# Patient Record
Sex: Female | Born: 1965
Health system: Southern US, Community
[De-identification: ages and names within clinical notes are randomized; demographics above are authoritative.]

## PROBLEM LIST (undated history)

## (undated) DIAGNOSIS — C801 Malignant (primary) neoplasm, unspecified: Secondary | ICD-10-CM

## (undated) DIAGNOSIS — F32A Depression, unspecified: Secondary | ICD-10-CM

## (undated) DIAGNOSIS — I341 Nonrheumatic mitral (valve) prolapse: Secondary | ICD-10-CM

## (undated) DIAGNOSIS — C50411 Malignant neoplasm of upper-outer quadrant of right female breast: Principal | ICD-10-CM

## (undated) DIAGNOSIS — T8859XA Other complications of anesthesia, initial encounter: Secondary | ICD-10-CM

## (undated) DIAGNOSIS — F419 Anxiety disorder, unspecified: Secondary | ICD-10-CM

## (undated) DIAGNOSIS — F329 Major depressive disorder, single episode, unspecified: Secondary | ICD-10-CM

## (undated) DIAGNOSIS — N390 Urinary tract infection, site not specified: Secondary | ICD-10-CM

## (undated) DIAGNOSIS — T4145XA Adverse effect of unspecified anesthetic, initial encounter: Secondary | ICD-10-CM

## (undated) DIAGNOSIS — T7840XA Allergy, unspecified, initial encounter: Secondary | ICD-10-CM

## (undated) DIAGNOSIS — K219 Gastro-esophageal reflux disease without esophagitis: Secondary | ICD-10-CM

## (undated) DIAGNOSIS — D219 Benign neoplasm of connective and other soft tissue, unspecified: Secondary | ICD-10-CM

## (undated) DIAGNOSIS — C50919 Malignant neoplasm of unspecified site of unspecified female breast: Secondary | ICD-10-CM

## (undated) DIAGNOSIS — B999 Unspecified infectious disease: Secondary | ICD-10-CM

## (undated) DIAGNOSIS — G709 Myoneural disorder, unspecified: Secondary | ICD-10-CM

## (undated) HISTORY — DX: Malignant neoplasm of upper-outer quadrant of right female breast: C50.411

## (undated) HISTORY — PX: OOPHORECTOMY: SHX86

## (undated) HISTORY — PX: ABDOMINAL HYSTERECTOMY: SHX81

## (undated) HISTORY — DX: Depression, unspecified: F32.A

## (undated) HISTORY — DX: Major depressive disorder, single episode, unspecified: F32.9

---

## 2002-12-11 HISTORY — PX: BREAST SURGERY: SHX581

## 2014-04-15 ENCOUNTER — Other Ambulatory Visit: Payer: Self-pay

## 2014-04-15 ENCOUNTER — Encounter (HOSPITAL_COMMUNITY): Payer: Self-pay | Admitting: Emergency Medicine

## 2014-04-15 ENCOUNTER — Emergency Department (HOSPITAL_COMMUNITY)
Admission: EM | Admit: 2014-04-15 | Discharge: 2014-04-15 | Disposition: A | Discharge status: Home / Self Care | Payer: Medicaid - Out of State | Attending: Emergency Medicine | Admitting: Emergency Medicine

## 2014-04-15 DIAGNOSIS — Z859 Personal history of malignant neoplasm, unspecified: Secondary | ICD-10-CM | POA: Insufficient documentation

## 2014-04-15 DIAGNOSIS — R109 Unspecified abdominal pain: Secondary | ICD-10-CM | POA: Insufficient documentation

## 2014-04-15 DIAGNOSIS — M545 Low back pain, unspecified: Secondary | ICD-10-CM | POA: Insufficient documentation

## 2014-04-15 DIAGNOSIS — R42 Dizziness and giddiness: Secondary | ICD-10-CM | POA: Insufficient documentation

## 2014-04-15 DIAGNOSIS — R55 Syncope and collapse: Secondary | ICD-10-CM | POA: Insufficient documentation

## 2014-04-15 DIAGNOSIS — R52 Pain, unspecified: Secondary | ICD-10-CM | POA: Insufficient documentation

## 2014-04-15 DIAGNOSIS — M5430 Sciatica, unspecified side: Secondary | ICD-10-CM | POA: Insufficient documentation

## 2014-04-15 HISTORY — DX: Malignant (primary) neoplasm, unspecified: C80.1

## 2014-04-15 LAB — BASIC METABOLIC PANEL
BUN: 7 mg/dL (ref 6–23)
CALCIUM: 9.6 mg/dL (ref 8.4–10.5)
CO2: 25 mEq/L (ref 19–32)
Chloride: 100 mEq/L (ref 96–112)
Creatinine, Ser: 0.58 mg/dL (ref 0.50–1.10)
GFR calc Af Amer: >90 mL/min (ref >90)
GFR calc non Af Amer: >90 mL/min (ref >90)
Glucose, Bld: 118 mg/dL — ABNORMAL HIGH (ref 70–99)
Potassium: 3.9 mEq/L (ref 3.7–5.3)
Sodium: 141 mEq/L (ref 137–147)

## 2014-04-15 LAB — CBC
HCT: 40.6 % (ref 36.0–46.0)
Hemoglobin: 13.9 g/dL (ref 12.0–15.0)
MCH: 31.1 pg (ref 26.0–34.0)
MCHC: 34.2 g/dL (ref 30.0–36.0)
MCV: 90.8 fL (ref 78.0–100.0)
PLATELETS: 325 10*3/uL (ref 150–400)
RBC: 4.47 MIL/uL (ref 3.87–5.11)
RDW: 13.5 % (ref 11.5–15.5)
WBC: 8.6 10*3/uL (ref 4.0–10.5)

## 2014-04-15 LAB — I-STAT TROPONIN, ED: Troponin i, poc: 0 ng/mL (ref 0.00–0.08)

## 2014-04-15 MED ORDER — PREDNISONE 20 MG PO TABS: ORAL_TABLET | ORAL | Status: DC

## 2014-04-15 MED ORDER — HYDROCODONE-ACETAMINOPHEN 5-325 MG PO TABS: 2.0000 | ORAL_TABLET | ORAL | Status: DC | PRN

## 2014-04-15 MED ORDER — IBUPROFEN 800 MG PO TABS: 800.0000 mg | ORAL_TABLET | Freq: Three times a day (TID) | ORAL | Status: DC

## 2014-04-15 NOTE — ED Provider Notes (Signed)
CSN: [id number]     Arrival date & time 04/15/14  1352 History   First MD Initiated Contact with Patient 04/15/14 1805     Chief Complaint  Patient presents with  . Loss of Consciousness     (Consider location/radiation/quality/duration/timing/severity/associated sxs/prior Treatment) HPI Comments: Patient presents to the ER for evaluation of syncope. Patient reports that she has had ongoing problems with left sided sciatica. Pain became suddenly very severe while she was waiting at the bus stop. She started to have dizziness, felt like she was going to pass out. She then noticed that she had some rumbling and discomfort in the stomach area. The next thing she knew, she woke up on the ground.  The patient reports that she did not suffer any head injury, has no headache. No vision change. No neck or back pain. Patient reports mild to moderate pain in the hip now, it is much improved. No further abdominal pain. She has not had any change in bowel or bladder function. No numbness, weakness in the lower extremities.  Patient is a 48 y.o. female presenting with syncope.  Loss of Consciousness   Past Medical History  Diagnosis Date  . Cancer    Past Surgical History  Procedure Laterality Date  . Abdominal hysterectomy     History reviewed. No pertinent family history. History  Substance Use Topics  . Smoking status: Never Smoker   . Smokeless tobacco: Not on file  . Alcohol Use: Yes   OB History   Grav Para Term Preterm Abortions TAB SAB Ect Mult Living                 Review of Systems  Cardiovascular: Positive for syncope.  Gastrointestinal: Positive for abdominal pain.  Musculoskeletal: Positive for back pain.  Neurological: Positive for syncope.  All other systems reviewed and are negative.     Allergies  Percocet  Home Medications   Prior to Admission medications   Medication Sig Start Date End Date Taking? Authorizing Provider  HYDROcodone-acetaminophen  (NORCO/VICODIN) 5-325 MG per tablet Take 2 tablets by mouth every 4 (four) hours as needed for moderate pain. 04/15/14   Orpah Greek, MD  ibuprofen (ADVIL,MOTRIN) 800 MG tablet Take 1 tablet (800 mg total) by mouth 3 (three) times daily. 04/15/14   Orpah Greek, MD  predniSONE (DELTASONE) 20 MG tablet 3 tabs po daily x 3 days, then 2 tabs x 3 days, then 1.5 tabs x 3 days, then 1 tab x 3 days, then 0.5 tabs x 3 days 04/15/14   Orpah Greek, MD   BP 118/70  Pulse 99  Temp(Src) 98 F (36.7 C) (Oral)  Resp 16  Wt 156 lb 9 oz (71.016 kg)  SpO2 99% Physical Exam  Constitutional: She is oriented to person, place, and time. She appears well-developed and well-nourished. No distress.  HENT:  Head: Normocephalic and atraumatic.  Right Ear: Hearing normal.  Left Ear: Hearing normal.  Nose: Nose normal.  Mouth/Throat: Oropharynx is clear and moist and mucous membranes are normal.  Eyes: Conjunctivae and EOM are normal. Pupils are equal, round, and reactive to light.  Neck: Normal range of motion. Neck supple.  Cardiovascular: Regular rhythm, S1 normal and S2 normal.  Exam reveals no gallop and no friction rub.   No murmur heard. Pulmonary/Chest: Effort normal and breath sounds normal. No respiratory distress. She exhibits no tenderness.  Abdominal: Soft. Normal appearance and bowel sounds are normal. There is no hepatosplenomegaly. There is no  tenderness. There is no rebound, no guarding, no tenderness at McBurney's point and negative Murphy's sign. No hernia.  Musculoskeletal: Normal range of motion.       Lumbar back: She exhibits tenderness. She exhibits no bony tenderness.       Back:  Neurological: She is alert and oriented to person, place, and time. She has normal strength. No cranial nerve deficit or sensory deficit. Coordination normal. GCS eye subscore is 4. GCS verbal subscore is 5. GCS motor subscore is 6.  Reflex Scores:      Patellar reflexes are 1+ on the  right side and 1+ on the left side. Skin: Skin is warm, dry and intact. No rash noted. No cyanosis.  Psychiatric: She has a normal mood and affect. Her speech is normal and behavior is normal. Thought content normal.    ED Course  Procedures (including critical care time) Labs Review Labs Reviewed  BASIC METABOLIC PANEL - Abnormal; Notable for the following:    Glucose, Bld 118 (*)    All other components within normal limits  CBC  I-STAT TROPOININ, ED    Imaging Review No results found.   EKG Interpretation None      Date: 04/15/2014  Rate: 82  Rhythm: normal sinus rhythm  QRS Axis: normal  Intervals: normal  ST/T Wave abnormalities: normal  Conduction Disutrbances:none     MDM   Final diagnoses:  Vasovagal syncope  Sciatica    Patient presents to the ER for evaluation of syncope. She had a syncopal episode today after experiencing sudden acute pain in her back. Her lab work is normal. Vital signs are normal. Neurologic evaluation is normal. No concern for head injury. I do not feel that she needs a CT. Cardiac Workup normal as well.  Symptoms are very consistent with a vasovagal syncope in the setting of acute pain. She does have symptoms are consistent with sciatica that has not been treated. She has normal strength, sensation and reflexes in the lower extremity. Imaging necessary. Patient was treated with Vicodin. She reports a history of itching with Percocet in the past, we'll try the Vicodin, stopped or any symptoms. Was also prescribed a prednisone taper.    Orpah Greek, MD 04/15/14 [phone]

## 2014-04-15 NOTE — ED Notes (Signed)
Per pt sts she had a syncopal episode at the bus stop today. Denies hitting head. sts right now she feels okay.

## 2014-04-15 NOTE — Discharge Instructions (Signed)
Sciatica °Sciatica is pain, weakness, numbness, or tingling along the path of the sciatic nerve. The nerve starts in the lower back and runs down the back of each leg. The nerve controls the muscles in the lower leg and in the back of the knee, while also providing sensation to the back of the thigh, lower leg, and the sole of your foot. Sciatica is a symptom of another medical condition. For instance, nerve damage or certain conditions, such as a herniated disk or bone spur on the spine, pinch or put pressure on the sciatic nerve. This causes the pain, weakness, or other sensations normally associated with sciatica. Generally, sciatica only affects one side of the body. °CAUSES  °· Herniated or slipped disc. °· Degenerative disk disease. °· A pain disorder involving the narrow muscle in the buttocks (piriformis syndrome). °· Pelvic injury or fracture. °· Pregnancy. °· Tumor (rare). °SYMPTOMS  °Symptoms can vary from mild to very severe. The symptoms usually travel from the low back to the buttocks and down the back of the leg. Symptoms can include: °· Mild tingling or dull aches in the lower back, leg, or hip. °· Numbness in the back of the calf or sole of the foot. °· Burning sensations in the lower back, leg, or hip. °· Sharp pains in the lower back, leg, or hip. °· Leg weakness. °· Severe back pain inhibiting movement. °These symptoms may get worse with coughing, sneezing, laughing, or prolonged sitting or standing. Also, being overweight may worsen symptoms. °DIAGNOSIS  °Your caregiver will perform a physical exam to look for common symptoms of sciatica. He or she may ask you to do certain movements or activities that would trigger sciatic nerve pain. Other tests may be performed to find the cause of the sciatica. These may include: °· Blood tests. °· X-rays. °· Imaging tests, such as an MRI or CT scan. °TREATMENT  °Treatment is directed at the cause of the sciatic pain. Sometimes, treatment is not necessary  and the pain and discomfort goes away on its own. If treatment is needed, your caregiver may suggest: °· Over-the-counter medicines to relieve pain. °· Prescription medicines, such as anti-inflammatory medicine, muscle relaxants, or narcotics. °· Applying heat or ice to the painful area. °· Steroid injections to lessen pain, irritation, and inflammation around the nerve. °· Reducing activity during periods of pain. °· Exercising and stretching to strengthen your abdomen and improve flexibility of your spine. Your caregiver may suggest losing weight if the extra weight makes the back pain worse. °· Physical therapy. °· Surgery to eliminate what is pressing or pinching the nerve, such as a bone spur or part of a herniated disk. °HOME CARE INSTRUCTIONS  °· Only take over-the-counter or prescription medicines for pain or discomfort as directed by your caregiver. °· Apply ice to the affected area for 20 minutes, 3 4 times a day for the first 48 72 hours. Then try heat in the same way. °· Exercise, stretch, or perform your usual activities if these do not aggravate your pain. °· Attend physical therapy sessions as directed by your caregiver. °· Keep all follow-up appointments as directed by your caregiver. °· Do not wear high heels or shoes that do not provide proper support. °· Check your mattress to see if it is too soft. A firm mattress may lessen your pain and discomfort. °SEEK IMMEDIATE MEDICAL CARE IF:  °· You lose control of your bowel or bladder (incontinence). °· You have increasing weakness in the lower back,   pelvis, buttocks, or legs.  You have redness or swelling of your back.  You have a burning sensation when you urinate.  You have pain that gets worse when you lie down or awakens you at night.  Your pain is worse than you have experienced in the past.  Your pain is lasting longer than 4 weeks.  You are suddenly losing weight without reason. MAKE SURE YOU:  Understand these  instructions.  Will watch your condition.  Will get help right away if you are not doing well or get worse. Document Released: 11/21/2001 Document Revised: 05/28/2012 Document Reviewed: 04/07/2012 [facility] Patient Information 2014 [place].  Syncope Syncope is a fainting spell. This means the person loses consciousness and drops to the ground. The person is generally unconscious for less than 5 minutes. The person may have some muscle twitches for up to 15 seconds before waking up and returning to normal. Syncope occurs more often in elderly people, but it can happen to anyone. While most causes of syncope are not dangerous, syncope can be a sign of a serious medical problem. It is important to seek medical care.  CAUSES  Syncope is caused by a sudden decrease in blood flow to the brain. The specific cause is often not determined. Factors that can trigger syncope include:  Taking medicines that lower blood pressure.  Sudden changes in posture, such as standing up suddenly.  Taking more medicine than prescribed.  Standing in one place for too long.  Seizure disorders.  Dehydration and excessive exposure to heat.  Low blood sugar (hypoglycemia).  Straining to have a bowel movement.  Heart disease, irregular heartbeat, or other circulatory problems.  Fear, emotional distress, seeing blood, or severe pain. SYMPTOMS  Right before fainting, you may:  Feel dizzy or lightheaded.  Feel nauseous.  See all white or all black in your field of vision.  Have cold, clammy skin. DIAGNOSIS  Your caregiver will ask about your symptoms, perform a physical exam, and perform electrocardiography (ECG) to record the electrical activity of your heart. Your caregiver may also perform other heart or blood tests to determine the cause of your syncope. TREATMENT  In most cases, no treatment is needed. Depending on the cause of your syncope, your caregiver may recommend changing or stopping  some of your medicines. HOME CARE INSTRUCTIONS  Have someone stay with you until you feel stable.  Do not drive, operate machinery, or play sports until your caregiver says it is okay.  Keep all follow-up appointments as directed by your caregiver.  Lie down right away if you start feeling like you might faint. Breathe deeply and steadily. Wait until all the symptoms have passed.  Drink enough fluids to keep your urine clear or pale yellow.  If you are taking blood pressure or heart medicine, get up slowly, taking several minutes to sit and then stand. This can reduce dizziness. SEEK IMMEDIATE MEDICAL CARE IF:   You have a severe headache.  You have unusual pain in the chest, abdomen, or back.  You are bleeding from the mouth or rectum, or you have black or tarry stool.  You have an irregular or very fast heartbeat.  You have pain with breathing.  You have repeated fainting or seizure-like jerking during an episode.  You faint when sitting or lying down.  You have confusion.  You have difficulty walking.  You have severe weakness.  You have vision problems. If you fainted, call your local emergency services (911 in [place]  Do not drive yourself to the hospital.  MAKE SURE YOU:  Understand these instructions.  Will watch your condition.  Will get help right away if you are not doing well or get worse. Document Released: 11/27/2005 Document Revised: 05/28/2012 Document Reviewed: 01/26/2012 [facility] Patient Information 2014 [place].

## 2014-09-29 ENCOUNTER — Inpatient Hospital Stay (HOSPITAL_COMMUNITY)
Admission: AD | Admit: 2014-09-29 | Discharge: 2014-09-29 | Disposition: A | Discharge status: Home / Self Care | Payer: Medicaid - Out of State | Source: Ambulatory Visit | Attending: Obstetrics & Gynecology | Admitting: Obstetrics & Gynecology

## 2014-09-29 ENCOUNTER — Encounter (HOSPITAL_COMMUNITY): Payer: Self-pay | Admitting: *Deleted

## 2014-09-29 DIAGNOSIS — Z90710 Acquired absence of both cervix and uterus: Secondary | ICD-10-CM | POA: Insufficient documentation

## 2014-09-29 DIAGNOSIS — K219 Gastro-esophageal reflux disease without esophagitis: Secondary | ICD-10-CM | POA: Insufficient documentation

## 2014-09-29 HISTORY — DX: Unspecified infectious disease: B99.9

## 2014-09-29 HISTORY — DX: Nonrheumatic mitral (valve) prolapse: I34.1

## 2014-09-29 HISTORY — DX: Anxiety disorder, unspecified: F41.9

## 2014-09-29 HISTORY — DX: Other complications of anesthesia, initial encounter: T88.59XA

## 2014-09-29 HISTORY — DX: Adverse effect of unspecified anesthetic, initial encounter: T41.45XA

## 2014-09-29 HISTORY — DX: Benign neoplasm of connective and other soft tissue, unspecified: D21.9

## 2014-09-29 LAB — URINALYSIS, ROUTINE W REFLEX MICROSCOPIC
BILIRUBIN URINE: NEGATIVE
Glucose, UA: NEGATIVE mg/dL
Hgb urine dipstick: NEGATIVE
Ketones, ur: NEGATIVE mg/dL
Leukocytes, UA: NEGATIVE
NITRITE: NEGATIVE
PH: 5.5 (ref 5.0–8.0)
Protein, ur: NEGATIVE mg/dL
Specific Gravity, Urine: >1.03 — ABNORMAL HIGH (ref 1.005–1.030)
Urobilinogen, UA: 0.2 mg/dL (ref 0.0–1.0)

## 2014-09-29 LAB — CBC WITH DIFFERENTIAL/PLATELET
Basophils Absolute: 0 10*3/uL (ref 0.0–0.1)
Basophils Relative: 0 % (ref 0–1)
EOS ABS: 0.1 10*3/uL (ref 0.0–0.7)
Eosinophils Relative: 1 % (ref 0–5)
HCT: 42.3 % (ref 36.0–46.0)
Hemoglobin: 14.5 g/dL (ref 12.0–15.0)
LYMPHS ABS: 2.4 10*3/uL (ref 0.7–4.0)
LYMPHS PCT: 24 % (ref 12–46)
MCH: 30.9 pg (ref 26.0–34.0)
MCHC: 34.3 g/dL (ref 30.0–36.0)
MCV: 90.2 fL (ref 78.0–100.0)
MONOS PCT: 5 % (ref 3–12)
Monocytes Absolute: 0.5 10*3/uL (ref 0.1–1.0)
NEUTROS PCT: 70 % (ref 43–77)
Neutro Abs: 6.7 10*3/uL (ref 1.7–7.7)
Platelets: 353 10*3/uL (ref 150–400)
RBC: 4.69 MIL/uL (ref 3.87–5.11)
RDW: 13.6 % (ref 11.5–15.5)
WBC: 9.7 10*3/uL (ref 4.0–10.5)

## 2014-09-29 LAB — COMPREHENSIVE METABOLIC PANEL
ALT: 16 U/L (ref 0–35)
ANION GAP: 13 (ref 5–15)
AST: 18 U/L (ref 0–37)
Albumin: 4.4 g/dL (ref 3.5–5.2)
Alkaline Phosphatase: 100 U/L (ref 39–117)
BILIRUBIN TOTAL: 0.8 mg/dL (ref 0.3–1.2)
BUN: 7 mg/dL (ref 6–23)
CHLORIDE: 100 meq/L (ref 96–112)
CO2: 27 meq/L (ref 19–32)
Calcium: 9.7 mg/dL (ref 8.4–10.5)
Creatinine, Ser: 0.56 mg/dL (ref 0.50–1.10)
GLUCOSE: 99 mg/dL (ref 70–99)
POTASSIUM: 4.2 meq/L (ref 3.7–5.3)
Sodium: 140 mEq/L (ref 137–147)
TOTAL PROTEIN: 8.5 g/dL — AB (ref 6.0–8.3)

## 2014-09-29 LAB — AMYLASE: Amylase: 60 U/L (ref 0–105)

## 2014-09-29 LAB — LIPASE, BLOOD: Lipase: 29 U/L (ref 11–59)

## 2014-09-29 MED ORDER — OMEPRAZOLE 20 MG PO CPDR: 20.0000 mg | DELAYED_RELEASE_CAPSULE | Freq: Every day | ORAL | Status: DC

## 2014-09-29 MED ORDER — GI COCKTAIL ~~LOC~~
30.0000 mL | Freq: Once | ORAL | Status: AC
  Administered 2014-09-29: 30 mL via ORAL
  Filled 2014-09-29: qty 30

## 2014-09-29 NOTE — MAU Provider Note (Signed)
History     CSN: [id number]  Arrival date and time: 09/29/14 1223   First Provider Initiated Contact with Patient 09/29/14 1304      Chief Complaint  Patient presents with  . Abdominal Pain   HPI Ms. Kallista Rumpf is a 48 y.o. [id number] who presents to MAU today with complaint of epigastric pain that comes and goes x weeks. She denies food triggers, fever, vaginal bleeding, discharge, UTI symptoms or N/V. She states that she occasionally feels acid in the back of her throat. The patient had hysterectomy without complications 2-3 years ago. She has not tried any medication for her pain.   OB History   Grav Para Term Preterm Abortions TAB SAB Ect Mult Living   2 2 1 1  0 0 0 0 0 2      Past Medical History  Diagnosis Date  . Cancer   . Complication of anesthesia     slow to wake up- BP was low, fainted next day  . MVP (mitral valve prolapse)   . Infection     UTI  . Anxiety   . Fibroid     Past Surgical History  Procedure Laterality Date  . Abdominal hysterectomy    . Breast surgery  2004    left lumpectomy    Family History  Problem Relation Age of Onset  . Hypertension Mother   . Stroke Mother   . Heart disease Father   . Stroke Father     History  Substance Use Topics  . Smoking status: Never Smoker   . Smokeless tobacco: Never Used  . Alcohol Use: Yes     Comment: 2-3/wk    Allergies:  Allergies  Allergen Reactions  . Percocet [Oxycodone-Acetaminophen] Itching    No prescriptions prior to admission    Review of Systems  Constitutional: Negative for fever and malaise/fatigue.  Gastrointestinal: Positive for abdominal pain. Negative for nausea and vomiting.  Genitourinary: Negative for dysuria, urgency and frequency.       Neg - vaginal bleeding, discharge   Physical Exam   Blood pressure 132/97, pulse 87, temperature 98.5 F (36.9 C), temperature source Oral, resp. rate 18, height 5\' 7"  (1.702 m), weight 156 lb (70.761 kg).  Physical Exam   Constitutional: She is oriented to person, place, and time. She appears well-developed and well-nourished. No distress.  HENT:  Head: Normocephalic.  Cardiovascular: Normal rate.   Respiratory: Effort normal.  GI: Soft. She exhibits no distension and no mass. There is tenderness (mild epigastric tenderness to palpation at midline, just below the xyphoid process). There is no rebound and no guarding.  Neurological: She is alert and oriented to person, place, and time.  Skin: Skin is warm and dry. No erythema.  Psychiatric: She has a normal mood and affect.   Results for orders placed during the hospital encounter of 09/29/14 (from the past 24 hour(s))  URINALYSIS, ROUTINE W REFLEX MICROSCOPIC     Status: Abnormal   Collection Time    09/29/14 12:45 PM      Result Value Ref Range   Color, Urine YELLOW  YELLOW   APPearance CLEAR  CLEAR   Specific Gravity, Urine >1.030 (*) 1.005 - 1.030   pH 5.5  5.0 - 8.0   Glucose, UA NEGATIVE  NEGATIVE mg/dL   Hgb urine dipstick NEGATIVE  NEGATIVE   Bilirubin Urine NEGATIVE  NEGATIVE   Ketones, ur NEGATIVE  NEGATIVE mg/dL   Protein, ur NEGATIVE  NEGATIVE mg/dL   Urobilinogen,  UA 0.2  0.0 - 1.0 mg/dL   Nitrite NEGATIVE  NEGATIVE   Leukocytes, UA NEGATIVE  NEGATIVE  CBC WITH DIFFERENTIAL     Status: None   Collection Time    09/29/14  1:25 PM      Result Value Ref Range   WBC 9.7  4.0 - 10.5 K/uL   RBC 4.69  3.87 - 5.11 MIL/uL   Hemoglobin 14.5  12.0 - 15.0 g/dL   HCT 42.3  36.0 - 46.0 %   MCV 90.2  78.0 - 100.0 fL   MCH 30.9  26.0 - 34.0 pg   MCHC 34.3  30.0 - 36.0 g/dL   RDW 13.6  11.5 - 15.5 %   Platelets 353  150 - 400 K/uL   Neutrophils Relative % 70  43 - 77 %   Neutro Abs 6.7  1.7 - 7.7 K/uL   Lymphocytes Relative 24  12 - 46 %   Lymphs Abs 2.4  0.7 - 4.0 K/uL   Monocytes Relative 5  3 - 12 %   Monocytes Absolute 0.5  0.1 - 1.0 K/uL   Eosinophils Relative 1  0 - 5 %   Eosinophils Absolute 0.1  0.0 - 0.7 K/uL   Basophils Relative  0  0 - 1 %   Basophils Absolute 0.0  0.0 - 0.1 K/uL  COMPREHENSIVE METABOLIC PANEL     Status: Abnormal   Collection Time    09/29/14  1:25 PM      Result Value Ref Range   Sodium 140  137 - 147 mEq/L   Potassium 4.2  3.7 - 5.3 mEq/L   Chloride 100  96 - 112 mEq/L   CO2 27  19 - 32 mEq/L   Glucose, Bld 99  70 - 99 mg/dL   BUN 7  6 - 23 mg/dL   Creatinine, Ser 0.56  0.50 - 1.10 mg/dL   Calcium 9.7  8.4 - 10.5 mg/dL   Total Protein 8.5 (*) 6.0 - 8.3 g/dL   Albumin 4.4  3.5 - 5.2 g/dL   AST 18  0 - 37 U/L   ALT 16  0 - 35 U/L   Alkaline Phosphatase 100  39 - 117 U/L   Total Bilirubin 0.8  0.3 - 1.2 mg/dL   GFR calc non Af Amer >90  >90 mL/min   GFR calc Af Amer >90  >90 mL/min   Anion gap 13  5 - 15    MAU Course  Procedures None  MDM GI cocktail given CBC, CMP, amylase and lipase today BP slightly elevated at time of discharge. Patient denies headache, blurred vision or chest pain. Denis history of HTN.  CVA and HTN warning signs discussed. Patient advised to go to [facility] immediately if symptoms arise  Assessment and Plan  A: Acid reflux  P: Discharge home Rx for Prilosec given to patient Patient advised that Prilosec is also available OTC if needed Amylase and lipase pending Patient given list of area resources for PCP and advised to establish care ASAP Patient may return to MAU as needed or if her condition were to change or worsen  Luvenia Redden, PA-C  09/29/2014, 2:30 PM

## 2014-09-29 NOTE — Discharge Instructions (Signed)
Food Choices for Gastroesophageal Reflux Disease When you have gastroesophageal reflux disease (GERD), the foods you eat and your eating habits are very important. Choosing the right foods can help ease the discomfort of GERD. WHAT GENERAL GUIDELINES DO I NEED TO FOLLOW?  Choose fruits, vegetables, whole grains, low-fat dairy products, and low-fat meat, fish, and poultry.  Limit fats such as oils, salad dressings, butter, nuts, and avocado.  Keep a food diary to identify foods that cause symptoms.  Avoid foods that cause reflux. These may be different for different people.  Eat frequent small meals instead of three large meals each day.  Eat your meals slowly, in a relaxed setting.  Limit fried foods.  Cook foods using methods other than frying.  Avoid drinking alcohol.  Avoid drinking large amounts of liquids with your meals.  Avoid bending over or lying down until 2-3 hours after eating. WHAT FOODS ARE NOT RECOMMENDED? The following are some foods and drinks that may worsen your symptoms: Vegetables Tomatoes. Tomato juice. Tomato and spaghetti sauce. Chili peppers. Onion and garlic. Horseradish. Fruits Oranges, grapefruit, and lemon (fruit and juice). Meats High-fat meats, fish, and poultry. This includes hot dogs, ribs, ham, sausage, salami, and bacon. Dairy Whole milk and chocolate milk. Sour cream. Cream. Butter. Ice cream. Cream cheese.  Beverages Coffee and tea, with or without caffeine. Carbonated beverages or energy drinks. Condiments Hot sauce. Barbecue sauce.  Sweets/Desserts Chocolate and cocoa. Donuts. Peppermint and spearmint. Fats and Oils High-fat foods, including [place] fries and potato chips. Other Vinegar. Strong spices, such as black pepper, white pepper, red pepper, cayenne, curry powder, cloves, ginger, and chili powder. The items listed above may not be a complete list of foods and beverages to avoid. Contact your dietitian for more  information. Document Released: 11/27/2005 Document Revised: 12/02/2013 Document Reviewed: 10/01/2013 [facility] Patient Information 2015 [place], [place]. This information is not intended to replace advice given to you by your health care provider. Make sure you discuss any questions you have with your health care provider.  [place] (Revised August 2014)   Chronic Pain Problems:    [facility]:  [phone]           Patients need to be referred by their primary care doctor/specialist  Insufficient Money for Medicine:           United Way: call "211"     MAP Program at [place] or HP [phone]            No Primary Care Doctor:  To locate a primary care doctor that accepts your insurance or provides certain services:           Reserve: [phone]           Physician Referral Service: [phone] ask for My [place]   If no insurance, you need to see if you qualify for [facility] orange card, call to set      up appointment for eligibility/enrollment at [phone] or [phone] or visit [place] ([address], [place] and [place]) to meet with a [facility] enrollment specialist.  Agencies that provide inexpensive (sliding fee scale) medical care:        [facility] - Family Medicine at [place] - [phone]      [facility] and [place] - [phone]  [facility] Internal Medicine - [phone]      [place] [phone]      [facility] [place] [phone]   [facility] - [place] @ [place] [phone]   [facility] - [place] @ [place] - [phone]   [facility] Family Practice: [phone]     Women's Clinic: [phone]    Planned Parenthood: [phone]    [facility] [place]    [place] Providers:           [facility] - [phone] (No Family Planning accepted)          [address], [phone], Mon-Fri 9am-5pm          [place] [phone]   [place], [address] [place], Mon-Thursday 8am-5pm, Fri 8am-noon   [place]          [address], Mon-Fri 7:30am-4:30pm          [place] - [phone]          [address] [facility] - [phone] [address]          Only accepts [place] [employer] patients after they have their name applied to their card  Self Pay (no insurance) in [facility]:           Sickle Cell Patients:    [place], [phone] [facility] Internal Medicine:   [address] [place] [phone]       [facility]   [address], [place] [phone]  [facility] Family Practice:   [address] [phone]          [facility]           [place], [phone] [facility] for [place], [phone]           [facility] Urgent [place]           [place] [address], [place] [address]           [phone], Mon-Fri 9am-7pm, [place] 9am-1pm          [facility] - Family Medicine @ [facility]          [place], [place]          [facility] - [facility]           [address], [place] [facility] and [place]   [address] [place] [phone]          [place], [place]  [facility] - [place]    [phone]  [place], [phone] [facility] - [place]   [address], [phone]  Dr. Vista Lawman           [address], [place], [facility]           [address] [phone]          [facility]             [address] [phone]          [facility]           [place], [place], 1st & 3rd Saturday every month, 10am-1pm  OTHERS:  [facility]  ([facility] Only)  [phone] (Thursday only)  Strategies for finding a Primary Care Provider:  1) Find a Doctor and Pay Out of Pocket  Although you won't have to find out who is covered by your insurance plan, it is a good idea to ask around and get recommendations. You will then need to call the office and see if the doctor you have chosen will accept you as a new patient and what types of options they offer for patients who are self-pay. Some doctors offer discounts or will set up payment plans for their patients who do not have insurance, but you will need to ask so you aren't surprised when you get to your appointment.  2) [place] - To see if you qualify for orange card access to healthcare safety net providers.  Call for appointment for eligibility/enrollment at [phone] or [phone]. (Uninsured, 0-200% FPL, qualifying info)  Applicants for [facility] are first required to see if they are eligible to enroll in the [facility] Marketplace before enrolling in [facility] (and get an exemption if they are not).  [place] Criteria for acceptance is:    Proof of Firefighter exemption - form or documentation    Valid photo ID (driver's license, state identification card, passport, home country ID)    Proof of [facility] residency (e.g.  drivers license, lease/landlord information, pay stubs with address, utility bill, bank statement, etc.)    Proof of income (1040, last year's tax return, W2, 4 current pay stubs, other income proof)    Proof of assets (current bank statement + 3 most recent, disability paperwork, life insurance info, tax value on autos, etc.)  3) [place] Department  Not all health departments have doctors that can see patients for sick visits, but many do, so it is worth a call to see if yours does. If you don't know where your local health department is, you can check in your phone book. The CDC also has a tool to help you locate your state's health department, and many state websites also have listings of all of their local health departments.  4) Find a [facility]  If your illness is not likely to be very severe or complicated, you may want to try a walk in clinic. These are popping up all over the country in pharmacies, drugstores, and shopping centers. They're usually staffed by nurse practitioners or physician assistants that have been trained to treat common illnesses and complaints. They're usually fairly quick and inexpensive. However, if you have serious medical issues or chronic medical problems, these are probably not your best option   STD Testing:  [place], [place] [facility]           [address], [place], phone [phone] or [phone]           Monday - Friday, call for an appointment          [place], [place] [facility]           [address], [place], phone [phone] or [phone]           Monday - Friday, call for an appointment Abuse/Neglect:           [place]: [place]: [phone] (After Hours)  Emergency Shelter:  [facility] [phone]  [place]-  [phone]  [place] - [phone]  Youth Focus - Act Together - [phone] (ages 25-17)  [place] @ [employer] - [phone]   Mammograms - Free at [facility] - [place]:           Room at the [place]: [phone]   (Homeless mother with children)          [place]: [phone] (Mothers only)   Youth Focus: [phone] (Pregnant 46-36 years old)   Adopt a Mom [phone]  [facility]    [facility]   [address] [place] [phone]          [facility]           [address] [place], [place], [place]          [facility] Dept.           [place], [place]          [facility]           [phone]          [facility] in [place]           [phone], [place]           [phone]           [phone] (After Hours)  [place] Abuse Resources:           Alcohol and Drug Services: [phone]           [facility]: [place]: [phone]    Narcotics Helpline - [phone]          [facility]: [phone]  Residential & Outpatient Substance Abuse Program - [place]: [phone]   [employer]  [place] - [phone] Psychological Services:          Reserve: [phone]    Therapeutic Alternatives: [phone]          [place]           [address]: [phone]     (24 Hour)   Mobile Crisis:    [place]:  Radio producer on [place] [phone] [facility] on [place] [phone]   Walk In [place]  ([place] - [phone] or [phone]  [place]. [place] [phone]  [place] [address] [place] [phone]   Dental Assistance:  If unable to pay or uninsured, contact: [facility]. to become qualified for the adult dental clinic. Patient must be enrolled in [employer] (uninsured, 0-200% FPL, qualifying info).  Enroll in [facility] first, then see Primary Care Physician assigned to you, the PCP makes a dental referral. [place] [facility] will receive referral and contacts patient for appointment.  Patients with Medicaid           [address] [place] (Children up to 73 + Pregnant Women) - [phone]  [place] - Suite [phone]  If unable to pay, or uninsured: contact [place] [phone] in [place] - ([place] only + Pregnant Women), [phone] in [place] only) to become qualified for the adult dental clinic  Must see if eligible to enroll in [place] before enrolling into the [facility] (exemption required) [phone] for an appointment)  [email or web address];   [phone].  If not eligible for ACA, then go by Department of Health and Human Services to see if eligible for orange card.  [address] [place].  Once you get an orange card, you will have a Primary Care home who will then refer you to dental if needed.        Other Personal assistant:   [facility] [phone] (ext [phone])   [address]  Dr. Donn Pierini - [phone]   [place]   [facility]           [place], [place], [place], [address]           [phone], Ext. [phone]           2nd and 4th Thursday of the month at 6:30am (Simple extractions only - no wisdom teeth or surgery) First come/First serve -First 10 clients served           [facility] [place], [place] and [facility] residents only)          [address], [place], [place], [address]           [phone]  [place]          [place]         [facility]          [phone]   Transportation Options:  Ambulance - 911 - [phone] per ride Family Member to accompany patient (if stable) - [place] - [phone]  PART - [phone]  Taxi - [phone] - [place] [phone] (Application required)  [facility] - [phone]

## 2014-09-29 NOTE — MAU Provider Note (Signed)
Attestation of Attending Supervision of Advanced Practitioner (PA/CNM/NP): Evaluation and management procedures were performed by the Advanced Practitioner under my supervision and collaboration.  I have reviewed the Advanced Practitioner's note and chart, and I agree with the management and plan.  Ashey Tramontana, MD, FACOG Attending Obstetrician & Gynecologist Faculty Practice, [facility]   

## 2014-09-29 NOTE — MAU Note (Signed)
Pain in upper abd.  Had this pain a few months ago, went away and now is back.   Has watery, soft bowels.  Doesn't really have an appetite. Is a sharp pain

## 2014-10-12 ENCOUNTER — Encounter (HOSPITAL_COMMUNITY): Payer: Self-pay | Admitting: *Deleted

## 2015-05-23 ENCOUNTER — Encounter (HOSPITAL_COMMUNITY): Payer: Self-pay

## 2015-05-23 ENCOUNTER — Emergency Department (HOSPITAL_COMMUNITY)
Admission: EM | Admit: 2015-05-23 | Discharge: 2015-05-23 | Disposition: A | Discharge status: Home / Self Care | Payer: BLUE CROSS/BLUE SHIELD | Attending: Emergency Medicine | Admitting: Emergency Medicine

## 2015-05-23 ENCOUNTER — Emergency Department (HOSPITAL_COMMUNITY): Payer: BLUE CROSS/BLUE SHIELD

## 2015-05-23 DIAGNOSIS — Z8679 Personal history of other diseases of the circulatory system: Secondary | ICD-10-CM | POA: Diagnosis not present

## 2015-05-23 DIAGNOSIS — M5412 Radiculopathy, cervical region: Secondary | ICD-10-CM | POA: Diagnosis not present

## 2015-05-23 DIAGNOSIS — Z79899 Other long term (current) drug therapy: Secondary | ICD-10-CM | POA: Insufficient documentation

## 2015-05-23 DIAGNOSIS — Z87440 Personal history of urinary (tract) infections: Secondary | ICD-10-CM | POA: Diagnosis not present

## 2015-05-23 DIAGNOSIS — M79602 Pain in left arm: Secondary | ICD-10-CM | POA: Diagnosis present

## 2015-05-23 DIAGNOSIS — Z853 Personal history of malignant neoplasm of breast: Secondary | ICD-10-CM | POA: Diagnosis not present

## 2015-05-23 DIAGNOSIS — Z8659 Personal history of other mental and behavioral disorders: Secondary | ICD-10-CM | POA: Diagnosis not present

## 2015-05-23 DIAGNOSIS — Z86018 Personal history of other benign neoplasm: Secondary | ICD-10-CM | POA: Insufficient documentation

## 2015-05-23 MED ORDER — PREDNISONE 20 MG PO TABS: ORAL_TABLET | ORAL | Status: DC

## 2015-05-23 NOTE — ED Provider Notes (Signed)
CSN: [id number]     Arrival date & time 05/23/15  1413 History   First MD Initiated Contact with Patient 05/23/15 1652    This chart was scribed for non-physician practitioner, Jamse Mead, [place], working with Pamella Pert, MD by Terressa Koyanagi, ED Scribe. This patient was seen in room TR10C/TR10C and the patient's care was started at 5:23 PM.  Chief Complaint  Patient presents with  . Arm Pain   The history is provided by the patient. No language interpreter was used.   PCP: Dr. Ronnald Ramp HPI Comments: Sandra James is a 49 y.o. female, with Hx of mitral valve prolapse, infection (UTI), anxiety, fibroid, cancer (breast cancer with left breast lumpectomy), and abd hysterectomy (consequently, no menstral periods), who presents to the Emergency Department complaining of ongoing, intermittent, atraumatic, shooting, worsening, radiating pain to left arm (starting at the left shoulder blade and radiating down the left arm to the fingers) onset 02/2015. Pt rates her pain a 7 out of 10.  Pt reports that she began to experience numbness and tingling to her left index, thumb, long finger this past March, however, recently the numbness/tingling progressed into intermittent episodes of pain. Pt reports she was seen by her PCP 2 days ago regarding the same whereby imaging was completed; she was Dx with muscle spasms in the neck; and started on ibuprofen and muscle relaxers with no relief. 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 Last menstrual period, patient reported that she had a  hysterectomy. PCP Dr. Raynelle Dick  Past Medical History  Diagnosis Date  . Complication of anesthesia     slow to wake up- BP was low, fainted next day  . MVP (mitral valve prolapse)   . Infection     UTI  . Anxiety   . Fibroid   . Cancer     left brast lumpectomy    Past Surgical History  Procedure Laterality Date  . Abdominal hysterectomy    . Breast surgery  2004    left lumpectomy   Family History  Problem Relation Age of Onset  . Hypertension Mother   . Stroke Mother   . Heart disease Father   . Stroke Father    History  Substance Use Topics  . Smoking status: Never Smoker   . Smokeless tobacco: Never Used  . Alcohol Use: Yes     Comment: 2-3/wk   OB History    Gravida Para Term Preterm AB TAB SAB Ectopic Multiple Living   2 2 1 1  0 0 0 0 0 2     Review of Systems  Constitutional: Negative for fever and chills.  Eyes: Negative for visual disturbance.  Respiratory: Negative for shortness of breath.   Cardiovascular: Negative for chest pain.  Musculoskeletal: Positive for arthralgias (left shoulder). Negative for neck pain and neck stiffness.       Left arm pain  Skin: Negative for color change and pallor.  Neurological: Positive for numbness (left arm). Negative for dizziness and syncope.       Denies complete loss of sensation to UE.  Allergies  Percocet  Home Medications   Prior to Admission medications   Medication Sig Start Date End Date Taking? Authorizing Provider  omeprazole (PRILOSEC) 20 MG capsule Take 1 capsule (20 mg total) by mouth daily. 09/29/14   Luvenia Redden, PA-C  predniSONE (DELTASONE) 20 MG tablet 3 tabs po day one, then 2 tabs daily x 4 days 05/23/15   Jamse Mead, PA-C   Triage Vitals: BP 130/89 mmHg  Pulse 93  Temp(Src) 98.7 F (37.1 C) (Oral)  Resp 20  Ht 5\' 5"  (1.651 m)  Wt 165 lb 9.6 oz (75.116 kg)  BMI 27.56 kg/m2  SpO2 96% Physical Exam  Constitutional: She is oriented to person, place, and time. She appears  well-developed and well-nourished. No distress.  HENT:  Head: Normocephalic and atraumatic.  Eyes: Conjunctivae and EOM are normal. Pupils are equal, round, and reactive to light. Right eye exhibits no discharge. Left eye exhibits no discharge.  Neck: Normal range of motion. Neck supple.  Negative pain upon palpation to the neck  Cardiovascular: Normal rate, regular rhythm and normal heart sounds.  Exam reveals no friction rub.   No murmur heard. Pulses:      Radial pulses are 2+ on the right side, and 2+ on the left side.  Cap refill < 3 seconds Negative left arm swelling or erythema, negative red streaks  Pulmonary/Chest: Effort normal and breath sounds normal. No respiratory distress. She has no wheezes. She has no rales.  Musculoskeletal:  Negative swelling, erythema, inflammation, lesions, sores, deformities, malalignment or sunken in appearance. Patient is full range of motion to left shoulder without difficulty or ataxia. Full range of motion to left upper extremity without difficulty.  Neurological: She is alert and oriented to person, place, and time. No cranial nerve deficit. She exhibits normal muscle tone. Coordination normal.  Cranial nerves III-XII grossly intact Strength 5+/5+ to upper extremities bilaterally with resistance applied, equal distribution noted Equal grip strength Strength intact to MCP, PIP, DIP joints of left hand Sensation intact with differentiation to sharp and dull touch Negative arm drift  Skin: Skin is warm and dry. No rash noted. She is not diaphoretic. No erythema.  Psychiatric: She has a normal mood and affect. Her behavior is normal. Thought content normal.  Nursing note and vitals reviewed.   ED Course  Procedures (including critical care time) DIAGNOSTIC STUDIES: Oxygen Saturation is 96% on RA, nl by my interpretation.    COORDINATION OF CARE: 5:32 PM-Discussed treatment plan with pt at bedside and pt agreed to plan.   Labs Review Labs  Reviewed - No data to display  Imaging Review Ct Cervical Spine Wo Contrast  05/23/2015   CLINICAL DATA:  Arm pain, neck strain March 2016  EXAM: CT CERVICAL SPINE WITHOUT CONTRAST  TECHNIQUE: Multidetector CT imaging of the cervical spine was performed without intravenous contrast. Multiplanar CT image reconstructions were also generated.  COMPARISON:  None.  FINDINGS: The alignment is anatomic. The vertebral body heights are maintained. There is loss of the normal cervical lordosis with mild kyphosis. There is no acute fracture. There is no static listhesis. The prevertebral soft tissues are normal. The intraspinal soft tissues are not fully imaged on this examination due to poor soft tissue contrast, but there is no gross soft tissue abnormality.  There is degenerative disc disease at C5-6. There is a broad-based disc osteophyte complex at C5-6 impressing on the ventral thecal sac. There is bilateral uncovertebral degenerative change at C5-6  The visualized portions  of the lung apices demonstrate no focal abnormality.  IMPRESSION: 1. No acute osseous injury of the cervical spine. 2. Degenerative disc disease with a broad-based disc osteophyte complex at C5-6.   Electronically Signed   By: Kathreen Devoid   On: 05/23/2015 19:03     EKG Interpretation None       5:56 PM Discussed case in great detail with attending physician, Dr. Aline Brochure. Recommended CT cervical spine without contrast to be performed. Recommended prednisone for radiculopathy.  MDM   Final diagnoses:  Cervical radiculopathy    Medications - No data to display  Filed Vitals:   05/23/15 1437  BP: 130/89  Pulse: 93  Temp: 98.7 F (37.1 C)  TempSrc: Oral  Resp: 20  Height: 5\' 5"  (1.651 m)  Weight: 165 lb 9.6 oz (75.116 kg)  SpO2: 96%   I personally performed the services described in this documentation, which was scribed in my presence. The recorded information has been reviewed and is accurate.  CT cervical spine  without contrast noted no acute osseous injury of the cervical spine. Degenerative disc disease with a broad based disc osteophyte complexes at C5-C6. Patient presenting to the ED with left arm pain has been ongoing for approximately 3 months, starting in March 2016 with radiation down into her fingers. Describes an intermittent tingling sensation that comes and goes to her left arm. CT cervical spine without contrast identified degenerative disc disease of the cervical spine, high suspicion of radiculopathy. Negative focal neurological deficits. Equal grip strengths. Pulses palpable and strong. Negative findings of vascular compromise. Full range of motion to upper and lower extremities bilaterally without difficulty or ataxia. Good strength. Patient stable, afebrile. Patient not septic appearing. Negative signs of respiratory distress. Discharged patient. Discharge patient with prednisone for radiculopathy. Referred patient to her primary care provider and orthopedics. Discussed with patient to rest and stay hydrated. Discussed with patient to apply warm compressions and massage. Discussed with patient to avoid any strenuous activity or heavy lifting. Discussed with patient to closely monitor symptoms and if symptoms are to worsen or change to report back to the ED - strict return instructions given.  Patient agreed to plan of care, understood, all questions answered.   Jamse Mead, PA-C 05/23/15 1957  Pamella Pert, MD 05/24/15 1218

## 2015-05-23 NOTE — ED Notes (Signed)
Pt has been having numbness/tingling in left arm since March, now just having pain.  Seen PCP 05-21-15, xrays done, was told she has muscle spasms in neck, gave Ibuprofen and muscle relaxers withi no relief.  Pain worse today.

## 2015-05-23 NOTE — Discharge Instructions (Signed)
Please call your doctor for a followup appointment within 24-48 hours. When you talk to your doctor please let them know that you were seen in the emergency department and have them acquire all of your records so that they can discuss the findings with you and formulate a treatment plan to fully care for your new and ongoing problems. Please follow-up with her primary care provider Please follow-up with orthopedics Please rest and stay hydrated Please take medications as prescribed Please massage with icy hot ointment and perform slow circular motions Please avoid any heavy lifting Please continue to monitor symptoms closely and if symptoms are to worsen or change (fever greater than 101, chills, sweating, nausea, vomiting, chest pain, shortness of breathe, difficulty breathing, weakness, numbness, tingling, worsening or changes to pain pattern, neck swelling, swelling to the arm, red streaks, fall, injury, inability swallow, visual changes, decreased grip strength, dropping of objects) please report back to the Emergency Department immediately.   Cervical Radiculopathy Cervical radiculopathy means a nerve in the neck is pinched or bruised. This can cause pain or loss of feeling (numbness) that runs from your neck to your arm and fingers. HOME CARE   Put ice on the injured or painful area.  Put ice in a plastic bag.  Place a towel between your skin and the bag.  Leave the ice on for 15-20 minutes, 03-04 times a day, or as told by your doctor.  If ice does not help, you can try using heat. Take a warm shower or bath, or use a hot water bottle as told by your doctor.  You may try a gentle neck and shoulder massage.  Use a flat pillow when you sleep.  Only take medicines as told by your doctor.  Keep all physical therapy visits as told by your doctor.  If you are given a soft collar, wear it as told by your doctor. GET HELP RIGHT AWAY IF:   Your pain gets worse and is not controlled  with medicine.  You lose feeling or feel weak in your hand, arm, face, or leg.  You have a fever or stiff neck.  You cannot control when you poop or pee (incontinence).  You have trouble with walking, balance, or speaking. MAKE SURE YOU:   Understand these instructions.  Will watch your condition.  Will get help right away if you are not doing well or get worse. Document Released: 11/16/2011 Document Revised: 02/19/2012 Document Reviewed: 11/16/2011 [facility] Patient Information 2015 [place], [place]. This information is not intended to replace advice given to you by your health care provider. Make sure you discuss any questions you have with your health care provider.

## 2015-09-07 DIAGNOSIS — C50919 Malignant neoplasm of unspecified site of unspecified female breast: Secondary | ICD-10-CM

## 2015-09-07 HISTORY — DX: Malignant neoplasm of unspecified site of unspecified female breast: C50.919

## 2015-09-09 ENCOUNTER — Encounter: Payer: Self-pay | Admitting: *Deleted

## 2015-09-09 ENCOUNTER — Telehealth: Payer: Self-pay | Admitting: *Deleted

## 2015-09-09 DIAGNOSIS — C50411 Malignant neoplasm of upper-outer quadrant of right female breast: Secondary | ICD-10-CM | POA: Insufficient documentation

## 2015-09-09 HISTORY — DX: Malignant neoplasm of upper-outer quadrant of right female breast: C50.411

## 2015-09-09 NOTE — Telephone Encounter (Signed)
Confirmed [facility] for 09/15/15 at 0830 .  Instructions and contact information given.

## 2015-09-14 NOTE — Progress Notes (Addendum)
[place]  Telephone:([phone]) [phone] Fax:([phone]) [place] Note   Patient Care Team: Kristie Cowman, MD as PCP - General (Family Medicine) Stark Klein, MD as Consulting Physician (General Surgery) Truitt Merle, MD as Consulting Physician (Hematology) Arloa Koh, MD as Consulting Physician (Radiation Oncology) Mauro Kaufmann, RN as Registered Nurse Rockwell Germany, RN as Registered Nurse 09/15/2015  CHIEF COMPLAINTS/PURPOSE OF CONSULTATION:  Newly diagnosed right breast cancer  HISTORY OF PRESENTING ILLNESS:  Sandra James 49 y.o. female with past medical history of stage I left breast cancer, is here because of newly diagnosed right breast cancer. She presents to our multidisciplinary breast clinic by herself.  The right breast cancer was discovered by screening mammogram. She did not have any palpable mass, or any constitutional symptoms. She was diagnosed with stage I left breast cancer at age of 82, she had lumpectomy, radiation, and adjuvant chemotherapy and 5 years of tamoxifen. She was treated by Dr. Louann Sjogren in [place]. She moved to [facility] about year ago due to job change. She has been very compliant with annual screening mammogram.  She had hysterectomy and bilateral oophorectomy and sphincterectomy 5 years ago for heavy bleeding.. She has been having hot flashes since then, moderate, but manageable. She is single, lives alone, works for 2 to [place] has to go up children who live in [place].  MEDICAL HISTORY:  Past Medical History  Diagnosis Date  . Complication of anesthesia     slow to wake up- BP was low, fainted next day  . MVP (mitral valve prolapse)   . Infection     UTI  . Anxiety   . Fibroid   . Cancer [facility])     left brast lumpectomy   . Breast cancer of upper-outer quadrant of right female breast ([place]) 09/09/2015  . Diabetes mellitus without complication ([place])   . Depression     SURGICAL HISTORY: Past  Surgical History  Procedure Laterality Date  . Abdominal hysterectomy    . Breast surgery  2004    left lumpectomy    SOCIAL HISTORY: Social History   Social History  . Marital Status: Single     Spouse Name: N/A  . Number of Children: 2 children, 19 daughter and 50 yo son    . Years of Education: N/A   Occupational History  . She is a Barista rep   Social History Main Topics  . Smoking status: Never Smoker   . Smokeless tobacco: Never Used  . Alcohol Use: Yes     Comment: 2-3/wk  . Drug Use: No  . Sexual Activity: Yes    Birth Control/ Protection: Surgical   Other Topics Concern  . Not on file   Social History Narrative    FAMILY HISTORY: Family History  Problem Relation Age of Onset  . Hypertension Mother   . Stroke Mother   . Heart disease Father   . Stroke Father     ALLERGIES:  is allergic to percocet.  MEDICATIONS:  Current Outpatient Prescriptions  Medication Sig Dispense Refill  . lisinopril (PRINIVIL,ZESTRIL) 5 MG tablet Take 5 mg by mouth daily.   0  . metFORMIN (GLUCOPHAGE) 500 MG tablet 500 mg 2 (two) times daily with a meal.   0  . omeprazole (PRILOSEC) 20 MG capsule Take 1 capsule (20 mg total) by mouth daily. (Patient taking differently: Take 20 mg by mouth daily. As needed) 14 capsule 0   No current facility-administered medications  for this visit.    REVIEW OF SYSTEMS:   Constitutional: Denies fevers, chills or abnormal night sweats Eyes: Denies blurriness of vision, double vision or watery eyes Ears, nose, mouth, throat, and face: Denies mucositis or sore throat Respiratory: Denies cough, dyspnea or wheezes Cardiovascular: Denies palpitation, chest discomfort or lower extremity swelling Gastrointestinal:  Denies nausea, heartburn or change in bowel habits Skin: Denies abnormal skin rashes Lymphatics: Denies new lymphadenopathy or easy bruising Neurological:Denies numbness, tingling or new weaknesses Behavioral/Psych: Mood is stable,  no new changes  All other systems were reviewed with the patient and are negative.  PHYSICAL EXAMINATION: ECOG PERFORMANCE STATUS: 0 - Asymptomatic  Filed Vitals:   09/15/15 0909  BP: 127/71  Pulse: 79  Temp: 98.6 F (37 C)  Resp: 18   Filed Weights   09/15/15 0909  Weight: 161 lb 6.4 oz (73.211 kg)    GENERAL:alert, no distress and comfortable SKIN: skin color, texture, turgor are normal, no rashes or significant lesions EYES: normal, conjunctiva are pink and non-injected, sclera clear OROPHARYNX:no exudate, no erythema and lips, buccal mucosa, and tongue normal  NECK: supple, thyroid normal size, non-tender, without nodularity LYMPH:  no palpable lymphadenopathy in the cervical, axillary or inguinal LUNGS: clear to auscultation and percussion with normal breathing effort HEART: regular rate & rhythm and no murmurs and no lower extremity edema ABDOMEN:abdomen soft, non-tender and normal bowel sounds Musculoskeletal:no cyanosis of digits and no clubbing  PSYCH: alert & oriented x 3 with fluent speech NEURO: no focal motor/sensory deficits Breasts: Breast inspection showed them to be symmetrical with no nipple discharge. There is a 1.5cm mass at 11-12 o'clock position of the right breast, slightly tender, no skin or nipple change. Palpation of the left breast and axilla revealed no obvious mass that I could appreciate.  LABORATORY DATA:  I have reviewed the data as listed Lab Results  Component Value Date   WBC 7.5 09/15/2015   HGB 13.9 09/15/2015   HCT 42.0 09/15/2015   MCV 90.5 09/15/2015   PLT 368 09/15/2015    Recent Labs  09/29/14 1325 09/15/15 0859  NA 140 141  K 4.2 3.6  CL 100  --   CO2 27 26  GLUCOSE 99 122  BUN 7 7.8  CREATININE 0.56 0.8  CALCIUM 9.7 9.4  GFRNONAA >90  --   GFRAA >90  --   PROT 8.5* 7.6  ALBUMIN 4.4 4.2  AST 18 18  ALT 16 22  ALKPHOS 100 80  BILITOT 0.8 1.19   PATHOLOGY REPORT  Pathology report Diagnosis 09/08/2015 Breast  right needle core biopsy, upper, outer quadrant posterior depth -Invasive ductal carcinoma -Ductal carcinoma in situ  Microscopic comment The carcinoma appears grade 2-3.  Prognostic indicators ER; 100% positive strongly staining intensity Progesterone receptor; 100%, positive, strongly staining intensity Proliferation Marker Ki-67 60%  RADIOGRAPHIC STUDIES: I have personally reviewed the radiological images as listed and agreed with the findings in the report.  Ultrasound and diagnostic Mammogram 09/07/2015 Impression There is a 0.8 cm lobulated mass with an in distinct margin in the right breast at 11 to 12:00 9 cm from the nipple. This is a mild great concern for malignancy and the biopsy is recommended.   ASSESSMENT & PLAN:  49 year old African-American female, surgical postmenopausal, history of stage I left breast cancer, with newly diagnosed right stage I breast cancer.  1. Right breast invasive ductal carcinoma, T1bN 0M0, stage I, grade 2-3, ER+/PR+/HER2+ -I reviewed her imaging findings and biopsy results in  great details. -Giving the small size of her breast tumor, we recommend her to have breast surgery first. She was seen by Dr. Barry Dienes today. Due to her previous breast cancer history, patient has determined to have bilateral mastectomy, and possible reconstruction. -we discussed that HER-2 positive breast cancers are more aggressive, with higher risk of recurrence after surgery. Depends on the final surgical staging of the tumor, and lymph nodes involvement, I would recommend adjuvant chemotherapy if tumor more than 1 cm, or positive lymph nodes. If the tumor is less than 1 cm, I will consider mammaprint test o further stratify her risk of recurrence, and determine about her adjuvant chemotherapy. -she previously has received Adriamycin-based chemotherapy. So if she needs chemotherapy, more likely will be TCHP or weekly Taxol and Herceptin,  Followed by maintenance Herceptin to  complete a 1 year therapy -Given her ER/PR positive disease, and post menopausal status, I recommend her to take aromatase inhibitor for 5-10 years. -She was also seen by radiation oncologist Dr. Valere Dross today. She would need postmastectomy radiation only if she has positive lymph nodes.  2. Genetics -Due to her young age and recurrent breast cancer, she will be referred to see a genetic counselor in our cancer center.  3. DM -she will continue follow-up with her primary care physician  Plan -She is going to have bilateral mastectomy soon -Genetic referral -I'll see her back 2-3 weeks after her surgery. -we have requested her previous oncology records from Dr. Louann Sjogren  All questions were answered. The patient knows to call the clinic with any problems, questions or concerns. I spent 55 minutes counseling the patient face to face. The total time spent in the appointment was 60 minutes and more than 50% was on counseling.     Truitt Merle, MD 09/15/2015 11:04 AM

## 2015-09-15 ENCOUNTER — Ambulatory Visit (HOSPITAL_BASED_OUTPATIENT_CLINIC_OR_DEPARTMENT_OTHER): Payer: BLUE CROSS/BLUE SHIELD | Admitting: Hematology

## 2015-09-15 ENCOUNTER — Ambulatory Visit
Admission: RE | Admit: 2015-09-15 | Discharge: 2015-09-15 | Disposition: A | Discharge status: Home / Self Care | Payer: BLUE CROSS/BLUE SHIELD | Source: Ambulatory Visit | Attending: Radiation Oncology | Admitting: Radiation Oncology

## 2015-09-15 ENCOUNTER — Encounter: Payer: Self-pay | Admitting: Nurse Practitioner

## 2015-09-15 ENCOUNTER — Encounter: Payer: Self-pay | Admitting: Physical Therapy

## 2015-09-15 ENCOUNTER — Encounter: Payer: Self-pay | Admitting: Hematology

## 2015-09-15 ENCOUNTER — Ambulatory Visit: Payer: BLUE CROSS/BLUE SHIELD | Attending: General Surgery | Admitting: Physical Therapy

## 2015-09-15 ENCOUNTER — Encounter: Payer: Self-pay | Admitting: Skilled Nursing Facility1

## 2015-09-15 ENCOUNTER — Other Ambulatory Visit: Payer: Self-pay | Admitting: General Surgery

## 2015-09-15 ENCOUNTER — Encounter: Payer: Self-pay | Admitting: *Deleted

## 2015-09-15 ENCOUNTER — Other Ambulatory Visit (HOSPITAL_BASED_OUTPATIENT_CLINIC_OR_DEPARTMENT_OTHER): Payer: BLUE CROSS/BLUE SHIELD

## 2015-09-15 VITALS — BP 127/71 | HR 79 | Temp 98.6°F | Resp 18 | Ht 65.0 in | Wt 161.4 lb

## 2015-09-15 DIAGNOSIS — R293 Abnormal posture: Secondary | ICD-10-CM | POA: Insufficient documentation

## 2015-09-15 DIAGNOSIS — E119 Type 2 diabetes mellitus without complications: Secondary | ICD-10-CM | POA: Diagnosis not present

## 2015-09-15 DIAGNOSIS — C50411 Malignant neoplasm of upper-outer quadrant of right female breast: Secondary | ICD-10-CM | POA: Diagnosis not present

## 2015-09-15 DIAGNOSIS — Z170 Estrogen receptor positive status [ER+]: Secondary | ICD-10-CM

## 2015-09-15 LAB — COMPREHENSIVE METABOLIC PANEL (CC13)
ALT: 22 U/L (ref 0–55)
ANION GAP: 9 meq/L (ref 3–11)
AST: 18 U/L (ref 5–34)
Albumin: 4.2 g/dL (ref 3.5–5.0)
Alkaline Phosphatase: 80 U/L (ref 40–150)
BUN: 7.8 mg/dL (ref 7.0–26.0)
CHLORIDE: 106 meq/L (ref 98–109)
CO2: 26 meq/L (ref 22–29)
Calcium: 9.4 mg/dL (ref 8.4–10.4)
Creatinine: 0.8 mg/dL (ref 0.6–1.1)
EGFR: >90 mL/min/{1.73_m2} (ref >90)
Glucose: 122 mg/dl (ref 70–140)
Potassium: 3.6 mEq/L (ref 3.5–5.1)
Sodium: 141 mEq/L (ref 136–145)
Total Bilirubin: 1.19 mg/dL (ref 0.20–1.20)
Total Protein: 7.6 g/dL (ref 6.4–8.3)

## 2015-09-15 LAB — CBC WITH DIFFERENTIAL/PLATELET
BASO%: 0.6 % (ref 0.0–2.0)
Basophils Absolute: 0 10*3/uL (ref 0.0–0.1)
EOS%: 2.5 % (ref 0.0–7.0)
Eosinophils Absolute: 0.2 10*3/uL (ref 0.0–0.5)
HCT: 42 % (ref 34.8–46.6)
HGB: 13.9 g/dL (ref 11.6–15.9)
LYMPH#: 2.1 10*3/uL (ref 0.9–3.3)
LYMPH%: 28.4 % (ref 14.0–49.7)
MCH: 30 pg (ref 25.1–34.0)
MCHC: 33.2 g/dL (ref 31.5–36.0)
MCV: 90.5 fL (ref 79.5–101.0)
MONO#: 0.5 10*3/uL (ref 0.1–0.9)
MONO%: 6.9 % (ref 0.0–14.0)
NEUT#: 4.6 10*3/uL (ref 1.5–6.5)
NEUT%: 61.6 % (ref 38.4–76.8)
Platelets: 368 10*3/uL (ref 145–400)
RBC: 4.64 10*6/uL (ref 3.70–5.45)
RDW: 13 % (ref 11.2–14.5)
WBC: 7.5 10*3/uL (ref 3.9–10.3)

## 2015-09-15 NOTE — Therapy (Signed)
[place], [place], [address] Phone: [phone]   Fax:  [phone]  Physical Therapy Evaluation  Patient Details  Name: Sandra James MRN: [id number] Date of Birth: [birth year] Referring Provider:  Stark Klein, MD  Encounter Date: 09/15/2015      PT End of Session - 09/15/15 1043    Visit Number 1   Number of Visits 1   PT Start Time 4235   PT Stop Time 3614  Also saw pt from 1000-1007   PT Time Calculation (min) 19 min   Activity Tolerance Patient tolerated treatment well   Behavior During Therapy [facility] for tasks assessed/performed      Past Medical History  Diagnosis Date  . Complication of anesthesia     slow to wake up- BP was low, fainted next day  . MVP (mitral valve prolapse)   . Infection     UTI  . Anxiety   . Fibroid   . Cancer [facility])     left brast lumpectomy   . Breast cancer of upper-outer quadrant of right female breast ([place]) 09/09/2015  . Diabetes mellitus without complication ([place])   . Depression     Past Surgical History  Procedure Laterality Date  . Abdominal hysterectomy    . Breast surgery  2004    left lumpectomy  . Cholecystectomy      There were no vitals filed for this visit.  Visit Diagnosis:  Carcinoma of upper-outer quadrant of right female breast [facility]) - Plan: PT plan of care cert/re-cert  Abnormal posture - Plan: PT plan of care cert/re-cert      Subjective Assessment - 09/15/15 1036    Subjective Patient was seen today for a baseline assessment of her newly diagnosed right breast cancer.   Pertinent History Patient was diagnosed on 08/12/15 with right upper outer Triple positive breast cancer measuring 0.8 cm.  Ki67 is 60%.   Patient Stated Goals Reduce lymphedema and learn post op shoulder ROM HEP   Currently in Pain? Yes   Pain Score 9    Pain Location Back   Pain Orientation Lower   Pain Descriptors / Indicators Aching   Pain Type Chronic  pain   Pain Onset More than a month ago   Pain Frequency Intermittent   Aggravating Factors  prolonged sitting   Pain Relieving Factors walking   Multiple Pain Sites No            [facility] PT Assessment - 09/15/15 0001    Assessment   Medical Diagnosis Right breast cancer   Onset Date/Surgical Date 08/12/15   Hand Dominance Right   Prior Therapy none   Precautions   Precautions Other (comment)  Active breast cancer   Restrictions   Weight Bearing Restrictions No   Balance Screen   Has the patient fallen in the past 6 months No   Has the patient had a decrease in activity level because of a fear of falling?  No   Is the patient reluctant to leave their home because of a fear of falling?  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 [employer] She does 30 min of cardio 3 days per week   Cognition  Overall Cognitive Status Within Functional Limits for tasks assessed   Posture/Postural Control   Posture/Postural Control Postural limitations   Postural Limitations Forward head;Rounded Shoulders   ROM / Strength   AROM / PROM / Strength AROM;Strength   AROM   AROM Assessment Site Shoulder   Right/Left Shoulder Right;Left   Right Shoulder Extension 53 Degrees   Right Shoulder Flexion 152 Degrees   Right Shoulder ABduction 168 Degrees   Right Shoulder Internal Rotation 75 Degrees   Right Shoulder External Rotation 77 Degrees   Left Shoulder Extension 55 Degrees   Left Shoulder Flexion 142 Degrees   Left Shoulder ABduction 158 Degrees   Left Shoulder Internal Rotation 80 Degrees   Left Shoulder External Rotation 85 Degrees   Strength   Overall Strength Within functional limits for tasks performed            LYMPHEDEMA/ONCOLOGY QUESTIONNAIRE - 09/15/15 1041    Type   Cancer Type Right breast cancer   Lymphedema Assessments   Lymphedema Assessments Upper extremities   Right Upper Extremity Lymphedema   10 cm Proximal to Olecranon Process 28.8 cm   Olecranon Process 25.3 cm   10 cm Proximal to Ulnar Styloid Process 20.2 cm   Just Proximal to Ulnar Styloid Process 15.9 cm   Across Hand at [employer] 19.2 cm   At [place] of 2nd Digit 6.9 cm   Left Upper Extremity Lymphedema   10 cm Proximal to Olecranon Process 27.8 cm   Olecranon Process 24.1 cm   10 cm Proximal to Ulnar Styloid Process 18.5 cm   Just Proximal to Ulnar Styloid Process 15.2 cm   Across Hand at [employer] 17.9 cm   At [place] of 2nd Digit 6.6 cm      Patient was instructed today in a home exercise program today for post op shoulder range of motion. These included active assist shoulder flexion in sitting, scapular retraction, wall walking with shoulder abduction, and hands behind head external rotation.  She was encouraged to do these twice a day, holding 3 seconds and repeating 5 times when permitted by her physician.         PT Education - 09/15/15 1042    Education provided Yes   Education Details Lymphedema risk reduction and post op shoulder ROM HEP   Person(s) Educated Patient   Methods Explanation;Demonstration;Handout   Comprehension Verbalized understanding;Returned demonstration              Breast Clinic Goals - 09/15/15 1046    Patient will be able to verbalize understanding of pertinent lymphedema risk reduction practices relevant to her diagnosis specifically related to skin care.   Time 1   Period Days   Status Achieved   Patient will be able to return demonstrate and/or verbalize understanding of the post-op home exercise program related to regaining shoulder range of motion.   Time 1   Period Days   Status Achieved   Patient will be able to verbalize understanding of the importance  of attending the postoperative After Breast Cancer Class for further lymphedema risk reduction education and therapeutic exercise.   Time 1   Period Days   Status Achieved              Plan - 09/15/15 1043    Clinical Impression Statement Patient was diagnosed on 08/12/15 with right upper outer Triple positive breast cancer measuring 0.8 cm.  Ki67 is 60%.  She has a history of left breast cancer  in 2005 and underwent a left lumpectomy and axillary node dissection, chemotherapy and radiation.  Due to her previous cancer, she is planning to undergo a bilateral mastectomy with a right sentinel node biopsy and immediate reconstruction.  She will undergo chemotherapy (Herceptin) and may need radiation.  She will benefit from post op physical therapy to regain shoulder ROM and strength and reduce lymphedema risk.   Pt will benefit from skilled therapeutic intervention in order to improve on the following deficits Pain;Decreased strength;Decreased knowledge of precautions;Impaired UE functional use;Decreased range of motion   Rehab Potential Excellent   Clinical Impairments Affecting Rehab Potential none   PT Frequency One time visit   PT Treatment/Interventions Therapeutic exercise;Patient/family education   Consulted and Agree with Plan of Care Patient       Patient will follow up at outpatient cancer rehab if needed following surgery.  If the patient requires physical therapy at that time, a specific plan will be dictated and sent to the referring physician for approval. The patient was educated today on appropriate basic range of motion exercises to begin post operatively and the importance of attending the After Breast Cancer class following surgery.  Patient was educated today on lymphedema risk reduction practices as it pertains to recommendations that will benefit the patient immediately following surgery.  She verbalized good understanding.  No additional physical therapy is indicated at  this time.      Problem List Patient Active Problem List   Diagnosis Date Noted  . Breast cancer of upper-outer quadrant of right female breast ([place]) 09/09/2015    Annia Friendly, PT 09/15/2015 11:41 AM  [place], [place], [address] Phone: [phone]   Fax:  [phone]

## 2015-09-15 NOTE — Progress Notes (Signed)
Sandra James is a very pleasant 49 y.o. female from [place], [place] with newly diagnosed grade 2-3 invasive ductal carcinoma of the right breast.  Biopsy results revealed the tumor's prognostic profile is ER positive, PR positive, and HER2/neu positive.   She presents today to the [facility]) for treatment consideration and recommendations from the breast surgeon, radiation oncologist, and medical oncologist.     I briefly met with Sandra James during her [facility] visit today. We discussed the purpose of the Survivorship Clinic, which will include monitoring for recurrence, coordinating completion of age and gender-appropriate cancer screenings, promotion of overall wellness, as well as managing potential late/long-term side effects of anti-cancer treatments.    The treatment plan for Sandra James will likely include surgery, radiation therapy, and anti-estrogen therapy.  She will meet with the Genetics Counselor due to her age and personal history of breast cancer. As of today, the intent of treatment for Sandra James is cure, therefore she will be eligible for the Survivorship Clinic upon her completion of treatment.  Her survivorship care plan (SCP) document will be drafted and updated throughout the course of her treatment trajectory. She will receive the SCP in an office visit with myself in the Survivorship Clinic once she has completed treatment.   Sandra James was encouraged to ask questions and all questions were answered to her satisfaction.  She was given my business card and encouraged to contact me with any concerns regarding survivorship.  I look forward to participating in her care.   Kenn File, [place] [phone]

## 2015-09-15 NOTE — Progress Notes (Signed)
Clinical Social Work [place] Psychosocial Distress Screening [place]  Patient completed distress screening protocol and scored a 2 on the Psychosocial Distress Thermometer which indicates mild distress. Clinical Social Worker met with patient in [facility] to assess for distress and other psychosocial needs. Patient stated she was doing "ok" and felt comfortable with her treatment plan and treatment team. CSW and patient discussed common feeling and emotions when being diagnosed with cancer, and the importance of support during treatment. CSW informed patient of the support team and support services at [facility], and patient was agreeable to an Network engineer. CSW provided contact information and encouraged patient to call with any questions or concerns.   ONCBCN DISTRESS SCREENING 09/15/2015  Screening Type Initial Screening  Distress experienced in past week (1-10) 2  Emotional problem type Nervousness/Anxiety  Information Concerns Type Lack of info about diagnosis;Lack of info about treatment;Lack of info about complementary therapy choices  Physician notified of physical symptoms Yes  Referral to clinical psychology No  Referral to clinical social work Yes  Referral to dietition No  Referral to financial advocate No  Referral to support programs Yes  Referral to palliative care No   Johnnye Lana, MSW, LCSW, OSW-C Clinical Social Worker [place] [phone]

## 2015-09-15 NOTE — Progress Notes (Signed)
[facility] NEW PATIENT EVALUATION  Name: Sandra James MRN: [id number]  Date:   09/15/2015           DOB: 05-07-1966  Status: outpatient   CC: Sandra Frames, MD  Dr. Stark James   REFERRING PHYSICIAN: Dr. 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 female who is seen today through the courtesy of Dr. Barry James at the breast multidisciplinary clinic for evaluation of her T1b invasive ductal/DCIS of the right breast.  At the time of a screening mammogram she was found to have suspicious calcifications within the right breast.  Ultrasound showed a 0.8 cm mass at approximately 11 to 12:00.  This was biopsied on 09/07/2015 and felt to be diagnostic for invasive ductal carcinoma/DCIS.  Her disease was ER and PR positive at 100% with an elevated Ki-67 of 60%.  The tumor was also HER-2/neu positive.  She tells me that she had BRCA testing back 2011.  She was negative at that time. She had an oophorectomy at that time. She is seen today with Dr. Barry James and Dr. Burr James.    PREVIOUS RADIATION THERAPY: History of left breast radiation therapy following conservative surgery in 2004.  This was done under the direction of Dr. Horris James at [facility] in [place]    PAST MEDICAL HISTORY:  has a past medical history of Complication of anesthesia; MVP (mitral valve prolapse); Infection; Anxiety; Fibroid; Cancer ([place]); Breast cancer of upper-outer quadrant of right female breast ([place]) (09/09/2015); Diabetes mellitus without complication ([place]); and Depression.     PAST SURGICAL HISTORY:  Past Surgical History  Procedure Laterality Date  . Abdominal hysterectomy    . Breast surgery  2004    left lumpectomy  . Cholecystectomy       FAMILY HISTORY: family history includes Cancer (age of onset: 47) in her sister; Heart disease in her father;  Hypertension in her mother; Stroke in her father and mother.    Her sister was diagnosed with breast cancer 37.  Her mother died from complications of Alzheimer's disease at 41.  Her father died of a heart attack in his 79s.    SOCIAL HISTORY:  reports that she has never smoked. She has never used smokeless tobacco. She reports that she drinks alcohol. She reports that she does not use illicit drugs.  Single, 2 children ages 47 and 36.  She works in Therapist, art.    ALLERGIES: Percocet   MEDICATIONS:  Current Outpatient Prescriptions  Medication Sig Dispense Refill  . lisinopril (PRINIVIL,ZESTRIL) 5 MG tablet Take 5 mg by mouth daily.   0  . metFORMIN (GLUCOPHAGE) 500 MG tablet 500 mg 2 (two) times daily with a meal.   0  . omeprazole (PRILOSEC) 20 MG capsule Take 1 capsule (20 mg total) by mouth daily. (Patient taking differently: Take 20 mg by mouth daily. As needed) 14 capsule 0   No current facility-administered medications for this encounter.     REVIEW OF SYSTEMS:  Pertinent items are noted in HPI.    PHYSICAL EXAM:  alert and oriented 49 year old African American female appearing her stated age.   Wt Readings from Last 3 Encounters:  09/15/15 161 lb 6.4 oz (73.211 kg)  05/23/15 165 lb 9.6 oz (75.116 kg)  09/29/14 156 lb (70.761 kg)   Temp Readings from Last 3 Encounters:  09/15/15 98.6 F (37 C) Oral  05/23/15 98.7 F (37.1 C) Oral  09/29/14 98.5 F (36.9 C) Oral   BP Readings from Last 3 Encounters:  09/15/15 127/71  05/23/15 128/74  09/29/14 122/85   Pulse Readings from Last 3 Encounters:  09/15/15 79  05/23/15 87  09/29/14 78   Head and neck examination: Grossly unremarkable.  Nodes: There is no palpable cervical, supraclavicular, or axillary lymphadenopathy.  Breasts: On inspection of the right breast there is a biopsy wound at approximately 11:30 within the upper-outer quadrant of her right breast.  No discreet masses are palpable.  On inspection left  breast there is slight architectural distortion along the lateral left breast with a partial mastectomy scar at approximately 3:00.  No masses are appreciated.  There is slight thickening of the left breast.  Extremities: Without edema.      LABORATORY DATA:  Lab Results  Component Value Date   WBC 7.5 09/15/2015   HGB 13.9 09/15/2015   HCT 42.0 09/15/2015   MCV 90.5 09/15/2015   PLT 368 09/15/2015   Lab Results  Component Value Date   NA 141 09/15/2015   K 3.6 09/15/2015   CL 100 09/29/2014   CO2 26 09/15/2015   Lab Results  Component Value Date   ALT 22 09/15/2015   AST 18 09/15/2015   ALKPHOS 80 09/15/2015   BILITOT 1.19 09/15/2015      IMPRESSIO clinical stage I A (T1b N0 M0) invasive ductal/DCIS of the right breast.  I explained to the patient that she may be a candidate for breast preservation following repeat genetic testing.  We briefly reviewed the potential acute and late toxicities of radiation therapy.  She saw Dr. Barry James earlier, and the patient plans on bilateral mastectomies with reconstruction.  We briefly discussed delayed versus immediate reconstruction.  In all likelihood, she will not require post mastectomy radiation therapy, unless she is found to have involved lymph nodes.    PLAN: As discussed above.  I spent  30 minutes minutes face to face with the patient and more than 50% of that time was spent in counseling and/or coordination of care.

## 2015-09-15 NOTE — Progress Notes (Signed)
Subjective:     Patient ID: Sandra James, female   DOB: 12/17/1965, 49 y.o.   MRN: [id number]  HPI   Review of Systems     Objective:   Physical Exam For the patient to understand and be given the tools to implement a healthy plant based diet during their cancer diagnosis.     Assessment:     Patient was seen today and found to be in good spirits and alone. Pt states she works 2 jobs and was recently dx with type 2 diabetes. Pts ht 5'5'', 161 pounds, and BMI 26.9. Pt states her physician wants her to lose weight. Pts medications: metformin and lisinopril. Pt states she has irregular bowel movements one day she is constipated and the next they are lose.     Plan:     Dietitian educated the patient on implementing a plant based diet by incorporating more plant proteins, fruits, and vegetables. As a part of a healthy routine physical activity was discussed. Dietitian educated the pt on bowel movements and the dietary options.  Dietitian advised she see a dietitian for her diabetes. A folder of evidence based information with a focus on a plant based diet and general nutrition during cancer was given to the patient.  The importance of legitimate, evidence based information was discussed and examples were given. As a part of the continuum of care the cancer dietitian's contact information was given to the patient in the event they would like to have a follow up appointment.

## 2015-09-15 NOTE — Patient Instructions (Signed)

## 2015-09-20 ENCOUNTER — Telehealth: Payer: Self-pay | Admitting: *Deleted

## 2015-09-20 NOTE — Telephone Encounter (Signed)
Left message for a return phone call to follow up from [facility].  Awaiting patient response. 

## 2015-09-28 ENCOUNTER — Encounter: Payer: Self-pay | Admitting: Genetic Counselor

## 2015-09-28 ENCOUNTER — Other Ambulatory Visit: Payer: BLUE CROSS/BLUE SHIELD

## 2015-09-28 ENCOUNTER — Ambulatory Visit (HOSPITAL_BASED_OUTPATIENT_CLINIC_OR_DEPARTMENT_OTHER): Payer: BLUE CROSS/BLUE SHIELD | Admitting: Genetic Counselor

## 2015-09-28 ENCOUNTER — Encounter: Payer: Self-pay | Admitting: *Deleted

## 2015-09-28 DIAGNOSIS — Z315 Encounter for genetic counseling: Secondary | ICD-10-CM

## 2015-09-28 DIAGNOSIS — C50411 Malignant neoplasm of upper-outer quadrant of right female breast: Secondary | ICD-10-CM

## 2015-09-28 DIAGNOSIS — Z800 Family history of malignant neoplasm of digestive organs: Secondary | ICD-10-CM | POA: Diagnosis not present

## 2015-09-28 DIAGNOSIS — Z853 Personal history of malignant neoplasm of breast: Secondary | ICD-10-CM | POA: Insufficient documentation

## 2015-09-28 DIAGNOSIS — Z803 Family history of malignant neoplasm of breast: Secondary | ICD-10-CM

## 2015-09-28 DIAGNOSIS — Z809 Family history of malignant neoplasm, unspecified: Secondary | ICD-10-CM

## 2015-09-28 NOTE — Progress Notes (Signed)
REFERRING PROVIDER: Truitt Merle, MD  PRIMARY PROVIDER:  Andria Frames, MD  PRIMARY REASON FOR VISIT:  1. Breast cancer of upper-outer quadrant of right female breast ([place])   2. History of left breast cancer   3. Family history of breast cancer in sister   24. Family history of colon cancer   5. Family history of cancer      HISTORY OF PRESENT ILLNESS:   [name], a 49 y.o. female, was seen for a [place] cancer genetics consultation at the request of Dr. Burr Medico due to a personal history of metachronous bilateral breast cancers (diagnosed at 28 and 51) and family history of breast cancer.  [name] presents to clinic today to discuss the possibility of a hereditary predisposition to cancer, genetic testing, and to further clarify her future cancer risks, as well as potential cancer risks for family members.   In 2004, at the age of 12, Sandra James was diagnosed with cancer of the left breast. This was treated with left lumpectomy, radiation, chemotherapy, and tamoxifen.  Sandra James was recently diagnosed with invasive ductal carcinoma and DCIS of the right breast in September 2016, at the age of 69.  Hormone receptor status was triple positive, with a Ki67 of 60%.  Sandra James's surgery date is pending, but she would like a bilateral mastectomy and genetic test results will help to reinforce her further treatment and medical management decisions.  She had previous negative BRCA1/2 testing in 2011.   CANCER HISTORY:   No history exists.     HORMONAL RISK FACTORS:  Menarche was at age 49-15.  First live birth at age 80.  OCP use for approximately 1 month  Ovaries intact: no.  Hysterectomy: yes.  Menopausal status: perimenopausal.  HRT use: 0 years. Colonoscopy: no; not examined. Mammogram within the last year: yes. Number of breast biopsies: 2. Up to date with pelvic exams:  n/a. Any excessive radiation exposure in the past:  no  Past Medical History  Diagnosis Date  .  Complication of anesthesia     slow to wake up- BP was low, fainted next day  . MVP (mitral valve prolapse)   . Infection     UTI  . Anxiety   . Fibroid   . Cancer [facility])     left brast lumpectomy   . Breast cancer of upper-outer quadrant of right female breast ([place]) 09/09/2015  . Diabetes mellitus without complication ([place])   . Depression     Past Surgical History  Procedure Laterality Date  . Abdominal hysterectomy    . Breast surgery  2004    left lumpectomy  . Cholecystectomy      Social History   Social History  . Marital Status: Unknown    Spouse Name: N/A  . Number of Children: N/A  . Years of Education: N/A   Social History Main Topics  . Smoking status: Never Smoker   . Smokeless tobacco: Never Used     Comment: previous secondhand smoke exposure  . Alcohol Use: Yes     Comment: 1 bottle of wine per wk  . Drug Use: No  . Sexual Activity: Yes    Birth Control/ Protection: Surgical   Other Topics Concern  . None   Social History Narrative     FAMILY HISTORY:  We obtained a detailed, 4-generation family history.  Significant diagnoses are listed below: Family History  Problem Relation Age of Onset  . Hypertension Mother   . Stroke Mother   .  Alzheimer's disease Mother   . Heart disease Father   . Stroke Father   . Heart attack Father   . Breast cancer Sister 7    double mastectomy  . Other Sister     "stomach tumor that wrapped around reproductive organs"; dx. 53s; required partial hysterectomy  . Alzheimer's disease Maternal Grandmother   . Alzheimer's disease Paternal Grandmother   . Other Other     had to have lymph nodes removed and radiation; dx. 30s  . Cancer Maternal Aunt     unspecified type; dx. <50  . Colon cancer Maternal Uncle     dx. 67s  . Breast cancer Cousin     dx. 28s    Sandra James has one daughter, age 64, and one son, age 94.  She currently has one grandson and one granddaughter.  She has two full sisters, ages 76 and  56.  One sister was diagnosed with breast cancer at 55, although they had been monitoring her for a while, and had a double mastectomy at that time.  This sister has three daughters, none of whom have had cancer.  Sandra James's other sister has a history of a "stomach tumor that wrapped around her reproductive organs" and required a partial hysterectomy in her 48s.  This sister has one son and one daughter--her daughter is currently in her 99s and recently had to undergo lymph node removal and radiation for an unspecified reason.  Sandra James also has one paternal half-sister who is currently in her early 13s, but for whom Ms. Sardinha has limited information.  Sandra James's mother died from Alzheimer's at 91.  She was never diagnosed with cancer.  She had four full brothers and two full sisters.  Only one sister is still alive in her 71s; she has never had cancer.  The other sister died in her 49s; she had an unspecified type of cancer at an age younger than 75.  This aunt also had four sons and one daughter, none of whom have had cancer.  Two uncles were diagnosed with colon cancer in their 32s.  One other uncle died when he fell down the stairs; the other uncle drown.  Sandra James reports that one maternal first cousin was diagnosed with and died of breast cancer in her 47s.  Her maternal grandmother died of Alzheimer's in her 65s.  She has no information for her maternal grandfather.  Sandra James's father died of a heart attack between the ages of 30 and 56.  He had two full brothers and two full sisters.  One sister is currently in her 16s; the others all passed away at later ages in life.  There is no reported paternal family history of cancer.  Ms. Evetts's paternal grandmother died of Alzheimer's later in life.  She has no information for her paternal grandfather.  Patient's maternal and paternal ancestors are of African American descent. There is no reported Ashkenazi Jewish ancestry. There is no  known consanguinity.  GENETIC COUNSELING ASSESSMENT: Sandra James is a 49 y.o. female with a personal and family history of breast cancer which is somewhat suggestive of a hereditary breast cancer syndrome and predisposition to cancer. We, therefore, discussed and recommended the following at today's visit.   DISCUSSION: We reviewed the characteristics, features and inheritance patterns of hereditary cancer syndromes, particularly those caused by mutations within the BRCA1/2 and additional genes which we have more recently identified to be associated with increased breast cancer risks. We also discussed  genetic testing, including the appropriate family members to test, the process of testing, insurance coverage and turn-around-time for results. We discussed the implications of a negative, positive and/or variant of uncertain significant result. We recommended Ms. Randle pursue genetic testing for the 20-gene Breast/Ovarian Cancer Panel through [facility] Hope Pigeon, MD).  The Breast/Ovarian Cancer Panel offered by [facility] includes sequencing and deletion/duplication analysis for the following 19 genes:  ATM, BARD1, BRCA1, BRCA2, BRIP1, CDH1, CHEK2, FANCC, MLH1, MSH2, MSH6, NBN, PALB2, PMS2, PTEN, RAD51C, RAD51D, TP53, and XRCC2.  This panel also includes deletion/duplication analysis (without sequencing) for one gene, EPCAM.  Based on Ms. Garver's personal history of cancer, she meets medical criteria for genetic testing. Despite that she meets criteria, she may still have an out of pocket cost. We discussed that if her out of pocket cost for testing is over $100, the laboratory will call and confirm whether she wants to proceed with testing.  If the out of pocket cost of testing is less than $100 she will be billed by the genetic testing laboratory.   PLAN: After considering the risks, benefits, and limitations, Ms. Hogsett  provided informed consent to pursue genetic testing and the blood  sample was sent to [facility] for analysis of the 20-gene Breast/Ovarian Cancer Panel test. Results are generally available within approximately 2-3 weeks' time, however, since Ms. Bohlin will use this information to make pending treatment decisions, we will ask that [facility] get these results back to [place] STAT.  Thus, they should be available closer to the 2-week timeframe, at which point they will be disclosed by telephone to Ms. Austin, as will any additional recommendations warranted by these results. Ms. Hodgens will receive a summary of her genetic counseling visit and a copy of her results once available. This information will also be available in Epic. We encouraged Ms. Hitchner to remain in contact with cancer genetics annually so that we can continuously update the family history and inform her of any changes in cancer genetics and testing that may be of benefit for her family. Ms. Mcgruder's questions were answered to her satisfaction today. Our contact information was provided should additional questions or concerns arise.  Thank you for the referral and allowing [place] to share in the care of your patient.   Jeanine Luz, MS Genetic Counselor [email or web address] Phone: [phone]  The patient was seen for a total of 40 minutes in face-to-face genetic counseling.  This patient was discussed with Drs. Magrinat, Lindi Adie and/or Burr Medico who agrees with the above.    _______________________________________________________________________ For Office Staff:  Number of people involved in session: 1 Was an Intern/ student involved with case: no

## 2015-09-28 NOTE — Progress Notes (Signed)
Met with pt after appt with Dr. Dillingham. Relate she does not want to wait for genetic results. She is ready for surgery. Discussed process in getting her surgery scheduled. Called Dr. Byerly's surgery scheduler to inform her of pt plan not to wait for genetic results. Pt request to meet with SW. Called to see if one was available for her to meet with. No answer. Gave pt business cards of our SW so she can call and make an appt. Denies further needs at this time. Encourage pt to call with further questions. Received verbal understanding. 

## 2015-10-04 ENCOUNTER — Telehealth: Payer: Self-pay | Admitting: General Surgery

## 2015-10-04 NOTE — Telephone Encounter (Signed)
d 

## 2015-10-06 ENCOUNTER — Telehealth: Payer: Self-pay | Admitting: Genetic Counselor

## 2015-10-06 ENCOUNTER — Ambulatory Visit: Payer: Self-pay | Admitting: Genetic Counselor

## 2015-10-06 DIAGNOSIS — Z1379 Encounter for other screening for genetic and chromosomal anomalies: Secondary | ICD-10-CM

## 2015-10-06 NOTE — Telephone Encounter (Signed)
Discussed with Ms. Wambold that her genetic test results were negative for pathogenic mutations within any of 20 genes that would cause her to be at an increased risk for breast, ovarian, or other related cancers.  Additionally, no variants of uncertain significance were found.  Thus, we still do not have an explanation for Ms. Chenault's breast cancer history or for the family history of breast cancer.  It could be the case, as for most cancers, that her cancers just happened by chance.  We discussed that her sister who was diagnosed with breast cancer at 16 would be eligible for genetic counseling and testing, if interested.  It does make it less likely that her sister would test positive, since Ms. Shrewsbury herself has already tested negative, but may be worth the additional information.  We discussed that women in the family are still considered to be at an increased risk for breast cancer based on the history.  Ms. Gwynne's daughter and nieces could likely be getting their annual mammograms now (based on her early diagnosis at the age of 37--10 years younger than that diagnosis based on NCCN guidelines).  Colonoscopies should begin at 19.  Ms. Fishbaugh's sister who lives in [place] can use the [email or web address] website to look up a genetic counselor in her area, if she is interested in counseling and testing for herself.  We also discussed that Ms. Ault's two sisters who have not yet had cancer may be eligible for additional breast cancer screening, such as breast MRI, based on their Ms. Efaw and her sister's young ages of breast cancer diagnosis.  They should speak to their primary care providers about this potential option.  Ms. Hakes and her sisters are welcome to call me with any additional questions.

## 2015-10-08 ENCOUNTER — Telehealth: Payer: Self-pay | Admitting: Hematology

## 2015-10-08 ENCOUNTER — Other Ambulatory Visit: Payer: Self-pay | Admitting: Hematology

## 2015-10-08 DIAGNOSIS — Z1379 Encounter for other screening for genetic and chromosomal anomalies: Secondary | ICD-10-CM | POA: Insufficient documentation

## 2015-10-08 NOTE — Progress Notes (Signed)
GENETIC TEST RESULT  HPI: Sandra James was previously seen in the [facility] clinic due to a personal history of cancer at 63 with recurrence at 81, family history of breast and colon cancers, and concerns regarding a hereditary predisposition to cancer. Ms. Rask previously had negative BRCA1/2 testing following her first diagnosis in 2004.  Please refer to our prior cancer genetics clinic note from September 28, 2015 for more information regarding Ms. Vert's medical, social and family histories, and our assessment and recommendations, at the time. Ms. Christman's recent genetic test results were disclosed to her, as were recommendations warranted by these results. These results and recommendations are discussed in more detail below.  GENETIC TEST RESULTS: At the time of Ms. Saathoff's visit on 09/28/15, we recommended she pursue genetic testing of the 20-gene Breast/Ovarian Cancer Panel through [facility] Hope Pigeon, MD).  The Breast/Ovarian Cancer Panel offered by [facility] includes sequencing and deletion/duplication analysis for the following 19 genes:  ATM, BARD1, BRCA1, BRCA2, BRIP1, CDH1, CHEK2,  FANCC, MLH1, MSH2, MSH6, NBN, PALB2, PMS2, PTEN, RAD51C, RAD51D, TP53, and XRCC2.  This panel also includes deletion/duplication analysis (without sequencing) for one gene, EPCAM.  Those results are now back, the report date for which is September 28, 2015.  Genetic testing was normal, and did not reveal a deleterious mutation in these genes.  Additionally, no variants of uncertain significance (VUSes) were found.  The test report will be scanned into EPIC and will be located under the Results Review tab in the Pathology>Molecular Pathology section.   We discussed with Ms. Mazurowski that since the current genetic testing is not perfect, it is possible there may be a gene mutation in one of these genes that current testing cannot detect, but that chance is small. We also discussed, that  it is possible that another gene that has not yet been discovered, or that we have not yet tested, is responsible for the cancer diagnoses in the family, and it is, therefore, important to remain in touch with cancer genetics in the future so that we can continue to offer Ms. Martinek the most up to date genetic testing.   CANCER SCREENING RECOMMENDATIONS: While we still do not have an explanation for Ms. Lawless's breast cancer, this result is reassuring and indicates that Ms. Georgi likely does not have an increased risk for a future cancer due to a mutation in one of these genes. This normal test also suggests that Ms. Machnik's cancer was most likely not due to an inherited predisposition associated with one of these genes.  Most cancers happen by chance and this negative test suggests that her cancer falls into this category.  It is also reassuring that neither of Ms. Stuard's parents had cancer and they lived to later ages in life.  We, therefore, recommended she continue to follow the cancer management and screening guidelines provided by her oncology and primary healthcare providers.    RECOMMENDATIONS FOR FAMILY MEMBERS: Women in this family might be at some increased risk of developing cancer, over the general population risk, simply due to the family history of cancer. We recommended women in this family have a yearly mammogram beginning at age 22, or 65 years younger than the earliest onset of cancer, an an annual clinical breast exam, and perform monthly breast self-exams. Thus, all three of Ms. Branam's sisters should continue to have annual mammogram screening.  Her daughter and nieces could also be having annual mammograms based on Ms. Brindle's age of diagnosis.  Women in this family should also have a gynecological exam as recommended by their primary provider. All family members should have a colonoscopy by age 82.  Furthermore, we discussed that Ms. Glasper's two sisters who have not had  breast cancer, may be eligible for additional breast cancer screening, such as breast MRI, based on Ms. Easterwood and her sister's young ages of breast cancer diagnosis.  They should speak with their primary care providers about this potential option.  Based on Ms. Castrejon's family history, we recommended her sister, who was diagnosed with breast cancer at age 45, have genetic counseling and testing. Ms. Daniel will let us know if we can be of any assistance in coordinating genetic counseling and/or testing for this family member.   FOLLOW-UP: Lastly, we discussed with Ms. Lamson that cancer genetics is a rapidly advancing field and it is possible that new genetic tests will be appropriate for her and/or her family members in the future. We encouraged her to remain in contact with cancer genetics on an annual basis so we can update her personal and family histories and let her know of advances in cancer genetics that may benefit this family.   Our contact number was provided. Ms. Mcroberts's questions were answered to her satisfaction, and she knows she is welcome to call us at anytime with additional questions or concerns.   Jeanine Luz, MS Genetic Counselor [email or web address] Phone: [phone]

## 2015-10-08 NOTE — Telephone Encounter (Signed)
Per pof to sch pt appt-cld & spoke to pty and ggave pt appt time & date

## 2015-10-12 ENCOUNTER — Telehealth: Payer: Self-pay | Admitting: *Deleted

## 2015-10-12 ENCOUNTER — Ambulatory Visit (HOSPITAL_BASED_OUTPATIENT_CLINIC_OR_DEPARTMENT_OTHER): Payer: BLUE CROSS/BLUE SHIELD | Admitting: Hematology

## 2015-10-12 ENCOUNTER — Telehealth: Payer: Self-pay | Admitting: Hematology

## 2015-10-12 ENCOUNTER — Encounter: Payer: Self-pay | Admitting: Hematology

## 2015-10-12 VITALS — BP 119/74 | HR 86 | Temp 98.2°F | Resp 19 | Ht 65.0 in | Wt 161.7 lb

## 2015-10-12 DIAGNOSIS — E119 Type 2 diabetes mellitus without complications: Secondary | ICD-10-CM

## 2015-10-12 DIAGNOSIS — C50411 Malignant neoplasm of upper-outer quadrant of right female breast: Secondary | ICD-10-CM | POA: Diagnosis not present

## 2015-10-12 MED ORDER — LETROZOLE 2.5 MG PO TABS
2.5000 mg | ORAL_TABLET | Freq: Every day | ORAL | Status: DC
Start: 2015-10-12 — End: 2015-12-15

## 2015-10-12 NOTE — Progress Notes (Addendum)
[place]  [phone] [phone]  Clinic follow Up Note   Patient Care Team: Kristie Cowman, MD as PCP - General (Family Medicine) Stark Klein, MD as Consulting Physician (General Surgery) Truitt Merle, MD as Consulting Physician (Hematology) Arloa Koh, MD as Consulting Physician (Radiation Oncology) Mauro Kaufmann, RN as Registered Nurse Rockwell Germany, RN as Registered Nurse Sylvan Cheese, NP as Nurse Practitioner (Nurse Practitioner) 10/12/2015  CHIEF COMPLAINTS:  Follow up right breast cancer  Oncology History   Breast cancer of upper-outer quadrant of right female breast [facility])   Staging form: Breast, AJCC 7th Edition     Clinical stage from 09/15/2015: Stage IA (T1b, N0, M0) - Unsigned       Breast cancer of upper-outer quadrant of right female breast ([place])   09/07/2015 Mammogram Diagnostic mammogram and the ultrasound of the right breast showed a 2.8 cm lobulated mass in the right breast 11 to 12:00 position 9 cm from the nipple. No other lesions or adenopathy.   09/08/2015 Initial Diagnosis Breast cancer of upper-outer quadrant of right female breast ([place])   09/08/2015 Initial Biopsy Right breast mass at the upper outer quadrant core needle biopsy showed invasive ductal carcinoma, grade 2-3, ductal carcinoma in situ.   09/08/2015 Receptors her2 ER 100% positive, PR 100% positive, Ki-67 60%, HER-2 positive with copy # 4.25 and ratio 2.58    HISTORY OF PRESENTING ILLNESS:  Sandra James 49 y.o. female with past medical history of stage I left breast cancer, is here because of newly diagnosed right breast cancer. She presents to our multidisciplinary breast clinic by herself.  The right breast cancer was discovered by screening mammogram. She did not have any palpable mass, or any constitutional symptoms. She was diagnosed with stage I left breast cancer at age of 47, she had lumpectomy, radiation, and adjuvant chemotherapy and 5  years of tamoxifen. She was treated by Dr. Louann Sjogren in [place]. She moved to [facility] about year ago due to job change. She has been very compliant with annual screening mammogram.  She had hysterectomy and bilateral oophorectomy and sphincterectomy 5 years ago for heavy bleeding.. She has been having hot flashes since then, moderate, but manageable. She is single, lives alone, works for 2 to [employer] has to go up children who live in [place].  INTERIM HISTORY Rushie returns for follow-up. She has decided to proceed with bilateral mastectomy and reconstruction. She was seen by plastic surgery also. Her breast surgery has been scheduled for November 25, 2015. Her breast surgeon Dr. Barry Dienes sent her back to see me to consider neoadjuvant therapy while she is waiting for breast surgery. She is doing well, has no complaints.  MEDICAL HISTORY:  Past Medical History  Diagnosis Date  . Complication of anesthesia     slow to wake up- BP was low, fainted next day  . MVP (mitral valve prolapse)   . Infection     UTI  . Anxiety   . Fibroid   . Cancer [facility])     left brast lumpectomy   . Breast cancer of upper-outer quadrant of right female breast ([place]) 09/09/2015  . Diabetes mellitus without complication ([place])   . Depression     SURGICAL HISTORY: Past Surgical History  Procedure Laterality Date  . Abdominal hysterectomy    . Breast surgery  2004    left lumpectomy  . Cholecystectomy      SOCIAL HISTORY: Social History   Social History  .  Marital Status: Single     Spouse Name: N/A  . Number of Children: 2 children, 108 daughter and 20 yo son    . Years of Education: N/A   Occupational History  . She is a Barista rep   Social History Main Topics  . Smoking status: Never Smoker   . Smokeless tobacco: Never Used  . Alcohol Use: Yes     Comment: 2-3/wk  . Drug Use: No  . Sexual Activity: Yes    Birth Control/ Protection: Surgical   Other Topics Concern  . Not on  file   Social History Narrative    FAMILY HISTORY: Family History  Problem Relation Age of Onset  . Hypertension Mother   . Stroke Mother   . Alzheimer's disease Mother   . Heart disease Father   . Stroke Father   . Heart attack Father   . Breast cancer Sister 75    double mastectomy  . Other Sister     "stomach tumor that wrapped around reproductive organs"; dx. 50s; required partial hysterectomy  . Alzheimer's disease Maternal Grandmother   . Alzheimer's disease Paternal Grandmother   . Other Other     had to have lymph nodes removed and radiation; dx. 75s  . Cancer Maternal Aunt     unspecified type; dx. <50  . Colon cancer Maternal Uncle     dx. 25s  . Breast cancer Cousin     dx. 45s    ALLERGIES:  is allergic to lisinopril and percocet.  MEDICATIONS:  Current Outpatient Prescriptions  Medication Sig Dispense Refill  . metFORMIN (GLUCOPHAGE) 500 MG tablet 500 mg 2 (two) times daily with a meal.   0  . omeprazole (PRILOSEC) 20 MG capsule Take 1 capsule (20 mg total) by mouth daily. (Patient taking differently: Take 20 mg by mouth as needed. As needed) 14 capsule 0  . letrozole (FEMARA) 2.5 MG tablet Take 1 tablet (2.5 mg total) by mouth daily. 30 tablet 1   No current facility-administered medications for this visit.    REVIEW OF SYSTEMS:   Constitutional: Denies fevers, chills or abnormal night sweats Eyes: Denies blurriness of vision, double vision or watery eyes Ears, nose, mouth, throat, and face: Denies mucositis or sore throat Respiratory: Denies cough, dyspnea or wheezes Cardiovascular: Denies palpitation, chest discomfort or lower extremity swelling Gastrointestinal:  Denies nausea, heartburn or change in bowel habits Skin: Denies abnormal skin rashes Lymphatics: Denies new lymphadenopathy or easy bruising Neurological:Denies numbness, tingling or new weaknesses Behavioral/Psych: Mood is stable, no new changes  All other systems were reviewed with the  patient and are negative.  PHYSICAL EXAMINATION: ECOG PERFORMANCE STATUS: 0 - Asymptomatic  Filed Vitals:   10/12/15 1555  BP: 119/74  Pulse: 86  Temp: 98.2 F (36.8 C)  Resp: 19   Filed Weights   10/12/15 1555  Weight: 161 lb 11.2 oz (73.347 kg)    GENERAL:alert, no distress and comfortable SKIN: skin color, texture, turgor are normal, no rashes or significant lesions EYES: normal, conjunctiva are pink and non-injected, sclera clear OROPHARYNX:no exudate, no erythema and lips, buccal mucosa, and tongue normal  NECK: supple, thyroid normal size, non-tender, without nodularity LYMPH:  no palpable lymphadenopathy in the cervical, axillary or inguinal LUNGS: clear to auscultation and percussion with normal breathing effort HEART: regular rate & rhythm and no murmurs and no lower extremity edema ABDOMEN:abdomen soft, non-tender and normal bowel sounds Musculoskeletal:no cyanosis of digits and no clubbing  PSYCH: alert & oriented x  3 with fluent speech NEURO: no focal motor/sensory deficits Breasts: Breast inspection showed them to be symmetrical with no nipple discharge. There is a 1.5cm mass at 11-12 o'clock position of the right breast, slightly tender, no skin or nipple change. Palpation of the left breast and axilla revealed no obvious mass that I could appreciate.   LABORATORY DATA:  I have reviewed the data as listed Lab Results  Component Value Date   WBC 7.5 09/15/2015   HGB 13.9 09/15/2015   HCT 42.0 09/15/2015   MCV 90.5 09/15/2015   PLT 368 09/15/2015    Recent Labs  09/15/15 0859  NA 141  K 3.6  CO2 26  GLUCOSE 122  BUN 7.8  CREATININE 0.8  CALCIUM 9.4  PROT 7.6  ALBUMIN 4.2  AST 18  ALT 22  ALKPHOS 80  BILITOT 1.19   Pathology report Diagnosis 09/08/2015 Breast right needle core biopsy, upper, outer quadrant posterior depth -Invasive ductal carcinoma -Ductal carcinoma in situ  Microscopic comment The carcinoma appears grade  2-3.  Prognostic indicators ER; 100% positive strongly staining intensity Progesterone receptor; 100%, positive, strongly staining intensity Proliferation Marker Ki-67 60%  HER-2 positive with copy #4.25 and ratio 2.58  RADIOGRAPHIC STUDIES: I have personally reviewed the radiological images as listed and agreed with the findings in the report.  Ultrasound and diagnostic Mammogram 09/07/2015 Impression There is a 0.8 cm lobulated mass with an in distinct margin in the right breast at 11 to 12:00 9 cm from the nipple. This is a mild great concern for malignancy and the biopsy is recommended.  ASSESSMENT & PLAN:  49 year old African-American female, surgical postmenopausal, history of stage I left breast cancer, with newly diagnosed right stage I breast cancer.  1. Right breast invasive ductal carcinoma, T1bN0M0, stage I, grade 2-3, ER+/PR+/HER2+ -I previously reviewed her imaging findings and biopsy results in great details. -Giving the small size of her breast tumor, we recommend her to have breast surgery first. She was seen by Dr. Barry Dienes today. Due to her previous breast cancer history, patient has determined to have bilateral mastectomy, and possible reconstruction. -we discussed that HER-2 positive breast cancers are more aggressive, with higher risk of recurrence after surgery. Depends on the final surgical staging of the tumor, and lymph nodes involvement, I would recommend adjuvant chemotherapy if tumor more than 1 cm, or positive lymph nodes. If the tumor is less than 1 cm, I will consider mammaprint test o further stratify her risk of recurrence, and determine about her adjuvant chemotherapy. -she previously has received Adriamycin-based chemotherapy. So if she needs chemotherapy, more likely will be TCHP or weekly Taxol and Herceptin,  Followed by maintenance Herceptin to complete a 1 year therapy -Given her ER/PR positive disease, and post menopausal status, I recommend her to take  aromatase inhibitor for 5-10 years. -She was also seen by radiation oncologist Dr. Valere Dross today. She would need postmastectomy radiation only if she has positive lymph nodes. -her surgery is scheduled for 12/15, which is 6 weeks from now. She is little anxious. We discussed the role of neoadjuvant hormonal therapy, to decrease her risk of cancer metastasis while she is waiting for surgery. I recommend letrozole 2.5 mg once daily, potential side effects, which includes but not limited to, hot flash, vaginal and skin dryness, muscular and joint discomfort, slight increased risk of progressive disease and cataract, osteoporosis and fracture, were discussed with her in details, written material was given to her, she agrees to proceed. I sent a  prescription to her pharmacy today and she'll start in a few days. -I'll see her back in 1 month for follow-up.  2. Genetics -Due to her young age and recurrent breast cancer, she was referred to see a genetic counselor in our cancer center. -Her genetic test was negative  3. DM -she will continue follow-up with her primary care physician -She is on metformin  4. Bone health -She is postmenopausal by surgery -I encouraged her to take calcium and vitamin D -I'll obtain a baseline bone density scan in the next 2-3 months.  Plan -start letrozole in a few days -I'll see her back in 4 weeks -She is scheduled to have bilateral mastectomy and tissue spender placement in mid Dec   All questions were answered. The patient knows to call the clinic with any problems, questions or concerns. I spent 25 minutes counseling the patient face to face. The total time spent in the appointment was 30 minutes and more than 50% was on counseling.     Truitt Merle, MD 10/12/2015 5:12 PM

## 2015-10-12 NOTE — Telephone Encounter (Signed)
"  I'm calling to see what time my appointment is for today?"  Informed her appointment today is at 4:00 pm.   No further questions.

## 2015-10-12 NOTE — Telephone Encounter (Signed)
per pof to sch pt appt-gave pt copy of avs °

## 2015-10-15 ENCOUNTER — Encounter: Payer: Self-pay | Admitting: *Deleted

## 2015-10-15 ENCOUNTER — Telehealth: Payer: Self-pay | Admitting: *Deleted

## 2015-10-15 NOTE — Telephone Encounter (Signed)
Received call from pt stating that she is feeling pins & needles pricking in her feet, arms & legs.  It moves around from spot to spot.  She took 1st dose of letrazole today @ 9 or 10 am & is at work.  She states she didn't see this as a side effect & wonders if she should be concerned. Informed that this message would be sent to Dr Burr Medico.

## 2015-10-15 NOTE — Telephone Encounter (Signed)
I called her back, she started letrozole this morning, and the neuropathy started 30 mins after that. Neuropathy is a uncommon side effects from letrozole, I told her to watch it for now, and restart letrozole at 1/2 pill daily when it resolves. Will monitor closely.  Truitt Merle  10/15/2015

## 2015-10-18 ENCOUNTER — Telehealth: Payer: Self-pay | Admitting: Hematology

## 2015-10-18 NOTE — Telephone Encounter (Signed)
Per 11/4 pof add f/u w/YF 12/14/15 and keep December appointments also. Not able to reach patient by phone or leave message. Schedule mailed.

## 2015-11-11 ENCOUNTER — Other Ambulatory Visit: Payer: Self-pay | Admitting: *Deleted

## 2015-11-11 DIAGNOSIS — C50411 Malignant neoplasm of upper-outer quadrant of right female breast: Secondary | ICD-10-CM

## 2015-11-11 DIAGNOSIS — Z853 Personal history of malignant neoplasm of breast: Secondary | ICD-10-CM

## 2015-11-12 ENCOUNTER — Telehealth: Payer: Self-pay | Admitting: Hematology

## 2015-11-12 ENCOUNTER — Ambulatory Visit (HOSPITAL_BASED_OUTPATIENT_CLINIC_OR_DEPARTMENT_OTHER): Payer: BLUE CROSS/BLUE SHIELD | Admitting: Hematology

## 2015-11-12 ENCOUNTER — Encounter: Payer: Self-pay | Admitting: Hematology

## 2015-11-12 ENCOUNTER — Other Ambulatory Visit (HOSPITAL_BASED_OUTPATIENT_CLINIC_OR_DEPARTMENT_OTHER): Payer: BLUE CROSS/BLUE SHIELD

## 2015-11-12 VITALS — BP 111/68 | HR 78 | Temp 98.5°F | Resp 18 | Ht 65.0 in | Wt 161.5 lb

## 2015-11-12 DIAGNOSIS — C50411 Malignant neoplasm of upper-outer quadrant of right female breast: Secondary | ICD-10-CM

## 2015-11-12 DIAGNOSIS — Z853 Personal history of malignant neoplasm of breast: Secondary | ICD-10-CM | POA: Diagnosis not present

## 2015-11-12 DIAGNOSIS — E119 Type 2 diabetes mellitus without complications: Secondary | ICD-10-CM

## 2015-11-12 LAB — CBC WITH DIFFERENTIAL/PLATELET
BASO%: 0.5 % (ref 0.0–2.0)
Basophils Absolute: 0 10*3/uL (ref 0.0–0.1)
EOS ABS: 0.1 10*3/uL (ref 0.0–0.5)
EOS%: 2 % (ref 0.0–7.0)
HEMATOCRIT: 41.2 % (ref 34.8–46.6)
HEMOGLOBIN: 13.8 g/dL (ref 11.6–15.9)
LYMPH#: 2.1 10*3/uL (ref 0.9–3.3)
LYMPH%: 29.1 % (ref 14.0–49.7)
MCH: 30.5 pg (ref 25.1–34.0)
MCHC: 33.5 g/dL (ref 31.5–36.0)
MCV: 91.2 fL (ref 79.5–101.0)
MONO#: 0.5 10*3/uL (ref 0.1–0.9)
MONO%: 6.6 % (ref 0.0–14.0)
NEUT%: 61.8 % (ref 38.4–76.8)
NEUTROS ABS: 4.6 10*3/uL (ref 1.5–6.5)
PLATELETS: 340 10*3/uL (ref 145–400)
RBC: 4.52 10*6/uL (ref 3.70–5.45)
RDW: 14.1 % (ref 11.2–14.5)
WBC: 7.4 10*3/uL (ref 3.9–10.3)

## 2015-11-12 LAB — COMPREHENSIVE METABOLIC PANEL
ALBUMIN: 4.1 g/dL (ref 3.5–5.0)
ALK PHOS: 79 U/L (ref 40–150)
ALT: 17 U/L (ref 0–55)
ANION GAP: 11 meq/L (ref 3–11)
AST: 17 U/L (ref 5–34)
BILIRUBIN TOTAL: 0.89 mg/dL (ref 0.20–1.20)
BUN: 7.7 mg/dL (ref 7.0–26.0)
CALCIUM: 9.6 mg/dL (ref 8.4–10.4)
CO2: 25 mEq/L (ref 22–29)
CREATININE: 0.8 mg/dL (ref 0.6–1.1)
Chloride: 106 mEq/L (ref 98–109)
Glucose: 104 mg/dl (ref 70–140)
Potassium: 4.3 mEq/L (ref 3.5–5.1)
Sodium: 141 mEq/L (ref 136–145)
TOTAL PROTEIN: 7.8 g/dL (ref 6.4–8.3)

## 2015-11-12 NOTE — Telephone Encounter (Signed)
per pof to sch pt appt-gave pt copy of avs °

## 2015-11-12 NOTE — Progress Notes (Signed)
[place]  [phone] Fax:([phone]) [phone]  Clinic follow Up Note   Patient Care Team: Kristie Cowman, MD as PCP - General (Family Medicine) Stark Klein, MD as Consulting Physician (General Surgery) Truitt Merle, MD as Consulting Physician (Hematology) Arloa Koh, MD as Consulting Physician (Radiation Oncology) Mauro Kaufmann, RN as Registered Nurse Rockwell Germany, RN as Registered Nurse Sylvan Cheese, NP as Nurse Practitioner (Nurse Practitioner) 11/12/2015  CHIEF COMPLAINTS:  Follow up right breast cancer  Oncology History   Breast cancer of upper-outer quadrant of right female breast [facility])   Staging form: Breast, AJCC 7th Edition     Clinical stage from 09/15/2015: Stage IA (T1b, N0, M0) - Unsigned       Breast cancer of upper-outer quadrant of right female breast ([place])   09/07/2015 Mammogram Diagnostic mammogram and the ultrasound of the right breast showed a 0.8 cm lobulated mass in the right breast 11 to 12:00 position 9 cm from the nipple. No other lesions or adenopathy.   09/08/2015 Initial Diagnosis Breast cancer of upper-outer quadrant of right female breast ([place])   09/08/2015 Initial Biopsy Right breast mass at the upper outer quadrant core needle biopsy showed invasive ductal carcinoma, grade 2-3, ductal carcinoma in situ.   09/08/2015 Receptors her2 ER 100% positive, PR 100% positive, Ki-67 60%, HER-2 positive with copy # 4.25 and ratio 2.58    HISTORY OF PRESENTING ILLNESS:  Sandra James 49 y.o. female with past medical history of stage I left breast cancer, is here because of newly diagnosed right breast cancer. She presents to our multidisciplinary breast clinic by herself.  The right breast cancer was discovered by screening mammogram. She did not have any palpable mass, or any constitutional symptoms. She was diagnosed with stage I left breast cancer at age of 17, she had lumpectomy, radiation, and adjuvant chemotherapy and 5  years of tamoxifen. She was treated by Dr. Louann Sjogren in [place]. She moved to [facility] about year ago due to job change. She has been very compliant with annual screening mammogram.  She had hysterectomy and bilateral oophorectomy and sphincterectomy 5 years ago for heavy bleeding.. She has been having hot flashes since then, moderate, but manageable. She is single, lives alone, works for 2 to [place] has to go up children who live in [place].  INTERIM HISTORY Sandra James returns for follow-up. She tried the Letrozole for a few times after her visit with me a months ago, but could not tolerate. She complained of dizziness, depression, agitation and difficulty focusing. So she stopped. She noticed intermittent right breast pain in the past few days, no swelling or skin change, no injury.  She otherwise doing well. Her breast surgery is scheduled for December 15.  MEDICAL HISTORY:  Past Medical History  Diagnosis Date  . Complication of anesthesia     slow to wake up- BP was low, fainted next day  . MVP (mitral valve prolapse)   . Infection     UTI  . Anxiety   . Fibroid   . Cancer [facility])     left brast lumpectomy   . Breast cancer of upper-outer quadrant of right female breast ([place]) 09/09/2015  . Diabetes mellitus without complication ([place])   . Depression     SURGICAL HISTORY: Past Surgical History  Procedure Laterality Date  . Abdominal hysterectomy    . Breast surgery  2004    left lumpectomy  . Cholecystectomy      SOCIAL  HISTORY: Social History   Social History  . Marital Status: Single     Spouse Name: N/A  . Number of Children: 2 children, 44 daughter and 80 yo son    . Years of Education: N/A   Occupational History  . She is a Barista rep   Social History Main Topics  . Smoking status: Never Smoker   . Smokeless tobacco: Never Used  . Alcohol Use: Yes     Comment: 2-3/wk  . Drug Use: No  . Sexual Activity: Yes    Birth Control/ Protection: Surgical     Other Topics Concern  . Not on file   Social History Narrative    FAMILY HISTORY: Family History  Problem Relation Age of Onset  . Hypertension Mother   . Stroke Mother   . Alzheimer's disease Mother   . Heart disease Father   . Stroke Father   . Heart attack Father   . Breast cancer Sister 73    double mastectomy  . Other Sister     "stomach tumor that wrapped around reproductive organs"; dx. 67s; required partial hysterectomy  . Alzheimer's disease Maternal Grandmother   . Alzheimer's disease Paternal Grandmother   . Other Other     had to have lymph nodes removed and radiation; dx. 19s  . Cancer Maternal Aunt     unspecified type; dx. <50  . Colon cancer Maternal Uncle     dx. 64s  . Breast cancer Cousin     dx. 69s    ALLERGIES:  is allergic to lisinopril; oxycodone; and percocet.  MEDICATIONS:  Current Outpatient Prescriptions  Medication Sig Dispense Refill  . letrozole (FEMARA) 2.5 MG tablet Take 1 tablet (2.5 mg total) by mouth daily. 30 tablet 1  . metFORMIN (GLUCOPHAGE) 500 MG tablet 500 mg 2 (two) times daily with a meal.   0  . omeprazole (PRILOSEC) 20 MG capsule Take 1 capsule (20 mg total) by mouth daily. (Patient taking differently: Take 20 mg by mouth as needed. As needed) 14 capsule 0  . cephALEXin (KEFLEX) 500 MG capsule Take 500 mg by mouth.    Marland Kitchen HYDROcodone-acetaminophen (NORCO) 5-325 MG tablet Take 1 tablet by mouth.     No current facility-administered medications for this visit.    REVIEW OF SYSTEMS:   Constitutional: Denies fevers, chills or abnormal night sweats Eyes: Denies blurriness of vision, double vision or watery eyes Ears, nose, mouth, throat, and face: Denies mucositis or sore throat Respiratory: Denies cough, dyspnea or wheezes Cardiovascular: Denies palpitation, chest discomfort or lower extremity swelling Gastrointestinal:  Denies nausea, heartburn or change in bowel habits Skin: Denies abnormal skin rashes Lymphatics:  Denies new lymphadenopathy or easy bruising Neurological:Denies numbness, tingling or new weaknesses Behavioral/Psych: Mood is stable, no new changes  All other systems were reviewed with the patient and are negative.  PHYSICAL EXAMINATION: ECOG PERFORMANCE STATUS: 0 - Asymptomatic  Filed Vitals:   11/12/15 0904  BP: 111/68  Pulse: 78  Temp: 98.5 F (36.9 C)  Resp: 18   Filed Weights   11/12/15 0904  Weight: 161 lb 8 oz (73.256 kg)    GENERAL:alert, no distress and comfortable SKIN: skin color, texture, turgor are normal, no rashes or significant lesions EYES: normal, conjunctiva are pink and non-injected, sclera clear OROPHARYNX:no exudate, no erythema and lips, buccal mucosa, and tongue normal  NECK: supple, thyroid normal size, non-tender, without nodularity LYMPH:  no palpable lymphadenopathy in the cervical, axillary or inguinal LUNGS: clear to auscultation  and percussion with normal breathing effort HEART: regular rate & rhythm and no murmurs and no lower extremity edema ABDOMEN:abdomen soft, non-tender and normal bowel sounds Musculoskeletal:no cyanosis of digits and no clubbing  PSYCH: alert & oriented x 3 with fluent speech NEURO: no focal motor/sensory deficits Breasts: Breast inspection showed them to be symmetrical with no nipple discharge. There is a 1.5cm mass at 11-12 o'clock position of the right breast, non-tender, no skin or nipple change. Palpation of the left breast and axilla revealed no obvious mass that I could appreciate.   LABORATORY DATA:  I have reviewed the data as listed CBC Latest Ref Rng 11/12/2015 09/15/2015 09/29/2014  WBC 3.9 - 10.3 10e3/uL 7.4 7.5 9.7  Hemoglobin 11.6 - 15.9 g/dL 13.8 13.9 14.5  Hematocrit 34.8 - 46.6 % 41.2 42.0 42.3  Platelets 145 - 400 10e3/uL 340 368 353    CMP Latest Ref Rng 11/12/2015 09/15/2015 09/29/2014  Glucose 70 - 140 mg/dl 104 122 99  BUN 7.0 - 26.0 mg/dL 7.7 7.8 7  Creatinine 0.6 - 1.1 mg/dL 0.8 0.8 0.56    Sodium 136 - 145 mEq/L 141 141 140  Potassium 3.5 - 5.1 mEq/L 4.3 3.6 4.2  Chloride 96 - 112 mEq/L - - 100  CO2 22 - 29 mEq/L 25 26 27   Calcium 8.4 - 10.4 mg/dL 9.6 9.4 9.7  Total Protein 6.4 - 8.3 g/dL 7.8 7.6 8.5(H)  Total Bilirubin 0.20 - 1.20 mg/dL 0.89 1.19 0.8  Alkaline Phos 40 - 150 U/L 79 80 100  AST 5 - 34 U/L 17 18 18   ALT 0 - 55 U/L 17 22 16      Pathology report Diagnosis 09/08/2015 Breast right needle core biopsy, upper, outer quadrant posterior depth -Invasive ductal carcinoma -Ductal carcinoma in situ  Microscopic comment The carcinoma appears grade 2-3.  Prognostic indicators ER; 100% positive strongly staining intensity Progesterone receptor; 100%, positive, strongly staining intensity Proliferation Marker Ki-67 60%  HER-2 positive with copy #4.25 and ratio 2.58  RADIOGRAPHIC STUDIES: I have personally reviewed the radiological images as listed and agreed with the findings in the report.  Ultrasound and diagnostic Mammogram 09/07/2015 Impression There is a 0.8 cm lobulated mass with an in distinct margin in the right breast at 11 to 12:00 9 cm from the nipple. This is a mild great concern for malignancy and the biopsy is recommended.  ASSESSMENT & PLAN:  49 year old African-American female, surgical postmenopausal, history of stage I left breast cancer, with newly diagnosed right stage I breast cancer.  1. Right breast invasive ductal carcinoma, T1bN0M0, stage I, grade 2-3, ER+/PR+/HER2+ -I previously reviewed her imaging findings and biopsy results in great details. -Giving the small size of her breast tumor, we recommend her to have breast surgery first. She was seen by Dr. Barry Dienes today. Due to her previous breast cancer history, patient has determined to have bilateral mastectomy, and possible reconstruction. -we discussed that HER-2 positive breast cancers are more aggressive, with higher risk of recurrence after surgery. Depends on the final surgical  staging of the tumor, and lymph nodes involvement, I would recommend adjuvant chemotherapy if tumor more than 1 cm, or positive lymph nodes. If the tumor is less than 1 cm, I will consider mammaprint test o further stratify her risk of recurrence, and determine about her adjuvant chemotherapy. -she previously has received Adriamycin-based chemotherapy. So if she needs chemotherapy, more likely will be TCHP or weekly Taxol and Herceptin,  Followed by maintenance Herceptin to complete a 1  year therapy -Given her ER/PR positive disease, and post menopausal status, I recommend her to take aromatase inhibitor for 5-10 years. -She tried neoadjuvant letrozole, tolerated very poorly, and this stopped after a few doses. We discussed the other aromatase inhibitor, or tamoxifen which she tolerated before. -her surgery is scheduled for 12/15, she will have bilateral mastectomy and reconstruction with implant -I'll see her 1 week after her surgery, to go over her surgical pathology results, and finalize if she would need adjuvant chemotherapy -She would need a port placement if she needs adjuvant chemotherapy, this will be done after her breast surgery   2. Genetics -Due to her young age and recurrent breast cancer, she was referred to see a genetic counselor in our cancer center. -Her genetic test was negative  3. DM -she will continue follow-up with her primary care physician -She is on metformin  4. Bone health -She is postmenopausal by surgery -I encouraged her to take calcium and vitamin D -I'll obtain a baseline bone density scan in the next 2-3 months.  Plan -I'll see her on December 22, 1 week after her breast surgery.  All questions were answered. The patient knows to call the clinic with any problems, questions or concerns. I spent 15 minutes counseling the patient face to face. The total time spent in the appointment was 20 minutes and more than 50% was on counseling.     Truitt Merle,  MD 11/12/2015 10:24 AM

## 2015-11-13 ENCOUNTER — Encounter (HOSPITAL_COMMUNITY): Payer: Self-pay

## 2015-11-19 ENCOUNTER — Encounter (HOSPITAL_BASED_OUTPATIENT_CLINIC_OR_DEPARTMENT_OTHER): Payer: Self-pay | Admitting: *Deleted

## 2015-11-20 ENCOUNTER — Other Ambulatory Visit: Payer: Self-pay | Admitting: Plastic Surgery

## 2015-11-20 DIAGNOSIS — C50919 Malignant neoplasm of unspecified site of unspecified female breast: Secondary | ICD-10-CM

## 2015-11-23 ENCOUNTER — Other Ambulatory Visit: Payer: Self-pay | Admitting: Plastic Surgery

## 2015-11-24 ENCOUNTER — Encounter (HOSPITAL_BASED_OUTPATIENT_CLINIC_OR_DEPARTMENT_OTHER): Payer: Self-pay | Admitting: General Surgery

## 2015-11-24 ENCOUNTER — Encounter (HOSPITAL_BASED_OUTPATIENT_CLINIC_OR_DEPARTMENT_OTHER)
Admission: RE | Admit: 2015-11-24 | Discharge: 2015-11-24 | Disposition: A | Discharge status: Home / Self Care | Payer: BLUE CROSS/BLUE SHIELD | Source: Ambulatory Visit | Attending: General Surgery | Admitting: General Surgery

## 2015-11-24 ENCOUNTER — Other Ambulatory Visit: Payer: Self-pay

## 2015-11-24 DIAGNOSIS — I341 Nonrheumatic mitral (valve) prolapse: Secondary | ICD-10-CM | POA: Diagnosis not present

## 2015-11-24 DIAGNOSIS — F419 Anxiety disorder, unspecified: Secondary | ICD-10-CM | POA: Diagnosis not present

## 2015-11-24 DIAGNOSIS — C773 Secondary and unspecified malignant neoplasm of axilla and upper limb lymph nodes: Secondary | ICD-10-CM | POA: Diagnosis not present

## 2015-11-24 DIAGNOSIS — Z853 Personal history of malignant neoplasm of breast: Secondary | ICD-10-CM | POA: Diagnosis not present

## 2015-11-24 DIAGNOSIS — N6032 Fibrosclerosis of left breast: Secondary | ICD-10-CM | POA: Diagnosis not present

## 2015-11-24 DIAGNOSIS — C50411 Malignant neoplasm of upper-outer quadrant of right female breast: Secondary | ICD-10-CM | POA: Diagnosis not present

## 2015-11-24 DIAGNOSIS — Z888 Allergy status to other drugs, medicaments and biological substances status: Secondary | ICD-10-CM | POA: Diagnosis not present

## 2015-11-24 DIAGNOSIS — E119 Type 2 diabetes mellitus without complications: Secondary | ICD-10-CM | POA: Diagnosis not present

## 2015-11-24 DIAGNOSIS — Z7984 Long term (current) use of oral hypoglycemic drugs: Secondary | ICD-10-CM | POA: Diagnosis not present

## 2015-11-24 DIAGNOSIS — D242 Benign neoplasm of left breast: Secondary | ICD-10-CM | POA: Diagnosis not present

## 2015-11-24 DIAGNOSIS — F329 Major depressive disorder, single episode, unspecified: Secondary | ICD-10-CM | POA: Diagnosis not present

## 2015-11-24 DIAGNOSIS — Z885 Allergy status to narcotic agent status: Secondary | ICD-10-CM | POA: Diagnosis not present

## 2015-11-24 DIAGNOSIS — K219 Gastro-esophageal reflux disease without esophagitis: Secondary | ICD-10-CM | POA: Diagnosis not present

## 2015-11-24 NOTE — H&P (Signed)
  [place] Location: [facility] Patient #: [id number] DOB: [birth year] Single / Language: Sandra James / Race: Black or African American Female   History of Present Illness The patient is a 49 year old female who presents with breast cancer. Previous history Sandra James is a 49 year old female referred by Dr. Ottie James for consultation regarding a new right breast cancer. The patient presented with screening detected calcifications on the right. This area was between 11 and 12:00 and was approximately 0.8 cm. 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 No lymphadenopathy was appreciated on mammogram or ultrasound.  Of note, the patient does have a history of left breast cancer previously. She had a lumpectomy with what sounds like a level one axillary lymph node dissection, radiation therapy, and chemotherapy. She desires bilateral mastectomies at this point. She did have genetic counseling with BRCA testing, however this was 12-14 years ago. Her previous breast cancer was treated in [place], [place].  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 She has 2 children that live in [place]. She does not smoke and uses alcohol only rarely. She does not have any other illicit drug use.  Menarche was at age 39. She is not having periods as she is status post hysterectomy with oophorectomy. She did not use any hormonal contraception. She has had 2 children with the first at age 89. She has not had any colonoscopy or bone density based on her age.]  The patient does desire bilateral mastectomies wtih reconstruction. She saw Dr. Marla James, and her surgery date is December. She is very anxious about this and wanted to discuss her options.   Allergies  Percocet *ANALGESICS - OPIOID*  Medication History  PriLOSEC ('20MG'$  Capsule DR, Oral) Active. No Current  Medications (Taken starting 10/08/2015) MetFORMIN HCl ('500MG'$  Tablet, Oral) Active. Medications Reconciled    Review of Systems All other systems negative  Vitals Weight: 160 lb Height: 66in Body Surface Area: 1.82 m Body Mass Index: 25.82 kg/m  Temp.: 97.52F(Temporal)  Pulse: 90 (Regular)  BP: 132/68 (Sitting, Left Arm, Standard)       Physical Exam General Mental Status-Alert. General Appearance-Consistent with stated age. Hydration-Well hydrated. Voice-Normal.  Breast Note: Nochange in exam. There is no palpable mass in either breast. The left breast has postoperative changes from her previous breast cancer and radiation.     Assessment & Plan CARCINOMA OF BREAST UPPER OUTER QUADRANT, RIGHT (C50.411) Impression: I discussed the possible options with the patient. I also reviewed the patient with Dr. Burr James. Her choices are to have mastectomies alone without reconstruction and have reconstruction at a later date. Her other choices to have a lumpectomy on the right, but then I think it would be difficult to get insurance to pay for her mastectomy later. I think the best option would be to start on tamoxifen neoadjuvant only. This way we would keep the December date. But she would also be actively getting treated. She is going to see Dr. Burr James next week to discuss this option. I'm not sure how this will plan to the HER-2 positivity of the tumor. Current Plans Pt Education - [facility] Free Text Education/Instructions: discussed with patient and provided information.   Signed by Sandra Klein, MD

## 2015-11-25 ENCOUNTER — Ambulatory Visit (HOSPITAL_COMMUNITY)
Admission: RE | Admit: 2015-11-25 | Discharge: 2015-11-25 | Disposition: A | Discharge status: Home / Self Care | Payer: BLUE CROSS/BLUE SHIELD | Source: Ambulatory Visit | Attending: General Surgery | Admitting: General Surgery

## 2015-11-25 ENCOUNTER — Ambulatory Visit (HOSPITAL_BASED_OUTPATIENT_CLINIC_OR_DEPARTMENT_OTHER)
Admission: RE | Admit: 2015-11-25 | Discharge: 2015-11-26 | Disposition: A | Discharge status: Home / Self Care | Payer: BLUE CROSS/BLUE SHIELD | Source: Ambulatory Visit | Attending: Plastic Surgery | Admitting: Plastic Surgery

## 2015-11-25 ENCOUNTER — Ambulatory Visit (HOSPITAL_BASED_OUTPATIENT_CLINIC_OR_DEPARTMENT_OTHER): Payer: BLUE CROSS/BLUE SHIELD | Admitting: Anesthesiology

## 2015-11-25 ENCOUNTER — Encounter (HOSPITAL_BASED_OUTPATIENT_CLINIC_OR_DEPARTMENT_OTHER)
Admission: RE | Disposition: A | Discharge status: Home / Self Care | Payer: Self-pay | Source: Ambulatory Visit | Attending: Plastic Surgery

## 2015-11-25 ENCOUNTER — Encounter (HOSPITAL_BASED_OUTPATIENT_CLINIC_OR_DEPARTMENT_OTHER): Payer: Self-pay

## 2015-11-25 DIAGNOSIS — Z7984 Long term (current) use of oral hypoglycemic drugs: Secondary | ICD-10-CM | POA: Insufficient documentation

## 2015-11-25 DIAGNOSIS — I341 Nonrheumatic mitral (valve) prolapse: Secondary | ICD-10-CM | POA: Insufficient documentation

## 2015-11-25 DIAGNOSIS — D242 Benign neoplasm of left breast: Secondary | ICD-10-CM | POA: Insufficient documentation

## 2015-11-25 DIAGNOSIS — C50919 Malignant neoplasm of unspecified site of unspecified female breast: Secondary | ICD-10-CM

## 2015-11-25 DIAGNOSIS — F419 Anxiety disorder, unspecified: Secondary | ICD-10-CM | POA: Insufficient documentation

## 2015-11-25 DIAGNOSIS — E119 Type 2 diabetes mellitus without complications: Secondary | ICD-10-CM | POA: Insufficient documentation

## 2015-11-25 DIAGNOSIS — Z853 Personal history of malignant neoplasm of breast: Secondary | ICD-10-CM | POA: Insufficient documentation

## 2015-11-25 DIAGNOSIS — C50411 Malignant neoplasm of upper-outer quadrant of right female breast: Secondary | ICD-10-CM

## 2015-11-25 DIAGNOSIS — C773 Secondary and unspecified malignant neoplasm of axilla and upper limb lymph nodes: Secondary | ICD-10-CM | POA: Insufficient documentation

## 2015-11-25 DIAGNOSIS — Z888 Allergy status to other drugs, medicaments and biological substances status: Secondary | ICD-10-CM | POA: Insufficient documentation

## 2015-11-25 DIAGNOSIS — Z885 Allergy status to narcotic agent status: Secondary | ICD-10-CM | POA: Insufficient documentation

## 2015-11-25 DIAGNOSIS — F329 Major depressive disorder, single episode, unspecified: Secondary | ICD-10-CM | POA: Insufficient documentation

## 2015-11-25 DIAGNOSIS — K219 Gastro-esophageal reflux disease without esophagitis: Secondary | ICD-10-CM | POA: Insufficient documentation

## 2015-11-25 DIAGNOSIS — N6032 Fibrosclerosis of left breast: Secondary | ICD-10-CM | POA: Insufficient documentation

## 2015-11-25 HISTORY — PX: MASTECTOMY W/ SENTINEL NODE BIOPSY: SHX2001

## 2015-11-25 HISTORY — DX: Gastro-esophageal reflux disease without esophagitis: K21.9

## 2015-11-25 HISTORY — PX: BREAST RECONSTRUCTION WITH PLACEMENT OF TISSUE EXPANDER AND FLEX HD (ACELLULAR HYDRATED DERMIS): SHX6295

## 2015-11-25 LAB — GLUCOSE, CAPILLARY
GLUCOSE-CAPILLARY: 85 mg/dL (ref 65–99)
Glucose-Capillary: 157 mg/dL — ABNORMAL HIGH (ref 65–99)

## 2015-11-25 SURGERY — MASTECTOMY WITH SENTINEL LYMPH NODE BIOPSY
Anesthesia: Regional | Site: Breast | Laterality: Bilateral

## 2015-11-25 MED ORDER — SUFENTANIL CITRATE 50 MCG/ML IV SOLN
INTRAVENOUS | Status: AC
  Filled 2015-11-25: qty 1

## 2015-11-25 MED ORDER — BUPIVACAINE-EPINEPHRINE (PF) 0.25% -1:200000 IJ SOLN
INTRAMUSCULAR | Status: AC
  Filled 2015-11-25: qty 30

## 2015-11-25 MED ORDER — ONDANSETRON HCL 4 MG/2ML IJ SOLN: 4.0000 mg | Freq: Four times a day (QID) | INTRAMUSCULAR | Status: DC | PRN

## 2015-11-25 MED ORDER — HYDROMORPHONE HCL 1 MG/ML IJ SOLN
0.2500 mg | INTRAMUSCULAR | Status: DC | PRN
  Administered 2015-11-25 (×4): 0.5 mg via INTRAVENOUS

## 2015-11-25 MED ORDER — DEXAMETHASONE SODIUM PHOSPHATE 10 MG/ML IJ SOLN
INTRAMUSCULAR | Status: AC
  Filled 2015-11-25: qty 1

## 2015-11-25 MED ORDER — SODIUM CHLORIDE 0.9 % IJ SOLN
INTRAMUSCULAR | Status: AC
  Filled 2015-11-25: qty 10

## 2015-11-25 MED ORDER — METHYLENE BLUE 1 % INJ SOLN
INTRAMUSCULAR | Status: AC
  Filled 2015-11-25: qty 10

## 2015-11-25 MED ORDER — HYDROCODONE-ACETAMINOPHEN 7.5-325 MG PO TABS: 1.0000 | ORAL_TABLET | Freq: Once | ORAL | Status: DC | PRN

## 2015-11-25 MED ORDER — GLYCOPYRROLATE 0.2 MG/ML IJ SOLN: 0.2000 mg | Freq: Once | INTRAMUSCULAR | Status: DC | PRN

## 2015-11-25 MED ORDER — DEXAMETHASONE SODIUM PHOSPHATE 4 MG/ML IJ SOLN
INTRAMUSCULAR | Status: DC | PRN
  Administered 2015-11-25: 10 mg via INTRAVENOUS

## 2015-11-25 MED ORDER — SUFENTANIL CITRATE 50 MCG/ML IV SOLN
INTRAVENOUS | Status: DC | PRN
  Administered 2015-11-25: 5 ug via INTRAVENOUS
  Administered 2015-11-25 (×2): 10 ug via INTRAVENOUS
  Administered 2015-11-25: 5 ug via INTRAVENOUS
  Administered 2015-11-25: 10 ug via INTRAVENOUS
  Administered 2015-11-25: 15 ug via INTRAVENOUS

## 2015-11-25 MED ORDER — POLYETHYLENE GLYCOL 3350 17 G PO PACK: 17.0000 g | PACK | Freq: Every day | ORAL | Status: DC | PRN

## 2015-11-25 MED ORDER — KETOROLAC TROMETHAMINE 30 MG/ML IJ SOLN: 30.0000 mg | Freq: Once | INTRAMUSCULAR | Status: DC

## 2015-11-25 MED ORDER — MORPHINE SULFATE (PF) 2 MG/ML IV SOLN: 2.0000 mg | INTRAVENOUS | Status: DC | PRN

## 2015-11-25 MED ORDER — HYDROMORPHONE HCL 1 MG/ML IJ SOLN
INTRAMUSCULAR | Status: AC
  Filled 2015-11-25: qty 1

## 2015-11-25 MED ORDER — PHENYLEPHRINE HCL 10 MG/ML IJ SOLN
10.0000 mg | INTRAVENOUS | Status: DC | PRN
  Administered 2015-11-25: 40 ug/min via INTRAVENOUS

## 2015-11-25 MED ORDER — FENTANYL CITRATE (PF) 100 MCG/2ML IJ SOLN
INTRAMUSCULAR | Status: AC
  Filled 2015-11-25: qty 2

## 2015-11-25 MED ORDER — NAPROXEN 500 MG PO TABS: 500.0000 mg | ORAL_TABLET | Freq: Two times a day (BID) | ORAL | Status: DC | PRN

## 2015-11-25 MED ORDER — LIDOCAINE HCL (CARDIAC) 20 MG/ML IV SOLN
INTRAVENOUS | Status: DC | PRN
  Administered 2015-11-25: 50 mg via INTRAVENOUS

## 2015-11-25 MED ORDER — KCL IN DEXTROSE-NACL 20-5-0.45 MEQ/L-%-% IV SOLN
INTRAVENOUS | Status: DC
  Administered 2015-11-25: 15:00:00 via INTRAVENOUS
  Filled 2015-11-25: qty 1000

## 2015-11-25 MED ORDER — HYDROMORPHONE HCL 1 MG/ML IJ SOLN
0.5000 mg | INTRAMUSCULAR | Status: DC | PRN
  Administered 2015-11-25: 0.5 mg via INTRAVENOUS

## 2015-11-25 MED ORDER — HYDROCODONE-ACETAMINOPHEN 5-325 MG PO TABS
1.0000 | ORAL_TABLET | ORAL | Status: DC | PRN
  Administered 2015-11-25 (×2): 1 via ORAL
  Administered 2015-11-26 (×2): 2 via ORAL
  Filled 2015-11-25 (×2): qty 2
  Filled 2015-11-25: qty 1
  Filled 2015-11-25: qty 2

## 2015-11-25 MED ORDER — MIDAZOLAM HCL 2 MG/2ML IJ SOLN
INTRAMUSCULAR | Status: AC
  Filled 2015-11-25: qty 2

## 2015-11-25 MED ORDER — BUPIVACAINE-EPINEPHRINE (PF) 0.25% -1:200000 IJ SOLN
INTRAMUSCULAR | Status: DC | PRN
  Administered 2015-11-25 (×2): 30 mL

## 2015-11-25 MED ORDER — DIPHENHYDRAMINE HCL 25 MG PO CAPS
25.0000 mg | ORAL_CAPSULE | Freq: Four times a day (QID) | ORAL | Status: DC | PRN
  Administered 2015-11-25: 25 mg via ORAL
  Filled 2015-11-25: qty 1

## 2015-11-25 MED ORDER — LIDOCAINE HCL (CARDIAC) 20 MG/ML IV SOLN
INTRAVENOUS | Status: AC
  Filled 2015-11-25: qty 5

## 2015-11-25 MED ORDER — ONDANSETRON HCL 4 MG/2ML IJ SOLN
INTRAMUSCULAR | Status: DC | PRN
  Administered 2015-11-25: 4 mg via INTRAVENOUS

## 2015-11-25 MED ORDER — PHENYLEPHRINE HCL 10 MG/ML IJ SOLN
INTRAMUSCULAR | Status: DC | PRN
  Administered 2015-11-25: 40 ug via INTRAVENOUS

## 2015-11-25 MED ORDER — PROPOFOL 500 MG/50ML IV EMUL
INTRAVENOUS | Status: AC
  Filled 2015-11-25: qty 50

## 2015-11-25 MED ORDER — SCOPOLAMINE 1 MG/3DAYS TD PT72: 1.0000 | MEDICATED_PATCH | Freq: Once | TRANSDERMAL | Status: DC

## 2015-11-25 MED ORDER — FENTANYL CITRATE (PF) 100 MCG/2ML IJ SOLN
50.0000 ug | INTRAMUSCULAR | Status: DC | PRN
  Administered 2015-11-25 (×2): 100 ug via INTRAVENOUS

## 2015-11-25 MED ORDER — SUFENTANIL CITRATE 50 MCG/ML IV SOLN
INTRAVENOUS | Status: AC
Start: 2015-11-25 — End: 2015-11-25
  Filled 2015-11-25: qty 1

## 2015-11-25 MED ORDER — DIPHENHYDRAMINE HCL 50 MG/ML IJ SOLN
INTRAMUSCULAR | Status: AC
  Filled 2015-11-25: qty 1

## 2015-11-25 MED ORDER — SUCCINYLCHOLINE CHLORIDE 20 MG/ML IJ SOLN
INTRAMUSCULAR | Status: AC
  Filled 2015-11-25: qty 1

## 2015-11-25 MED ORDER — EPHEDRINE SULFATE 50 MG/ML IJ SOLN
INTRAMUSCULAR | Status: AC
  Filled 2015-11-25: qty 1

## 2015-11-25 MED ORDER — SODIUM CHLORIDE 0.9 % IV SOLN
INTRAVENOUS | Status: DC | PRN
  Administered 2015-11-25: 200 mL via INTRAMUSCULAR

## 2015-11-25 MED ORDER — CEFAZOLIN SODIUM-DEXTROSE 2-3 GM-% IV SOLR
INTRAVENOUS | Status: AC
  Filled 2015-11-25: qty 50

## 2015-11-25 MED ORDER — MIDAZOLAM HCL 2 MG/2ML IJ SOLN
1.0000 mg | INTRAMUSCULAR | Status: DC | PRN
  Administered 2015-11-25 (×3): 2 mg via INTRAVENOUS

## 2015-11-25 MED ORDER — CEFAZOLIN SODIUM-DEXTROSE 2-3 GM-% IV SOLR
2.0000 g | Freq: Three times a day (TID) | INTRAVENOUS | Status: DC
  Administered 2015-11-25 – 2015-11-26 (×2): 2 g via INTRAVENOUS
  Filled 2015-11-25 (×2): qty 50

## 2015-11-25 MED ORDER — CEFAZOLIN SODIUM-DEXTROSE 2-3 GM-% IV SOLR
2.0000 g | INTRAVENOUS | Status: AC
  Administered 2015-11-25: 2 g via INTRAVENOUS

## 2015-11-25 MED ORDER — PHENYLEPHRINE 40 MCG/ML (10ML) SYRINGE FOR IV PUSH (FOR BLOOD PRESSURE SUPPORT)
PREFILLED_SYRINGE | INTRAVENOUS | Status: AC
  Filled 2015-11-25: qty 10

## 2015-11-25 MED ORDER — ONDANSETRON HCL 4 MG/2ML IJ SOLN
INTRAMUSCULAR | Status: AC
  Filled 2015-11-25: qty 2

## 2015-11-25 MED ORDER — ONDANSETRON 4 MG PO TBDP: 4.0000 mg | ORAL_TABLET | Freq: Four times a day (QID) | ORAL | Status: DC | PRN

## 2015-11-25 MED ORDER — PANTOPRAZOLE SODIUM 40 MG PO TBEC: 40.0000 mg | DELAYED_RELEASE_TABLET | Freq: Every day | ORAL | Status: DC

## 2015-11-25 MED ORDER — METFORMIN HCL 500 MG PO TABS
500.0000 mg | ORAL_TABLET | Freq: Two times a day (BID) | ORAL | Status: DC
  Administered 2015-11-25: 500 mg via ORAL
  Filled 2015-11-25: qty 1

## 2015-11-25 MED ORDER — LACTATED RINGERS IV SOLN
INTRAVENOUS | Status: DC
  Administered 2015-11-25: 07:00:00 via INTRAVENOUS

## 2015-11-25 MED ORDER — TECHNETIUM TC 99M SULFUR COLLOID FILTERED
1.0000 | Freq: Once | INTRAVENOUS | Status: AC | PRN
  Administered 2015-11-25: 1 via INTRADERMAL

## 2015-11-25 MED ORDER — SUCCINYLCHOLINE CHLORIDE 20 MG/ML IJ SOLN
INTRAMUSCULAR | Status: DC | PRN
  Administered 2015-11-25: 100 mg via INTRAVENOUS

## 2015-11-25 MED ORDER — DIAZEPAM 2 MG PO TABS
2.0000 mg | ORAL_TABLET | Freq: Two times a day (BID) | ORAL | Status: DC | PRN
  Administered 2015-11-25: 2 mg via ORAL
  Filled 2015-11-25: qty 1

## 2015-11-25 MED ORDER — DIPHENHYDRAMINE HCL 50 MG/ML IJ SOLN
12.5000 mg | Freq: Once | INTRAMUSCULAR | Status: AC
  Administered 2015-11-25: 12.5 mg via INTRAVENOUS

## 2015-11-25 MED ORDER — PROMETHAZINE HCL 25 MG/ML IJ SOLN: 6.2500 mg | INTRAMUSCULAR | Status: DC | PRN

## 2015-11-25 MED ORDER — DIPHENHYDRAMINE HCL 50 MG/ML IJ SOLN
25.0000 mg | Freq: Four times a day (QID) | INTRAMUSCULAR | Status: DC | PRN
  Administered 2015-11-25: 25 mg via INTRAVENOUS
  Filled 2015-11-25: qty 1

## 2015-11-25 MED ORDER — ATROPINE SULFATE 0.4 MG/ML IJ SOLN
INTRAMUSCULAR | Status: AC
  Filled 2015-11-25: qty 1

## 2015-11-25 MED ORDER — SODIUM CHLORIDE 0.9 % IR SOLN
Status: DC | PRN
  Administered 2015-11-25: 500 mL

## 2015-11-25 SURGICAL SUPPLY — 104 items
BAG DECANTER FOR FLEXI CONT (MISCELLANEOUS) ×3 IMPLANT
BINDER BREAST LRG (GAUZE/BANDAGES/DRESSINGS) ×3 IMPLANT
BINDER BREAST MEDIUM (GAUZE/BANDAGES/DRESSINGS) IMPLANT
BINDER BREAST XLRG (GAUZE/BANDAGES/DRESSINGS) IMPLANT
BINDER BREAST XXLRG (GAUZE/BANDAGES/DRESSINGS) IMPLANT
BIOPATCH RED 1 DISK 7.0 (GAUZE/BANDAGES/DRESSINGS) IMPLANT
BIOPATCH RED 1IN DISK 7.0MM (GAUZE/BANDAGES/DRESSINGS)
BLADE HEX COATED 2.75 (ELECTRODE) ×3 IMPLANT
BLADE SURG 10 STRL SS (BLADE) ×6 IMPLANT
BLADE SURG 15 STRL LF DISP TIS (BLADE) ×1 IMPLANT
BLADE SURG 15 STRL SS (BLADE) ×2
BNDG GAUZE ELAST 4 BULKY (GAUZE/BANDAGES/DRESSINGS) ×6 IMPLANT
CANISTER SUCT 1200ML W/VALVE (MISCELLANEOUS) ×3 IMPLANT
CHLORAPREP W/TINT 26ML (MISCELLANEOUS) ×3 IMPLANT
CLIP TI LARGE 6 (CLIP) ×3 IMPLANT
CLIP TI MEDIUM 6 (CLIP) ×6 IMPLANT
CLIP TI WIDE RED SMALL 6 (CLIP) IMPLANT
CLOSURE WOUND 1/2 X4 (GAUZE/BANDAGES/DRESSINGS)
COVER BACK TABLE 60X90IN (DRAPES) ×3 IMPLANT
COVER MAYO STAND STRL (DRAPES) ×6 IMPLANT
COVER PROBE W GEL 5X96 (DRAPES) ×3 IMPLANT
COVER SURGICAL LIGHT HANDLE (MISCELLANEOUS) ×3 IMPLANT
DECANTER SPIKE VIAL GLASS SM (MISCELLANEOUS) IMPLANT
DEVICE DISSECT PLASMABLAD 3.0S (MISCELLANEOUS) IMPLANT
DRAIN CHANNEL 19F RND (DRAIN) ×6 IMPLANT
DRAPE LAPAROSCOPIC ABDOMINAL (DRAPES) IMPLANT
DRAPE UTILITY XL STRL (DRAPES) ×6 IMPLANT
DRSG PAD ABDOMINAL 8X10 ST (GAUZE/BANDAGES/DRESSINGS) ×12 IMPLANT
ELECT BLADE 4.0 EZ CLEAN MEGAD (MISCELLANEOUS) ×6
ELECT REM PT RETURN 9FT ADLT (ELECTROSURGICAL) ×3
ELECTRODE BLDE 4.0 EZ CLN MEGD (MISCELLANEOUS) ×2 IMPLANT
ELECTRODE REM PT RTRN 9FT ADLT (ELECTROSURGICAL) ×1 IMPLANT
EVACUATOR SILICONE 100CC (DRAIN) ×6 IMPLANT
GAUZE SPONGE 4X4 12PLY STRL (GAUZE/BANDAGES/DRESSINGS) ×3 IMPLANT
GLOVE BIO SURGEON STRL SZ 6 (GLOVE) ×3 IMPLANT
GLOVE BIO SURGEON STRL SZ 6.5 (GLOVE) ×6 IMPLANT
GLOVE BIO SURGEONS STRL SZ 6.5 (GLOVE) ×3
GLOVE BIOGEL PI IND STRL 6.5 (GLOVE) IMPLANT
GLOVE BIOGEL PI IND STRL 8 (GLOVE) ×2 IMPLANT
GLOVE BIOGEL PI INDICATOR 6.5 (GLOVE)
GLOVE BIOGEL PI INDICATOR 8 (GLOVE) ×4
GLOVE SURG SS PI 7.0 STRL IVOR (GLOVE) ×6 IMPLANT
GLOVE SURG SS PI 7.5 STRL IVOR (GLOVE) ×3 IMPLANT
GLOVE SURG SS PI 8.0 STRL IVOR (GLOVE) ×3 IMPLANT
GOWN STRL REUS W/ TWL LRG LVL3 (GOWN DISPOSABLE) ×4 IMPLANT
GOWN STRL REUS W/ TWL XL LVL3 (GOWN DISPOSABLE) ×2 IMPLANT
GOWN STRL REUS W/TWL 2XL LVL3 (GOWN DISPOSABLE) ×3 IMPLANT
GOWN STRL REUS W/TWL LRG LVL3 (GOWN DISPOSABLE) ×8
GOWN STRL REUS W/TWL XL LVL3 (GOWN DISPOSABLE) ×4
GRAFT FLEX HD BILAT 4X16 THICK (Tissue Mesh) ×6 IMPLANT
ILLUMINATOR WAVEGUIDE N/F (MISCELLANEOUS) IMPLANT
IMPL BREAST TIS EXP M 350CC (Breast) ×2 IMPLANT
IMPLANT BREAST TIS EXP M 350CC (Breast) ×6 IMPLANT
IV NS 1000ML (IV SOLUTION) ×2
IV NS 1000ML BAXH (IV SOLUTION) ×1 IMPLANT
IV NS 500ML (IV SOLUTION)
IV NS 500ML BAXH (IV SOLUTION) IMPLANT
KIT FILL SYSTEM UNIVERSAL (SET/KITS/TRAYS/PACK) ×3 IMPLANT
LIGHT WAVEGUIDE WIDE FLAT (MISCELLANEOUS) ×3 IMPLANT
LIQUID BAND (GAUZE/BANDAGES/DRESSINGS) ×6 IMPLANT
MARKER SKIN DUAL TIP RULER LAB (MISCELLANEOUS) ×3 IMPLANT
NDL SAFETY ECLIPSE 18X1.5 (NEEDLE) IMPLANT
NEEDLE HYPO 18GX1.5 SHARP (NEEDLE)
NEEDLE HYPO 25X1 1.5 SAFETY (NEEDLE) ×3 IMPLANT
NEEDLE SPNL 18GX3.5 QUINCKE PK (NEEDLE) ×3 IMPLANT
NEEDLE SPNL 22GX3.5 QUINCKE BK (NEEDLE) IMPLANT
NS IRRIG 1000ML POUR BTL (IV SOLUTION) ×6 IMPLANT
PACK BASIN DAY SURGERY FS (CUSTOM PROCEDURE TRAY) ×3 IMPLANT
PACK UNIVERSAL I (CUSTOM PROCEDURE TRAY) ×3 IMPLANT
PENCIL BUTTON HOLSTER BLD 10FT (ELECTRODE) ×6 IMPLANT
PIN SAFETY STERILE (MISCELLANEOUS) ×3 IMPLANT
PLASMABLADE 3.0S (MISCELLANEOUS)
SET ASEPTIC TRANSFER (MISCELLANEOUS) ×6 IMPLANT
SLEEVE SCD COMPRESS KNEE MED (MISCELLANEOUS) ×3 IMPLANT
SPONGE LAP 18X18 X RAY DECT (DISPOSABLE) ×12 IMPLANT
STAPLER VISISTAT 35W (STAPLE) IMPLANT
STOCKINETTE IMPERVIOUS LG (DRAPES) ×3 IMPLANT
STRIP CLOSURE SKIN 1/2X4 (GAUZE/BANDAGES/DRESSINGS) IMPLANT
SUT ETHILON 2 0 FS 18 (SUTURE) IMPLANT
SUT MNCRL AB 4-0 PS2 18 (SUTURE) ×12 IMPLANT
SUT MON AB 3-0 SH 27 (SUTURE) ×2
SUT MON AB 3-0 SH27 (SUTURE) ×1 IMPLANT
SUT MON AB 5-0 PS2 18 (SUTURE) ×6 IMPLANT
SUT PDS 3-0 CT2 (SUTURE)
SUT PDS AB 2-0 CT2 27 (SUTURE) ×15 IMPLANT
SUT PDS II 3-0 CT2 27 ABS (SUTURE) IMPLANT
SUT SILK 0 TIES 10X30 (SUTURE) ×3 IMPLANT
SUT SILK 2 0 SH (SUTURE) ×3 IMPLANT
SUT SILK 3 0 PS 1 (SUTURE) ×6 IMPLANT
SUT VIC AB 3-0 SH 27 (SUTURE) ×4
SUT VIC AB 3-0 SH 27X BRD (SUTURE) ×2 IMPLANT
SUT VICRYL 3-0 CR8 SH (SUTURE) IMPLANT
SUT VICRYL AB 2 0 TIE (SUTURE) ×1 IMPLANT
SUT VICRYL AB 2 0 TIES (SUTURE) ×2
SYR 50ML LL SCALE MARK (SYRINGE) IMPLANT
SYR BULB 3OZ (MISCELLANEOUS) ×3 IMPLANT
SYR BULB IRRIGATION 50ML (SYRINGE) ×3 IMPLANT
SYR CONTROL 10ML LL (SYRINGE) ×3 IMPLANT
TOWEL OR 17X24 6PK STRL BLUE (TOWEL DISPOSABLE) ×12 IMPLANT
TOWEL OR NON WOVEN STRL DISP B (DISPOSABLE) IMPLANT
TUBE CONNECTING 20'X1/4 (TUBING) ×1
TUBE CONNECTING 20X1/4 (TUBING) ×2 IMPLANT
UNDERPAD 30X30 (UNDERPADS AND DIAPERS) ×6 IMPLANT
YANKAUER SUCT BULB TIP NO VENT (SUCTIONS) ×6 IMPLANT

## 2015-11-25 NOTE — Transfer of Care (Signed)
Immediate Anesthesia Transfer of Care Note  Patient: Sandra James  Procedure(s) Performed: Procedure(s): BILATERAL SKIN SPARING MASTECTOMIES WITH RIGHT SENTINEL LYMPH NODE BIOPSY (Bilateral) BILATERAL IMMEDIATE BREAST RECONSTRUCTION WITH PLACEMENT OF TISSUE EXPANDERS AND FLEX HD (ACELLULAR HYDRATED DERMIS) (Bilateral)  Patient Location: PACU  Anesthesia Type:GA combined with regional for post-op pain  Level of Consciousness: awake, alert  and oriented  Airway & Oxygen Therapy: Patient Spontanous Breathing and Patient connected to face mask oxygen  Post-op Assessment: Report given to RN and Post -op Vital signs reviewed and stable  Post vital signs: Reviewed and stable  Last Vitals:  Filed Vitals:   11/25/15 0745 11/25/15 1215  BP: 140/102   Pulse: 113 106  Temp:    Resp: 16 16    Complications: No apparent anesthesia complications

## 2015-11-25 NOTE — Anesthesia Procedure Notes (Addendum)
Anesthesia Regional Block:  Pectoralis block  Pre-Anesthetic Checklist: ,, timeout performed, Correct Patient, Correct Site, Correct Laterality, Correct Procedure, Correct Position, site marked, Risks and benefits discussed,  Surgical consent,  Pre-op evaluation,  At surgeon's request and post-op pain management  Laterality: Left and Right  Prep: chloraprep       Needles:  Injection technique: Single-shot  Needle Type: Echogenic Needle     Needle Length: 9cm 9 cm Needle Gauge: 21 and 21 G    Additional Needles:  Procedures: ultrasound guided (picture in chart) Pectoralis block Narrative:  Start time: 11/25/2015 7:30 AM End time: 11/25/2015 7:42 AM Injection made incrementally with aspirations every 5 mL.  Performed by: Personally  Anesthesiologist: Suzette Battiest   Procedure Name: Intubation Date/Time: 11/25/2015 7:56 AM Performed by: Melynda Ripple D Pre-anesthesia Checklist: Patient identified, Emergency Drugs available, Suction available and Patient being monitored Patient Re-evaluated:Patient Re-evaluated prior to inductionOxygen Delivery Method: Circle System Utilized Preoxygenation: Pre-oxygenation with 100% oxygen Intubation Type: IV induction Ventilation: Mask ventilation without difficulty Laryngoscope Size: Mac and 3 Grade View: Grade II Tube type: Oral Tube size: 7.0 mm Number of attempts: 1 Airway Equipment and Method: Stylet and Oral airway Placement Confirmation: ETT inserted through vocal cords under direct vision,  positive ETCO2 and breath sounds checked- equal and bilateral Secured at: 22 cm Tube secured with: Tape Dental Injury: Teeth and Oropharynx as per pre-operative assessment

## 2015-11-25 NOTE — Anesthesia Postprocedure Evaluation (Signed)
Anesthesia Post Note  Patient: Sandra James  Procedure(s) Performed: Procedure(s) (LRB): BILATERAL SKIN SPARING MASTECTOMIES WITH RIGHT SENTINEL LYMPH NODE BIOPSY (Bilateral) BILATERAL IMMEDIATE BREAST RECONSTRUCTION WITH PLACEMENT OF TISSUE EXPANDERS AND FLEX HD (ACELLULAR HYDRATED DERMIS) (Bilateral)  Patient location during evaluation: PACU Anesthesia Type: General and Regional Level of consciousness: awake and alert Pain management: pain level controlled Vital Signs Assessment: post-procedure vital signs reviewed and stable Respiratory status: spontaneous breathing Cardiovascular status: blood pressure returned to baseline Anesthetic complications: no    Last Vitals:  Filed Vitals:   11/25/15 1415 11/25/15 1442  BP: 132/96 150/98  Pulse: 100 100  Temp:  37.1 C  Resp: 26 20    Last Pain:  Filed Vitals:   11/25/15 1449  PainSc: 6                  Tiajuana Amass

## 2015-11-25 NOTE — Progress Notes (Signed)
Assisted Dr. Rodman Comp with right and  left, ultrasound guided, pectoralis blocks. Side rails up, monitors on throughout procedure. See vital signs in flow sheet. Tolerated Procedure well.

## 2015-11-25 NOTE — Interval H&P Note (Signed)
History and Physical Interval Note:  11/25/2015 7:20 AM  [name]  has presented today for surgery, with the diagnosis of RIGHT BREAST CANCER, HISTORY OF LEFT BREAST CANCER, DENSE BREASTS  The various methods of treatment have been discussed with the patient and family. After consideration of risks, benefits and other options for treatment, the patient has consented to  Procedure(s): BILATERAL SKIN SPARING MASTECTOMIES WITH RIGHT SENTINEL LYMPH NODE BIOPSY (Bilateral) BILATERAL IMMEDIATE BREAST RECONSTRUCTION WITH PLACEMENT OF TISSUE EXPANDER AND FLEX HD (ACELLULAR HYDRATED DERMIS) (Bilateral) as a surgical intervention .  The patient's history has been reviewed, patient examined, no change in status, stable for surgery.  I have reviewed the patient's chart and labs.  Questions were answered to the patient's satisfaction.     Sandra James

## 2015-11-25 NOTE — Anesthesia Preprocedure Evaluation (Addendum)
Anesthesia Evaluation  Patient identified by MRN, date of birth, ID band Patient awake    Reviewed: Allergy & Precautions, NPO status , Patient's Chart, lab work & pertinent test results  Airway Mallampati: II  TM Distance: >3 FB Neck ROM: Full    Dental  (+) Dental Advisory Given   Pulmonary neg pulmonary ROS,    breath sounds clear to auscultation       Cardiovascular negative cardio ROS   Rhythm:Regular Rate:Normal     Neuro/Psych Anxiety Depression negative neurological ROS     GI/Hepatic Neg liver ROS, GERD  ,  Endo/Other  diabetes, Type 2, Oral Hypoglycemic Agents  Renal/GU negative Renal ROS     Musculoskeletal   Abdominal   Peds  Hematology negative hematology ROS (+)   Anesthesia Other Findings   Reproductive/Obstetrics                               Component Value Date/Time   WBC 7.4 11/12/2015 0837   WBC 9.7 09/29/2014 1325   RBC 4.52 11/12/2015 0837   RBC 4.69 09/29/2014 1325   HGB 13.8 11/12/2015 0837   HGB 14.5 09/29/2014 1325   HCT 41.2 11/12/2015 0837   HCT 42.3 09/29/2014 1325   PLT 340 11/12/2015 0837   PLT 353 09/29/2014 1325      Component Value Date/Time   NA 141 11/12/2015 0839   NA 140 09/29/2014 1325   K 4.3 11/12/2015 0839   K 4.2 09/29/2014 1325   CL 100 09/29/2014 1325   CO2 25 11/12/2015 0839   CO2 27 09/29/2014 1325   BUN 7.7 11/12/2015 0839   BUN 7 09/29/2014 1325   CREATININE 0.8 11/12/2015 0839   CREATININE 0.56 09/29/2014 1325   CALCIUM 9.6 11/12/2015 0839   CALCIUM 9.7 09/29/2014 1325   ALKPHOS 79 11/12/2015 0839   ALKPHOS 100 09/29/2014 1325   AST 17 11/12/2015 0839   AST 18 09/29/2014 1325   ALT 17 11/12/2015 0839   ALT 16 09/29/2014 1325   BILITOT 0.89 11/12/2015 0839   BILITOT 0.8 09/29/2014 1325      Anesthesia Physical Anesthesia Plan  ASA: II  Anesthesia Plan: General and Regional   Post-op Pain Management: GA  combined w/ Regional for post-op pain   Induction: Intravenous  Airway Management Planned: Oral ETT  Additional Equipment:   Intra-op Plan:   Post-operative Plan: Extubation in OR  Informed Consent: I have reviewed the patients History and Physical, chart, labs and discussed the procedure including the risks, benefits and alternatives for the proposed anesthesia with the patient or authorized representative who has indicated his/her understanding and acceptance.   Dental advisory given  Plan Discussed with: CRNA  Anesthesia Plan Comments:         Anesthesia Quick Evaluation

## 2015-11-25 NOTE — H&P (Signed)
Sandra James is an 49 y.o. female.   Chief Complaint: right breast cancer HPI: The patient is a 49 yrs old bf here for a history and physical for breast reconstruction. She had genetic testing and it was negative. She has decided to move ahead with bilateral mastectomies. She is aware that the left side may not stretch or may have complications related to the previous radiation. The latissimus will be the back up plan. She is also aware that some may recommend the latissimus to start but she wants to wait.  History: She underwent a screening mammogram and was found to have suspicious right breast calcifications. The ultrasound showed 0.8 cm mass at 11-12 o'clock position. 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 She had BRCA testing in 2011 and was told it was negative. She had an oophorectomy in 2011. She is seeing Drs. Barry Dienes and [place]. She is 5 feet 6 inches, weighs 161 pounds. Preop bra= 36C. In 2004 she had left breast cancer and was treated with a lumpectomy and radiation. She has scarring laterally with distortion.    Past Medical History  Diagnosis Date  . Complication of anesthesia     slow to wake up- BP was low, fainted next day  . MVP (mitral valve prolapse)   . Infection     UTI  . Anxiety   . Fibroid   . Cancer [facility])     left brast lumpectomy   . Breast cancer of upper-outer quadrant of right female breast ([place]) 09/09/2015  . Diabetes mellitus without complication ([place])   . Depression   . GERD (gastroesophageal reflux disease)     Past Surgical History  Procedure Laterality Date  . Abdominal hysterectomy    . Breast surgery  2004    left lumpectomy  . Oophorectomy Bilateral     Family History  Problem Relation Age of Onset  . Hypertension Mother   . Stroke Mother   . Alzheimer's disease Mother   . Heart disease Father   . Stroke Father   . Heart attack Father   . Breast cancer Sister 79   double mastectomy  . Other Sister     "stomach tumor that wrapped around reproductive organs"; dx. 50s; required partial hysterectomy  . Alzheimer's disease Maternal Grandmother   . Alzheimer's disease Paternal Grandmother   . Other Other     had to have lymph nodes removed and radiation; dx. 75s  . Cancer Maternal Aunt     unspecified type; dx. <50  . Colon cancer Maternal Uncle     dx. 43s  . Breast cancer Cousin     dx. 70s   Social History:  reports that she has never smoked. She has never used smokeless tobacco. She reports that she drinks alcohol. She reports that she does not use illicit drugs.  Allergies:  Allergies  Allergen Reactions  . Lisinopril Cough  . Oxycodone Itching  . Percocet [Oxycodone-Acetaminophen] Itching    Medications Prior to Admission  Medication Sig Dispense Refill  . metFORMIN (GLUCOPHAGE) 500 MG tablet 500 mg 2 (two) times daily with a meal.   0  . omeprazole (PRILOSEC) 20 MG capsule Take 1 capsule (20 mg total) by mouth daily. (Patient taking differently: Take 20 mg by mouth as needed. As needed) 14 capsule 0  . HYDROcodone-acetaminophen (NORCO) 5-325 MG tablet Take 1 tablet by mouth.    . letrozole (FEMARA) 2.5 MG tablet Take 1 tablet (2.5  mg total) by mouth daily. 30 tablet 1    Results for orders placed or performed during the hospital encounter of 11/25/15 (from the past 48 hour(s))  Glucose, capillary     Status: None   Collection Time: 11/25/15  7:00 AM  Result Value Ref Range   Glucose-Capillary 85 65 - 99 mg/dL   No results found.  Review of Systems  Constitutional: Negative.   HENT: Negative.   Eyes: Negative.   Respiratory: Negative.   Cardiovascular: Negative.   Gastrointestinal: Negative.   Genitourinary: Negative.   Musculoskeletal: Negative.   Skin: Negative.   Psychiatric/Behavioral: Negative.     Blood pressure 132/86, pulse 98, temperature 98.5 F (36.9 C), temperature source Oral, height 5' 5.5" (1.664 m), weight  72.031 kg (158 lb 12.8 oz), SpO2 99 %. Physical Exam  Constitutional: She is oriented to person, place, and time. She appears well-developed and well-nourished.  HENT:  Head: Normocephalic and atraumatic.  Eyes: Conjunctivae and EOM are normal. Pupils are equal, round, and reactive to light.  Neck: Normal range of motion.  Cardiovascular: Normal rate.   Respiratory: Effort normal and breath sounds normal.  GI: Soft.  Musculoskeletal: Normal range of motion.  Neurological: She is alert and oriented to person, place, and time.  Skin: Skin is warm.  Psychiatric: She has a normal mood and affect. Her behavior is normal. Judgment and thought content normal.     Assessment/Plan Plan for bilateral immediate breast reconstruction with expander and flex HD placement.  Wallace Going 11/25/2015, 7:05 AM

## 2015-11-25 NOTE — Op Note (Signed)
Bilateral Skin Sparing Mastectomy with RightSentinel Node Biopsy Procedure Note  Indications: This patient presents with history of right breast cancer with clinically negative axillary lymph node exam, previous left breast cancer.  Pre-operative Diagnosis: right breast cancer, upper outer quadrant, [id number]; previous left breast cancer  Post-operative Diagnosis: right breast cancer, same  Surgeon: Stark Klein   Anesthesia: General endotracheal anesthesia and Local anesthesia 0.25.% bupivacaine, with epinephrine  ASA Class: 2  Procedure Details  The patient was seen in the Holding Room. The risks, benefits, complications, treatment options, and expected outcomes were discussed with the patient. The possibilities of reaction to medication, pulmonary aspiration, bleeding, infection, the need for additional procedures, failure to diagnose a condition, and creating a complication requiring transfusion or operation were discussed with the patient. The patient concurred with the proposed plan, giving informed consent.  The site of surgery properly noted/marked. The patient was taken to Operating Room # 6, identified as Sandra James and the procedure verified as Bilateral skin sparing Mastectomy and right sentinel Node Biopsy. A Time Out was held and the above information confirmed.      After induction of anesthesia, the bilateral breast and chest were prepped and draped in standard fashion.   The borders of the breast were identified and marked.  The right side was addressed first.  A teardrop incision was made with the #10 blade with the extension laterally oriented.   Mastectomy hooks were used to provide elevation of the skin edges, and the cautery was used to create the mastectomy flaps.  The dissection was taken to the fascia of the pectoralis major.  The penetrating vessels were clipped.  The superior flap was taken medially to the lateral sternal border, superiorly to the inferior border of  the clavicle.  The inferior flap was similarly created, inferiorly to the inframammary fold and laterally to the border of the latissimus.  The breast was taken off including the pectoralis fascia and the axillary tail marked.    Using a hand-held gamma probe, axillary sentinel nodes were identified.  Three level 2 axillary sentinel nodes were removed and submitted to pathology.  The findings are below.  The lymphovascular channels were clipped with metal clips.        The wound was irrigated.   Hemostasis was achieved with cautery.  The wound was packed with a moist sponge and a towel placed over it while doing the other side.    The left side was then addressed similarly.  Because of previous radiation, the teardrop shape was much smaller on the left side.  A good portion of the previous incision was used.  Hemostasis was achieved and the wound was irrigated.      The patient was left asleep in the OR with Dr. Marla Roe for reconstruction.    Findings: grossly clear surgical margins  Estimated Blood Loss:  <50 mL         Drains: per Dr. Marla Roe                Specimens: right breast, 3 axillary sentinel nodes.  One was in the tail of the breast and was cps 50.  #2 palpable, cps 15, #3 cps 310.  Background count 5.           Complications:  None; patient tolerated the procedure well.         Disposition: PACU - hemodynamically stable.         Condition: stable

## 2015-11-25 NOTE — Op Note (Signed)
Op report    DATE OF OPERATION:  11/25/2015  LOCATION: [place]  SURGICAL DIVISION: Plastic Surgery  PREOPERATIVE DIAGNOSES:  1. Right Breast cancer.    POSTOPERATIVE DIAGNOSES:  1. Right Breast cancer.   PROCEDURE:  1. Bilateral immediate breast reconstruction with placement of Acellular Dermal Matrix and tissue expanders.  SURGEON: Theodoro Kos, DO  ASSISTANT: Shawn Rayburn, PA  ANESTHESIA:  General.   COMPLICATIONS: None.   IMPLANTS: Left - Mentor 350 cc. Ref [id number].  Serial Number [id number] (100 of injectable saline placed) Right - Mentor 350cc. Ref [id number].  Serial Number [id number]  (100 of injectable saline placed) Acellular Dermal Matrix 4 x 16 two  INDICATIONS FOR PROCEDURE:  The patient, Sandra James, is a 49 y.o. female born on 1966-05-20, is here for immediate first stage breast reconstruction with placement of bilateral tissue expander and Acellular dermal matrix for treatment of right breast cancer.  She has a history of left breast cancer. MRN: [id number]  CONSENT:  Informed consent was obtained directly from the patient. Risks, benefits and alternatives were fully discussed. Specific risks including but not limited to bleeding, infection, hematoma, seroma, scarring, pain, implant infection, implant extrusion, capsular contracture, asymmetry, wound healing problems, and need for further surgery were all discussed. The patient did have an ample opportunity to have her questions answered to her satisfaction.   DESCRIPTION OF PROCEDURE:  The patient was taken to the operating room by the general surgery team. SCDs were placed and IV antibiotics were given. The patient's chest was prepped and draped in a sterile fashion. A time out was performed and the implants to be used were identified.  Bilateral mastectomies were performed.  Once the general surgery team had completed their portion of the case the patient was rendered to the  plastic and reconstructive surgery team.  Left: The pectoralis major muscle was lifted from the chest wall with release of the lateral edge and lateral inframammary fold.  The pocket was irrigated with antibiotic solution and hemostasis was achieved with electrocautery.  The ADM was then prepared according to the manufacture guidelines and slits placed to help with postoperative fluid management.  The ADM was then sutured to the inferior and lateral edge of the inframammary fold with 2-0 PDS starting with an interrupted stitch and then a running stitch.  The lateral portion was sutured to with interrupted sutures after the expander was placed.  The expander was prepared according to the manufacture guidelines, the air evacuated and then it was placed under the ADM and pectoralis major muscle.  The inferior and lateral tabs were used to secure the expander to the chest wall with 2-0 PDS.  The drain was placed at the inframammary fold over the ADM and secured to the skin with 3-0 Silk.    Right: The pectoralis major muscle was lifted from the chest wall with release of the lateral edge and lateral inframammary fold.  The pocket was irrigated with antibiotic solution and hemostasis was achieved with electrocautery.  The ADM was then prepared according to the manufacture guidelines and slits placed to help with postoperative fluid management.  The ADM was then sutured to the inferior and lateral edge of the inframammary fold with 2-0 PDS starting with an interrupted stitch and then a running stitch.  The lateral portion was sutured to with interrupted sutures after the expander was placed.  The expander was prepared according to the manufacture guidelines, the air evacuated and then it was placed under  the ADM and pectoralis major muscle.  The inferior and lateral tabs were used to secure the expander to the chest wall with 2-0 PDS.  The drain was placed at the inframammary fold over the ADM and secured to the skin  with 3-0 Silk.   The deep layers were closed with 3-0 Vicryl followed by 4-0 Monocryl.  The skin was closed with 5-0 Monocryl and then dermabond was applied.  The ABDs and breast binder were placed.  The patient tolerated the procedure well and there were no complications.  The patient was allowed to wake from anesthesia and taken to the recovery room in satisfactory condition.

## 2015-11-25 NOTE — Brief Op Note (Signed)
11/25/2015  11:55 AM  PATIENT:  Sandra James  49 y.o. female  PRE-OPERATIVE DIAGNOSIS:  RIGHT BREAST CANCER, HISTORY OF LEFT BREAST CANCER, DENSE BREASTS  POST-OPERATIVE DIAGNOSIS:  RIGHT BREAST CANCER, HISTORY OF LEFT BREAST CANCER, DENSE BREASTS  PROCEDURE:  Procedure(s): BILATERAL SKIN SPARING MASTECTOMIES WITH RIGHT SENTINEL LYMPH NODE BIOPSY (Bilateral) BILATERAL IMMEDIATE BREAST RECONSTRUCTION WITH PLACEMENT OF TISSUE EXPANDERS AND FLEX HD (ACELLULAR HYDRATED DERMIS) (Bilateral)  SURGEON:  Surgeon(s) and Role: Panel 1:    * Stark Klein, MD - Primary  Panel 2:    * Loel Lofty Mizraim Harmening, DO - Primary  PHYSICIAN ASSISTANT: Shawn Rayburn,PA  ASSISTANTS: none   ANESTHESIA:   general  EBL:  Total I/O In: 2000 [I.V.:2000] Out: 400 [Urine:400]  BLOOD ADMINISTERED:none  DRAINS: (1) Jackson-Pratt drain(s) with closed bulb suction in the breast pocket on each side.   LOCAL MEDICATIONS USED:  NONE  SPECIMEN:  No Specimen  DISPOSITION OF SPECIMEN:  N/A  COUNTS:  YES  TOURNIQUET:  * No tourniquets in log *  DICTATION: .Dragon Dictation  PLAN OF CARE: Admit for overnight observation  PATIENT DISPOSITION:  PACU - hemodynamically stable.   Delay start of Pharmacological VTE agent (>24hrs) due to surgical blood loss or risk of bleeding: no

## 2015-11-25 NOTE — Discharge Instructions (Addendum)
Drain Care May shower on Sunday Continue binder or sports bra   Call your surgeon if you experience:   1.  Fever over 101.0. 2.  Inability to urinate. 3.  Nausea and/or vomiting. 4.  Extreme swelling or bruising at the surgical site. 5.  Continued bleeding from the incision. 6.  Increased pain, redness or drainage from the incision. 7.  Problems related to your pain medication. 8. Any change in color, movement and/or sensation 9. Any problems and/or concerns   About my Jackson-Pratt Bulb Drain  What is a Jackson-Pratt bulb? A Jackson-Pratt is a soft, round device used to collect drainage. It is connected to a long, thin drainage catheter, which is held in place by one or two small stiches near your surgical incision site. When the bulb is squeezed, it forms a vacuum, forcing the drainage to empty into the bulb.  Emptying the Jackson-Pratt bulb- To empty the bulb: 1. Release the plug on the top of the bulb. 2. Pour the bulb's contents into a measuring container which your nurse will provide. 3. Record the time emptied and amount of drainage. Empty the drain(s) as often as your     doctor or nurse recommends.  Date                  Time                    Amount (Drain 1)                 Amount (Drain 2)  _____________________________________________________________________  _____________________________________________________________________  _____________________________________________________________________  _____________________________________________________________________  _____________________________________________________________________  _____________________________________________________________________  _____________________________________________________________________  _____________________________________________________________________  Squeezing the Jackson-Pratt Bulb- To squeeze the bulb: 1. Make sure the plug at the top of the bulb is  open. 2. Squeeze the bulb tightly in your fist. You will hear air squeezing from the bulb. 3. Replace the plug while the bulb is squeezed. 4. Use a safety pin to attach the bulb to your clothing. This will keep the catheter from     pulling at the bulb insertion site.  When to call your doctor- Call your doctor if:  Drain site becomes red, swollen or hot.  You have a fever greater than 101 degrees F.  There is oozing at the drain site.  Drain falls out (apply a guaze bandage over the drain hole and secure it with tape).  Drainage increases daily not related to activity patterns. (You will usually have more drainage when you are active than when you are resting.)  Drainage has a bad odor.

## 2015-11-26 ENCOUNTER — Encounter (HOSPITAL_BASED_OUTPATIENT_CLINIC_OR_DEPARTMENT_OTHER): Payer: Self-pay | Admitting: General Surgery

## 2015-11-26 DIAGNOSIS — C50411 Malignant neoplasm of upper-outer quadrant of right female breast: Secondary | ICD-10-CM | POA: Diagnosis not present

## 2015-11-26 MED ORDER — POLYETHYLENE GLYCOL 3350 17 G PO PACK: 17.0000 g | PACK | Freq: Every day | ORAL | Status: DC | PRN

## 2015-11-26 MED ORDER — HYDROCODONE-ACETAMINOPHEN 5-325 MG PO TABS: 1.0000 | ORAL_TABLET | ORAL | Status: DC | PRN

## 2015-11-26 MED ORDER — DIPHENHYDRAMINE HCL 25 MG PO CAPS: 25.0000 mg | ORAL_CAPSULE | Freq: Four times a day (QID) | ORAL | Status: DC | PRN

## 2015-11-26 MED ORDER — DIAZEPAM 2 MG PO TABS: 2.0000 mg | ORAL_TABLET | Freq: Two times a day (BID) | ORAL | Status: DC | PRN

## 2015-12-02 ENCOUNTER — Encounter: Payer: BLUE CROSS/BLUE SHIELD | Admitting: Hematology

## 2015-12-02 ENCOUNTER — Encounter: Payer: Self-pay | Admitting: Hematology

## 2015-12-02 NOTE — Progress Notes (Signed)
This encounter was created in error - please disregard.

## 2015-12-03 ENCOUNTER — Telehealth: Payer: Self-pay | Admitting: Hematology

## 2015-12-03 NOTE — Telephone Encounter (Signed)
per pof to sch pt appt-cld & spoke to pt and gave pt time & date for appt °

## 2015-12-08 ENCOUNTER — Encounter (HOSPITAL_COMMUNITY): Payer: Self-pay

## 2015-12-09 ENCOUNTER — Other Ambulatory Visit: Payer: Self-pay | Admitting: General Surgery

## 2015-12-14 ENCOUNTER — Ambulatory Visit: Payer: BLUE CROSS/BLUE SHIELD | Admitting: Hematology

## 2015-12-15 ENCOUNTER — Other Ambulatory Visit: Payer: Self-pay | Admitting: Hematology

## 2015-12-15 ENCOUNTER — Ambulatory Visit (HOSPITAL_BASED_OUTPATIENT_CLINIC_OR_DEPARTMENT_OTHER): Payer: BLUE CROSS/BLUE SHIELD | Admitting: Hematology

## 2015-12-15 ENCOUNTER — Telehealth: Payer: Self-pay | Admitting: Hematology

## 2015-12-15 VITALS — BP 112/66 | HR 81 | Temp 98.7°F | Resp 18 | Wt 150.3 lb

## 2015-12-15 DIAGNOSIS — C50411 Malignant neoplasm of upper-outer quadrant of right female breast: Secondary | ICD-10-CM | POA: Diagnosis not present

## 2015-12-15 DIAGNOSIS — E119 Type 2 diabetes mellitus without complications: Secondary | ICD-10-CM

## 2015-12-15 MED ORDER — PROCHLORPERAZINE MALEATE 10 MG PO TABS: 10.0000 mg | ORAL_TABLET | Freq: Four times a day (QID) | ORAL | Status: DC | PRN

## 2015-12-15 MED ORDER — LIDOCAINE-PRILOCAINE 2.5-2.5 % EX CREA: TOPICAL_CREAM | CUTANEOUS | Status: DC

## 2015-12-15 MED ORDER — DEXAMETHASONE 4 MG PO TABS: 8.0000 mg | ORAL_TABLET | Freq: Two times a day (BID) | ORAL | Status: DC

## 2015-12-15 MED ORDER — ONDANSETRON HCL 8 MG PO TABS: 8.0000 mg | ORAL_TABLET | Freq: Two times a day (BID) | ORAL | Status: DC

## 2015-12-15 MED ORDER — MIRTAZAPINE 15 MG PO TABS: 15.0000 mg | ORAL_TABLET | Freq: Every day | ORAL | Status: DC

## 2015-12-15 NOTE — Telephone Encounter (Signed)
Called and left a message for an appointment today and to call and confirm   Sandra James

## 2015-12-15 NOTE — Progress Notes (Signed)
[place]  [phone] [phone]  Clinic follow Up Note   Patient Care Team: Kristie Cowman, MD as PCP - General (Family Medicine) Stark Klein, MD as Consulting Physician (General Surgery) Truitt Merle, MD as Consulting Physician (Hematology) Arloa Koh, MD as Consulting Physician (Radiation Oncology) Mauro Kaufmann, RN as Registered Nurse Rockwell Germany, RN as Registered Nurse Sylvan Cheese, NP as Nurse Practitioner (Nurse Practitioner) 12/15/2015  CHIEF COMPLAINTS:  Follow up right breast cancer  Oncology History   Breast cancer of upper-outer quadrant of right female breast [facility])   Staging form: Breast, AJCC 7th Edition     Clinical stage from 09/15/2015: Stage IA (T1b, N0, M0) - Unsigned       Breast cancer of upper-outer quadrant of right female breast ([place])   09/07/2015 Mammogram Diagnostic mammogram and the ultrasound of the right breast showed a 0.8 cm lobulated mass in the right breast 11 to 12:00 position 9 cm from the nipple. No other lesions or adenopathy.   09/08/2015 Initial Diagnosis Breast cancer of upper-outer quadrant of right female breast ([place])   09/08/2015 Initial Biopsy Right breast mass at the upper outer quadrant core needle biopsy showed invasive ductal carcinoma, grade 2-3, ductal carcinoma in situ.   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 Left breast ostectomy was negative for malignancy.    HISTORY OF PRESENTING ILLNESS:  Sandra James 50 y.o. female with past medical history of stage I left breast cancer, is here because of newly diagnosed right breast cancer. She presents  to our multidisciplinary breast clinic by herself.  The right breast cancer was discovered by screening mammogram. She did not have any palpable mass, or any constitutional symptoms. She was diagnosed with stage I left breast cancer at age of 51, she had lumpectomy, radiation, and adjuvant chemotherapy and 5 years of tamoxifen. She was treated by Dr. Louann Sjogren in [place]. She moved to [facility] about year ago due to job change. She has been very compliant with annual screening mammogram.  She had hysterectomy and bilateral oophorectomy and sphincterectomy 5 years ago for heavy bleeding.. She has been having hot flashes since then, moderate, but manageable. She is single, lives alone, works for 2 to [place] has to go up children who live in [place].  CURRENT THERAPY: Pending adjuvant chemotherapy   INTERIM HISTORY [name] returns for follow-up. She underwent bilateral mastectomy and tissue expander placement on 11/25/2015. She tolerated the procedure well, has a draining tube removed yesterday. She still has mild pain at the incision sites, some limitation of the right shoulder mobility. She otherwise has recovered well.   MEDICAL HISTORY:  Past Medical History  Diagnosis Date  . Complication of anesthesia     slow to wake up- BP was low, fainted next day  . MVP (mitral valve prolapse)   . Infection     UTI  . Anxiety   . Fibroid   . Cancer [facility])     left brast lumpectomy   . Breast cancer of upper-outer quadrant of right female breast ([place]) 09/09/2015  . Diabetes mellitus without complication ([place])   . Depression   .  GERD (gastroesophageal reflux disease)     SURGICAL HISTORY: Past Surgical History  Procedure Laterality Date  . Abdominal hysterectomy    . Breast surgery  2004    left lumpectomy  . Oophorectomy Bilateral   . Mastectomy w/ sentinel node biopsy Bilateral 11/25/2015    Procedure: BILATERAL SKIN SPARING MASTECTOMIES WITH RIGHT SENTINEL LYMPH NODE BIOPSY;   Surgeon: Stark Klein, MD;  Location: [place];  Service: General;  Laterality: Bilateral;  . Breast reconstruction with placement of tissue expander and flex hd (acellular hydrated dermis) Bilateral 11/25/2015    Procedure: BILATERAL IMMEDIATE BREAST RECONSTRUCTION WITH PLACEMENT OF TISSUE EXPANDERS AND FLEX HD (ACELLULAR HYDRATED DERMIS);  Surgeon: Wallace Going, DO;  Location: [place];  Service: Plastics;  Laterality: Bilateral;    SOCIAL HISTORY: Social History   Social History  . Marital Status: Single     Spouse Name: N/A  . Number of Children: 2 children, 34 daughter and 33 yo son    . Years of Education: N/A   Occupational History  . She is a Barista rep   Social History Main Topics  . Smoking status: Never Smoker   . Smokeless tobacco: Never Used  . Alcohol Use: Yes     Comment: 2-3/wk  . Drug Use: No  . Sexual Activity: Yes    Birth Control/ Protection: Surgical   Other Topics Concern  . Not on file   Social History Narrative    FAMILY HISTORY: Family History  Problem Relation Age of Onset  . Hypertension Mother   . Stroke Mother   . Alzheimer's disease Mother   . Heart disease Father   . Stroke Father   . Heart attack Father   . Breast cancer Sister 40    double mastectomy  . Other Sister     "stomach tumor that wrapped around reproductive organs"; dx. 62s; required partial hysterectomy  . Alzheimer's disease Maternal Grandmother   . Alzheimer's disease Paternal Grandmother   . Other Other     had to have lymph nodes removed and radiation; dx. 20s  . Cancer Maternal Aunt     unspecified type; dx. <50  . Colon cancer Maternal Uncle     dx. 43s  . Breast cancer Cousin     dx. 96s    ALLERGIES:  is allergic to lisinopril; oxycodone; and percocet.  MEDICATIONS:  Current Outpatient Prescriptions  Medication Sig Dispense Refill  . metFORMIN (GLUCOPHAGE) 500 MG tablet 500 mg 2 (two) times daily with a meal.    0  . omeprazole (PRILOSEC) 20 MG capsule Take 1 capsule (20 mg total) by mouth daily. (Patient taking differently: Take 20 mg by mouth as needed. As needed) 14 capsule 0  . polyethylene glycol (MIRALAX / GLYCOLAX) packet Take 17 g by mouth daily as needed for mild constipation. 14 each 0   No current facility-administered medications for this visit.    REVIEW OF SYSTEMS:   Constitutional: Denies fevers, chills or abnormal night sweats Eyes: Denies blurriness of vision, double vision or watery eyes Ears, nose, mouth, throat, and face: Denies mucositis or sore throat Respiratory: Denies cough, dyspnea or wheezes Cardiovascular: Denies palpitation, chest discomfort or lower extremity swelling Gastrointestinal:  Denies nausea, heartburn or change in bowel habits Skin: Denies abnormal skin rashes Lymphatics: Denies new lymphadenopathy or easy bruising Neurological:Denies numbness, tingling or new weaknesses Behavioral/Psych: Mood is stable, no new changes  All other systems were reviewed with the patient and are negative.  PHYSICAL EXAMINATION: ECOG PERFORMANCE STATUS: 1  Filed Vitals:   12/15/15 1515  BP: 112/66  Pulse: 81  Temp: 98.7 F (37.1 C)  Resp: 18   Filed Weights   12/15/15 1515  Weight: 150 lb 4.8 oz (68.176 kg)    GENERAL:alert, no distress and comfortable SKIN: skin color, texture, turgor are normal, no rashes or significant lesions EYES: normal, conjunctiva are pink and non-injected, sclera clear OROPHARYNX:no exudate, no erythema and lips, buccal mucosa, and tongue normal  NECK: supple, thyroid normal size, non-tender, without nodularity LYMPH:  no palpable lymphadenopathy in the cervical, axillary or inguinal LUNGS: clear to auscultation and percussion with normal breathing effort HEART: regular rate & rhythm and no murmurs and no lower extremity edema ABDOMEN:abdomen soft, non-tender and normal bowel sounds Musculoskeletal:no cyanosis of digits and no clubbing   PSYCH: alert & oriented x 3 with fluent speech NEURO: no focal motor/sensory deficits Breasts: s/p bilateral mastectomy. Surgical incision sites are clean, no surrounding skin erythema or discharge. There is a mild discharge at the J-tube site, no surrounding skin erythema.   LABORATORY DATA:  I have reviewed the data as listed CBC Latest Ref Rng 11/12/2015 09/15/2015 09/29/2014  WBC 3.9 - 10.3 10e3/uL 7.4 7.5 9.7  Hemoglobin 11.6 - 15.9 g/dL 13.8 13.9 14.5  Hematocrit 34.8 - 46.6 % 41.2 42.0 42.3  Platelets 145 - 400 10e3/uL 340 368 353    CMP Latest Ref Rng 11/12/2015 09/15/2015 09/29/2014  Glucose 70 - 140 mg/dl 104 122 99  BUN 7.0 - 26.0 mg/dL 7.7 7.8 7  Creatinine 0.6 - 1.1 mg/dL 0.8 0.8 0.56  Sodium 136 - 145 mEq/L 141 141 140  Potassium 3.5 - 5.1 mEq/L 4.3 3.6 4.2  Chloride 96 - 112 mEq/L - - 100  CO2 22 - 29 mEq/L 25 26 27   Calcium 8.4 - 10.4 mg/dL 9.6 9.4 9.7  Total Protein 6.4 - 8.3 g/dL 7.8 7.6 8.5(H)  Total Bilirubin 0.20 - 1.20 mg/dL 0.89 1.19 0.8  Alkaline Phos 40 - 150 U/L 79 80 100  AST 5 - 34 U/L 17 18 18   ALT 0 - 55 U/L 17 22 16      Pathology report Diagnosis 11/25/2015 1. Breast, simple mastectomy, right - INVASIVE DUCTAL CARCINOMA, GRADE 2/3, SPANNING 2.0 CM. - DUCTAL CARCINOMA IN SITU, INTERMEDIATE GRADE. - LYMPHOVASCULAR INVASION IS IDENTIFIED. - LOBULAR NEOPLASIA (ATYPICAL LOBULAR HYPERPLASIA). - THE SURGICAL RESECTION MARGINS ARE NEGATIVE FOR CARCINOMA. - SEE ONCOLOGY TABLE BELOW. 2. Lymph node, sentinel, biopsy, right axillary #1 - METASTATIC CARCINOMA IN 1 OF 1 LYMPH NODE (1/1). 3. Lymph node, sentinel, biopsy, right axillary #2 - THERE IS NO EVIDENCE OF CARCINOMA IN 1 OF 1 LYMPH NODE (0/1). 4. Lymph node, sentinel, biopsy, right axillary #3 - THERE IS NO EVIDENCE OF CARCINOMA IN 1 OF 1 LYMPH NODE (0/1). 5. Breast, simple mastectomy, left - BENIGN BREAST PARENCHYMA WITH DENSE STROMAL FIBROSIS. - FIBROADENOMA. - THERE IS NO EVIDENCE OF  MALIGNANCY. 6. Breast, excision, left, additional lateral margin - BENIGN FIBROADIPOSE TISSUE. - THERE IS NO EVIDENCE OF MALIGNANCY. - SEE COMMENT. Microscopic Comment 1. BREAST, INVASIVE TUMOR, WITH LYMPH NODES PRESENT Specimen, including laterality and lymph node sampling (sentinel, non-sentinel): Right breast and right axillary lymph nodes Procedure: Bilateral mastectomy and multiple right axillary lymph node resections Histologic type: Ductal Grade: 2 Tubule formation: 2 1 of 4 FINAL for Stipp, [place] ([id number]) Microscopic Comment(continued) Nuclear pleomorphism: 2 Mitotic: 2 Tumor size (gross measurement): 2.0 cm Margins: Negative for carcinoma Invasive,  distance to closest margin: 1.8 cm to the posterior margin In-situ, distance to closest margin: 1.8 cm to the posterior margin Lymphovascular invasion: Present Ductal carcinoma in situ: Present Grade: Intermediate grade Extensive intraductal component: Not identified Lobular neoplasia: Present, atypical lobular hyperplasia Tumor focality: Unifocal Treatment effect: Not identified Extent of tumor: Confined to breast parenchyma Lymph nodes: Examined: 3 Sentinel 0 Non-sentinel 3 Total Lymph nodes with metastasis: 1 (macrometastasis) - no extracapsular extension is identified. Breast prognostic profile: [phone] Estrogen receptor: 100%, strong staining intensity Progesterone receptor: 100%, strong staining intensity Her 2 neu: Amplification was detected. The ratio was 2.58 Ki-67: 60% TNM: pT1c, pN1a Non-neoplastic breast: No significant findings. 6. The surgical resection margin(s) of the specimen were inked and microscopically evaluated. Enid Cutter MD Pathologist, Electronic Signature (Case signed 11/29/2015)   RADIOGRAPHIC STUDIES: I have personally reviewed the radiological images as listed and agreed with the findings in the report.  Ultrasound and diagnostic Mammogram 09/07/2015 Impression There  is a 0.8 cm lobulated mass with an in distinct margin in the right breast at 11 to 12:00 9 cm from the nipple. This is a mild great concern for malignancy and the biopsy is recommended.  ASSESSMENT & PLAN:  50 year old African-American female, surgical postmenopausal, history of stage I left breast cancer, with newly diagnosed right stage I breast cancer.  1. Right breast invasive ductal carcinoma, pT1cN1aM0, stage IIB, grade 2, ER+/PR+/HER2+ -I reviewed her surgical pathology results with patient in great details. She has 1 out of 3 sentinel lymph nodes positive, her pathological stage is more advanced than her initial clinical stage.  -We reviewed the natural history of triple positive breast cancer. HER-2 positive tumors are more progressive, with higher risk of recurrence -I recommend her to have a CT chest abdomen and pelvis and a bone scan to ruled out distant metastasis, giving her HER-2 positive stage IIB disease. -Given her high risk of cancer recurrence, I recommend adjuvant chemotherapy with docetaxel, carboplatin, Herceptin and Perjeta (TCHP), every 3 weeks for 6 cycles,  followed by maintenance Herceptin to complete a 1 year therapy. She previously received Adriamycin, will not be able to tolerate more adrimycin. --Chemotherapy consent: Side effects including but does not not limited to, fatigue, nausea, vomiting, diarrhea, hair loss, neuropathy, fluid retention, renal and kidney dysfunction, neutropenic fever, needed for blood transfusion, bleeding, infusion reaction, skin rash, heart failure, were discussed with patient in great detail. She agrees to proceed. -The goal of adjuvant chemotherapy is curative. -Due to her previous exposure to Adriamycin, I'll refer her to cardiologist Dr. Haroldine Laws to monitor her heart when she is on Herceptin therapy. -Given her ER/PR positive disease, and post menopausal status, I recommend her to take aromatase inhibitor for 5-10 years. However she could  not tolerate letrozole which was tried before surgery. She previously tolerated tamoxifen well, I recommend her start tamoxifen after she completes radiation. -Physical therapy referral   2. Genetics -Due to her young age and recurrent breast cancer, she was referred to see a genetic counselor in our cancer center. -Her genetic test was negative  3. DM -she will continue follow-up with her primary care physician -She is on metformin  4. Bone health -She is postmenopausal by surgery -I encouraged her to take calcium and vitamin D -I'll obtain a baseline bone density scan   Plan -Port placement by Dr. Barry Dienes next week -Chemotherapy class -Start adjuvant [facility] with [facility] on 1/26  -cardiology referral and PT referral   All questions were answered. The patient  knows to call the clinic with any problems, questions or concerns. I spent 30 minutes counseling the patient face to face. The total time spent in the appointment was 40 minutes and more than 50% was on counseling.     Truitt Merle, MD 12/15/2015 4:00 PM

## 2015-12-15 NOTE — Telephone Encounter (Signed)
per pof to sch pt appt-sent MW email to sch trmt-gave pt contrast-adv [facility] willc all to sch trmt

## 2015-12-15 NOTE — Telephone Encounter (Signed)
per pof to sch pt appt-sent MW email to sch trmt-pt to get updqated copy of sch @ chemo class 1/12-adv [facility] will call to sch scans-gave contrast-referral in workque @ both offices

## 2015-12-16 ENCOUNTER — Telehealth: Payer: Self-pay | Admitting: *Deleted

## 2015-12-16 ENCOUNTER — Encounter: Payer: Self-pay | Admitting: Hematology

## 2015-12-16 NOTE — Telephone Encounter (Signed)
Per staff message and POF I have scheduled appts. Advised scheduler of appts and that appt on 1/26 is to late to start treatment. JMW

## 2015-12-17 ENCOUNTER — Encounter (HOSPITAL_COMMUNITY): Payer: Self-pay | Admitting: *Deleted

## 2015-12-17 ENCOUNTER — Telehealth: Payer: Self-pay | Admitting: *Deleted

## 2015-12-17 ENCOUNTER — Other Ambulatory Visit: Payer: Self-pay | Admitting: *Deleted

## 2015-12-17 DIAGNOSIS — C50411 Malignant neoplasm of upper-outer quadrant of right female breast: Secondary | ICD-10-CM

## 2015-12-17 NOTE — Telephone Encounter (Signed)
Per staff message and POF I have scheduled appts. Advised scheduler of appts. JMW  

## 2015-12-19 MED ORDER — CEFAZOLIN SODIUM-DEXTROSE 2-3 GM-% IV SOLR
2.0000 g | INTRAVENOUS | Status: AC
  Administered 2015-12-20: 2 g via INTRAVENOUS
  Filled 2015-12-19: qty 50

## 2015-12-20 ENCOUNTER — Encounter (HOSPITAL_COMMUNITY)
Admission: RE | Disposition: A | Discharge status: Home / Self Care | Payer: Self-pay | Source: Ambulatory Visit | Attending: General Surgery

## 2015-12-20 ENCOUNTER — Ambulatory Visit (HOSPITAL_COMMUNITY)
Admission: RE | Admit: 2015-12-20 | Discharge: 2015-12-20 | Disposition: A | Discharge status: Home / Self Care | Payer: BLUE CROSS/BLUE SHIELD | Source: Ambulatory Visit | Attending: General Surgery | Admitting: General Surgery

## 2015-12-20 ENCOUNTER — Ambulatory Visit (HOSPITAL_COMMUNITY): Payer: BLUE CROSS/BLUE SHIELD | Admitting: Certified Registered Nurse Anesthetist

## 2015-12-20 ENCOUNTER — Encounter (HOSPITAL_COMMUNITY): Payer: Self-pay

## 2015-12-20 ENCOUNTER — Encounter: Payer: Self-pay | Admitting: Radiation Oncology

## 2015-12-20 ENCOUNTER — Telehealth: Payer: Self-pay | Admitting: Hematology

## 2015-12-20 ENCOUNTER — Ambulatory Visit: Payer: BLUE CROSS/BLUE SHIELD

## 2015-12-20 ENCOUNTER — Ambulatory Visit
Admission: RE | Admit: 2015-12-20 | Discharge: 2015-12-20 | Disposition: A | Discharge status: Home / Self Care | Payer: BLUE CROSS/BLUE SHIELD | Source: Ambulatory Visit | Attending: Radiation Oncology | Admitting: Radiation Oncology

## 2015-12-20 ENCOUNTER — Telehealth: Payer: Self-pay | Admitting: *Deleted

## 2015-12-20 ENCOUNTER — Ambulatory Visit (HOSPITAL_COMMUNITY): Payer: BLUE CROSS/BLUE SHIELD

## 2015-12-20 DIAGNOSIS — Z7984 Long term (current) use of oral hypoglycemic drugs: Secondary | ICD-10-CM | POA: Insufficient documentation

## 2015-12-20 DIAGNOSIS — Z419 Encounter for procedure for purposes other than remedying health state, unspecified: Secondary | ICD-10-CM

## 2015-12-20 DIAGNOSIS — F419 Anxiety disorder, unspecified: Secondary | ICD-10-CM | POA: Diagnosis not present

## 2015-12-20 DIAGNOSIS — K219 Gastro-esophageal reflux disease without esophagitis: Secondary | ICD-10-CM | POA: Insufficient documentation

## 2015-12-20 DIAGNOSIS — Z9889 Other specified postprocedural states: Secondary | ICD-10-CM | POA: Insufficient documentation

## 2015-12-20 DIAGNOSIS — Z853 Personal history of malignant neoplasm of breast: Secondary | ICD-10-CM | POA: Insufficient documentation

## 2015-12-20 DIAGNOSIS — Z170 Estrogen receptor positive status [ER+]: Secondary | ICD-10-CM | POA: Insufficient documentation

## 2015-12-20 DIAGNOSIS — C50411 Malignant neoplasm of upper-outer quadrant of right female breast: Secondary | ICD-10-CM | POA: Insufficient documentation

## 2015-12-20 DIAGNOSIS — E119 Type 2 diabetes mellitus without complications: Secondary | ICD-10-CM | POA: Insufficient documentation

## 2015-12-20 DIAGNOSIS — C50911 Malignant neoplasm of unspecified site of right female breast: Secondary | ICD-10-CM | POA: Diagnosis present

## 2015-12-20 DIAGNOSIS — Z923 Personal history of irradiation: Secondary | ICD-10-CM | POA: Insufficient documentation

## 2015-12-20 DIAGNOSIS — Z79899 Other long term (current) drug therapy: Secondary | ICD-10-CM | POA: Insufficient documentation

## 2015-12-20 DIAGNOSIS — Z9013 Acquired absence of bilateral breasts and nipples: Secondary | ICD-10-CM | POA: Insufficient documentation

## 2015-12-20 DIAGNOSIS — I341 Nonrheumatic mitral (valve) prolapse: Secondary | ICD-10-CM | POA: Insufficient documentation

## 2015-12-20 HISTORY — DX: Malignant neoplasm of unspecified site of unspecified female breast: C50.919

## 2015-12-20 HISTORY — DX: Urinary tract infection, site not specified: N39.0

## 2015-12-20 HISTORY — DX: Allergy, unspecified, initial encounter: T78.40XA

## 2015-12-20 HISTORY — PX: PORTACATH PLACEMENT: SHX2246

## 2015-12-20 LAB — BASIC METABOLIC PANEL
Anion gap: 10 (ref 5–15)
CALCIUM: 9.1 mg/dL (ref 8.9–10.3)
CHLORIDE: 105 mmol/L (ref 101–111)
CO2: 26 mmol/L (ref 22–32)
CREATININE: 0.68 mg/dL (ref 0.44–1.00)
GFR calc non Af Amer: >60 mL/min (ref >60)
Glucose, Bld: 107 mg/dL — ABNORMAL HIGH (ref 65–99)
Potassium: 4.5 mmol/L (ref 3.5–5.1)
Sodium: 141 mmol/L (ref 135–145)

## 2015-12-20 LAB — CBC
HCT: 37.5 % (ref 36.0–46.0)
Hemoglobin: 12.1 g/dL (ref 12.0–15.0)
MCH: 29.7 pg (ref 26.0–34.0)
MCHC: 32.3 g/dL (ref 30.0–36.0)
MCV: 91.9 fL (ref 78.0–100.0)
PLATELETS: 406 10*3/uL — AB (ref 150–400)
RBC: 4.08 MIL/uL (ref 3.87–5.11)
RDW: 13.9 % (ref 11.5–15.5)
WBC: 8.1 10*3/uL (ref 4.0–10.5)

## 2015-12-20 LAB — GLUCOSE, CAPILLARY
Glucose-Capillary: 87 mg/dL (ref 65–99)
Glucose-Capillary: 86 mg/dL (ref 65–99)

## 2015-12-20 SURGERY — INSERTION, TUNNELED CENTRAL VENOUS DEVICE, WITH PORT
Anesthesia: General | Site: Chest | Laterality: Left

## 2015-12-20 MED ORDER — LIDOCAINE HCL (CARDIAC) 20 MG/ML IV SOLN
INTRAVENOUS | Status: AC
  Filled 2015-12-20: qty 5

## 2015-12-20 MED ORDER — FENTANYL CITRATE (PF) 100 MCG/2ML IJ SOLN
INTRAMUSCULAR | Status: DC | PRN
  Administered 2015-12-20: 50 ug via INTRAVENOUS
  Administered 2015-12-20: 25 ug via INTRAVENOUS

## 2015-12-20 MED ORDER — ACETAMINOPHEN 650 MG RE SUPP: 650.0000 mg | RECTAL | Status: DC | PRN

## 2015-12-20 MED ORDER — HEPARIN SOD (PORK) LOCK FLUSH 100 UNIT/ML IV SOLN
INTRAVENOUS | Status: DC | PRN
  Administered 2015-12-20: 500 [IU] via INTRAVENOUS

## 2015-12-20 MED ORDER — LIDOCAINE HCL (CARDIAC) 20 MG/ML IV SOLN
INTRAVENOUS | Status: DC | PRN
  Administered 2015-12-20: 100 mg via INTRAVENOUS

## 2015-12-20 MED ORDER — BUPIVACAINE-EPINEPHRINE (PF) 0.25% -1:200000 IJ SOLN
INTRAMUSCULAR | Status: DC | PRN
  Administered 2015-12-20: 7 mL via INTRADERMAL

## 2015-12-20 MED ORDER — PHENYLEPHRINE HCL 10 MG/ML IJ SOLN
INTRAMUSCULAR | Status: DC | PRN
  Administered 2015-12-20 (×2): 80 ug via INTRAVENOUS
  Administered 2015-12-20: 40 ug via INTRAVENOUS

## 2015-12-20 MED ORDER — PROPOFOL 10 MG/ML IV BOLUS
INTRAVENOUS | Status: AC
Start: 2015-12-20 — End: 2015-12-20
  Filled 2015-12-20: qty 20

## 2015-12-20 MED ORDER — EPHEDRINE SULFATE 50 MG/ML IJ SOLN
INTRAMUSCULAR | Status: AC
  Filled 2015-12-20: qty 1

## 2015-12-20 MED ORDER — DIPHENHYDRAMINE HCL 50 MG/ML IJ SOLN
INTRAMUSCULAR | Status: AC
  Filled 2015-12-20: qty 1

## 2015-12-20 MED ORDER — LACTATED RINGERS IV SOLN
INTRAVENOUS | Status: DC | PRN
  Administered 2015-12-20: 07:00:00 via INTRAVENOUS

## 2015-12-20 MED ORDER — 0.9 % SODIUM CHLORIDE (POUR BTL) OPTIME
TOPICAL | Status: DC | PRN
  Administered 2015-12-20: 1000 mL

## 2015-12-20 MED ORDER — HYDROMORPHONE HCL 1 MG/ML IJ SOLN
0.5000 mg | INTRAMUSCULAR | Status: DC | PRN
  Administered 2015-12-20 (×2): 0.5 mg via INTRAVENOUS

## 2015-12-20 MED ORDER — ONDANSETRON HCL 4 MG/2ML IJ SOLN
INTRAMUSCULAR | Status: AC
  Filled 2015-12-20: qty 2

## 2015-12-20 MED ORDER — ONDANSETRON HCL 4 MG/2ML IJ SOLN: 4.0000 mg | Freq: Once | INTRAMUSCULAR | Status: DC | PRN

## 2015-12-20 MED ORDER — DIPHENHYDRAMINE HCL 50 MG/ML IJ SOLN
12.5000 mg | Freq: Once | INTRAMUSCULAR | Status: AC
  Administered 2015-12-20: 12.5 mg via INTRAVENOUS

## 2015-12-20 MED ORDER — SODIUM CHLORIDE 0.9 % IV SOLN
INTRAVENOUS | Status: DC | PRN
  Administered 2015-12-20: 500 mL

## 2015-12-20 MED ORDER — SODIUM CHLORIDE 0.9 % IJ SOLN: 3.0000 mL | Freq: Two times a day (BID) | INTRAMUSCULAR | Status: DC

## 2015-12-20 MED ORDER — HEPARIN SOD (PORK) LOCK FLUSH 100 UNIT/ML IV SOLN
INTRAVENOUS | Status: AC
  Filled 2015-12-20: qty 5

## 2015-12-20 MED ORDER — LIDOCAINE HCL (PF) 1 % IJ SOLN
INTRAMUSCULAR | Status: AC
  Filled 2015-12-20: qty 30

## 2015-12-20 MED ORDER — HYDROMORPHONE HCL 1 MG/ML IJ SOLN
INTRAMUSCULAR | Status: AC
  Filled 2015-12-20: qty 1

## 2015-12-20 MED ORDER — BUPIVACAINE-EPINEPHRINE (PF) 0.25% -1:200000 IJ SOLN
INTRAMUSCULAR | Status: AC
  Filled 2015-12-20: qty 30

## 2015-12-20 MED ORDER — ROCURONIUM BROMIDE 50 MG/5ML IV SOLN
INTRAVENOUS | Status: AC
  Filled 2015-12-20: qty 1

## 2015-12-20 MED ORDER — PHENYLEPHRINE 40 MCG/ML (10ML) SYRINGE FOR IV PUSH (FOR BLOOD PRESSURE SUPPORT)
PREFILLED_SYRINGE | INTRAVENOUS | Status: AC
  Filled 2015-12-20: qty 10

## 2015-12-20 MED ORDER — ONDANSETRON HCL 4 MG/2ML IJ SOLN
INTRAMUSCULAR | Status: DC | PRN
  Administered 2015-12-20: 4 mg via INTRAVENOUS

## 2015-12-20 MED ORDER — MIDAZOLAM HCL 2 MG/2ML IJ SOLN
INTRAMUSCULAR | Status: AC
  Filled 2015-12-20: qty 2

## 2015-12-20 MED ORDER — SODIUM CHLORIDE 0.9 % IV SOLN: 250.0000 mL | INTRAVENOUS | Status: DC | PRN

## 2015-12-20 MED ORDER — HYDROMORPHONE HCL 1 MG/ML IJ SOLN: 0.5000 mg | INTRAMUSCULAR | Status: DC | PRN

## 2015-12-20 MED ORDER — SUCCINYLCHOLINE CHLORIDE 20 MG/ML IJ SOLN
INTRAMUSCULAR | Status: AC
  Filled 2015-12-20: qty 1

## 2015-12-20 MED ORDER — SODIUM CHLORIDE 0.9 % IJ SOLN: 3.0000 mL | INTRAMUSCULAR | Status: DC | PRN

## 2015-12-20 MED ORDER — ACETAMINOPHEN 325 MG PO TABS: 650.0000 mg | ORAL_TABLET | ORAL | Status: DC | PRN

## 2015-12-20 MED ORDER — PROPOFOL 10 MG/ML IV BOLUS
INTRAVENOUS | Status: DC | PRN
  Administered 2015-12-20: 200 mg via INTRAVENOUS

## 2015-12-20 MED ORDER — SODIUM CHLORIDE 0.9 % IJ SOLN
INTRAMUSCULAR | Status: AC
  Filled 2015-12-20: qty 10

## 2015-12-20 MED ORDER — OXYCODONE HCL 5 MG PO TABS: 5.0000 mg | ORAL_TABLET | ORAL | Status: DC | PRN

## 2015-12-20 MED ORDER — HYDROCODONE-ACETAMINOPHEN 5-325 MG PO TABS: 1.0000 | ORAL_TABLET | Freq: Four times a day (QID) | ORAL | Status: DC | PRN

## 2015-12-20 MED ORDER — FENTANYL CITRATE (PF) 250 MCG/5ML IJ SOLN
INTRAMUSCULAR | Status: AC
  Filled 2015-12-20: qty 5

## 2015-12-20 SURGICAL SUPPLY — 50 items
BAG DECANTER FOR FLEXI CONT (MISCELLANEOUS) ×3 IMPLANT
BLADE SURG 11 STRL SS (BLADE) ×3 IMPLANT
BLADE SURG 15 STRL LF DISP TIS (BLADE) ×1 IMPLANT
BLADE SURG 15 STRL SS (BLADE) ×2
CANISTER SUCTION 2500CC (MISCELLANEOUS) IMPLANT
CHLORAPREP W/TINT 10.5 ML (MISCELLANEOUS) ×3 IMPLANT
COVER PROBE W GEL 5X96 (DRAPES) ×3 IMPLANT
COVER SURGICAL LIGHT HANDLE (MISCELLANEOUS) ×3 IMPLANT
COVER TRANSDUCER ULTRASND GEL (DRAPE) IMPLANT
CRADLE DONUT ADULT HEAD (MISCELLANEOUS) ×3 IMPLANT
DECANTER SPIKE VIAL GLASS SM (MISCELLANEOUS) IMPLANT
DRAPE C-ARM 42X72 X-RAY (DRAPES) ×3 IMPLANT
DRAPE CHEST BREAST 15X10 FENES (DRAPES) ×3 IMPLANT
DRAPE UTILITY XL STRL (DRAPES) ×6 IMPLANT
DRAPE WARM FLUID 44X44 (DRAPE) IMPLANT
ELECT COATED BLADE 2.86 ST (ELECTRODE) ×3 IMPLANT
ELECT REM PT RETURN 9FT ADLT (ELECTROSURGICAL) ×3
ELECTRODE REM PT RTRN 9FT ADLT (ELECTROSURGICAL) ×1 IMPLANT
GAUZE SPONGE 4X4 16PLY XRAY LF (GAUZE/BANDAGES/DRESSINGS) ×3 IMPLANT
GEL ULTRASOUND 20GR AQUASONIC (MISCELLANEOUS) IMPLANT
GLOVE BIO SURGEON STRL SZ 6 (GLOVE) ×3 IMPLANT
GLOVE BIO SURGEON STRL SZ 6.5 (GLOVE) ×4 IMPLANT
GLOVE BIO SURGEONS STRL SZ 6.5 (GLOVE) ×2
GLOVE BIOGEL PI IND STRL 6.5 (GLOVE) ×2 IMPLANT
GLOVE BIOGEL PI INDICATOR 6.5 (GLOVE) ×4
GOWN STRL REUS W/ TWL LRG LVL3 (GOWN DISPOSABLE) ×1 IMPLANT
GOWN STRL REUS W/TWL 2XL LVL3 (GOWN DISPOSABLE) ×3 IMPLANT
GOWN STRL REUS W/TWL LRG LVL3 (GOWN DISPOSABLE) ×2
KIT BASIN OR (CUSTOM PROCEDURE TRAY) ×3 IMPLANT
KIT PORT POWER 8FR ISP CVUE (Catheter) ×3 IMPLANT
KIT ROOM TURNOVER OR (KITS) ×3 IMPLANT
LIQUID BAND (GAUZE/BANDAGES/DRESSINGS) ×3 IMPLANT
NEEDLE HYPO 25GX1X1/2 BEV (NEEDLE) ×3 IMPLANT
NS IRRIG 1000ML POUR BTL (IV SOLUTION) ×3 IMPLANT
PACK SURGICAL SETUP 50X90 (CUSTOM PROCEDURE TRAY) ×3 IMPLANT
PAD ARMBOARD 7.5X6 YLW CONV (MISCELLANEOUS) ×3 IMPLANT
PENCIL BUTTON HOLSTER BLD 10FT (ELECTRODE) ×3 IMPLANT
SUT MON AB 4-0 PC3 18 (SUTURE) ×3 IMPLANT
SUT PROLENE 2 0 SH DA (SUTURE) ×6 IMPLANT
SUT VIC AB 3-0 SH 27 (SUTURE) ×2
SUT VIC AB 3-0 SH 27X BRD (SUTURE) ×1 IMPLANT
SYR 20ML ECCENTRIC (SYRINGE) ×6 IMPLANT
SYR 5ML LUER SLIP (SYRINGE) ×3 IMPLANT
SYR BULB IRRIGATION 50ML (SYRINGE) ×3 IMPLANT
SYR CONTROL 10ML LL (SYRINGE) ×3 IMPLANT
TOWEL OR 17X24 6PK STRL BLUE (TOWEL DISPOSABLE) ×3 IMPLANT
TOWEL OR 17X26 10 PK STRL BLUE (TOWEL DISPOSABLE) ×3 IMPLANT
TUBE CONNECTING 12'X1/4 (SUCTIONS)
TUBE CONNECTING 12X1/4 (SUCTIONS) IMPLANT
YANKAUER SUCT BULB TIP NO VENT (SUCTIONS) IMPLANT

## 2015-12-20 NOTE — Progress Notes (Signed)
Location of Breast Cancer:Right Breast , Initial dx 09/07/15 Upper outer quadrant   Histology per Pathology Report: Diagnosis 11/25/15: 1. Breast, simple mastectomy, right - INVASIVE DUCTAL CARCINOMA, GRADE 2/3, SPANNING 2.0 CM.- DUCTAL CARCINOMA IN SITU, INTERMEDIATE GRADE.- LYMPHOVASCULAR INVASION IS IDENTIFIED. - LOBULAR NEOPLASIA (ATYPICAL LOBULAR HYPERPLASIA).- THE SURGICAL RESECTION MARGINS ARE NEGATIVE FOR CARCINOMA.- SEE ONCOLOGY TABLE BELOW. 2. Lymph node, sentinel, biopsy, right axillary #1 - METASTATIC CARCINOMA IN 1 OF 1 LYMPH NODE (1/1). 3. Lymph node, sentinel, biopsy, right axillary #2 - THERE IS NO EVIDENCE OF CARCINOMA IN 1 OF 1 LYMPH NODE (0/1). 4. Lymph node, sentinel, biopsy, right axillary #3 - THERE IS NO EVIDENCE OF CARCINOMA IN 1 OF 1 LYMPH NODE (0/1). 5. 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 6. Breast, excision, left, additional lateral margin - BENIGN FIBROADIPOSE TISSUE. - THERE IS NO EVIDENCE OF MALIGNANCY  Receptor Status: ER( +  ), PR (+ ), Her2-neu (+)  Did patient present with symptoms (if so, please note symptoms) or was this found on screening mammography?: screening  Past/Anticipated interventions by surgeon, if any: Sandra Lofty [place]illing[place], DO , Dr. Mardene Celeste Byerly,MD: 11/25/15 B/L mastectomy ,right Sln,bx,and reconstruction with expanders, follow up 3 months  Past/Anticipated interventions by medical oncology, if any: Chemotherapy :Dr. Burr Medico, chemo education class 12/22/14, appt Dr. Burr Medico  01/06/16  Lymphedema issues, if any:   Pain issues, if any:  SAFETY ISSUES:  Prior radiation? Yes Left breast in [place]  Pacemaker/ICD? NO  Possible current pregnancy? NO  Is the patient on methotrexate? NO  Current Complaints / other details: Menarche age 80,  [id number] 1st live birth age 82, no HRT,   Seen in Breast Clinic by Dr. Valere Dross ,Hx Left breast   Cancer with lumpectomy, level one axillary lymph node dissection, radiation therapy, and chemotherapy ,treated [place], [place] Allergies: Percocet Sandra Eaton, RN 12/20/2015,7:50 AM

## 2015-12-20 NOTE — Op Note (Signed)
PREOPERATIVE DIAGNOSIS:  Right breast cancer     POSTOPERATIVE DIAGNOSIS:  Same     PROCEDURE: Left subclavian port placement, Bard ClearVue  Power Port, MRI safe, 8-French.      SURGEON:  Stark Klein, MD      ANESTHESIA:  General   FINDINGS:  Good venous return, easy flush, and tip of the catheter and   SVC 21.5 cm.      SPECIMEN:  None.      ESTIMATED BLOOD LOSS:  Minimal.      COMPLICATIONS:  None known.      PROCEDURE:  Pt was identified in the holding area and taken to   the operating room, where patient was placed supine on the operating room   table.  General anesthesia was induced.  Patient's arms were tucked and the upper   chest and neck were prepped and draped in sterile fashion.  Time-out was   performed according to the surgical safety check list.  When all was   correct, we continued.   Local anesthetic was administered over this   area at the angle of the clavicle.  The vein was accessed with 1 pass of the needle. There was good venous return and the wire passed, but first passed into the neck.  It was withdrawn and repassed easily.     Fluoroscopy was used to confirm that the wire was in the vena cava.      The patient was placed back level and the area for the pocket was anethetized   with local anesthetic.  A 3-cm transverse incision was made with a #15   blade.  Cautery was used to divide the subcutaneous tissues down to the   pectoralis muscle.  An [employer] retractor was used to elevate the skin   while a pocket was created on top of the pectoralis fascia.  The port   was placed into the pocket to confirm that it was of adequate size.  The   catheter was preattached to the port.  The port was then secured to the   pectoralis fascia with four 2-0 Prolene sutures.  These were clamped and   not tied down yet.    The catheter was tunneled through to the wire exit   site.  The catheter was placed along the wire to determine what length it should be to be in  the SVC.  The catheter was cut at 21.5 cm.  The tunneler sheath and dilator were passed over the wire and the dilator and wire were removed.  The catheter was advanced through the tunneler sheath and the tunneler sheath was pulled away.  Care was taken to keep the catheter in the tunneler sheath as this occurred. This was advanced and the tunneler sheath was removed.  There was good venous   return and easy flush of the catheter.  The Prolene sutures were tied   down to the pectoral fascia.  The skin was reapproximated using 3-0   Vicryl interrupted deep dermal sutures.    Fluoroscopy was used to re-confirm good position of the catheter.  The skin   was then closed using 4-0 Monocryl in a subcuticular fashion.  The port was flushed with concentrated heparin flush as well.  The wounds were then cleaned, dried, and dressed with Dermabond.  The patient was awakened from anesthesia and taken to the PACU in stable condition.  Needle, sponge, and instrument counts were correct.  Stark Klein, MD

## 2015-12-20 NOTE — Anesthesia Procedure Notes (Signed)
Procedure Name: LMA Insertion Date/Time: 12/20/2015 7:39 AM Performed by: Carney Living Pre-anesthesia Checklist: Patient identified, Emergency Drugs available, Suction available, Patient being monitored and Timeout performed Patient Re-evaluated:Patient Re-evaluated prior to inductionOxygen Delivery Method: Circle system utilized Preoxygenation: Pre-oxygenation with 100% oxygen Intubation Type: IV induction LMA: LMA inserted LMA Size: 4.0 Number of attempts: 1 Placement Confirmation: positive ETCO2 and breath sounds checked- equal and bilateral Tube secured with: Tape Dental Injury: Teeth and Oropharynx as per pre-operative assessment

## 2015-12-20 NOTE — Transfer of Care (Signed)
Immediate Anesthesia Transfer of Care Note  Patient: Sandra James  Procedure(s) Performed: Procedure(s): INSERTION PORT-A-CATH, left chest (Left)  Patient Location: PACU  Anesthesia Type:General  Level of Consciousness: awake, alert , oriented and patient cooperative  Airway & Oxygen Therapy: Patient Spontanous Breathing and Patient connected to nasal cannula oxygen  Post-op Assessment: Report given to RN, Post -op Vital signs reviewed and stable and Patient moving all extremities X 4  Post vital signs: Reviewed and stable  Last Vitals:  Filed Vitals:   12/20/15 0608  BP: 109/79  Pulse: 81  Temp: 36.6 C  Resp: 16    Complications: No apparent anesthesia complications

## 2015-12-20 NOTE — Anesthesia Postprocedure Evaluation (Signed)
Anesthesia Post Note  Patient: Sandra James  Procedure(s) Performed: Procedure(s) (LRB): INSERTION PORT-A-CATH, left chest (Left)  Patient location during evaluation: PACU Anesthesia Type: General Level of consciousness: awake, awake and alert, oriented and patient cooperative Pain management: pain level controlled Vital Signs Assessment: post-procedure vital signs reviewed and stable Respiratory status: spontaneous breathing and respiratory function stable Cardiovascular status: blood pressure returned to baseline and stable Anesthetic complications: no    Last Vitals:  Filed Vitals:   12/20/15 0833 12/20/15 0845  BP: 134/89 116/77  Pulse: 95   Temp: 36.4 C   Resp: 15     Last Pain:  Filed Vitals:   12/20/15 0855  PainSc: 7                  Brice Potteiger EDWARD

## 2015-12-20 NOTE — Anesthesia Preprocedure Evaluation (Addendum)
Anesthesia Evaluation  Patient identified by MRN, date of birth, ID band Patient awake    Reviewed: Allergy & Precautions, NPO status , Patient's Chart, lab work & pertinent test results  Airway Mallampati: I  TM Distance: >3 FB     Dental  (+) Teeth Intact, Dental Advisory Given   Pulmonary    Pulmonary exam normal        Cardiovascular Normal cardiovascular exam+ Valvular Problems/Murmurs MVP      Neuro/Psych Anxiety Depression    GI/Hepatic GERD  Medicated and Controlled,  Endo/Other  diabetes, Well Controlled, Type 2, Oral Hypoglycemic Agents  Renal/GU      Musculoskeletal   Abdominal   Peds  Hematology   Anesthesia Other Findings Breast CA  Reproductive/Obstetrics                            Anesthesia Physical Anesthesia Plan  ASA: III  Anesthesia Plan: General   Post-op Pain Management:    Induction: Intravenous  Airway Management Planned: LMA and Oral ETT  Additional Equipment:   Intra-op Plan:   Post-operative Plan: Extubation in OR  Informed Consent: I have reviewed the patients History and Physical, chart, labs and discussed the procedure including the risks, benefits and alternatives for the proposed anesthesia with the patient or authorized representative who has indicated his/her understanding and acceptance.   Dental advisory given  Plan Discussed with: CRNA, Anesthesiologist and Surgeon  Anesthesia Plan Comments:        Anesthesia Quick Evaluation

## 2015-12-20 NOTE — H&P (View-Only) (Signed)
  Sandra James Location: [facility] Patient #: [id number] DOB: July 19, 1966 Single / Language: Sandra James / Race: Black or African American Female   History of Present Illness The patient is a 50 year old female who presents with breast cancer. Previous history Sandra James is a 50 year old female referred by Dr. Ottie Glazier for consultation regarding a new right breast cancer. The patient presented with screening detected calcifications on the right. This area was between 11 and 12:00 and was approximately 0.8 cm. 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 No lymphadenopathy was appreciated on mammogram or ultrasound.  Of note, the patient does have a history of left breast cancer previously. She had a lumpectomy with what sounds like a level one axillary lymph node dissection, radiation therapy, and chemotherapy. She desires bilateral mastectomies at this point. She did have genetic counseling with BRCA testing, however this was 12-14 years ago. Her previous breast cancer was treated in [place], [place].  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 She has 2 children that live in [place]. She does not smoke and uses alcohol only rarely. She does not have any other illicit drug use.  Menarche was at age 86. She is not having periods as she is status post hysterectomy with oophorectomy. She did not use any hormonal contraception. She has had 2 children with the first at age 9. She has not had any colonoscopy or bone density based on her age.]  The patient does desire bilateral mastectomies wtih reconstruction. She saw Dr. Marla Roe, and her surgery date is December. She is very anxious about this and wanted to discuss her options.   Allergies  Percocet *ANALGESICS - OPIOID*  Medication History  PriLOSEC ('20MG'$  Capsule DR, Oral) Active. No Current  Medications (Taken starting 10/08/2015) MetFORMIN HCl ('500MG'$  Tablet, Oral) Active. Medications Reconciled    Review of Systems All other systems negative  Vitals Weight: 160 lb Height: 66in Body Surface Area: 1.82 m Body Mass Index: 25.82 kg/m  Temp.: 97.8F(Temporal)  Pulse: 90 (Regular)  BP: 132/68 (Sitting, Left Arm, Standard)       Physical Exam General Mental Status-Alert. General Appearance-Consistent with stated age. Hydration-Well hydrated. Voice-Normal.  Breast Note: Nochange in exam. There is no palpable mass in either breast. The left breast has postoperative changes from her previous breast cancer and radiation.     Assessment & Plan CARCINOMA OF BREAST UPPER OUTER QUADRANT, RIGHT (C50.411) Impression: I discussed the possible options with the patient. I also reviewed the patient with Dr. Burr Medico. Her choices are to have mastectomies alone without reconstruction and have reconstruction at a later date. Her other choices to have a lumpectomy on the right, but then I think it would be difficult to get insurance to pay for her mastectomy later. I think the best option would be to start on tamoxifen neoadjuvant only. This way we would keep the December date. But she would also be actively getting treated. She is going to see Dr. Burr Medico next week to discuss this option. I'm not sure how this will plan to the HER-2 positivity of the tumor. Current Plans Pt Education - [facility] Free Text Education/Instructions: discussed with patient and provided information.   Signed by Stark Klein, MD

## 2015-12-20 NOTE — Interval H&P Note (Signed)
History and Physical Interval Note:  12/20/2015 6:59 AM  Sandra James  has presented today for surgery, with the diagnosis of BREAST CANCER  The various methods of treatment have been discussed with the patient and family. After consideration of risks, benefits and other options for treatment, the patient has consented to  Procedure(s): INSERTION PORT-A-CATH (N/A) as a surgical intervention .  The patient's history has been reviewed, patient examined, no change in status, stable for surgery.  I have reviewed the patient's chart and labs.  Questions were answered to the patient's satisfaction.     Jasmene Goswami

## 2015-12-20 NOTE — Telephone Encounter (Signed)
Called patient to offer earlier appt. For today, lvm for a return call

## 2015-12-20 NOTE — Telephone Encounter (Signed)
Spoke with Ragan today re appointment for echo/Bensimhon within 2 wks. No availability prior to 1/27 - 1st tx 1/26. Reeves Forth will call back re this appointment.

## 2015-12-20 NOTE — Telephone Encounter (Signed)
Camilla called back and per [facility] patient scheduled for echo/Bensimhon tomorrow. Per Reeves Forth she has spoken with patient aware. No other orders per 1/6 pof.

## 2015-12-21 ENCOUNTER — Telehealth: Payer: Self-pay | Admitting: *Deleted

## 2015-12-21 ENCOUNTER — Ambulatory Visit (HOSPITAL_BASED_OUTPATIENT_CLINIC_OR_DEPARTMENT_OTHER)
Admission: RE | Admit: 2015-12-21 | Discharge: 2015-12-21 | Disposition: A | Discharge status: Home / Self Care | Payer: BLUE CROSS/BLUE SHIELD | Source: Ambulatory Visit | Attending: Internal Medicine | Admitting: Internal Medicine

## 2015-12-21 ENCOUNTER — Encounter (HOSPITAL_COMMUNITY): Payer: Self-pay | Admitting: General Surgery

## 2015-12-21 ENCOUNTER — Ambulatory Visit (HOSPITAL_COMMUNITY)
Admission: RE | Admit: 2015-12-21 | Discharge: 2015-12-21 | Disposition: A | Discharge status: Home / Self Care | Payer: BLUE CROSS/BLUE SHIELD | Source: Ambulatory Visit | Attending: Internal Medicine | Admitting: Internal Medicine

## 2015-12-21 DIAGNOSIS — I517 Cardiomegaly: Secondary | ICD-10-CM | POA: Insufficient documentation

## 2015-12-21 DIAGNOSIS — C50411 Malignant neoplasm of upper-outer quadrant of right female breast: Secondary | ICD-10-CM | POA: Diagnosis present

## 2015-12-21 NOTE — Patient Instructions (Signed)
FOLLOW UP: 3 months with an Echocardiogram (per Dr.Bensimhon)

## 2015-12-21 NOTE — Telephone Encounter (Signed)
Received vm call from pt asking about disability papers,asking if we had received.  Message forwarded to [employer] & informed pt that they should call her back.

## 2015-12-21 NOTE — Progress Notes (Signed)
*  PRELIMINARY RESULTS* Echocardiogram 2D Echocardiogram has been performed.  Leavy Cella 12/21/2015, 12:25 PM

## 2015-12-21 NOTE — Progress Notes (Signed)
Patient ID: Sandra James, female   DOB: June 20, 1966, 50 y.o.   MRN: [id number]   CARDIO-ONCOLOGY CONSULT NOTE  Referring Physician: Burr Medico Primary Care: Kristie Cowman Primary Cardiologist:  HPI:  Sandra James is a 50 y.o. female with h/o DM2 and GERD who was diagnosed with right breast invasive ductal carcinoma pT1cN1aM0, stage IIB, grade 2, ER+/PR+, HER2+ with + 1 out of 3 + sentinel lymph nodes. Discovered on screening mammogram 09/07/15. She is now s/p bilateral mastectomy 11/30/15. She is referred to toe cardio-oncology clinic by Dr. Burr Medico  Of note, she had a left breast lumpectomy in 2004 and was treated at that time with Adriamycin.    Oncology History   Breast cancer of upper-outer quadrant of right female breast [facility])  Staging form: Breast, AJCC 7th Edition  Clinical stage from 09/15/2015: Stage IA (T1b, N0, M0) - Unsigned       Breast cancer of upper-outer quadrant of right female breast ([place])   09/07/2015 Mammogram Diagnostic mammogram and the ultrasound of the right breast showed a 0.8 cm lobulated mass in the right breast 11 to 12:00 position 9 cm from the nipple. No other lesions or adenopathy.   09/08/2015 Initial Diagnosis Breast cancer of upper-outer quadrant of right female breast ([place])   09/08/2015 Initial Biopsy Right breast mass at the upper outer quadrant core needle biopsy showed invasive ductal carcinoma, grade 2-3, ductal carcinoma in situ.   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 Left breast ostectomy was negative for malignancy.       She presents today to establish in the AHF/cardio-oncology  clinic. She feels well. Denies any significant cardiac history except for being told in past that she had MVP.  Denies exertional CP, SOB or edema. She will begin adjuvant therapy with Herceptin and Perjeta (TCHP) on 01/06/15 q 3 weeks for 6 cycles, and then complete a year long course of Herceptin.   ECHO today reviewed personally with her: EF 560% GLS -18.5% no MVP  Review of Systems: [y] = yes, [ ]  = no   General: Weight gain [ ] ; Weight loss [ ] ; Anorexia [ ] ; Fatigue [ ] ; Fever [ ] ; Chills [ ] ; Weakness [ ]   Cardiac: Chest pain/pressure [ ] ; Resting SOB [ ] ; Exertional SOB [ ] ; Orthopnea [ ] ; Pedal Edema [ ] ; Palpitations [ ] ; Syncope [ ] ; Presyncope [ ] ; Paroxysmal nocturnal dyspnea[ ]   Pulmonary: Cough [ ] ; Wheezing[ ] ; Hemoptysis[ ] ; Sputum [ ] ; Snoring [ ]   GI: Vomiting[ ] ; Dysphagia[ ] ; Melena[ ] ; Hematochezia [ ] ; Heartburn[ ] ; Abdominal pain [ ] ; Constipation [ ] ; Diarrhea [ ] ; BRBPR [ ]   GU: Hematuria[ ] ; Dysuria [ ] ; Nocturia[ ]   Vascular: Pain in legs with walking [ ] ; Pain in feet with lying flat [ ] ; Non-healing sores [ ] ; Stroke [ ] ; TIA [ ] ; Slurred speech [ ] ;  Neuro: Headaches[ ] ; Vertigo[ ] ; Seizures[ ] ; Paresthesias[ ] ;Blurred vision [ ] ; Diplopia [ ] ; Vision changes [ ]   Ortho/Skin: Arthritis [ ] ; Joint pain [ ] ; Muscle pain [ ] ; Joint swelling [ ] ; Back Pain [ ] ; Rash [ ]   Psych: Depression[ y]; Anxiety[y ]  Heme: Bleeding  problems [ ] ; Clotting disorders [ ] ; Anemia [ ]   Endocrine: Diabetes Blue.Reese ]; Thyroid dysfunction[ ]    Past Medical History  Diagnosis Date  . MVP (mitral valve prolapse)   . Infection     UTI  . Anxiety   . Fibroid   . [facility])     left brast lumpectomy   . Breast cancer of upper-outer quadrant of right female breast ([place]) 09/09/2015  . Diabetes mellitus without complication ([place])   . Depression   . GERD (gastroesophageal reflux disease)   . Complication of anesthesia     slow to wake up- BP was low, fainted next day-  . Breast cancer  ([place]) 09/07/15    right breast  . Allergy   . UTI (lower urinary tract infection)     Current Outpatient Prescriptions  Medication Sig Dispense Refill  . dexamethasone (DECADRON) 4 MG tablet Take 2 tablets (8 mg total) by mouth 2 (two) times daily. Start the day before Taxotere. Then again the day after chemo for 3 days. 30 tablet 1  . diazepam (VALIUM) 2 MG tablet Take 2 mg by mouth every 6 (six) hours as needed for muscle spasms.    Marland Kitchen HYDROcodone-acetaminophen (NORCO/VICODIN) 5-325 MG tablet Take 1 tablet by mouth every 6 (six) hours as needed for moderate pain. 30 tablet 0  . lidocaine-prilocaine (EMLA) cream Apply to affected area once 30 g 3  . metFORMIN (GLUCOPHAGE) 500 MG tablet Take 500 mg by mouth 2 (two) times daily with a meal.   0  . mirtazapine (REMERON) 15 MG tablet Take 1 tablet (15 mg total) by mouth at bedtime. 30 tablet 1  . omeprazole (PRILOSEC) 20 MG capsule Take 1 capsule (20 mg total) by mouth daily. (Patient taking differently: Take 20 mg by mouth as needed. As needed) 14 capsule 0  . ondansetron (ZOFRAN) 8 MG tablet Take 1 tablet (8 mg total) by mouth 2 (two) times daily. Start the day after chemo for 3 days. Then take as needed for nausea or vomiting. 30 tablet 1  . polyethylene glycol (MIRALAX / GLYCOLAX) packet Take 17 g by mouth daily as needed for mild constipation. 14 each 0  . prochlorperazine (COMPAZINE) 10 MG tablet Take 1 tablet (10 mg total) by mouth every 6 (six) hours as needed (Nausea or vomiting). 30 tablet 1   No current facility-administered medications for this encounter.    Allergies  Allergen Reactions  . Lisinopril Cough  . Oxycodone Itching  . Percocet [Oxycodone-Acetaminophen] Itching      Social History   Social History  . Marital Status: Single    Spouse Name: N/A  . Number of Children: N/A  . Years of Education: N/A   Occupational History  . Not on file.   Social History Main Topics  . Smoking status: Never Smoker   .  Smokeless tobacco: Never Used     Comment: previous secondhand smoke exposure  . Alcohol Use: 2.4 oz/week    4 Glasses of wine per week     Comment: 1 bottle of wine per wk  . Drug Use: No  . Sexual Activity: Yes    Birth Control/ Protection: Surgical   Other Topics Concern  . Not on file   Social History Narrative      Family History  Problem Relation Age of Onset  . Hypertension Mother   . Stroke Mother   . Alzheimer's disease Mother   . Heart disease Father   . Stroke  Father   . Heart attack Father   . Breast cancer Sister 61    double mastectomy  . Other Sister     "stomach tumor that wrapped around reproductive organs"; dx. 88s; required partial hysterectomy  . Alzheimer's disease Maternal Grandmother   . Alzheimer's disease Paternal Grandmother   . Other Other     had to have lymph nodes removed and radiation; dx. 90s  . Cancer Maternal Aunt     unspecified type; dx. <50  . Colon cancer Maternal Uncle     dx. 31s  . Breast cancer Cousin     dx. 50s    There were no vitals filed for this visit.  PHYSICAL EXAM: General:  Well appearing. No respiratory difficulty HEENT: normal Neck: supple. no JVD. Carotids 2+ bilat; no bruits. No lymphadenopathy or thryomegaly appreciated. Cor: s/p bilateral mastectomies. PMI nondisplaced. Regular rate & rhythm. No rubs, gallops or murmurs. Lungs: clear Abdomen: soft, nontender, nondistended. No hepatosplenomegaly. No bruits or masses. Good bowel sounds. Extremities: no cyanosis, clubbing, rash, edema Neuro: alert & oriented x 3, cranial nerves grossly intact. moves all 4 extremities w/o difficulty. Affect pleasant.   ASSESSMENT & PLAN:   1. Breast cancer - Pt has recently diagnosed right breast invasive ductal carcinoma, pT1cN1aM0, stage IIB, grade 2, ER+/PR+/HER2+  - She is planned to start therapy with Herceptin and Perjeta. - Now s/p bilateral mastectomy - Has a history of stage 1 L breast CA s/p lumpectomy ( now  as above, s/p B mastectomy) and was treated at that time with Adriamycin.   Explained incidence of Herceptin cardiotoxicity and role of Cardio-oncology clinic at length. Given h/o previous adriamycin therapy risk of toxicity can be as high as 30%. That said, herceptin toxicity is uniformly reversible with early detection and interruption of therapy. Echo images reviewed personally. All parameters stable. Reviewed signs and symptoms of HF to look for. Ok to start Herceptin. We will follow closely.  Follow-up with echo in 3 months.  Trea Latner,MD 10:31 PM

## 2015-12-22 ENCOUNTER — Ambulatory Visit: Payer: BLUE CROSS/BLUE SHIELD | Attending: Hematology | Admitting: Physical Therapy

## 2015-12-22 ENCOUNTER — Ambulatory Visit: Payer: BLUE CROSS/BLUE SHIELD | Admitting: Hematology

## 2015-12-22 DIAGNOSIS — M25612 Stiffness of left shoulder, not elsewhere classified: Secondary | ICD-10-CM | POA: Diagnosis present

## 2015-12-22 DIAGNOSIS — M25611 Stiffness of right shoulder, not elsewhere classified: Secondary | ICD-10-CM | POA: Diagnosis present

## 2015-12-22 DIAGNOSIS — R6889 Other general symptoms and signs: Secondary | ICD-10-CM | POA: Insufficient documentation

## 2015-12-22 DIAGNOSIS — M25512 Pain in left shoulder: Secondary | ICD-10-CM | POA: Diagnosis present

## 2015-12-22 DIAGNOSIS — R609 Edema, unspecified: Secondary | ICD-10-CM | POA: Diagnosis present

## 2015-12-22 DIAGNOSIS — M25511 Pain in right shoulder: Secondary | ICD-10-CM

## 2015-12-23 ENCOUNTER — Other Ambulatory Visit: Payer: BLUE CROSS/BLUE SHIELD

## 2015-12-23 ENCOUNTER — Telehealth: Payer: Self-pay | Admitting: *Deleted

## 2015-12-23 ENCOUNTER — Encounter: Payer: Self-pay | Admitting: Hematology

## 2015-12-23 NOTE — Progress Notes (Signed)
Location of Breast Cancer:Right Breast , Initial dx 09/07/15 Upper outer quadrant   Histology per Pathology Report: Diagnosis 11/25/15: 1. Breast, simple mastectomy, right - INVASIVE DUCTAL CARCINOMA, GRADE 2/3, SPANNING 2.0 CM.- DUCTAL CARCINOMA IN SITU, INTERMEDIATE GRADE.- LYMPHOVASCULAR INVASION IS IDENTIFIED. - LOBULAR NEOPLASIA (ATYPICAL LOBULAR HYPERPLASIA).- THE SURGICAL RESECTION MARGINS ARE NEGATIVE FOR CARCINOMA.- SEE ONCOLOGY TABLE BELOW. 2. Lymph node, sentinel, biopsy, right axillary #1 - METASTATIC CARCINOMA IN 1 OF 1 LYMPH NODE (1/1). 3. Lymph node, sentinel, biopsy, right axillary #2 - THERE IS NO EVIDENCE OF CARCINOMA IN 1 OF 1 LYMPH NODE (0/1). 4. Lymph node, sentinel, biopsy, right axillary #3 - THERE IS NO EVIDENCE OF CARCINOMA IN 1 OF 1 LYMPH NODE (0/1). 5. 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 6. Breast, excision, left, additional lateral margin - BENIGN FIBROADIPOSE TISSUE. - THERE IS NO EVIDENCE OF MALIGNANCY  Receptor Status: ER( +  ), PR (+ ), Her2-neu (+)  Did patient present with symptoms (if so, please note symptoms) or was this found on screening mammography?: screening  Past/Anticipated interventions by surgeon, if any: Sandra Lofty Dillingham, DO , Dr. Mardene Celeste Byerly,MD: 11/25/15 B/L mastectomy ,right Sln,bx,and reconstruction with expanders, follow up 3 months Port a cath placement  12/20/2015  Dr. Stark James,   Past/Anticipated interventions by medical oncology, if any: Chemotherapy :Dr. Burr James, chemo education class 12/22/14, appt Dr. Burr James  01/06/16 She will begin adjuvant therapy with Herceptin and Perjeta (TCHP) on 01/06/15 q 3 weeks for 6 cycles, and then complete a year long course of Herceptin.   Lymphedema=NO Pain issues, if any: right arm, tightness, & limited movement,   Had Physical therapy evaluation 12/23/15 , re-eval on 2/11./17  SAFETY  ISSUES:  Prior radiation? Yes Left breast in [place] at [facility] either 2004 or 2005) had 30 days treatment  Pacemaker/ICD? NO  Possible current pregnancy? NO  Is the patient on methotrexate? NO  Current Complaints / other details: Menarche age 74,  G18P2 1st live birth age 54, no HRT,   Seen in Breast Clinic by Dr. Valere James ,Hx Left breast  Cancer with lumpectomy, level one axillary lymph node dissection, radiation therapy, and chemotherapy ,treated [place], [place] Allergies: Percocet, Oxycodone, Lisinopril   BP 117/81 mmHg  Pulse 102  Temp(Src) 99.4 F (37.4 C) (Oral)  Resp 20  Ht _0  (1.676 m)  Wt 160 lb (72.576 kg)  BMI 25.84 kg/m2  Wt Readings from Last 3 Encounters:  12/27/15 160 lb (72.576 kg)  12/20/15 150 lb (68.04 kg)  12/15/15 150 lb 4.8 oz (68.176 kg)   Sandra Eaton, RN 12/23/2015,12:28 PM

## 2015-12-23 NOTE — Therapy (Addendum)
[place], [place], [address] Phone: [phone]   Fax:  [phone]  Physical Therapy Evaluation  Patient Details  Name: Sandra James MRN: [id number] Date of Birth: 06/16/1966 No Data Recorded  Encounter Date: 12/22/2015      PT End of Session - 12/23/15 1120    Visit Number 1   Number of Visits 4  to 8 (depends on her insurance copays, per pt.)   Date for PT Re-Evaluation 01/22/16   PT Start Time 1523   PT Stop Time 1602   PT Time Calculation (min) 39 min   Activity Tolerance Patient tolerated treatment well   Behavior During Therapy [facility] for tasks assessed/performed      Past Medical History  Diagnosis Date  . MVP (mitral valve prolapse)   . Infection     UTI  . Anxiety   . Fibroid   . Cancer [facility])     left brast lumpectomy   . Breast cancer of upper-outer quadrant of right female breast ([place]) 09/09/2015  . Diabetes mellitus without complication ([place])   . Depression   . GERD (gastroesophageal reflux disease)   . Complication of anesthesia     slow to wake up- BP was low, fainted next day-  . Breast cancer ([place]) 09/07/15    right breast  . Allergy   . UTI (lower urinary tract infection)     Past Surgical History  Procedure Laterality Date  . Abdominal hysterectomy    . Breast surgery  2004    left lumpectomy  . Oophorectomy Bilateral   . Mastectomy w/ sentinel node biopsy Bilateral 11/25/2015    Procedure: BILATERAL SKIN SPARING MASTECTOMIES WITH RIGHT SENTINEL LYMPH NODE BIOPSY;  Surgeon: Stark Klein, MD;  Location: [place];  Service: General;  Laterality: Bilateral;  . Breast reconstruction with placement of tissue expander and flex hd (acellular hydrated dermis) Bilateral 11/25/2015    Procedure: BILATERAL IMMEDIATE BREAST RECONSTRUCTION WITH PLACEMENT OF TISSUE EXPANDERS AND FLEX HD (ACELLULAR HYDRATED DERMIS);  Surgeon: Wallace Going, DO;  Location:  [place];  Service: Plastics;  Laterality: Bilateral;  . Portacath placement Left 12/20/2015    Procedure: INSERTION PORT-A-CATH, left chest;  Surgeon: Stark Klein, MD;  Location: [place];  Service: General;  Laterality: Left;    There were no vitals filed for this visit.  Visit Diagnosis:  Pain in joint, shoulder region, right - Plan: PT plan of care cert/re-cert  Pain in joint, shoulder region, left - Plan: PT plan of care cert/re-cert  Stiffness of joint, shoulder region, right - Plan: PT plan of care cert/re-cert  Stiffness of joint, shoulder region, left - Plan: PT plan of care cert/re-cert  Impaired function of upper extremity - Plan: PT plan of care cert/re-cert  Postoperative edema - Plan: PT plan of care cert/re-cert      Subjective Assessment - 12/22/15 1529    Subjective Tightness at right anterior shoulder and limited movement.   Pertinent History Left breast cancer in 2004 with lumpectomy, chemotherapy, and radiation.  Patient was diagnosed on 08/12/15 with right upper outer Triple positive breast cancer measuring 0.8 cm.  Ki67 is 60%.  Double mastectomy 11/25/15.  Had a port placed on left 12/20/15.  Starts chemo on 01/06/16.  Doesn't know yet about possible radiation treatment.  Diabetes under good control ; is on Metformin.  Had been exercising and watching diet prior to surgery.   Patient Stated Goals improve shoulder ROM  and function   Currently in Pain? Yes   Pain Score 3    Pain Location Chest  at port site   Pain Orientation Left   Pain Descriptors / Indicators Sore   Pain Type Surgical pain   Aggravating Factors  port placed two days ago   Pain Relieving Factors keeping still            [facility] PT Assessment - 12/23/15 0001    Assessment   Medical Diagnosis Right breast cancer   Onset Date/Surgical Date 11/25/15  double mastectomy with immediate reconstruction, expanders   Hand Dominance Right   Precautions   Precautions Other (comment)    Precaution Comments cancer precautions   Restrictions   Weight Bearing Restrictions No   [place] residence   Living Arrangements Non-relatives/Friends   Prior Function   Level of Independence Independent   Vocation Full time employment  and part time; currently on leave   Vocation Requirements 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    Skin Integrity Incisions are healing well as is Portacath site.     Quick DASH  31.82   AROM   Overall AROM Comments unable to assess left shoulder ROM fully today due to Portacath placement 2 days ago; patient also with pain at left axilla when IR and ER were attempted in supine.   Right Shoulder Extension 50 Degrees  standing   Right Shoulder Flexion 98 Degrees  sitting   Right Shoulder ABduction 74 Degrees  sitting   Right Shoulder Internal Rotation --  WFL   Right Shoulder External Rotation 51 Degrees  supine   Left Shoulder Extension --  left not assessed due to recent Portacath placement   Left Shoulder Flexion --  left not assessed due to recent Portacath placement   Left Shoulder ABduction --  left not assessed due to recent Portacath placement   Left Shoulder Internal Rotation --  limited by pain in supine   Left Shoulder External Rotation --  approx. -10 degrees from neutral and ltd. by pain   Strength   Overall Strength Comments resistance not attempted due to recent Portacath placement, but grossly 3-/5 based on linmited right shoulder AROM           LYMPHEDEMA/ONCOLOGY QUESTIONNAIRE - 12/22/15 1547    Surgeries   Mastectomy Date 11/25/15  bilateral    Number Lymph Nodes Removed 3  on right; on left, had ALND in 2004           Quick Dash - 12/23/15 0001    Open a tight or new jar Moderate difficulty   Do heavy household chores (wash walls, wash floors) Moderate difficulty   Carry a shopping bag or briefcase No difficulty   Wash your back Moderate difficulty   Use a knife to cut food No difficulty   Recreational activities in which you take some force or impact through your arm, shoulder, or hand (golf, hammering, tennis) Moderate difficulty   During the past week, to what extent has your arm, shoulder or hand problem interfered with your normal social activities with family, friends, neighbors, or groups? Slightly  During the past week, to what extent has your arm, shoulder or hand problem limited your work or other regular daily activities Modererately   Arm, shoulder, or hand pain. Moderate   Tingling (pins and needles) in your arm, shoulder, or hand None   Difficulty Sleeping Mild difficulty   DASH Score 31.82 %             [facility] Adult PT Treatment/Exercise - 12/23/15 0001    Self-Care   Self-Care Other Self-Care Comments   Other Self-Care Comments  fashioned a foam chip pack in stockinette and showed patient to place it (and can tie it up over her shoulder) at right axilla area of swelling; educated about its use   Manual Therapy   Manual Therapy Manual Lymphatic Drainage (MLD);Edema management   Manual Lymphatic Drainage (MLD) very brief manual lymph drainage in supine:  right groin, right axillo-inguinal anastomosis, and area of swelling just inferior to axilla (swollen, indurated fold of tissue)                PT Education - 12/23/15 1120    Education provided Yes   Education Details use of foam chip pack at right axilla to try to reduce swelling; wear comfortably and note whether it reduces swelling or softens induration there   Person(s) Educated Patient   Methods Explanation;Demonstration;Verbal cues    Comprehension Verbalized understanding              Breast Clinic Goals - 09/15/15 1046    Patient will be able to verbalize understanding of pertinent lymphedema risk reduction practices relevant to her diagnosis specifically related to skin care.   Time 1   Period Days   Status Achieved   Patient will be able to return demonstrate and/or verbalize understanding of the post-op home exercise program related to regaining shoulder range of motion.   Time 1   Period Days   Status Achieved   Patient will be able to verbalize understanding of the importance of attending the postoperative After Breast Cancer Class for further lymphedema risk reduction education and therapeutic exercise.   Time 1   Period Days   Status Achieved          Long Term Clinic Goals - 12/23/15 1136    CC Long Term Goal  #1   Title Patient will report at least 50% perceived decrease in swelling and discomfort at area of right axilla   Time 4   Period Weeks   Status New   CC Long Term Goal  #2   Title Patient will report at least 50% decrease in discomfort with moving arms   Time 4   Period Weeks   Status New   CC Long Term Goal  #3   Title bilateral shoulder flexion to at least 120 degrees for improved overhead reach   Time 4   Period Weeks   Status New   CC Long Term Goal  #4   Title bilateral shoulder abduction to at least 120 degrees for improved ADLs   Time 4   Period Weeks   Status New   CC Long Term Goal  #5   Title independent with HEP for shoulder ROM and self-care, including care of swelling at right axilla   Time 4   Period Weeks   Status New   Additional Goals   Additional Goals Yes            Plan - 12/23/15 1121    Clinical Impression  Statement Patient is a very pleasant 50 year-old female now undergoing treatment for her second occurrence of breast cancer; previous treatment was 12 years ago for left breast and newer diagnosis was in right breast.  She has now had a  bilateral mastectomy, will undergo chemotherapy, and does not yet know about radiation.  She just had a Portacath placed two days ago, so not all ROM and strength was tested today while that continues to heal.   Pt will benefit from skilled therapeutic intervention in order to improve on the following deficits Decreased range of motion;Pain;Increased edema;Impaired UE functional use   Rehab Potential Excellent   Clinical Impairments Affecting Rehab Potential Portacath placed 12/21/15; has expanders in place so will have fills from time to time; will start chemo soon   PT Frequency 2x / week   PT Duration 2 weeks  to 4 weeks; patient is concerned about copays and chemo side effects   PT Treatment/Interventions Passive range of motion;Manual lymph drainage;Compression bandaging;Therapeutic exercise;Patient/family education;ADLs/Self Care Home Management   PT Next Visit Plan Assess benefit of foam chip pack; assess left shoulder ROM if able (s/p Portacath placement); begin P/AA/AROM for right and left shoulders; manual lymph drainage for right axillary swelling prn; teach patient self-care and HEP   Consulted and Agree with Plan of Care Patient         Problem List Patient Active Problem List   Diagnosis Date Noted  . Genetic testing 10/08/2015  . History of left breast cancer 09/28/2015  . Family history of breast cancer in sister 09/28/2015  . Family history of colon cancer 09/28/2015  . Breast cancer of upper-outer quadrant of right female breast ([place]) 09/09/2015    Sandra James 12/23/2015, 11:42 AM  [place], [place], [address] Phone: [phone]   Fax:  [phone]  Name: Sandra James MRN: [id number] Date of Birth: [birth year]   Serafina Royals, PT 12/23/2015 11:42 AM   PHYSICAL THERAPY DISCHARGE SUMMARY  Visits from Start of Care: 1  Current functional level related to goals / functional  outcomes: Not met.   Remaining deficits: As at evaluation.   Education / Equipment: Pt. was given a foam chip pack to use at right axilla area of swelling to see if compression there would soften tissue and/or reduce swelling. Plan: Patient agrees to discharge.  Patient goals were not met. Patient is being discharged due to financial reasons.  ?????   The patient had scheduled several appointments but did not come to the first appointment after her evaluation.  When called about this, she stated she could not afford the copays for physical therapy.  Serafina Royals, PT 12/28/2015 5:48 PM

## 2015-12-23 NOTE — Telephone Encounter (Signed)
No additional note

## 2015-12-23 NOTE — Progress Notes (Signed)
I placed [place] form for dr. Burr Medico to sign

## 2015-12-24 ENCOUNTER — Encounter: Payer: Self-pay | Admitting: Hematology

## 2015-12-24 NOTE — Progress Notes (Signed)
I faxed the [place] [phone] and let patient know her cpy at front with ms. Wilma.

## 2015-12-27 ENCOUNTER — Encounter: Payer: Self-pay | Admitting: Radiation Oncology

## 2015-12-27 ENCOUNTER — Ambulatory Visit
Admission: RE | Admit: 2015-12-27 | Discharge: 2015-12-27 | Disposition: A | Discharge status: Home / Self Care | Payer: BLUE CROSS/BLUE SHIELD | Source: Ambulatory Visit | Attending: Radiation Oncology | Admitting: Radiation Oncology

## 2015-12-27 VITALS — BP 117/81 | HR 102 | Temp 99.4°F | Resp 20 | Ht 66.0 in | Wt 160.0 lb

## 2015-12-27 DIAGNOSIS — C50411 Malignant neoplasm of upper-outer quadrant of right female breast: Secondary | ICD-10-CM | POA: Diagnosis not present

## 2015-12-27 DIAGNOSIS — E119 Type 2 diabetes mellitus without complications: Secondary | ICD-10-CM | POA: Diagnosis not present

## 2015-12-27 DIAGNOSIS — Z7984 Long term (current) use of oral hypoglycemic drugs: Secondary | ICD-10-CM | POA: Diagnosis not present

## 2015-12-27 DIAGNOSIS — Z853 Personal history of malignant neoplasm of breast: Secondary | ICD-10-CM | POA: Diagnosis not present

## 2015-12-27 DIAGNOSIS — Z9013 Acquired absence of bilateral breasts and nipples: Secondary | ICD-10-CM | POA: Diagnosis not present

## 2015-12-27 DIAGNOSIS — Z170 Estrogen receptor positive status [ER+]: Secondary | ICD-10-CM | POA: Diagnosis not present

## 2015-12-27 DIAGNOSIS — Z79899 Other long term (current) drug therapy: Secondary | ICD-10-CM | POA: Diagnosis not present

## 2015-12-27 DIAGNOSIS — Z923 Personal history of irradiation: Secondary | ICD-10-CM | POA: Diagnosis not present

## 2015-12-27 DIAGNOSIS — Z9889 Other specified postprocedural states: Secondary | ICD-10-CM | POA: Diagnosis not present

## 2015-12-27 DIAGNOSIS — I341 Nonrheumatic mitral (valve) prolapse: Secondary | ICD-10-CM | POA: Diagnosis not present

## 2015-12-27 HISTORY — DX: Myoneural disorder, unspecified: G70.9

## 2015-12-27 NOTE — Progress Notes (Signed)
Please see the Nurse Progress Note in the MD Initial Consult Encounter for this patient. 

## 2015-12-27 NOTE — Progress Notes (Signed)
Radiation Oncology         [phone]) [phone] ________________________________  Name: Sandra James MRN: [id number]  Date: 12/27/2015  DOB: [birth year]  YI:RSWNI,OEVOJJ G, MD  Stark Klein, MD     REFERRING PHYSICIAN: Stark Klein, MD   DIAGNOSIS: The encounter diagnosis was Breast cancer of upper-outer quadrant of right female breast ([place]).  Breast cancer of upper-outer quadrant of right female breast [facility])   Staging form: Breast, AJCC 7th Edition     Clinical stage from 09/15/2015: Stage IA (T1b, N0, M0) - Signed by Truitt Merle, MD on 12/16/2015     Pathologic stage from 11/25/2015: Stage IIA (T1c, N1a, cM0) - Signed by Truitt Merle, MD on 12/16/2015    HISTORY OF PRESENT ILLNESS::Sandra James is a 50 y.o. 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 Burr Medico.   At the time of a screening mammogram she was found to have suspicious calcifications within the right breast. Ultrasound showed a 0.8 cm mass at approximately 11 to 12:00. This was biopsied on 09/07/2015 and felt to be diagnostic for invasive ductal carcinoma/DCIS. Her disease was ER and PR positive at 100% with an elevated Ki-67 of 60%. The tumor was also HER-2/neu positive. She has had a previous left breast cancer and did have negative BRCA testing in 2011; she has undergone BSO in the past. Her left breast cancer was diagnosed at age 58, and she was treated in [place] with lumpectomy, radiation, and adjuvant chemotherapy and 5 years of tamoxifen.   She was taken to the OR on 11/25/15 and had a bilateral skin sparing mastectomy with right sentinel lymph node biopsy with tissue expander placement. Final pathology revealed benign left breast tissue. 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 Resection margins were negative, and of the 3 nodes sampled, one did contain metastatic disease.   She has met with Dr. Burr Medico and will  undergo staging CT and bone scan this week. She has plans to begin chemotherapy on 01/05/15, and will start tamoxifen (as she cannot tolerate AIs) following adjuvant radiation.     PREVIOUS RADIATION THERAPY: Yes; Left breast in [place], [place] at [facility] either 2004 or 2005) had 30 days treatment   PAST MEDICAL HISTORY:  has a past medical history of MVP (mitral valve prolapse); Infection; Anxiety; Fibroid; Cancer ([place]); Breast cancer of upper-outer quadrant of right female breast ([place]) (09/09/2015); Diabetes mellitus without complication ([place]); Depression; GERD (gastroesophageal reflux disease); Complication of anesthesia; Breast cancer ([place]) (09/07/15); Allergy; UTI (lower urinary tract infection); and Neuromuscular disorder ([place]).     PAST SURGICAL HISTORY: Past Surgical History  Procedure Laterality Date  . Abdominal hysterectomy    . Breast surgery  2004    left lumpectomy  . Oophorectomy Bilateral   . Mastectomy w/ sentinel node biopsy Bilateral 11/25/2015    Procedure: BILATERAL SKIN SPARING MASTECTOMIES WITH RIGHT SENTINEL LYMPH NODE BIOPSY;  Surgeon: Stark Klein, MD;  Location: [place];  Service: General;  Laterality: Bilateral;  . Breast reconstruction with placement of tissue expander and flex hd (acellular hydrated dermis) Bilateral 11/25/2015    Procedure: BILATERAL IMMEDIATE BREAST RECONSTRUCTION WITH PLACEMENT OF TISSUE EXPANDERS AND FLEX HD (ACELLULAR HYDRATED DERMIS);  Surgeon: Wallace Going, DO;  Location: [place];  Service: Plastics;  Laterality: Bilateral;  . Portacath placement Left 12/20/2015  Procedure: INSERTION PORT-A-CATH, left chest;  Surgeon: Stark Klein, MD;  [facility];  Service: General;  Laterality: Left;     FAMILY HISTORY: family history includes Alzheimer's disease in her maternal grandmother, mother, and paternal grandmother; Breast cancer in her cousin; Breast cancer (age of  onset: 41) in her sister; Cancer in her maternal aunt; Colon cancer in her maternal uncle; Heart attack in her father; Heart disease in her father; Hypertension in her mother; Other in her other and sister; Stroke in her father and mother.   SOCIAL HISTORY:  reports that she has never smoked. She has never used smokeless tobacco. She reports that she drinks about 2.4 oz of alcohol per week. She reports that she does not use illicit drugs.   ALLERGIES: Lisinopril; Oxycodone; and Percocet   MEDICATIONS:  Current Outpatient Prescriptions  Medication Sig Dispense Refill  . diazepam (VALIUM) 2 MG tablet Take 2 mg by mouth every 6 (six) hours as needed for muscle spasms. Reported on 12/22/2015    . HYDROcodone-acetaminophen (NORCO/VICODIN) 5-325 MG tablet Take 1 tablet by mouth every 6 (six) hours as needed for moderate pain. 30 tablet 0  . metFORMIN (GLUCOPHAGE) 500 MG tablet Take 500 mg by mouth 2 (two) times daily with a meal. Reported on 12/22/2015  0  . mirtazapine (REMERON) 15 MG tablet Take 1 tablet (15 mg total) by mouth at bedtime. 30 tablet 1  . polyethylene glycol (MIRALAX / GLYCOLAX) packet Take 17 g by mouth daily as needed for mild constipation. 14 each 0  . dexamethasone (DECADRON) 4 MG tablet Take 2 tablets (8 mg total) by mouth 2 (two) times daily. Start the day before Taxotere. Then again the day after chemo for 3 days. (Patient not taking: Reported on 12/22/2015) 30 tablet 1  . lidocaine-prilocaine (EMLA) cream Apply to affected area once (Patient not taking: Reported on 12/22/2015) 30 g 3  . omeprazole (PRILOSEC) 20 MG capsule Take 1 capsule (20 mg total) by mouth daily. (Patient not taking: Reported on 12/22/2015) 14 capsule 0  . ondansetron (ZOFRAN) 8 MG tablet Take 1 tablet (8 mg total) by mouth 2 (two) times daily. Start the day after chemo for 3 days. Then take as needed for nausea or vomiting. (Patient not taking: Reported on 12/22/2015) 30 tablet 1  . prochlorperazine (COMPAZINE)  10 MG tablet Take 1 tablet (10 mg total) by mouth every 6 (six) hours as needed (Nausea or vomiting). (Patient not taking: Reported on 12/22/2015) 30 tablet 1   No current facility-administered medications for this encounter.     REVIEW OF SYSTEMS:  A 15 point review of systems is documented in the electronic medical record. This was obtained by the nursing staff. However, I reviewed this with the patient to discuss relevant findings and make appropriate changes.  Pertinent items are noted in HPI.   Her only complaint at this time is limited arm movement in her right arm after mastectomy. She has been performing exercises with physical rehabilitation but may not be able to continue due to financial concerns. She plans to continue exercises learned in these classes at home.   PHYSICAL EXAM:  height is 5' 6" (1.676 m) and weight is 160 lb (72.576 kg). Her oral temperature is 99.4 F (37.4 C). Her blood pressure is 117/81 and her pulse is 102. Her respiration is 20.   ECOG = 1  0 - Asymptomatic (Fully active, able to carry on all predisease activities without restriction)  1 - Symptomatic but  completely ambulatory (Restricted in physically strenuous activity but ambulatory and able to carry out work of a light or sedentary nature. For example, light housework, office work)  2 - Symptomatic, <50% in bed during the day (Ambulatory and capable of all self care but unable to carry out any work activities. Up and about more than 50% of waking hours)  3 - Symptomatic, >50% in bed, but not bedbound (Capable of only limited self-care, confined to bed or chair 50% or more of waking hours)  4 - Bedbound (Completely disabled. Cannot carry on any self-care. Totally confined to bed or chair)  5 - Death   Eustace Pen MM, Creech RH, Tormey DC, et al. [phone]). "Toxicity and response criteria of the [facility] Group". [place] Oncol. 5 (6): 649-55  General: Well-developed, in no acute  distress HEENT: Normocephalic, atraumatic; oral cavity clear Neck: Supple without any lymphadenopathy Cardiovascular: Regular rate and rhythm Respiratory: Clear to auscultation bilaterally Breasts: Right breast well healing with some tightness present in the axillary region. No lymphadenopathy. Left breast well healing. GI: Soft, nontender, normal bowel sounds Extremities: No edema present. Limited ROM with elevation of the right arm.  Neuro: No focal deficits    LABORATORY DATA:  Lab Results  Component Value Date   WBC 8.1 12/20/2015   HGB 12.1 12/20/2015   HCT 37.5 12/20/2015   MCV 91.9 12/20/2015   PLT 406* 12/20/2015   Lab Results  Component Value Date   NA 141 12/20/2015   K 4.5 12/20/2015   CL 105 12/20/2015   CO2 26 12/20/2015   Lab Results  Component Value Date   ALT 17 11/12/2015   AST 17 11/12/2015   ALKPHOS 79 11/12/2015   BILITOT 0.89 11/12/2015      RADIOGRAPHY: Dg Fluoro Guide Cv Line-no Report  12/20/2015  CLINICAL DATA:  FLOURO GUIDE CV LINE Fluoroscopy was utilized by the requesting physician.  No radiographic interpretation.       IMPRESSION:  Oncology History   Breast cancer [facility])   Staging form: Breast, AJCC 7th Edition     Pathologic stage from 11/25/2015: Stage IIA (T1c, N1a, cM0) - Unsigned     Pathologic stage from 11/25/2015: Stage IIA (T1c, N1a, cM0) - Unsigned Breast cancer of upper-outer quadrant of right female breast ([place])   Staging form: Breast, AJCC 7th Edition     Clinical stage from 09/15/2015: Stage IA (T1b, N0, M0) - Signed by Truitt Merle, MD on 12/16/2015     Pathologic stage from 11/25/2015: Stage IIA (T1c, N1a, cM0) - Signed by Truitt Merle, MD on 12/16/2015         Breast cancer of upper-outer quadrant of right female breast ([place])   09/07/2015 Mammogram Diagnostic mammogram and the ultrasound of the right breast showed a 0.8 cm lobulated mass in the right breast 11 to 12:00 position 9 cm from the nipple. No other lesions or  adenopathy.   09/08/2015 Initial Diagnosis Breast cancer of upper-outer quadrant of right female breast ([place])   09/08/2015 Initial Biopsy Right breast mass at the upper outer quadrant core needle biopsy showed invasive ductal carcinoma, grade 2-3, ductal carcinoma in situ.   09/08/2015 Receptors her2 ER 100% positive, PR 100% positive, Ki-67 60%, HER-2 positive with copy # 4.25 and ratio 2.58   11/25/2015 Surgery Right breast mastectomy and sentinel lymph node biopsy   11/25/2015 Pathology Results Right breast invasive ductal carcinoma, grade 2, 2.0 cm, DCIS, intermediate grade, (+) LVI, margins were negative, 1  out of 3 lymph nodes were negative. Left breast ostectomy was negative for malignancy.    The patient has a recent diagnosis of invasive ductal carcinoma of the right breast. She has a history of left-sided breast cancer for which she previously underwent breast conservation surgery in 2004. This was completed in [place] and we will obtain her radiation records. She has completed surgery and appears to be doing satisfactorily postoperatively. She completed a bilateral mastectomy and unfortunately was found to have node positive disease on the right and she is appropriate for postmastectomy radiation treatment. She does have an expander in place. We discussed the interaction of radiation treatment with reconstruction. We also discussed the importance of postmastectomy radiation treatment given her age and surgical findings.  I discussed with the patient the role of adjuvant radiation treatment in this setting. We discussed the potential benefit of radiation treatment, especially with regards to local control of the patient's tumor. We also discussed the possible side effects and risks of such a treatment as well.   All of the patient's questions were answered. The patient wishes to proceed with radiation treatment at the appropriate time.  PLAN: The patient will proceed with simulation, and this  will be completed after she has finished chemotherapy.  I anticipate treating the patient with a course of adjuvant radiation treatment to the right chest wall and supraclavicular region for 6.5 weeks.       ________________________________   Jodelle Gross, MD, PhD   **Disclaimer: This note was dictated with voice recognition software. Similar sounding words can inadvertently be transcribed and this note may contain transcription errors which may not have been corrected upon publication of note.**  This document serves as a record of services personally performed by Kyung Rudd, MD. It was created on his behalf by Arlyce Harman, a trained medical scribe. The creation of this record is based on the scribe's personal observations and the provider's statements to them. This document has been checked and approved by the attending provider.

## 2015-12-28 ENCOUNTER — Ambulatory Visit: Payer: BLUE CROSS/BLUE SHIELD | Admitting: Physical Therapy

## 2015-12-29 ENCOUNTER — Encounter (HOSPITAL_COMMUNITY)
Admission: RE | Admit: 2015-12-29 | Discharge: 2015-12-29 | Disposition: A | Discharge status: Home / Self Care | Payer: BLUE CROSS/BLUE SHIELD | Source: Ambulatory Visit | Attending: Hematology | Admitting: Hematology

## 2015-12-29 ENCOUNTER — Ambulatory Visit (HOSPITAL_COMMUNITY)
Admission: RE | Admit: 2015-12-29 | Discharge: 2015-12-29 | Disposition: A | Discharge status: Home / Self Care | Payer: BLUE CROSS/BLUE SHIELD | Source: Ambulatory Visit | Attending: Hematology | Admitting: Hematology

## 2015-12-29 ENCOUNTER — Encounter: Payer: Self-pay | Admitting: Hematology

## 2015-12-29 ENCOUNTER — Encounter (HOSPITAL_COMMUNITY): Payer: Self-pay

## 2015-12-29 DIAGNOSIS — K769 Liver disease, unspecified: Secondary | ICD-10-CM | POA: Insufficient documentation

## 2015-12-29 DIAGNOSIS — J90 Pleural effusion, not elsewhere classified: Secondary | ICD-10-CM | POA: Insufficient documentation

## 2015-12-29 DIAGNOSIS — C50411 Malignant neoplasm of upper-outer quadrant of right female breast: Secondary | ICD-10-CM | POA: Diagnosis present

## 2015-12-29 DIAGNOSIS — R918 Other nonspecific abnormal finding of lung field: Secondary | ICD-10-CM | POA: Diagnosis not present

## 2015-12-29 DIAGNOSIS — Z9013 Acquired absence of bilateral breasts and nipples: Secondary | ICD-10-CM | POA: Diagnosis not present

## 2015-12-29 MED ORDER — TECHNETIUM TC 99M MEDRONATE IV KIT
25.0000 | PACK | Freq: Once | INTRAVENOUS | Status: AC | PRN
  Administered 2015-12-29: 26.7 via INTRAVENOUS

## 2015-12-29 MED ORDER — IOHEXOL 300 MG/ML  SOLN
100.0000 mL | Freq: Once | INTRAMUSCULAR | Status: AC | PRN
  Administered 2015-12-29: 100 mL via INTRAVENOUS

## 2015-12-29 NOTE — Progress Notes (Signed)
Met with patient in my office to introduce myself as Estate manager/land agent and to see if she had any financial questions or concerns. Patient states she needs all the help she can get. Patient was referred by navigator. I asked patient for income information for grant purposes. Approved pt for $1000 [employer] and explained how it works. Patient has copy of award letter and expenses it covers as well as our outpatient pharmacy information. Gave patient applications for Cancer Care,[employer], and [employer]. Patient will bring applications and supporting documents when she returns on 01/05/15. Patient has my card for any additional questions or concerns.

## 2015-12-31 ENCOUNTER — Ambulatory Visit: Payer: BLUE CROSS/BLUE SHIELD | Admitting: Physical Therapy

## 2016-01-03 ENCOUNTER — Encounter: Payer: BLUE CROSS/BLUE SHIELD | Admitting: Physical Therapy

## 2016-01-04 ENCOUNTER — Encounter: Payer: Self-pay | Admitting: *Deleted

## 2016-01-04 ENCOUNTER — Telehealth: Payer: Self-pay | Admitting: *Deleted

## 2016-01-04 ENCOUNTER — Telehealth: Payer: Self-pay | Admitting: Hematology

## 2016-01-04 ENCOUNTER — Encounter: Payer: Self-pay | Admitting: Hematology

## 2016-01-04 NOTE — Progress Notes (Signed)
Patient due for F/U and first treatment on 1/26.  Has not been to a class. Called patient, she will come on 1/25 to meet with nurse at [facility].  Verbalized understanding.

## 2016-01-04 NOTE — Telephone Encounter (Signed)
I checked with Sharyn Lull and we do not have any opening for chemo before this Friday.   Thu, please let pt know and remind her to keep her appointments.  Truitt Merle  01/04/2016

## 2016-01-04 NOTE — Telephone Encounter (Signed)
My prior telephone call documentation from today was an error, please discard.   I will get her letter ready for her.  Sandra James  01/04/2016

## 2016-01-04 NOTE — Telephone Encounter (Signed)
VM message received from patient requesting call back. Call returned to patient. She is requesting a letter from Dr. Burr Medico be fax'd to her work-benefits department that specifies that patient is under Dr. Ernestina Penna care with the beginning date of 11/24/15.   This is to be fax'd to [phone], attention "Benefits'

## 2016-01-05 ENCOUNTER — Encounter: Payer: BLUE CROSS/BLUE SHIELD | Admitting: Physical Therapy

## 2016-01-05 ENCOUNTER — Encounter: Payer: Self-pay | Admitting: *Deleted

## 2016-01-05 ENCOUNTER — Other Ambulatory Visit: Payer: BLUE CROSS/BLUE SHIELD

## 2016-01-05 ENCOUNTER — Ambulatory Visit: Payer: BLUE CROSS/BLUE SHIELD | Admitting: Physical Therapy

## 2016-01-05 DIAGNOSIS — M25511 Pain in right shoulder: Secondary | ICD-10-CM | POA: Diagnosis not present

## 2016-01-05 DIAGNOSIS — M25612 Stiffness of left shoulder, not elsewhere classified: Secondary | ICD-10-CM

## 2016-01-05 DIAGNOSIS — M25512 Pain in left shoulder: Secondary | ICD-10-CM

## 2016-01-05 DIAGNOSIS — M25611 Stiffness of right shoulder, not elsewhere classified: Secondary | ICD-10-CM

## 2016-01-05 NOTE — Therapy (Signed)
[place], [place], [address] Phone: [phone]   Fax:  [phone]  Physical Therapy Treatment  Patient Details  Name: Jolani Gindi MRN: [id number] Date of Birth: [birth year] Referring Provider: Dr. Truitt Merle  Encounter Date: 01/05/2016      PT End of Session - 01/05/16 0940    Visit Number 2   Number of Visits 4  to 8   Date for PT Re-Evaluation 02/05/16   PT Start Time [id number]   PT Stop Time 0936   PT Time Calculation (min) 39 min   Activity Tolerance Patient tolerated treatment well   Behavior During Therapy [facility] for tasks assessed/performed      Past Medical History  Diagnosis Date  . MVP (mitral valve prolapse)   . Infection     UTI  . Anxiety   . Fibroid   . Cancer [facility])     left brast lumpectomy   . Breast cancer of upper-outer quadrant of right female breast ([place]) 09/09/2015  . Diabetes mellitus without complication ([place])   . Depression   . GERD (gastroesophageal reflux disease)   . Complication of anesthesia     slow to wake up- BP was low, fainted next day-  . Breast cancer ([place]) 09/07/15    right breast  . Allergy   . UTI (lower urinary tract infection)   . Neuromuscular disorder ([place])     sciatic nerve paion left side/leg    Past Surgical History  Procedure Laterality Date  . Abdominal hysterectomy    . Breast surgery  2004    left lumpectomy  . Oophorectomy Bilateral   . Mastectomy w/ sentinel node biopsy Bilateral 11/25/2015    Procedure: BILATERAL SKIN SPARING MASTECTOMIES WITH RIGHT SENTINEL LYMPH NODE BIOPSY;  Surgeon: Stark Klein, MD;  Location: [facility];  Service: General;  Laterality: Bilateral;  . Breast reconstruction with placement of tissue expander and flex hd (acellular hydrated dermis) Bilateral 11/25/2015    Procedure: BILATERAL IMMEDIATE BREAST RECONSTRUCTION WITH PLACEMENT OF TISSUE EXPANDERS AND FLEX HD (ACELLULAR HYDRATED DERMIS);   Surgeon: Wallace Going, DO;  Location: [facility];  Service: Plastics;  Laterality: Bilateral;  . Portacath placement Left 12/20/2015    Procedure: INSERTION PORT-A-CATH, left chest;  Surgeon: Stark Klein, MD;  Location: [facility];  Service: General;  Laterality: Left;    There were no vitals filed for this visit.  Visit Diagnosis:  Stiffness of joint, shoulder region, right - Plan: PT plan of care cert/re-cert  Stiffness of joint, shoulder region, left - Plan: PT plan of care cert/re-cert  Pain in joint, shoulder region, right - Plan: PT plan of care cert/re-cert  Pain in joint, shoulder region, left - Plan: PT plan of care cert/re-cert      Subjective Assessment - 01/05/16 0858    Subjective Portacath has healed at this point.  Nothing else new.  Chip pack helped some with swelling at right underarm.  Patient returns today to resume treatment;  by phone at an earlier missed appointment she had requested to be discharged, but has decided to return.   Currently in Pain? Yes   Pain Score 0-No pain  up to 4   Pain Location Axilla   Pain Orientation Right   Aggravating Factors  lying on right side   Pain Relieving Factors when the area is less indurated            [facility] PT Assessment - 01/05/16 0001  Assessment   Referring Provider Dr. Truitt Merle   Observation/Other Assessments   Observations right axilla area looks much less indurated and with fewer skin folds today; cording at right axilla visible and palpable with arm in abduction   Skin Integrity skin at Portacath site looks good   AROM   Right Shoulder Flexion 103 Degrees   Right Shoulder ABduction 78 Degrees   Left Shoulder Flexion 126 Degrees   Left Shoulder ABduction 80 Degrees   Left Shoulder Internal Rotation --  [facility]   Left Shoulder External Rotation 44 Degrees   Palpation   Palpation comment area at right axilla is softer than on previous visit, though has an area of firmness still                      [facility] Adult PT Treatment/Exercise - 01/05/16 0001    Shoulder Exercises: Supine   Other Supine Exercises dowel stretches for flexion and abduction, unilateral but for both left and right, 5 counts x 5 each   Manual Therapy   Manual Therapy Myofascial release;Soft tissue mobilization;Neural Stretch   Soft tissue mobilization at cording in right axilla while stretching into abduction   Myofascial Release gentle pulling for release at pect muscle insertion area; gentle UE pulling left and right   Passive ROM left and right shoulder PROM to tolerance with gentle stretch for ER, abduction, and flexion   Neural Stretch right UE                PT Education - 01/05/16 0939    Education provided Yes   Education Details dowel stretches for unilateral shoulder flexion and abduction, for right and left   Person(s) Educated Patient   Methods Explanation;Demonstration;Handout   Comprehension Verbalized understanding;Returned demonstration              Breast Clinic Goals - 09/15/15 1046    Patient will be able to verbalize understanding of pertinent lymphedema risk reduction practices relevant to her diagnosis specifically related to skin care.   Time 1   Period Days   Status Achieved   Patient will be able to return demonstrate and/or verbalize understanding of the post-op home exercise program related to regaining shoulder range of motion.   Time 1   Period Days   Status Achieved   Patient will be able to verbalize understanding of the importance of attending the postoperative After Breast Cancer Class for further lymphedema risk reduction education and therapeutic exercise.   Time 1   Period Days   Status Achieved          Long Term Clinic Goals - 01/05/16 0944    CC Long Term Goal  #1   Title Patient will report at least 50% perceived decrease in swelling and discomfort at area of right axilla   Status On-going   CC Long Term Goal  #2    Title Patient will report at least 50% decrease in discomfort with moving arms   Status On-going   CC Long Term Goal  #3   Title bilateral shoulder flexion to at least 120 degrees for improved overhead reach   Status On-going   CC Long Term Goal  #4   Title bilateral shoulder abduction to at least 120 degrees for improved ADLs   Status On-going   CC Long Term Goal  #5   Title independent with HEP for shoulder ROM and self-care, including care of swelling at right axilla   Status On-going  Plan - 01/05/16 0940    Clinical Impression Statement Patient did well with session today, but PROM limited by pain.  Right axillary swelling has decreased a bit, with decreased induration.  Definite cording at right axilla when shoulder is in abduction.  Right shoulder active flexion and abduction were slightly improved today as measured at beginning of session; left side was measured.  Patient reported feeling stretched out at end of session.   Pt will benefit from skilled therapeutic intervention in order to improve on the following deficits Decreased range of motion;Pain;Increased edema;Impaired UE functional use   Rehab Potential Excellent   Clinical Impairments Affecting Rehab Potential Portacath placed 12/21/15; has expanders in place so will have fills from time to time; will start chemo soon (01/06/16)   PT Frequency 2x / week   PT Duration 2 weeks   PT Treatment/Interventions Passive range of motion;Manual techniques;Patient/family education;Therapeutic exercise   PT Next Visit Plan Patient starts chemo tomorrow and doesn't want to schedule more visits until she sees how she does with it.  At that time, remeasure ROM and assess swelling, continue manual therapy for ROM and swelling.   PT Home Exercise Plan dowel exercises for shoulder ROM 2-3x/day   Consulted and Agree with Plan of Care Patient        Problem List Patient Active Problem List   Diagnosis Date Noted  . Genetic  testing 10/08/2015  . History of left breast cancer 09/28/2015  . Family history of breast cancer in sister 09/28/2015  . Family history of colon cancer 09/28/2015  . Breast cancer of upper-outer quadrant of right female breast ([place]) 09/09/2015    SALISBUR[place],DONNA 01/05/2016, 10:07 AM  [place], [place], [address] Phone: [phone]   Fax:  [phone]  Name: Gela Rooth MRN: [id number] Date of Birth: Jun 06, 1966

## 2016-01-05 NOTE — Telephone Encounter (Signed)
Faxed letter from Dr. Burr Medico to pt's benefits dept as per pt's request.   Informed pt of same info.

## 2016-01-05 NOTE — Patient Instructions (Signed)
Flexors Stick Stretch I    Lie on your back, stand or sit, dowel in palm of arm to be stretched. Other arm, holding dowel at side and behind body, pushes arm being stretched forward and upward until straight over head. Hold _5__ seconds. Repeat _5-10__ times per session. Do _2-3__ sessions per day.  Copyright  VHI. All rights reserved.  Inferior Capsule Stick Stretch I    Lie on your back, stand or sit, dowel in palm of arm to be stretched. Other arm, holding dowel in front of body, pushes outward and upward until arm being stretched is as high to the side as possible. Hold __5_ seconds. Repeat __5-10_ times per session. Do __2-3_ sessions per day.  Copyright  VHI. All rights reserved.

## 2016-01-05 NOTE — Progress Notes (Signed)
No show

## 2016-01-05 NOTE — Addendum Note (Signed)
Addended by: Jomarie Longs on: 01/05/2016 10:07 AM   Modules accepted: Orders

## 2016-01-05 NOTE — Therapy (Signed)
[place], [place], [address] Phone: [phone]   Fax:  [phone]  Physical Therapy Treatment  Patient Details  Name: Sandra James MRN: [id number] Date of Birth: [birth year] No Data Recorded  Encounter Date: 01/05/2016      PT End of Session - 01/05/16 0940    Visit Number 2   Number of Visits 4  to 8   Date for PT Re-Evaluation 01/22/16   PT Start Time 0857   PT Stop Time 0936   PT Time Calculation (min) 39 min   Activity Tolerance Patient tolerated treatment well   Behavior During Therapy [facility] for tasks assessed/performed      Past Medical History  Diagnosis Date  . MVP (mitral valve prolapse)   . Infection     UTI  . Anxiety   . Fibroid   . Cancer [facility])     left brast lumpectomy   . Breast cancer of upper-outer quadrant of right female breast ([place]) 09/09/2015  . Diabetes mellitus without complication ([place])   . Depression   . GERD (gastroesophageal reflux disease)   . Complication of anesthesia     slow to wake up- BP was low, fainted next day-  . Breast cancer ([place]) 09/07/15    right breast  . Allergy   . UTI (lower urinary tract infection)   . Neuromuscular disorder ([place])     sciatic nerve paion left side/leg    Past Surgical History  Procedure Laterality Date  . Abdominal hysterectomy    . Breast surgery  2004    left lumpectomy  . Oophorectomy Bilateral   . Mastectomy w/ sentinel node biopsy Bilateral 11/25/2015    Procedure: BILATERAL SKIN SPARING MASTECTOMIES WITH RIGHT SENTINEL LYMPH NODE BIOPSY;  Surgeon: Stark Klein, MD;  Location: [place];  Service: General;  Laterality: Bilateral;  . Breast reconstruction with placement of tissue expander and flex hd (acellular hydrated dermis) Bilateral 11/25/2015    Procedure: BILATERAL IMMEDIATE BREAST RECONSTRUCTION WITH PLACEMENT OF TISSUE EXPANDERS AND FLEX HD (ACELLULAR HYDRATED DERMIS);  Surgeon: Wallace Going, DO;  Location: [place];  Service: Plastics;  Laterality: Bilateral;  . Portacath placement Left 12/20/2015    Procedure: INSERTION PORT-A-CATH, left chest;  Surgeon: Stark Klein, MD;  Location: [place];  Service: General;  Laterality: Left;    There were no vitals filed for this visit.  Visit Diagnosis:  Stiffness of joint, shoulder region, right  Stiffness of joint, shoulder region, left  Pain in joint, shoulder region, right  Pain in joint, shoulder region, left      Subjective Assessment - 01/05/16 0858    Subjective Portacath has healed at this point.  Nothing else new.  Chip pack helped some with swelling at right underarm.   Currently in Pain? Yes   Pain Score 0-No pain  up to 4   Pain Location Axilla   Pain Orientation Right   Aggravating Factors  lying on right side   Pain Relieving Factors when the area is less indurated            [facility] PT Assessment - 01/05/16 0001    Observation/Other Assessments   Observations right axilla area looks much less indurated and with fewer skin folds today; cording at right axilla visible and palpable with arm in abduction   Skin Integrity skin at Portacath site looks good   AROM   Right Shoulder Flexion 103 Degrees   Right Shoulder  ABduction 78 Degrees   Left Shoulder Flexion 126 Degrees   Left Shoulder ABduction 80 Degrees   Left Shoulder Internal Rotation --  [facility]   Left Shoulder External Rotation 44 Degrees   Palpation   Palpation comment area at right axilla is softer than on previous visit, though has an area of firmness still                     [facility] Adult PT Treatment/Exercise - 01/05/16 0001    Shoulder Exercises: Supine   Other Supine Exercises dowel stretches for flexion and abduction, unilateral but for both left and right, 5 counts x 5 each   Manual Therapy   Manual Therapy Myofascial release;Soft tissue mobilization;Neural Stretch   Soft tissue mobilization at cording  in right axilla while stretching into abduction   Myofascial Release gentle pulling for release at pect muscle insertion area; gentle UE pulling left and right   Passive ROM left and right shoulder PROM to tolerance with gentle stretch for ER, abduction, and flexion   Neural Stretch right UE                PT Education - 01/05/16 0939    Education provided Yes   Education Details dowel stretches for unilateral shoulder flexion and abduction, for right and left   Person(s) Educated Patient   Methods Explanation;Demonstration;Handout   Comprehension Verbalized understanding;Returned demonstration              Breast Clinic Goals - 09/15/15 1046    Patient will be able to verbalize understanding of pertinent lymphedema risk reduction practices relevant to her diagnosis specifically related to skin care.   Time 1   Period Days   Status Achieved   Patient will be able to return demonstrate and/or verbalize understanding of the post-op home exercise program related to regaining shoulder range of motion.   Time 1   Period Days   Status Achieved   Patient will be able to verbalize understanding of the importance of attending the postoperative After Breast Cancer Class for further lymphedema risk reduction education and therapeutic exercise.   Time 1   Period Days   Status Achieved          Long Term Clinic Goals - 01/05/16 0944    CC Long Term Goal  #1   Title Patient will report at least 50% perceived decrease in swelling and discomfort at area of right axilla   Status On-going   CC Long Term Goal  #2   Title Patient will report at least 50% decrease in discomfort with moving arms   Status On-going   CC Long Term Goal  #3   Title bilateral shoulder flexion to at least 120 degrees for improved overhead reach   Status On-going   CC Long Term Goal  #4   Title bilateral shoulder abduction to at least 120 degrees for improved ADLs   Status On-going   CC Long Term Goal   #5   Title independent with HEP for shoulder ROM and self-care, including care of swelling at right axilla   Status On-going            Plan - 01/05/16 0940    Clinical Impression Statement Patient did well with session today, but PROM limited by pain.  Right axillary swelling has decreased a bit, with decreased induration.  Definite cording at right axilla when shoulder is in abduction.  Right shoulder active flexion and abduction were slightly  improved today as measured at beginning of session; left side was measured.  Patient reported feeling stretched out at end of session.   Pt will benefit from skilled therapeutic intervention in order to improve on the following deficits Decreased range of motion;Pain;Increased edema;Impaired UE functional use   Rehab Potential Excellent   Clinical Impairments Affecting Rehab Potential Portacath placed 12/21/15; has expanders in place so will have fills from time to time; will start chemo soon (01/06/16)   PT Frequency 2x / week   PT Duration 2 weeks   PT Treatment/Interventions Passive range of motion;Manual techniques;Patient/family education;Therapeutic exercise   PT Next Visit Plan Patient starts chemo tomorrow and doesn't want to schedule more visits until she sees how she does with it.  At that time, remeasure ROM and assess swelling, continue manual therapy for ROM and swelling.   PT Home Exercise Plan dowel exercises for shoulder ROM 2-3x/day   Consulted and Agree with Plan of Care Patient        Problem List Patient Active Problem List   Diagnosis Date Noted  . Genetic testing 10/08/2015  . History of left breast cancer 09/28/2015  . Family history of breast cancer in sister 09/28/2015  . Family history of colon cancer 09/28/2015  . Breast cancer of upper-outer quadrant of right female breast ([place]) 09/09/2015    [place] 01/05/2016, 9:46 AM  [place], [place], [address] Phone: [phone]   Fax:  [phone]  Name: Sandra James MRN: [id number] Date of Birth: [birth year]    Serafina Royals, PT 01/05/2016 9:46 AM

## 2016-01-06 ENCOUNTER — Other Ambulatory Visit: Payer: BLUE CROSS/BLUE SHIELD

## 2016-01-06 ENCOUNTER — Other Ambulatory Visit (HOSPITAL_BASED_OUTPATIENT_CLINIC_OR_DEPARTMENT_OTHER): Payer: BLUE CROSS/BLUE SHIELD

## 2016-01-06 ENCOUNTER — Encounter: Payer: Self-pay | Admitting: Hematology

## 2016-01-06 ENCOUNTER — Telehealth: Payer: Self-pay | Admitting: Hematology

## 2016-01-06 ENCOUNTER — Ambulatory Visit: Payer: BLUE CROSS/BLUE SHIELD | Admitting: Hematology

## 2016-01-06 ENCOUNTER — Ambulatory Visit (HOSPITAL_BASED_OUTPATIENT_CLINIC_OR_DEPARTMENT_OTHER): Payer: BLUE CROSS/BLUE SHIELD

## 2016-01-06 ENCOUNTER — Other Ambulatory Visit: Payer: Self-pay | Admitting: *Deleted

## 2016-01-06 ENCOUNTER — Ambulatory Visit (HOSPITAL_BASED_OUTPATIENT_CLINIC_OR_DEPARTMENT_OTHER): Payer: BLUE CROSS/BLUE SHIELD | Admitting: Hematology

## 2016-01-06 ENCOUNTER — Encounter: Payer: Self-pay | Admitting: *Deleted

## 2016-01-06 VITALS — BP 121/69 | HR 99 | Temp 98.6°F | Resp 18 | Ht 66.0 in | Wt 155.8 lb

## 2016-01-06 VITALS — BP 116/75 | HR 92 | Temp 98.5°F | Resp 18

## 2016-01-06 DIAGNOSIS — Z5189 Encounter for other specified aftercare: Secondary | ICD-10-CM

## 2016-01-06 DIAGNOSIS — C50411 Malignant neoplasm of upper-outer quadrant of right female breast: Secondary | ICD-10-CM | POA: Diagnosis not present

## 2016-01-06 DIAGNOSIS — Z5112 Encounter for antineoplastic immunotherapy: Secondary | ICD-10-CM | POA: Diagnosis not present

## 2016-01-06 DIAGNOSIS — E119 Type 2 diabetes mellitus without complications: Secondary | ICD-10-CM | POA: Diagnosis not present

## 2016-01-06 DIAGNOSIS — Z5111 Encounter for antineoplastic chemotherapy: Secondary | ICD-10-CM | POA: Diagnosis not present

## 2016-01-06 DIAGNOSIS — Z853 Personal history of malignant neoplasm of breast: Secondary | ICD-10-CM

## 2016-01-06 LAB — COMPREHENSIVE METABOLIC PANEL
ALT: 15 U/L (ref 0–55)
ANION GAP: 12 meq/L — AB (ref 3–11)
AST: 11 U/L (ref 5–34)
Albumin: 4 g/dL (ref 3.5–5.0)
Alkaline Phosphatase: 87 U/L (ref 40–150)
BUN: 12.7 mg/dL (ref 7.0–26.0)
CHLORIDE: 105 meq/L (ref 98–109)
CO2: 21 meq/L — AB (ref 22–29)
CREATININE: 0.8 mg/dL (ref 0.6–1.1)
Calcium: 9.9 mg/dL (ref 8.4–10.4)
Glucose: 172 mg/dl — ABNORMAL HIGH (ref 70–140)
POTASSIUM: 3.7 meq/L (ref 3.5–5.1)
Sodium: 139 mEq/L (ref 136–145)
Total Bilirubin: 0.48 mg/dL (ref 0.20–1.20)
Total Protein: 8.2 g/dL (ref 6.4–8.3)

## 2016-01-06 LAB — CBC WITH DIFFERENTIAL/PLATELET
BASO%: 0.4 % (ref 0.0–2.0)
Basophils Absolute: 0 10*3/uL (ref 0.0–0.1)
EOS%: 0 % (ref 0.0–7.0)
Eosinophils Absolute: 0 10*3/uL (ref 0.0–0.5)
HCT: 39.2 % (ref 34.8–46.6)
HGB: 12.9 g/dL (ref 11.6–15.9)
LYMPH%: 8.3 % — AB (ref 14.0–49.7)
MCH: 29.6 pg (ref 25.1–34.0)
MCHC: 32.8 g/dL (ref 31.5–36.0)
MCV: 90.2 fL (ref 79.5–101.0)
MONO#: 0.2 10*3/uL (ref 0.1–0.9)
MONO%: 1.4 % (ref 0.0–14.0)
NEUT#: 12 10*3/uL — ABNORMAL HIGH (ref 1.5–6.5)
NEUT%: 89.9 % — AB (ref 38.4–76.8)
PLATELETS: 426 10*3/uL — AB (ref 145–400)
RBC: 4.35 10*6/uL (ref 3.70–5.45)
RDW: 14.1 % (ref 11.2–14.5)
WBC: 13.4 10*3/uL — ABNORMAL HIGH (ref 3.9–10.3)
lymph#: 1.1 10*3/uL (ref 0.9–3.3)

## 2016-01-06 MED ORDER — DIPHENHYDRAMINE HCL 25 MG PO CAPS
50.0000 mg | ORAL_CAPSULE | Freq: Once | ORAL | Status: AC
  Administered 2016-01-06: 50 mg via ORAL

## 2016-01-06 MED ORDER — SODIUM CHLORIDE 0.9 % IJ SOLN
10.0000 mL | INTRAMUSCULAR | Status: DC | PRN
  Administered 2016-01-06: 10 mL
  Filled 2016-01-06: qty 10

## 2016-01-06 MED ORDER — HEPARIN SOD (PORK) LOCK FLUSH 100 UNIT/ML IV SOLN
500.0000 [IU] | Freq: Once | INTRAVENOUS | Status: AC | PRN
  Administered 2016-01-06: 500 [IU]
  Filled 2016-01-06: qty 5

## 2016-01-06 MED ORDER — SODIUM CHLORIDE 0.9 % IV SOLN
8.0000 mg/kg | Freq: Once | INTRAVENOUS | Status: AC
  Administered 2016-01-06: 546 mg via INTRAVENOUS
  Filled 2016-01-06: qty 26

## 2016-01-06 MED ORDER — SODIUM CHLORIDE 0.9 % IV SOLN
Freq: Once | INTRAVENOUS | Status: AC
  Administered 2016-01-06: 10:00:00 via INTRAVENOUS

## 2016-01-06 MED ORDER — PEGFILGRASTIM 6 MG/0.6ML ~~LOC~~ PSKT
6.0000 mg | PREFILLED_SYRINGE | Freq: Once | SUBCUTANEOUS | Status: AC
  Administered 2016-01-06: 6 mg via SUBCUTANEOUS
  Filled 2016-01-06: qty 0.6

## 2016-01-06 MED ORDER — DIPHENHYDRAMINE HCL 25 MG PO CAPS
ORAL_CAPSULE | ORAL | Status: AC
  Filled 2016-01-06: qty 2

## 2016-01-06 MED ORDER — ACETAMINOPHEN 325 MG PO TABS
650.0000 mg | ORAL_TABLET | Freq: Once | ORAL | Status: AC
  Administered 2016-01-06: 650 mg via ORAL

## 2016-01-06 MED ORDER — ACETAMINOPHEN 325 MG PO TABS
ORAL_TABLET | ORAL | Status: AC
  Filled 2016-01-06: qty 2

## 2016-01-06 MED ORDER — DOCETAXEL CHEMO INJECTION 160 MG/16ML
75.0000 mg/m2 | Freq: Once | INTRAVENOUS | Status: AC
  Administered 2016-01-06: 130 mg via INTRAVENOUS
  Filled 2016-01-06: qty 13

## 2016-01-06 MED ORDER — PALONOSETRON HCL INJECTION 0.25 MG/5ML
INTRAVENOUS | Status: AC
  Filled 2016-01-06: qty 5

## 2016-01-06 MED ORDER — CARBOPLATIN CHEMO INJECTION 600 MG/60ML
710.0000 mg | Freq: Once | INTRAVENOUS | Status: AC
  Administered 2016-01-06: 710 mg via INTRAVENOUS
  Filled 2016-01-06: qty 71

## 2016-01-06 MED ORDER — PERTUZUMAB CHEMO INJECTION 420 MG/14ML
840.0000 mg | Freq: Once | INTRAVENOUS | Status: AC
  Administered 2016-01-06: 840 mg via INTRAVENOUS
  Filled 2016-01-06: qty 28

## 2016-01-06 MED ORDER — PALONOSETRON HCL INJECTION 0.25 MG/5ML
0.2500 mg | Freq: Once | INTRAVENOUS | Status: AC
  Administered 2016-01-06: 0.25 mg via INTRAVENOUS

## 2016-01-06 MED ORDER — SODIUM CHLORIDE 0.9 % IV SOLN
Freq: Once | INTRAVENOUS | Status: AC
  Administered 2016-01-06: 15:00:00 via INTRAVENOUS
  Filled 2016-01-06: qty 5

## 2016-01-06 MED ORDER — MIRTAZAPINE 30 MG PO TABS: 30.0000 mg | ORAL_TABLET | Freq: Every day | ORAL | Status: DC

## 2016-01-06 MED FILL — MIRTAZAPINE 30 MG TABLET: 30 | 30 days supply | Qty: 30 | Fill #0

## 2016-01-06 NOTE — Progress Notes (Signed)
Met with patient today in treatment. Patient brought back applications for [employer] and [employer]. Patient emailed me supporting documents but needed additional documentation(2015 W-2 and tax return). Patient will also send me letter of support from friend she lives with as rent statement. Waiting on additional documents to submit applications. Also left forms for Dr.Feng to sign and return to me. Faxed [employer] application and fax was received ok per confirmation sheet.  Applied and received approval for $10,000 to [employer] for Neulasta. Applied and received approval for copay assistance through [employer] with the maximum amount of $5,000 from 07/10/15-01/05/17.I emailed copy of award letter and POE to billing. Patient will bring card for me to make copy when she receives it. Patient has my card for any additional financial questions or concerns.

## 2016-01-06 NOTE — Progress Notes (Unsigned)
Patient developed cramping during the last 20 minutes of 1st time Perjeta infusion.  She rated it as a 4 but completed dose.

## 2016-01-06 NOTE — Telephone Encounter (Signed)
per pof to sch pt appt-gave pt copy of avs °

## 2016-01-06 NOTE — Progress Notes (Signed)
[place]  [phone] Fax:([phone]  Clinic follow Up Note   Patient Care Team: Sandra Cowman, MD as PCP - General (Family Medicine) Sandra Klein, MD as Consulting Physician (General Surgery) Sandra Merle, MD as Consulting Physician (Hematology) Sandra Koh, MD as Consulting Physician (Radiation Oncology) Sandra Kaufmann, RN as Registered Nurse Sandra Germany, RN as Registered Nurse Sandra Cheese, NP as Nurse Practitioner (Nurse Practitioner) 01/06/2016  CHIEF COMPLAINTS:  Follow up right breast cancer  Oncology History   Breast cancer [place])   Staging form: Breast, AJCC 7th Edition     Pathologic stage from 11/25/2015: Stage IIA (T1c, N1a, cM0) - Unsigned     Pathologic stage from 11/25/2015: Stage IIA (T1c, N1a, cM0) - Unsigned Breast cancer of upper-outer quadrant of right female breast [place])   Staging form: Breast, AJCC 7th Edition     Clinical stage from 09/15/2015: Stage IA (T1b, N0, M0) - Signed by Sandra Merle, MD on 12/16/2015     Pathologic stage from 11/25/2015: Stage IIA (T1c, N1a, cM0) - Signed by Sandra Merle, MD on 12/16/2015         Breast cancer of upper-outer quadrant of right female breast ([place])   09/07/2015 Mammogram Diagnostic mammogram and the ultrasound of the right breast showed a 0.8 cm lobulated mass in the right breast 11 to 12:00 position 9 cm from the nipple. No other lesions or adenopathy.   09/08/2015 Initial Diagnosis Breast cancer of upper-outer quadrant of right female breast ([place])   09/08/2015 Initial Biopsy Right breast mass at the upper outer quadrant core needle biopsy showed invasive ductal carcinoma, grade 2-3, ductal carcinoma in situ.   09/08/2015 Receptors her2 ER 100% positive, PR 100% positive, Ki-67 60%, HER-2 positive with copy # 4.25 and ratio 2.58   11/25/2015 Surgery Right breast mastectomy and sentinel lymph node biopsy   11/25/2015 Pathology Results Right breast invasive ductal carcinoma, grade 2, 2.0  cm, DCIS, intermediate grade, (+) LVI, margins were negative, 1 out of 3 lymph nodes were negative. Left breast ostectomy was negative for malignancy.    HISTORY OF PRESENTING ILLNESS:  Sandra James 50 y.o. female with past medical history of stage I left breast cancer, is here because of newly diagnosed right breast cancer. She presents to our multidisciplinary breast clinic by herself.  The right breast cancer was discovered by screening mammogram. She did not have any palpable mass, or any constitutional symptoms. She was diagnosed with stage I left breast cancer at age of 53, she had lumpectomy, radiation, and adjuvant chemotherapy and 5 years of tamoxifen. She was treated by Dr. Louann James in [place]. She moved to [place] about year ago due to job change. She has been very compliant with annual screening mammogram.  She had hysterectomy and bilateral oophorectomy and sphincterectomy 5 years ago for heavy bleeding.. She has been having hot flashes since then, moderate, but manageable. She is single, lives alone, works for 2 to [place] has to go up children who live in [place].  CURRENT THERAPY: Adjuvant chemotherapy with docetaxel, carboplatin, Herceptin and pejeta (TCHP) every 3 weeks started on 01/06/2016  [name] returns for follow-up and initiating adjuvant chemotherapy. She still has issue with sleep, and she doubled the Remeron dose, which helped some. Her appetite has quite improved with Remeron. She has constipation sometime. No other new complaints. She is somewhat anxious to start chemotherapy. She had a port placed last week.   MEDICAL HISTORY:  Past Medical History  Diagnosis Date  . MVP (mitral valve prolapse)   . Infection     UTI  . Anxiety   . Fibroid   . Cancer [facility])     left brast lumpectomy   . Breast cancer of upper-outer quadrant of right female breast ([place]) 09/09/2015  . Diabetes mellitus without complication ([place])   . Depression   .  GERD (gastroesophageal reflux disease)   . Complication of anesthesia     slow to wake up- BP was low, fainted next day-  . Breast cancer ([place]) 09/07/15    right breast  . Allergy   . UTI (lower urinary tract infection)   . Neuromuscular disorder ([place])     sciatic nerve paion left side/leg    SURGICAL HISTORY: Past Surgical History  Procedure Laterality Date  . Abdominal hysterectomy    . Breast surgery  2004    left lumpectomy  . Oophorectomy Bilateral   . Mastectomy w/ sentinel node biopsy Bilateral 11/25/2015    Procedure: BILATERAL SKIN SPARING MASTECTOMIES WITH RIGHT SENTINEL LYMPH NODE BIOPSY;  Surgeon: Sandra Klein, MD;  Location: [facility];  Service: General;  Laterality: Bilateral;  . Breast reconstruction with placement of tissue expander and flex hd (acellular hydrated dermis) Bilateral 11/25/2015    Procedure: BILATERAL IMMEDIATE BREAST RECONSTRUCTION WITH PLACEMENT OF TISSUE EXPANDERS AND FLEX HD (ACELLULAR HYDRATED DERMIS);  Surgeon: Wallace Going, DO;  Location: [facility];  Service: Plastics;  Laterality: Bilateral;  . Portacath placement Left 12/20/2015    Procedure: INSERTION PORT-A-CATH, left chest;  Surgeon: Sandra Klein, MD;  Location: [facility];  Service: General;  Laterality: Left;    SOCIAL HISTORY: Social History   Social History  . Marital Status: Single     Spouse Name: N/A  . Number of Children: 2 children, 75 daughter and 61 yo son    . Years of Education: N/A   Occupational History  . She is a Barista rep   Social History Main Topics  . Smoking status: Never Smoker   . Smokeless tobacco: Never Used  . Alcohol Use: Yes     Comment: 2-3/wk  . Drug Use: No  . Sexual Activity: Yes    Birth Control/ Protection: Surgical   Other Topics Concern  . Not on file   Social History Narrative    FAMILY HISTORY: Family History  Problem Relation Age of Onset  . Hypertension Mother   . Stroke Mother   . Alzheimer's  disease Mother   . Heart disease Father   . Stroke Father   . Heart attack Father   . Breast cancer Sister 29    double mastectomy  . Other Sister     "stomach tumor that wrapped around reproductive organs"; dx. 12s; required partial hysterectomy  . Alzheimer's disease Maternal Grandmother   . Alzheimer's disease Paternal Grandmother   . Other Other     had to have lymph nodes removed and radiation; dx. 25s  . Cancer Maternal Aunt     unspecified type; dx. <50  . Colon cancer Maternal Uncle     dx. 68s  . Breast cancer Cousin     dx. 83s    ALLERGIES:  is allergic to lisinopril; oxycodone; and percocet.  MEDICATIONS:  Current Outpatient Prescriptions  Medication Sig Dispense Refill  . dexamethasone (DECADRON) 4 MG tablet Take 2 tablets (8 mg total) by mouth 2 (two) times daily. Start the day before Taxotere. Then again the  day after chemo for 3 days. 30 tablet 1  . diazepam (VALIUM) 2 MG tablet Take 2 mg by mouth every 6 (six) hours as needed for muscle spasms. Reported on 12/22/2015    . HYDROcodone-acetaminophen (NORCO/VICODIN) 5-325 MG tablet Take 1 tablet by mouth every 6 (six) hours as needed for moderate pain. 30 tablet 0  . lidocaine-prilocaine (EMLA) cream Apply to affected area once 30 g 3  . metFORMIN (GLUCOPHAGE) 500 MG tablet Take 500 mg by mouth 2 (two) times daily with a meal. Reported on 12/22/2015  0  . omeprazole (PRILOSEC) 20 MG capsule Take 1 capsule (20 mg total) by mouth daily. 14 capsule 0  . ondansetron (ZOFRAN) 8 MG tablet Take 1 tablet (8 mg total) by mouth 2 (two) times daily. Start the day after chemo for 3 days. Then take as needed for nausea or vomiting. 30 tablet 1  . polyethylene glycol (MIRALAX / GLYCOLAX) packet Take 17 g by mouth daily as needed for mild constipation. 14 each 0  . prochlorperazine (COMPAZINE) 10 MG tablet Take 1 tablet (10 mg total) by mouth every 6 (six) hours as needed (Nausea or vomiting). 30 tablet 1  . mirtazapine (REMERON) 30 MG  tablet Take 1 tablet (30 mg total) by mouth at bedtime. 30 tablet 3   No current facility-administered medications for this visit.   Facility-Administered Medications Ordered in Other Visits  Medication Dose Route Frequency Provider Last Rate Last Dose  . sodium chloride 0.9 % injection 10 mL  10 mL Intracatheter PRN Sandra Merle, MD   10 mL at 01/06/16 1730    REVIEW OF SYSTEMS:   Constitutional: Denies fevers, chills or abnormal night sweats Eyes: Denies blurriness of vision, double vision or watery eyes Ears, nose, mouth, throat, and face: Denies mucositis or sore throat Respiratory: Denies cough, dyspnea or wheezes Cardiovascular: Denies palpitation, chest discomfort or lower extremity swelling Gastrointestinal:  Denies nausea, heartburn or change in bowel habits Skin: Denies abnormal skin rashes Lymphatics: Denies new lymphadenopathy or easy bruising Neurological:Denies numbness, tingling or new weaknesses Behavioral/Psych: Mood is stable, no new changes  All other systems were reviewed with the patient and are negative.  PHYSICAL EXAMINATION: ECOG PERFORMANCE STATUS: 1  Filed Vitals:   01/06/16 0838  BP: 121/69  Pulse: 99  Temp: 98.6 F (37 C)  Resp: 18   Filed Weights   01/06/16 0838  Weight: 155 lb 12.8 oz (70.67 kg)    GENERAL:alert, no distress and comfortable SKIN: skin color, texture, turgor are normal, no rashes or significant lesions EYES: normal, conjunctiva are pink and non-injected, sclera clear OROPHARYNX:no exudate, no erythema and lips, buccal mucosa, and tongue normal  NECK: supple, thyroid normal size, non-tender, without nodularity LYMPH:  no palpable lymphadenopathy in the cervical, axillary or inguinal LUNGS: clear to auscultation and percussion with normal breathing effort HEART: regular rate & rhythm and no murmurs and no lower extremity edema ABDOMEN:abdomen soft, non-tender and normal bowel sounds Musculoskeletal:no cyanosis of digits and no  clubbing  PSYCH: alert & oriented x 3 with fluent speech NEURO: no focal motor/sensory deficits Breasts: s/p bilateral mastectomy. Surgical incision sites are clean, no surrounding skin erythema or discharge. There is a mild discharge at the J-tube site, no surrounding skin erythema.   LABORATORY DATA:  I have reviewed the data as listed CBC Latest Ref Rng 01/06/2016 12/20/2015 11/12/2015  WBC 3.9 - 10.3 10e3/uL 13.4(H) 8.1 7.4  Hemoglobin 11.6 - 15.9 g/dL 12.9 12.1 13.8  Hematocrit 34.8 -  46.6 % 39.2 37.5 41.2  Platelets 145 - 400 10e3/uL 426(H) 406(H) 340    CMP Latest Ref Rng 01/06/2016 12/20/2015 11/12/2015  Glucose 70 - 140 mg/dl 172(H) 107(H) 104  BUN 7.0 - 26.0 mg/dL 12.7 <5(L) 7.7  Creatinine 0.6 - 1.1 mg/dL 0.8 0.68 0.8  Sodium 136 - 145 mEq/L 139 141 141  Potassium 3.5 - 5.1 mEq/L 3.7 4.5 4.3  Chloride 101 - 111 mmol/L - 105 -  CO2 22 - 29 mEq/L 21(L) 26 25  Calcium 8.4 - 10.4 mg/dL 9.9 9.1 9.6  Total Protein 6.4 - 8.3 g/dL 8.2 - 7.8  Total Bilirubin 0.20 - 1.20 mg/dL 0.48 - 0.89  Alkaline Phos 40 - 150 U/L 87 - 79  AST 5 - 34 U/L 11 - 17  ALT 0 - 55 U/L 15 - 17     Pathology report Diagnosis 11/25/2015 1. Breast, simple mastectomy, right - INVASIVE DUCTAL CARCINOMA, GRADE 2/3, SPANNING 2.0 CM. - DUCTAL CARCINOMA IN SITU, INTERMEDIATE GRADE. - LYMPHOVASCULAR INVASION IS IDENTIFIED. - LOBULAR NEOPLASIA (ATYPICAL LOBULAR HYPERPLASIA). - THE SURGICAL RESECTION MARGINS ARE NEGATIVE FOR CARCINOMA. - SEE ONCOLOGY TABLE BELOW. 2. Lymph node, sentinel, biopsy, right axillary #1 - METASTATIC CARCINOMA IN 1 OF 1 LYMPH NODE (1/1). 3. Lymph node, sentinel, biopsy, right axillary #2 - THERE IS NO EVIDENCE OF CARCINOMA IN 1 OF 1 LYMPH NODE (0/1). 4. Lymph node, sentinel, biopsy, right axillary #3 - THERE IS NO EVIDENCE OF CARCINOMA IN 1 OF 1 LYMPH NODE (0/1). 5. Breast, simple mastectomy, left - BENIGN BREAST PARENCHYMA WITH DENSE STROMAL FIBROSIS. - FIBROADENOMA. - THERE IS NO  EVIDENCE OF MALIGNANCY. 6. Breast, excision, left, additional lateral margin - BENIGN FIBROADIPOSE TISSUE. - THERE IS NO EVIDENCE OF MALIGNANCY. - SEE COMMENT. Microscopic Comment 1. BREAST, INVASIVE TUMOR, WITH LYMPH NODES PRESENT Specimen, including laterality and lymph node sampling (sentinel, non-sentinel): Right breast and right axillary lymph nodes Procedure: Bilateral mastectomy and multiple right axillary lymph node resections Histologic type: Ductal Grade: 2 Tubule formation: 2 1 of 4 FINAL for Gren, Ziyah ([id number]) Microscopic Comment(continued) Nuclear pleomorphism: 2 Mitotic: 2 Tumor size (gross measurement): 2.0 cm Margins: Negative for carcinoma Invasive, distance to closest margin: 1.8 cm to the posterior margin In-situ, distance to closest margin: 1.8 cm to the posterior margin Lymphovascular invasion: Present Ductal carcinoma in situ: Present Grade: Intermediate grade Extensive intraductal component: Not identified Lobular neoplasia: Present, atypical lobular hyperplasia Tumor focality: Unifocal Treatment effect: Not identified Extent of tumor: Confined to breast parenchyma Lymph nodes: Examined: 3 Sentinel 0 Non-sentinel 3 Total Lymph nodes with metastasis: 1 (macrometastasis) - no extracapsular extension is identified. Breast prognostic profile: [id number] Estrogen receptor: 100%, strong staining intensity Progesterone receptor: 100%, strong staining intensity Her 2 neu: Amplification was detected. The ratio was 2.58 Ki-67: 60% TNM: pT1c, pN1a Non-neoplastic breast: No significant findings. 6. The surgical resection margin(s) of the specimen were inked and microscopically evaluated. Enid Cutter MD Pathologist, Electronic Signature (Case signed 11/29/2015)   RADIOGRAPHIC STUDIES: I have personally reviewed the radiological images as listed and agreed with the findings in the report.  Bone scan 12/28/2014 IMPRESSION: Foci of increased  tracer localization at the sternum, unable to exclude metastases.  Uptake at inferior cervical spine could potentially be related to degenerative disc disease changes present at C5-C6 on CT.  Questionable abnormal increased tracer localization at the posterior LEFT ninth and tenth ribs.  CT chest, abdomen and pelvis 12/28/2014 IMPRESSION: 1. 2 cm enhancing lesion in segment 5 of  the liver is indeterminate. It could reflect FNH, vascular shunt and less likely metastasis. Recommend MRI abdomen without and with contrast using Eovist for further evaluation. No other hepatic lesions. 2. No findings for metastatic disease involving the chest. There is a focal area of airspace disease in the superior segment of the right lower lobe which could be a developing or resolving infiltrate. Recommend correlation with clinical symptoms.  ECHO 12/21/2015 Study Conclusions  - Left ventricle: The cavity size was normal. Wall thickness was increased in a pattern of mild LVH. Systolic function was normal. The estimated ejection fraction was in the range of 55% to 60%. Wall motion was normal; there were no regional wall motion abnormalities. Global longitudinal strain: -18.5% There was no evidence of elevated ventricular filling pressure by Doppler parameters.  ASSESSMENT & PLAN:  50 year old African-American female, surgical postmenopausal, history of stage I left breast cancer, with newly diagnosed right stage I breast cancer.  1. Right breast invasive ductal carcinoma, pT1cN1aM0, stage IIB, grade 2, ER100%+/PR100%+/HER2+ -I previously reviewed her surgical pathology results with patient in great details. She has 1 out of 3 sentinel lymph nodes positive, her pathological stage is more advanced than her initial clinical stage.  -We reviewed the natural history of triple positive breast cancer. HER-2 positive tumors are more progressive, with higher risk of recurrence -I reviewed her restaging CT  scan and bone scan images with her in person. I discussed the bone scan findings with radiologist Dr. Thornton Papas. The mild hypermetabolic uptake in the sternum and left ninth and 10th rib have no significant corresponding change on the CT scan, except for small lytic spot in the sternum. They're not amenable for biopsy, PET scan may not have much additional value, the suspicion for metastases from breast cancer is low. I recommend follow-up. The 2 cm lesion in the liver are likely benign, I recommend to obtain a liver MRI for further evaluation. -Given her high risk of cancer recurrence, I recommend adjuvant chemotherapy with docetaxel, carboplatin, Herceptin and Perjeta (TCHP), every 3 weeks for 6 cycles,  followed by maintenance Herceptin to complete a 1 year therapy. She previously received Adriamycin, will not be able to tolerate more adrimycin. -The goal of adjuvant chemotherapy is curative. -Due to her previous exposure to Adriamycin, she will follow-up with cardiologist. Repeat echo is normal. -Given her ER/PR positive disease, and post menopausal status, I recommend her to take aromatase inhibitor for 10 years. However she could not tolerate letrozole which was tried before surgery. She previously tolerated tamoxifen well, I recommend her start tamoxifen after she completes radiation. -Lab reviewed, adequate for treatment, she will start her first cycle chemotherapy today. -She'll continue physical therapy  2. Genetics -Due to her young age and recurrent breast cancer, she was referred to see a genetic counselor in our cancer center. -Her genetic test was negative  3. DM -she will continue follow-up with her primary care physician -She is on metformin -We discussed that dexamethasone which will be given before and after chemotherapy, we'll likely interfere her blood glucose. She knows to monitor closely.  4. Bone health -She is postmenopausal by surgery -I encouraged her to take calcium and  vitamin D -I'll obtain a baseline bone density scan   Plan for today  -I had extensive discussion about her staging scan findings. -Liver MRI with and without contrast  -First cycle TCHP today with onpro  -I refilled her mirtazapine, and increase her dose to 30 mg -RTC in one week for toxicity  check up  All questions were answered. The patient knows to call the clinic with any problems, questions or concerns. I spent 30 minutes counseling the patient face to face. The total time spent in the appointment was 40 minutes and more than 50% was on counseling.     Sandra Merle, MD 01/06/2016 9:10 PM

## 2016-01-06 NOTE — Progress Notes (Signed)
[facility] Psychosocial Distress Screening Clinical Social Work  Clinical Social Work was referred by distress screening protocol.  The patient scored a 5 on the Psychosocial Distress Thermometer which indicates moderate distress. Clinical Social Worker met with pt at first chemo to assess for distress and other psychosocial needs. Pt reports to have met with FC, Shauna and applied for the [employer]. Pt eager for some financial assistance and CSW reviewed options for assistance and how CSW team could assist. CSW reviewed Pt and Family Support team and resources there for support. Pt provided with calendar and support group information. Pt may need help with transportation on chemo days and is aware of how to contact [facility], she may be open to SCAT as well. Pt appreciated check in and support. She agrees to work on Immunologist documents for additional grants and will reach out to CSW at a later date for further assistance.   ONCBCN DISTRESS SCREENING 12/27/2015  Screening Type Initial Screening  Distress experienced in past week (1-10) 5  Practical problem type Work/school  Emotional problem type Nervousness/Anxiety;Adjusting to illness  Information Concerns Type   Physician notified of physical symptoms Yes  Referral to clinical psychology   Referral to clinical social work Yes  Referral to dietition   Referral to financial advocate   Referral to support programs   Referral to palliative care     Clinical Social Worker follow up needed: Yes.    If yes, follow up plan: See above Loren Racer, [place] Worker [place]  [facility] Phone: [phone] Fax: [phone]

## 2016-01-06 NOTE — Addendum Note (Signed)
Encounter addended by: Doreen Beam, RN on: 01/06/2016 12:21 PM<BR>     Documentation filed: Charges VN

## 2016-01-06 NOTE — Progress Notes (Signed)
I faxed form for patient and gave her copy for her records

## 2016-01-06 NOTE — Telephone Encounter (Signed)
per pof pt req to add flush-added-pt to get updated sch b4 leaving trmt room today

## 2016-01-06 NOTE — Progress Notes (Signed)
[place] fmla form placed for dr. Burr Medico to sign

## 2016-01-07 ENCOUNTER — Encounter: Payer: Self-pay | Admitting: Hematology

## 2016-01-07 NOTE — Progress Notes (Signed)
Patient came in today to bring additional info needed to submit applications. Emailed completed application to [place]. [place] application to [place] to complete. Faxed completed application to the [facility].

## 2016-01-08 ENCOUNTER — Ambulatory Visit: Payer: BLUE CROSS/BLUE SHIELD

## 2016-01-11 ENCOUNTER — Telehealth: Payer: Self-pay | Admitting: Hematology

## 2016-01-11 ENCOUNTER — Other Ambulatory Visit: Payer: Self-pay | Admitting: Nurse Practitioner

## 2016-01-11 ENCOUNTER — Telehealth: Payer: Self-pay | Admitting: *Deleted

## 2016-01-11 ENCOUNTER — Other Ambulatory Visit: Payer: Self-pay | Admitting: *Deleted

## 2016-01-11 ENCOUNTER — Encounter: Payer: BLUE CROSS/BLUE SHIELD | Admitting: Cardiovascular Disease

## 2016-01-11 NOTE — Telephone Encounter (Signed)
"  I received chemotherapy 01-06-2016.  Was told someone would call to reach out to me and no one has.  Having stomach pain since yesterday, no one has reached out to me."  With further assessment she is able to eat and drink experiencing nausea, no emesis.  Using compazine and zofran anti-emetics.  Diarrhea stools yesterday "too numerable to count".  Feels impending movement at this time stating it will be diarrhea.  "Stomach pain, cramping, just hurts everywhere that goes along with an upset stomach.  Have not tried anything OTC.  No abdominal swelling or bloating."  Return number [phone].   [facility] or follow up offered.  "Roommate is not here.  Call me and I'll let you know.  Yesterday I thought I might have to go to the hospital but felt so bad I couldn't drive."

## 2016-01-11 NOTE — Telephone Encounter (Signed)
Spoke to patient and she confirmed appointment per 1/31 pof for 2/3 with Lattie Haw.

## 2016-01-11 NOTE — Telephone Encounter (Signed)
Called pt & she states that she does not have any fever & no diarrhea today & doesn't think she needs to come in.  She was concerned that she didn't receive a call back after chemo & this was apologized for.  Informed to call if she ever has any concerns, questions, or side effects to report.  Requested that she get some imodium to have at home if she needs it again.  She expressed understanding.  She doesn't have a 1 wk chemo f/u appt & POF sent to schedulers to get this scheduled.

## 2016-01-14 ENCOUNTER — Other Ambulatory Visit: Payer: Self-pay | Admitting: *Deleted

## 2016-01-14 ENCOUNTER — Ambulatory Visit (HOSPITAL_BASED_OUTPATIENT_CLINIC_OR_DEPARTMENT_OTHER): Payer: BLUE CROSS/BLUE SHIELD | Admitting: Nurse Practitioner

## 2016-01-14 ENCOUNTER — Other Ambulatory Visit: Payer: Self-pay | Admitting: Nurse Practitioner

## 2016-01-14 ENCOUNTER — Ambulatory Visit (HOSPITAL_BASED_OUTPATIENT_CLINIC_OR_DEPARTMENT_OTHER): Payer: BLUE CROSS/BLUE SHIELD

## 2016-01-14 VITALS — BP 116/77 | HR 96 | Temp 98.7°F | Resp 18 | Ht 66.0 in | Wt 152.8 lb

## 2016-01-14 DIAGNOSIS — K1379 Other lesions of oral mucosa: Secondary | ICD-10-CM | POA: Diagnosis not present

## 2016-01-14 DIAGNOSIS — Z853 Personal history of malignant neoplasm of breast: Secondary | ICD-10-CM

## 2016-01-14 DIAGNOSIS — Z95828 Presence of other vascular implants and grafts: Secondary | ICD-10-CM

## 2016-01-14 DIAGNOSIS — C50411 Malignant neoplasm of upper-outer quadrant of right female breast: Secondary | ICD-10-CM

## 2016-01-14 DIAGNOSIS — R300 Dysuria: Secondary | ICD-10-CM | POA: Diagnosis not present

## 2016-01-14 LAB — COMPREHENSIVE METABOLIC PANEL
ALBUMIN: 4.2 g/dL (ref 3.5–5.0)
ALK PHOS: 109 U/L (ref 40–150)
ALT: 35 U/L (ref 0–55)
ANION GAP: 12 meq/L — AB (ref 3–11)
AST: 20 U/L (ref 5–34)
BUN: 4.8 mg/dL — ABNORMAL LOW (ref 7.0–26.0)
CO2: 26 mEq/L (ref 22–29)
Calcium: 9.2 mg/dL (ref 8.4–10.4)
Chloride: 102 mEq/L (ref 98–109)
Creatinine: 0.8 mg/dL (ref 0.6–1.1)
Glucose: 91 mg/dl (ref 70–140)
POTASSIUM: 3.2 meq/L — AB (ref 3.5–5.1)
Sodium: 140 mEq/L (ref 136–145)
TOTAL PROTEIN: 7.6 g/dL (ref 6.4–8.3)

## 2016-01-14 LAB — CBC WITH DIFFERENTIAL/PLATELET
BASO%: 0.1 % (ref 0.0–2.0)
BASOS ABS: 0 10*3/uL (ref 0.0–0.1)
EOS%: 0.1 % (ref 0.0–7.0)
Eosinophils Absolute: 0 10*3/uL (ref 0.0–0.5)
HEMATOCRIT: 38.1 % (ref 34.8–46.6)
HEMOGLOBIN: 12.2 g/dL (ref 11.6–15.9)
LYMPH#: 3 10*3/uL (ref 0.9–3.3)
LYMPH%: 9.2 % — ABNORMAL LOW (ref 14.0–49.7)
MCH: 29.1 pg (ref 25.1–34.0)
MCHC: 32 g/dL (ref 31.5–36.0)
MCV: 90.9 fL (ref 79.5–101.0)
MONO#: 3.3 10*3/uL — ABNORMAL HIGH (ref 0.1–0.9)
MONO%: 10.1 % (ref 0.0–14.0)
NEUT#: 26 10*3/uL — ABNORMAL HIGH (ref 1.5–6.5)
NEUT%: 80.5 % — ABNORMAL HIGH (ref 38.4–76.8)
Platelets: 284 10*3/uL (ref 145–400)
RBC: 4.19 10*6/uL (ref 3.70–5.45)
RDW: 13.4 % (ref 11.2–14.5)
WBC: 32.3 10*3/uL — ABNORMAL HIGH (ref 3.9–10.3)

## 2016-01-14 LAB — URINALYSIS, MICROSCOPIC - CHCC
Bilirubin (Urine): NEGATIVE
Blood: NEGATIVE
Glucose: NEGATIVE mg/dL
Ketones: NEGATIVE mg/dL
LEUKOCYTE ESTERASE: NEGATIVE
NITRITE: NEGATIVE
Specific Gravity, Urine: 1.015 (ref 1.003–1.035)
UROBILINOGEN UR: 0.2 mg/dL (ref 0.2–1)
pH: 6 (ref 4.6–8.0)

## 2016-01-14 MED ORDER — MAGIC MOUTHWASH: 5.0000 mL | Freq: Four times a day (QID) | ORAL | Status: DC | PRN

## 2016-01-14 MED ORDER — HEPARIN SOD (PORK) LOCK FLUSH 100 UNIT/ML IV SOLN
500.0000 [IU] | Freq: Once | INTRAVENOUS | Status: AC
  Administered 2016-01-14: 500 [IU] via INTRAVENOUS
  Filled 2016-01-14: qty 5

## 2016-01-14 MED ORDER — SODIUM CHLORIDE 0.9% FLUSH
10.0000 mL | INTRAVENOUS | Status: DC | PRN
  Administered 2016-01-14: 10 mL via INTRAVENOUS
  Filled 2016-01-14: qty 10

## 2016-01-14 NOTE — Patient Instructions (Signed)
Take imodium for the diarrhea. Follow package instructions. Call the office if imodium is not effective.

## 2016-01-14 NOTE — Progress Notes (Signed)
[facility] OFFICE PROGRESS NOTE   Diagnosis:  Right breast cancer Oncology History   Breast cancer (HCC)  Staging form: Breast, AJCC 7th Edition  Pathologic stage from 11/25/2015: Stage IIA (T1c, N1a, cM0) - Unsigned  Pathologic stage from 11/25/2015: Stage IIA (T1c, N1a, cM0) - Unsigned Breast cancer of upper-outer quadrant of right female breast (HCC)  Staging form: Breast, AJCC 7th Edition  Clinical stage from 09/15/2015: Stage IA (T1b, N0, M0) - Signed by Yan Feng, MD on 12/16/2015  Pathologic stage from 11/25/2015: Stage IIA (T1c, N1a, cM0) - Signed by Yan Feng, MD on 12/16/2015         Breast cancer of upper-outer quadrant of right female breast (HCC)   09/07/2015 Mammogram Diagnostic mammogram and the ultrasound of the right breast showed a 0.8 cm lobulated mass in the right breast 11 to 12:00 position 9 cm from the nipple. No other lesions or adenopathy.   09/08/2015 Initial Diagnosis Breast cancer of upper-outer quadrant of right female breast (HCC)   09/08/2015 Initial Biopsy Right breast mass at the upper outer quadrant core needle biopsy showed invasive ductal carcinoma, grade 2-3, ductal carcinoma in situ.   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 Left breast ostectomy was negative for malignancy.       CURRENT THERAPY: Adjuvant chemotherapy with docetaxel, carboplatin, Herceptin and pejeta (TCHP) every 3 weeks started on 01/06/2016  INTERVAL HISTORY:   Ms. Tackitt returns as scheduled. She completed cycle 1 TCHP 01/06/2016. She denies significant nausea. No vomiting. She had achy  "flulike" symptoms for about a week. She began having diarrhea around day 6. She did not take any antidiarrheal medication. She had associated crampy abdominal pain. The diarrhea is improving. So far today she's had 2 loose stools. She is experiencing dysuria. She thinks she is developing mouth sores. She is tolerating fluids without difficulty. She is pushing water. No skin rash. She has stable chronic low back pain.  Objective:  Vital signs in last 24 hours:  Blood pressure 116/77, pulse 96, temperature 98.7 F (37.1 C), resp. rate 18, height 5' 6" (1.676 m), weight 152 lb 12.8 oz (69.31 kg), SpO2 98 %.    HEENT: Area of erythema left posterior palate and upper inner lip. No definite ulcerations. Resp: Lungs clear bilaterally. Cardio: Regular rate and rhythm. GI: Abdomen soft and nontender. No hepatomegaly. Vascular: No leg edema. Calves soft and nontender. Neuro: Alert and oriented. Gait normal.  Skin: No rash. Port-A-Cath without erythema.    Lab Results:  Lab Results  Component Value Date   WBC 13.4* 01/06/2016   HGB 12.9 01/06/2016   HCT 39.2 01/06/2016   MCV 90.2 01/06/2016   PLT 426* 01/06/2016   NEUTROABS 12.0* 01/06/2016    Imaging:  No results found.  Medications: I have reviewed the patient's current medications.  Assessment/Plan: 1. Right breast invasive ductal carcinoma, pT1cN1aM0, stage IIB, grade 2, ER100%+/PR100%+/HER2+  Cycle 1 TCHP 01/06/2016 2. History of stage I left breast cancer age 37 status post lumpectomy, radiation and adjuvant chemotherapy and 5 years of tamoxifen 3. Genetics. She was referred to the genetics counselor. Genetic testing was normal. 4. Diabetes mellitus 5. Bone   health. Calcium and vitamin D recommended.   Disposition: Ms. Levins appears stable. She completed cycle 1 TCHP on 01/06/2016 with Neulasta support. She developed significant diarrhea which is now improving. We discussed taking Imodium. She understands to contact the  office if Imodium is not effective.  She was given a prescription for Magic mouthwash for the mouth discomfort. At present she is tolerating fluids without difficulty. She will call the office if she is unable to maintain adequate oral hydration.  She is experiencing dysuria. Urinalysis currently pending. We will follow-up on the result. Addendum: Urinalysis unremarkable.  She will return for a follow-up visit prior to cycle 2 [facility] 01/27/2016. She will contact the office in the interim as outlined above or with any other problems.  25 minutes were spent face-to-face at today's visit with the majority of that time involved in counseling/coordination of care.    Ned Card ANP/GNP-BC   01/14/2016  3:24 PM

## 2016-01-15 LAB — URINE CULTURE: ORGANISM ID, BACTERIA: NO GROWTH

## 2016-01-17 ENCOUNTER — Encounter: Payer: Self-pay | Admitting: Hematology

## 2016-01-17 NOTE — Progress Notes (Signed)
I called and left message [phone]-patient left me a message.

## 2016-01-19 ENCOUNTER — Encounter: Payer: Self-pay | Admitting: Hematology

## 2016-01-19 NOTE — Progress Notes (Signed)
Revised fmla form and faxed for patient and gave her copy.

## 2016-01-20 ENCOUNTER — Encounter: Payer: Self-pay | Admitting: Hematology

## 2016-01-20 NOTE — Progress Notes (Signed)
Applied for copay assistance through [place]. Patient approved for $25,000 for Herceptin and $25,000 for Perjeta both effective dates 01/20/16-01/18/2017. [employer] retros back 120 days. Copy of approval letter and copay cards sent to billing via email.

## 2016-01-21 ENCOUNTER — Telehealth: Payer: Self-pay | Admitting: *Deleted

## 2016-01-21 ENCOUNTER — Ambulatory Visit (HOSPITAL_COMMUNITY): Admission: RE | Admit: 2016-01-21 | Payer: BLUE CROSS/BLUE SHIELD | Source: Ambulatory Visit

## 2016-01-21 NOTE — Telephone Encounter (Signed)
Received call from MRI stating that pt cannot have MRI due to having breast tissue expanders (will burn).  This was caught before pt came in & pt notified.  Message to Dr Burr Medico.

## 2016-01-24 ENCOUNTER — Telehealth: Payer: Self-pay | Admitting: *Deleted

## 2016-01-24 NOTE — Telephone Encounter (Signed)
No entry 

## 2016-01-27 ENCOUNTER — Telehealth: Payer: Self-pay | Admitting: Hematology

## 2016-01-27 ENCOUNTER — Ambulatory Visit: Payer: BLUE CROSS/BLUE SHIELD

## 2016-01-27 ENCOUNTER — Encounter: Payer: Self-pay | Admitting: *Deleted

## 2016-01-27 ENCOUNTER — Ambulatory Visit (HOSPITAL_BASED_OUTPATIENT_CLINIC_OR_DEPARTMENT_OTHER): Payer: BLUE CROSS/BLUE SHIELD

## 2016-01-27 ENCOUNTER — Ambulatory Visit: Payer: BLUE CROSS/BLUE SHIELD | Admitting: Hematology

## 2016-01-27 ENCOUNTER — Ambulatory Visit (HOSPITAL_BASED_OUTPATIENT_CLINIC_OR_DEPARTMENT_OTHER): Payer: BLUE CROSS/BLUE SHIELD | Admitting: Hematology

## 2016-01-27 ENCOUNTER — Other Ambulatory Visit: Payer: Self-pay | Admitting: *Deleted

## 2016-01-27 ENCOUNTER — Telehealth: Payer: Self-pay | Admitting: *Deleted

## 2016-01-27 ENCOUNTER — Encounter: Payer: Self-pay | Admitting: Hematology

## 2016-01-27 ENCOUNTER — Other Ambulatory Visit: Payer: BLUE CROSS/BLUE SHIELD

## 2016-01-27 ENCOUNTER — Other Ambulatory Visit (HOSPITAL_BASED_OUTPATIENT_CLINIC_OR_DEPARTMENT_OTHER): Payer: BLUE CROSS/BLUE SHIELD

## 2016-01-27 VITALS — BP 128/81 | HR 18 | Temp 98.4°F | Resp 18 | Ht 66.0 in | Wt 157.5 lb

## 2016-01-27 DIAGNOSIS — C50411 Malignant neoplasm of upper-outer quadrant of right female breast: Secondary | ICD-10-CM

## 2016-01-27 DIAGNOSIS — Z5112 Encounter for antineoplastic immunotherapy: Secondary | ICD-10-CM

## 2016-01-27 DIAGNOSIS — Z5111 Encounter for antineoplastic chemotherapy: Secondary | ICD-10-CM | POA: Diagnosis not present

## 2016-01-27 DIAGNOSIS — R197 Diarrhea, unspecified: Secondary | ICD-10-CM

## 2016-01-27 DIAGNOSIS — Z5189 Encounter for other specified aftercare: Secondary | ICD-10-CM

## 2016-01-27 DIAGNOSIS — E119 Type 2 diabetes mellitus without complications: Secondary | ICD-10-CM | POA: Diagnosis not present

## 2016-01-27 DIAGNOSIS — Z95828 Presence of other vascular implants and grafts: Secondary | ICD-10-CM

## 2016-01-27 DIAGNOSIS — Z853 Personal history of malignant neoplasm of breast: Secondary | ICD-10-CM

## 2016-01-27 LAB — CBC WITH DIFFERENTIAL/PLATELET
BASO%: 0.4 % (ref 0.0–2.0)
Basophils Absolute: 0 10*3/uL (ref 0.0–0.1)
EOS ABS: 0 10*3/uL (ref 0.0–0.5)
EOS%: 0.3 % (ref 0.0–7.0)
HCT: 33.6 % — ABNORMAL LOW (ref 34.8–46.6)
HEMOGLOBIN: 11.3 g/dL — AB (ref 11.6–15.9)
LYMPH#: 1.9 10*3/uL (ref 0.9–3.3)
LYMPH%: 19.8 % (ref 14.0–49.7)
MCH: 29.9 pg (ref 25.1–34.0)
MCHC: 33.6 g/dL (ref 31.5–36.0)
MCV: 88.9 fL (ref 79.5–101.0)
MONO#: 0.6 10*3/uL (ref 0.1–0.9)
MONO%: 6.6 % (ref 0.0–14.0)
NEUT%: 72.9 % (ref 38.4–76.8)
NEUTROS ABS: 7.1 10*3/uL — AB (ref 1.5–6.5)
NRBC: 0 % (ref 0–0)
PLATELETS: 336 10*3/uL (ref 145–400)
RBC: 3.78 10*6/uL (ref 3.70–5.45)
RDW: 14.2 % (ref 11.2–14.5)
WBC: 9.7 10*3/uL (ref 3.9–10.3)

## 2016-01-27 LAB — COMPREHENSIVE METABOLIC PANEL
ALBUMIN: 3.8 g/dL (ref 3.5–5.0)
ALT: 30 U/L (ref 0–55)
AST: 18 U/L (ref 5–34)
Alkaline Phosphatase: 93 U/L (ref 40–150)
Anion Gap: 10 mEq/L (ref 3–11)
BILIRUBIN TOTAL: 0.43 mg/dL (ref 0.20–1.20)
BUN: 6.9 mg/dL — ABNORMAL LOW (ref 7.0–26.0)
CO2: 26 meq/L (ref 22–29)
CREATININE: 0.8 mg/dL (ref 0.6–1.1)
Calcium: 9.1 mg/dL (ref 8.4–10.4)
Chloride: 106 mEq/L (ref 98–109)
EGFR: >90 mL/min/{1.73_m2} (ref >90)
GLUCOSE: 118 mg/dL (ref 70–140)
Potassium: 3.4 mEq/L — ABNORMAL LOW (ref 3.5–5.1)
SODIUM: 142 meq/L (ref 136–145)
TOTAL PROTEIN: 7.5 g/dL (ref 6.4–8.3)

## 2016-01-27 MED ORDER — PEGFILGRASTIM 6 MG/0.6ML ~~LOC~~ PSKT
6.0000 mg | PREFILLED_SYRINGE | Freq: Once | SUBCUTANEOUS | Status: AC
  Administered 2016-01-27: 6 mg via SUBCUTANEOUS
  Filled 2016-01-27: qty 0.6

## 2016-01-27 MED ORDER — PALONOSETRON HCL INJECTION 0.25 MG/5ML
INTRAVENOUS | Status: AC
  Filled 2016-01-27: qty 5

## 2016-01-27 MED ORDER — PALONOSETRON HCL INJECTION 0.25 MG/5ML
0.2500 mg | Freq: Once | INTRAVENOUS | Status: AC
Start: 2016-01-27 — End: 2016-01-27
  Administered 2016-01-27: 0.25 mg via INTRAVENOUS

## 2016-01-27 MED ORDER — HEPARIN SOD (PORK) LOCK FLUSH 100 UNIT/ML IV SOLN
500.0000 [IU] | Freq: Once | INTRAVENOUS | Status: AC | PRN
  Administered 2016-01-27: 500 [IU]
  Filled 2016-01-27: qty 5

## 2016-01-27 MED ORDER — POTASSIUM CHLORIDE CRYS ER 20 MEQ PO TBCR: 20.0000 meq | EXTENDED_RELEASE_TABLET | Freq: Every day | ORAL | Status: DC

## 2016-01-27 MED ORDER — SODIUM CHLORIDE 0.9 % IJ SOLN
10.0000 mL | INTRAMUSCULAR | Status: DC | PRN
  Administered 2016-01-27: 10 mL
  Filled 2016-01-27: qty 10

## 2016-01-27 MED ORDER — SODIUM CHLORIDE 0.9% FLUSH
10.0000 mL | INTRAVENOUS | Status: DC | PRN
  Administered 2016-01-27: 10 mL via INTRAVENOUS
  Filled 2016-01-27: qty 10

## 2016-01-27 MED ORDER — SODIUM CHLORIDE 0.9 % IV SOLN
Freq: Once | INTRAVENOUS | Status: AC
  Administered 2016-01-27: 11:00:00 via INTRAVENOUS
  Filled 2016-01-27: qty 5

## 2016-01-27 MED ORDER — SODIUM CHLORIDE 0.9 % IV SOLN
711.6000 mg | Freq: Once | INTRAVENOUS | Status: AC
  Administered 2016-01-27: 710 mg via INTRAVENOUS
  Filled 2016-01-27: qty 71

## 2016-01-27 MED ORDER — ACETAMINOPHEN 325 MG PO TABS
ORAL_TABLET | ORAL | Status: AC
  Filled 2016-01-27: qty 2

## 2016-01-27 MED ORDER — PERTUZUMAB CHEMO INJECTION 420 MG/14ML
420.0000 mg | Freq: Once | INTRAVENOUS | Status: AC
  Administered 2016-01-27: 420 mg via INTRAVENOUS
  Filled 2016-01-27: qty 14

## 2016-01-27 MED ORDER — ACETAMINOPHEN 325 MG PO TABS
650.0000 mg | ORAL_TABLET | Freq: Once | ORAL | Status: AC
  Administered 2016-01-27: 650 mg via ORAL

## 2016-01-27 MED ORDER — DIPHENHYDRAMINE HCL 25 MG PO CAPS
50.0000 mg | ORAL_CAPSULE | Freq: Once | ORAL | Status: AC
  Administered 2016-01-27: 50 mg via ORAL

## 2016-01-27 MED ORDER — TRASTUZUMAB CHEMO INJECTION 440 MG
6.0000 mg/kg | Freq: Once | INTRAVENOUS | Status: AC
  Administered 2016-01-27: 399 mg via INTRAVENOUS
  Filled 2016-01-27: qty 19

## 2016-01-27 MED ORDER — DIPHENHYDRAMINE HCL 25 MG PO CAPS
ORAL_CAPSULE | ORAL | Status: AC
  Filled 2016-01-27: qty 2

## 2016-01-27 MED ORDER — DOCETAXEL CHEMO INJECTION 160 MG/16ML
75.0000 mg/m2 | Freq: Once | INTRAVENOUS | Status: AC
  Administered 2016-01-27: 130 mg via INTRAVENOUS
  Filled 2016-01-27: qty 13

## 2016-01-27 MED FILL — KLOR-CON M20 TABLET: 20 | 30 days supply | Qty: 30 | Fill #0

## 2016-01-27 NOTE — Telephone Encounter (Signed)
Pt confirmed labs/ov per 02/16 POF, gave pt AVS and Calendar.... KJ, sent msg to add chmeo

## 2016-01-27 NOTE — Telephone Encounter (Signed)
per pof to sch pt appt-sent MW email to sch trmt-gave pt copy of avs °

## 2016-01-27 NOTE — Telephone Encounter (Signed)
Patient saw Dr. Burr Medico today.  VO given and read back and escribed for K+

## 2016-01-27 NOTE — Progress Notes (Signed)
[place]  [phone] Fax:([phone]) [phone]  Clinic follow Up Note   Patient Care Team: Kristie Cowman, MD as PCP - General (Family Medicine) Stark Klein, MD as Consulting Physician (General Surgery) Truitt Merle, MD as Consulting Physician (Hematology) Arloa Koh, MD as Consulting Physician (Radiation Oncology) Mauro Kaufmann, RN as Registered Nurse Rockwell Germany, RN as Registered Nurse Sylvan Cheese, NP as Nurse Practitioner (Nurse Practitioner) 01/27/2016  CHIEF COMPLAINTS:  Follow up right breast cancer  Oncology History   Breast cancer [facility])   Staging form: Breast, AJCC 7th Edition     Pathologic stage from 11/25/2015: Stage IIA (T1c, N1a, cM0) - Unsigned     Pathologic stage from 11/25/2015: Stage IIA (T1c, N1a, cM0) - Unsigned Breast cancer of upper-outer quadrant of right female breast [facility])   Staging form: Breast, AJCC 7th Edition     Clinical stage from 09/15/2015: Stage IA (T1b, N0, M0) - Signed by Truitt Merle, MD on 12/16/2015     Pathologic stage from 11/25/2015: Stage IIA (T1c, N1a, cM0) - Signed by Truitt Merle, MD on 12/16/2015         Breast cancer of upper-outer quadrant of right female breast ([place])   09/07/2015 Mammogram Diagnostic mammogram and the ultrasound of the right breast showed a 0.8 cm lobulated mass in the right breast 11 to 12:00 position 9 cm from the nipple. No other lesions or adenopathy.   09/08/2015 Initial Diagnosis Breast cancer of upper-outer quadrant of right female breast ([place])   09/08/2015 Initial Biopsy Right breast mass at the upper outer quadrant core needle biopsy showed invasive ductal carcinoma, grade 2-3, ductal carcinoma in situ.   09/08/2015 Receptors her2 ER 100% positive, PR 100% positive, Ki-67 60%, HER-2 positive with copy # 4.25 and ratio 2.58   11/25/2015 Surgery Right breast mastectomy and sentinel lymph node biopsy   11/25/2015 Pathology Results Right breast invasive ductal carcinoma, grade 2, 2.0  cm, DCIS, intermediate grade, (+) LVI, margins were negative, 1 out of 3 lymph nodes were negative. Left breast ostectomy was negative for malignancy.    HISTORY OF PRESENTING ILLNESS:  Sandra James 50 y.o. female with past medical history of stage I left breast cancer, is here because of newly diagnosed right breast cancer. She presents to our multidisciplinary breast clinic by herself.  The right breast cancer was discovered by screening mammogram. She did not have any palpable mass, or any constitutional symptoms. She was diagnosed with stage I left breast cancer at age of 68, she had lumpectomy, radiation, and adjuvant chemotherapy and 5 years of tamoxifen. She was treated by Dr. Louann Sjogren in [place]. She moved to [facility] about year ago due to job change. She has been very compliant with annual screening mammogram.  She had hysterectomy and bilateral oophorectomy and sphincterectomy 5 years ago for heavy bleeding.. She has been having hot flashes since then, moderate, but manageable. She is single, lives alone, works for 2 to [place] has to go up children who live in [place].  CURRENT THERAPY: Adjuvant chemotherapy with docetaxel, carboplatin, Herceptin and pejeta (TCHP) every 3 weeks started on 01/06/2016  [name] returns for follow-up and second cycle chemo. She tolerated the first cycle chemotherapy relatively well. She had mild to moderate diarrhea, maximum with loose bowel movement 4 times a day, no significant nausea, appetite change. She has been eating and drinking very well. She has some taste change, but has good appetite, and gained about 5  pounds in the past 2 weeks. No fever or chills.  MED ICAL HISTORY:  Past Medical History  Diagnosis Date  . MVP (mitral valve prolapse)   . Infection     UTI  . Anxiety   . Fibroid   . Cancer [facility])     left brast lumpectomy   . Breast cancer of upper-outer quadrant of right female breast ([place]) 09/09/2015  .  Diabetes mellitus without complication ([place])   . Depression   . GERD (gastroesophageal reflux disease)   . Complication of anesthesia     slow to wake up- BP was low, fainted next day-  . Breast cancer ([place]) 09/07/15    right breast  . Allergy   . UTI (lower urinary tract infection)   . Neuromuscular disorder ([place])     sciatic nerve paion left side/leg    SURGICAL HISTORY: Past Surgical History  Procedure Laterality Date  . Abdominal hysterectomy    . Breast surgery  2004    left lumpectomy  . Oophorectomy Bilateral   . Mastectomy w/ sentinel node biopsy Bilateral 11/25/2015    Procedure: BILATERAL SKIN SPARING MASTECTOMIES WITH RIGHT SENTINEL LYMPH NODE BIOPSY;  Surgeon: Stark Klein, MD;  Location: [facility];  Service: General;  Laterality: Bilateral;  . Breast reconstruction with placement of tissue expander and flex hd (acellular hydrated dermis) Bilateral 11/25/2015    Procedure: BILATERAL IMMEDIATE BREAST RECONSTRUCTION WITH PLACEMENT OF TISSUE EXPANDERS AND FLEX HD (ACELLULAR HYDRATED DERMIS);  Surgeon: Wallace Going, DO;  Location: [facility];  Service: Plastics;  Laterality: Bilateral;  . Portacath placement Left 12/20/2015    Procedure: INSERTION PORT-A-CATH, left chest;  Surgeon: Stark Klein, MD;  Location: [facility];  Service: General;  Laterality: Left;    SOCIAL HISTORY: Social History   Social History  . Marital Status: Single     Spouse Name: N/A  . Number of Children: 2 children, 55 daughter and 41 yo son    . Years of Education: N/A   Occupational History  . She is a Barista rep   Social History Main Topics  . Smoking status: Never Smoker   . Smokeless tobacco: Never Used  . Alcohol Use: Yes     Comment: 2-3/wk  . Drug Use: No  . Sexual Activity: Yes    Birth Control/ Protection: Surgical   Other Topics Concern  . Not on file   Social History Narrative    FAMILY HISTORY: Family History  Problem Relation Age of  Onset  . Hypertension Mother   . Stroke Mother   . Alzheimer's disease Mother   . Heart disease Father   . Stroke Father   . Heart attack Father   . Breast cancer Sister 42    double mastectomy  . Other Sister     "stomach tumor that wrapped around reproductive organs"; dx. 2s; required partial hysterectomy  . Alzheimer's disease Maternal Grandmother   . Alzheimer's disease Paternal Grandmother   . Other Other     had to have lymph nodes removed and radiation; dx. 28s  . Cancer Maternal Aunt     unspecified type; dx. <50  . Colon cancer Maternal Uncle     dx. 50s  . Breast cancer Cousin     dx. 39s    ALLERGIES:  is allergic to lisinopril; oxycodone; and percocet.  MEDICATIONS:  Current Outpatient Prescriptions  Medication Sig Dispense Refill  . diazepam (VALIUM) 2 MG tablet Take 2 mg by mouth  every 6 (six) hours as needed for muscle spasms. Reported on 12/22/2015    . HYDROcodone-acetaminophen (NORCO/VICODIN) 5-325 MG tablet Take 1 tablet by mouth every 6 (six) hours as needed for moderate pain. 30 tablet 0  . lidocaine-prilocaine (EMLA) cream Apply to affected area once 30 g 3  . magic mouthwash SOLN Take 5 mLs by mouth 4 (four) times daily as needed for mouth pain. 320 mL 1  . metFORMIN (GLUCOPHAGE) 500 MG tablet Take 500 mg by mouth 2 (two) times daily with a meal. Reported on 12/22/2015  0  . mirtazapine (REMERON) 30 MG tablet Take 1 tablet (30 mg total) by mouth at bedtime. 30 tablet 3  . omeprazole (PRILOSEC) 20 MG capsule Take 1 capsule (20 mg total) by mouth daily. 14 capsule 0  . ondansetron (ZOFRAN) 8 MG tablet Take 1 tablet (8 mg total) by mouth 2 (two) times daily. Start the day after chemo for 3 days. Then take as needed for nausea or vomiting. 30 tablet 1  . polyethylene glycol (MIRALAX / GLYCOLAX) packet Take 17 g by mouth daily as needed for mild constipation. 14 each 0  . prochlorperazine (COMPAZINE) 10 MG tablet Take 1 tablet (10 mg total) by mouth every 6 (six)  hours as needed (Nausea or vomiting). 30 tablet 1  . dexamethasone (DECADRON) 4 MG tablet Take 2 tablets (8 mg total) by mouth 2 (two) times daily. Start the day before Taxotere. Then again the day after chemo for 3 days. (Patient not taking: Reported on 01/27/2016) 30 tablet 1  . potassium chloride SA (K-DUR,KLOR-CON) 20 MEQ tablet Take 1 tablet (20 mEq total) by mouth daily. 30 tablet 1   No current facility-administered medications for this visit.   Facility-Administered Medications Ordered in Other Visits  Medication Dose Route Frequency Provider Last Rate Last Dose  . sodium chloride 0.9 % injection 10 mL  10 mL Intracatheter PRN Truitt Merle, MD   10 mL at 01/06/16 1730    REVIEW OF SYSTEMS:   Constitutional: Denies fevers, chills or abnormal night sweats Eyes: Denies blurriness of vision, double vision or watery eyes Ears, nose, mouth, throat, and face: Denies mucositis or sore throat Respiratory: Denies cough, dyspnea or wheezes Cardiovascular: Denies palpitation, chest discomfort or lower extremity swelling Gastrointestinal:  Denies nausea, heartburn or change in bowel habits Skin: Denies abnormal skin rashes Lymphatics: Denies new lymphadenopathy or easy bruising Neurological:Denies numbness, tingling or new weaknesses Behavioral/Psych: Mood is stable, no new changes  All other systems were reviewed with the patient and are negative.  PHYSICAL EXAMINATION: ECOG PERFORMANCE STATUS: 1  Filed Vitals:   01/27/16 0949  BP: 128/81  Pulse: 18  Temp: 98.4 F (36.9 C)  Resp: 18   Filed Weights   01/27/16 0949  Weight: 157 lb 8 oz (71.442 kg)    GENERAL:alert, no distress and comfortable SKIN: skin color, texture, turgor are normal, no rashes or significant lesions EYES: normal, conjunctiva are pink and non-injected, sclera clear OROPHARYNX:no exudate, no erythema and lips, buccal mucosa, and tongue normal  NECK: supple, thyroid normal size, non-tender, without  nodularity LYMPH:  no palpable lymphadenopathy in the cervical, axillary or inguinal LUNGS: clear to auscultation and percussion with normal breathing effort HEART: regular rate & rhythm and no murmurs and no lower extremity edema ABDOMEN:abdomen soft, non-tender and normal bowel sounds Musculoskeletal:no cyanosis of digits and no clubbing  PSYCH: alert & oriented x 3 with fluent speech NEURO: no focal motor/sensory deficits Breasts: s/p bilateral mastectomy. Surgical  incision sites are clean, no surrounding skin erythema or discharge. There is a mild discharge at the J-tube site, no surrounding skin erythema.   LABORATORY DATA:  I have reviewed the data as listed CBC Latest Ref Rng 01/27/2016 01/14/2016 01/06/2016  WBC 3.9 - 10.3 10e3/uL 9.7 32.3(H) 13.4(H)  Hemoglobin 11.6 - 15.9 g/dL 11.3(L) 12.2 12.9  Hematocrit 34.8 - 46.6 % 33.6(L) 38.1 39.2  Platelets 145 - 400 10e3/uL 336 284 426(H)    CMP Latest Ref Rng 01/27/2016 01/14/2016 01/06/2016  Glucose 70 - 140 mg/dl 118 91 172(H)  BUN 7.0 - 26.0 mg/dL 6.9(L) 4.8(L) 12.7  Creatinine 0.6 - 1.1 mg/dL 0.8 0.8 0.8  Sodium 136 - 145 mEq/L 142 140 139  Potassium 3.5 - 5.1 mEq/L 3.4(L) 3.2(L) 3.7  Chloride 101 - 111 mmol/L - - -  CO2 22 - 29 mEq/L 26 26 21(L)  Calcium 8.4 - 10.4 mg/dL 9.1 9.2 9.9  Total Protein 6.4 - 8.3 g/dL 7.5 7.6 8.2  Total Bilirubin 0.20 - 1.20 mg/dL 0.43 <0.30 0.48  Alkaline Phos 40 - 150 U/L 93 109 87  AST 5 - 34 U/L _0 ALT 0 - 55 U/L 30 35 15     Pathology report Diagnosis 11/25/2015 1. Breast, simple mastectomy, right - INVASIVE DUCTAL CARCINOMA, GRADE 2/3, SPANNING 2.0 CM. - DUCTAL CARCINOMA IN SITU, INTERMEDIATE GRADE. - LYMPHOVASCULAR INVASION IS IDENTIFIED. - LOBULAR NEOPLASIA (ATYPICAL LOBULAR HYPERPLASIA). - THE SURGICAL RESECTION MARGINS ARE NEGATIVE FOR CARCINOMA. - SEE ONCOLOGY TABLE BELOW. 2. Lymph node, sentinel, biopsy, right axillary #1 - METASTATIC CARCINOMA IN 1 OF 1 LYMPH NODE  (1/1). 3. Lymph node, sentinel, biopsy, right axillary #2 - THERE IS NO EVIDENCE OF CARCINOMA IN 1 OF 1 LYMPH NODE (0/1). 4. Lymph node, sentinel, biopsy, right axillary #3 - THERE IS NO EVIDENCE OF CARCINOMA IN 1 OF 1 LYMPH NODE (0/1). 5. Breast, simple mastectomy, left - BENIGN BREAST PARENCHYMA WITH DENSE STROMAL FIBROSIS. - FIBROADENOMA. - THERE IS NO EVIDENCE OF MALIGNANCY. 6. Breast, excision, left, additional lateral margin - BENIGN FIBROADIPOSE TISSUE. - THERE IS NO EVIDENCE OF MALIGNANCY. - SEE COMMENT. Microscopic Comment 1. BREAST, INVASIVE TUMOR, WITH LYMPH NODES PRESENT Specimen, including laterality and lymph node sampling (sentinel, non-sentinel): Right breast and right axillary lymph nodes Procedure: Bilateral mastectomy and multiple right axillary lymph node resections Histologic type: Ductal Grade: 2 Tubule formation: 2 1 of 4 FINAL for Hammack, Virgen ([id number]) Microscopic Comment(continued) Nuclear pleomorphism: 2 Mitotic: 2 Tumor size (gross measurement): 2.0 cm Margins: Negative for carcinoma Invasive, distance to closest margin: 1.8 cm to the posterior margin In-situ, distance to closest margin: 1.8 cm to the posterior margin Lymphovascular invasion: Present Ductal carcinoma in situ: Present Grade: Intermediate grade Extensive intraductal component: Not identified Lobular neoplasia: Present, atypical lobular hyperplasia Tumor focality: Unifocal Treatment effect: Not identified Extent of tumor: Confined to breast parenchyma Lymph nodes: Examined: 3 Sentinel 0 Non-sentinel 3 Total Lymph nodes with metastasis: 1 (macrometastasis) - no extracapsular extension is identified. Breast prognostic profile: [phone] Estrogen receptor: 100%, strong staining intensity Progesterone receptor: 100%, strong staining intensity Her 2 neu: Amplification was detected. The ratio was 2.58 Ki-67: 60% TNM: pT1c, pN1a Non-neoplastic breast: No significant  findings. 6. The surgical resection margin(s) of the specimen were inked and microscopically evaluated. Enid Cutter MD Pathologist, Electronic Signature (Case signed 11/29/2015)   RADIOGRAPHIC STUDIES: I have personally reviewed the radiological images as listed and agreed with the findings in the report.  Bone scan 12/28/2014  IMPRESSION: Foci of increased tracer localization at the sternum, unable to exclude metastases.  Uptake at inferior cervical spine could potentially be related to degenerative disc disease changes present at C5-C6 on CT.  Questionable abnormal increased tracer localization at the posterior LEFT ninth and tenth ribs.  CT chest, abdomen and pelvis 12/28/2014 IMPRESSION: 1. 2 cm enhancing lesion in segment 5 of the liver is indeterminate. It could reflect FNH, vascular shunt and less likely metastasis. Recommend MRI abdomen without and with contrast using Eovist for further evaluation. No other hepatic lesions. 2. No findings for metastatic disease involving the chest. There is a focal area of airspace disease in the superior segment of the right lower lobe which could be a developing or resolving infiltrate. Recommend correlation with clinical symptoms.  ECHO 12/21/2015 Study Conclusions  - Left ventricle: The cavity size was normal. Wall thickness was increased in a pattern of mild LVH. Systolic function was normal. The estimated ejection fraction was in the range of 55% to 60%. Wall motion was normal; there were no regional wall motion abnormalities. Global longitudinal strain: -18.5% There was no evidence of elevated ventricular filling pressure by Doppler parameters.  ASSESSMENT & PLAN:  50 year old African-American female, surgical postmenopausal, history of stage I left breast cancer, with newly diagnosed right stage I breast cancer.  1. Right breast invasive ductal carcinoma, pT1cN1aM0, stage IIB, grade 2, ER100%+/PR100%+/HER2+ -I previously  reviewed her surgical pathology results with patient in great details. She has 1 out of 3 sentinel lymph nodes positive, her pathological stage is more advanced than her initial clinical stage.  -We reviewed the natural history of triple positive breast cancer. HER-2 positive tumors are more progressive, with higher risk of recurrence -I reviewed her restaging CT scan and bone scan images with her in person. I discussed the bone scan findings with radiologist Dr. Thornton Papas. The mild hypermetabolic uptake in the sternum and left ninth and 10th rib have no significant corresponding change on the CT scan, except for small lytic spot in the sternum. They're not amenable for biopsy, PET scan may not have much additional value, the suspicion for metastases from breast cancer is low. I recommend follow-up. The 2 cm lesion in the liver are likely benign, unfortunately she is not able to do liver MRI, due to her breast implants.  -She is still concerned about the above scan findings. I recommend to have a repeated CT or PET scan a few months after she completes adjuvant chemotherapy for follow-up. -Given her high risk of cancer recurrence, I recommend adjuvant chemotherapy with docetaxel, carboplatin, Herceptin and Perjeta (TCHP), every 3 weeks for 6 cycles,  followed by maintenance Herceptin to complete a 1 year therapy. She previously received Adriamycin, will not be able to tolerate more adrimycin. -She is tolerating chemotherapy well, we'll continue.   2. Diarrhea  -Secondary to chemotherapy. -I recommend her to use Imodium as needed, and a drink fluids adequately to avoid dehydration.  3. Genetics -Due to her young age and recurrent breast cancer, she was referred to see a genetic counselor in our cancer center. -Her genetic test was negative  4. DM -she will continue follow-up with her primary care physician -She is on metformin -We again reviewed steroids induced hyperglycemia, she'll monitor her sugar  closely at home, especially after chemotherapy.  4. Bone health -She is postmenopausal by surgery -I encouraged her to take calcium and vitamin D -I'll obtain a baseline bone density scan   Plan for today  -Lab reviewed,  adequate for treatment, cycle 2 today.  -RTC in 3 weeks with lab, f/p and chemo   All questions were answered. The patient knows to call the clinic with any problems, questions or concerns. I spent 25 minutes counseling the patient face to face. The total time spent in the appointment was 30 minutes and more than 50% was on counseling.     Truitt Merle, MD 01/27/2016

## 2016-01-27 NOTE — Patient Instructions (Addendum)
[facility] Discharge Instructions for Patients Receiving Chemotherapy  Today you received the following chemotherapy agents Herceptin, Perjeta, Taxotere and Carboplatin  To help prevent nausea and vomiting after your treatment, we encourage you to take your nausea medication {per MD orders and AVS If you develop nausea and vomiting that is not controlled by your nausea medication, call the clinic.   BELOW ARE SYMPTOMS THAT SHOULD BE REPORTED IMMEDIATELY:  *FEVER GREATER THAN 100.5 F  *CHILLS WITH OR WITHOUT FEVER  NAUSEA AND VOMITING THAT IS NOT CONTROLLED WITH YOUR NAUSEA MEDICATION  *UNUSUAL SHORTNESS OF BREATH  *UNUSUAL BRUISING OR BLEEDING  TENDERNESS IN MOUTH AND THROAT WITH OR WITHOUT PRESENCE OF ULCERS  *URINARY PROBLEMS  *BOWEL PROBLEMS  UNUSUAL RASH Items with * indicate a potential emergency and should be followed up as soon as possible.  Feel free to call the clinic you have any questions or concerns. The clinic phone number is [phone].  Please show the [facility] at check-in to the Emergency Department and triage nurse.  Trastuzumab injection for infusion What is this medicine? TRASTUZUMAB (tras TOO zoo mab) is a monoclonal antibody. It is used to treat breast cancer and stomach cancer. This medicine may be used for other purposes; ask your health care provider or pharmacist if you have questions. What should I tell my health care provider before I take this medicine? They need to know if you have any of these conditions: -heart disease -heart failure -infection (especially a virus infection such as chickenpox, cold sores, or herpes) -lung or breathing disease, like asthma -recent or ongoing radiation therapy -an unusual or allergic reaction to trastuzumab, benzyl alcohol, or other medications, foods, dyes, or preservatives -pregnant or trying to get pregnant -breast-feeding How should I use this medicine? This drug is given as  an infusion into a vein. It is administered in a hospital or clinic by a specially trained health care professional. Talk to your pediatrician regarding the use of this medicine in children. This medicine is not approved for use in children. Overdosage: If you think you have taken too much of this medicine contact a poison control center or emergency room at once. NOTE: This medicine is only for you. Do not share this medicine with others. What if I miss a dose? It is important not to miss a dose. Call your doctor or health care professional if you are unable to keep an appointment. What may interact with this medicine? -doxorubicin -warfarin This list may not describe all possible interactions. Give your health care provider a list of all the medicines, herbs, non-prescription drugs, or dietary supplements you use. Also tell them if you smoke, drink alcohol, or use illegal drugs. Some items may interact with your medicine. What should I watch for while using this medicine? Visit your doctor for checks on your progress. Report any side effects. Continue your course of treatment even though you feel ill unless your doctor tells you to stop. Call your doctor or health care professional for advice if you get a fever, chills or sore throat, or other symptoms of a cold or flu. Do not treat yourself. Try to avoid being around people who are sick. You may experience fever, chills and shaking during your first infusion. These effects are usually mild and can be treated with other medicines. Report any side effects during the infusion to your health care professional. Fever and chills usually do not happen with later infusions. Do not become pregnant while  taking this medicine or for 7 months after stopping it. Women should inform their doctor if they wish to become pregnant or think they might be pregnant. Women of child-bearing potential will need to have a negative pregnancy test before starting this  medicine. There is a potential for serious side effects to an unborn child. Talk to your health care professional or pharmacist for more information. Do not breast-feed an infant while taking this medicine or for 7 months after stopping it. Women must use effective birth control with this medicine. What side effects may I notice from receiving this medicine? Side effects that you should report to your doctor or other health care professional as soon as possible: -breathing difficulties -chest pain or palpitations -cough -dizziness or fainting -fever or chills, sore throat -skin rash, itching or hives -swelling of the legs or ankles -unusually weak or tired Side effects that usually do not require medical attention (report to your doctor or other health care professional if they continue or are bothersome): -loss of appetite -headache -muscle aches -nausea This list may not describe all possible side effects. Call your doctor for medical advice about side effects. You may report side effects to FDA at 1-800-FDA-1088. Where should I keep my medicine? This drug is given in a hospital or clinic and will not be stored at home. NOTE: This sheet is a summary. It may not cover all possible information. If you have questions about this medicine, talk to your doctor, pharmacist, or health care provider.    2016, Elsevier/Gold Standard. (2015-03-05 11:49:32) Pertuzumab injection What is this medicine? PERTUZUMAB (per TOOZ ue mab) is a monoclonal antibody. It is used to treat breast cancer. This medicine may be used for other purposes; ask your health care provider or pharmacist if you have questions. What should I tell my health care provider before I take this medicine? They need to know if you have any of these conditions: -heart disease -heart failure -high blood pressure -history of irregular heart beat -recent or ongoing radiation therapy -an unusual or allergic reaction to pertuzumab,  other medicines, foods, dyes, or preservatives -pregnant or trying to get pregnant -breast-feeding How should I use this medicine? This medicine is for infusion into a vein. It is given by a health care professional in a hospital or clinic setting. Talk to your pediatrician regarding the use of this medicine in children. Special care may be needed. Overdosage: If you think you have taken too much of this medicine contact a poison control center or emergency room at once. NOTE: This medicine is only for you. Do not share this medicine with others. What if I miss a dose? It is important not to miss your dose. Call your doctor or health care professional if you are unable to keep an appointment. What may interact with this medicine? Interactions are not expected. Give your health care provider a list of all the medicines, herbs, non-prescription drugs, or dietary supplements you use. Also tell them if you smoke, drink alcohol, or use illegal drugs. Some items may interact with your medicine. This list may not describe all possible interactions. Give your health care provider a list of all the medicines, herbs, non-prescription drugs, or dietary supplements you use. Also tell them if you smoke, drink alcohol, or use illegal drugs. Some items may interact with your medicine. What should I watch for while using this medicine? Your condition will be monitored carefully while you are receiving this medicine. Report any side effects. Continue  your course of treatment even though you feel ill unless your doctor tells you to stop. Do not become pregnant while taking this medicine or for 7 months after stopping it. Women should inform their doctor if they wish to become pregnant or think they might be pregnant. Women of child-bearing potential will need to have a negative pregnancy test before starting this medicine. There is a potential for serious side effects to an unborn child. Talk to your health care  professional or pharmacist for more information. Do not breast-feed an infant while taking this medicine or for 7 months after stopping it. Women must use effective birth control with this medicine. Call your doctor or health care professional for advice if you get a fever, chills or sore throat, or other symptoms of a cold or flu. Do not treat yourself. Try to avoid being around people who are sick. You may experience fever, chills, and headache during the infusion. Report any side effects during the infusion to your health care professional. What side effects may I notice from receiving this medicine? Side effects that you should report to your doctor or health care professional as soon as possible: -breathing problems -chest pain or palpitations -dizziness -feeling faint or lightheaded -fever or chills -skin rash, itching or hives -sore throat -swelling of the face, lips, or tongue -swelling of the legs or ankles -unusually weak or tired Side effects that usually do not require medical attention (Report these to your doctor or health care professional if they continue or are bothersome.): -diarrhea -hair loss -nausea, vomiting -tiredness This list may not describe all possible side effects. Call your doctor for medical advice about side effects. You may report side effects to FDA at 1-800-FDA-1088. Where should I keep my medicine? This drug is given in a hospital or clinic and will not be stored at home. NOTE: This sheet is a summary. It may not cover all possible information. If you have questions about this medicine, talk to your doctor, pharmacist, or health care provider.    2016, Elsevier/Gold Standard. (2015-03-05 16:07:57) Carboplatin injection What is this medicine? CARBOPLATIN (KAR boe pla tin) is a chemotherapy drug. It targets fast dividing cells, like cancer cells, and causes these cells to die. This medicine is used to treat ovarian cancer and many other cancers. This  medicine may be used for other purposes; ask your health care provider or pharmacist if you have questions. What should I tell my health care provider before I take this medicine? They need to know if you have any of these conditions: -blood disorders -hearing problems -kidney disease -recent or ongoing radiation therapy -an unusual or allergic reaction to carboplatin, cisplatin, other chemotherapy, other medicines, foods, dyes, or preservatives -pregnant or trying to get pregnant -breast-feeding How should I use this medicine? This drug is usually given as an infusion into a vein. It is administered in a hospital or clinic by a specially trained health care professional. Talk to your pediatrician regarding the use of this medicine in children. Special care may be needed. Overdosage: If you think you have taken too much of this medicine contact a poison control center or emergency room at once. NOTE: This medicine is only for you. Do not share this medicine with others. What if I miss a dose? It is important not to miss a dose. Call your doctor or health care professional if you are unable to keep an appointment. What may interact with this medicine? -medicines for seizures -medicines to increase  blood counts like filgrastim, pegfilgrastim, sargramostim -some antibiotics like amikacin, gentamicin, neomycin, streptomycin, tobramycin -vaccines Talk to your doctor or health care professional before taking any of these medicines: -acetaminophen -aspirin -ibuprofen -ketoprofen -naproxen This list may not describe all possible interactions. Give your health care provider a list of all the medicines, herbs, non-prescription drugs, or dietary supplements you use. Also tell them if you smoke, drink alcohol, or use illegal drugs. Some items may interact with your medicine. What should I watch for while using this medicine? Your condition will be monitored carefully while you are receiving this  medicine. You will need important blood work done while you are taking this medicine. This drug may make you feel generally unwell. This is not uncommon, as chemotherapy can affect healthy cells as well as cancer cells. Report any side effects. Continue your course of treatment even though you feel ill unless your doctor tells you to stop. In some cases, you may be given additional medicines to help with side effects. Follow all directions for their use. Call your doctor or health care professional for advice if you get a fever, chills or sore throat, or other symptoms of a cold or flu. Do not treat yourself. This drug decreases your body's ability to fight infections. Try to avoid being around people who are sick. This medicine may increase your risk to bruise or bleed. Call your doctor or health care professional if you notice any unusual bleeding. Be careful brushing and flossing your teeth or using a toothpick because you may get an infection or bleed more easily. If you have any dental work done, tell your dentist you are receiving this medicine. Avoid taking products that contain aspirin, acetaminophen, ibuprofen, naproxen, or ketoprofen unless instructed by your doctor. These medicines may hide a fever. Do not become pregnant while taking this medicine. Women should inform their doctor if they wish to become pregnant or think they might be pregnant. There is a potential for serious side effects to an unborn child. Talk to your health care professional or pharmacist for more information. Do not breast-feed an infant while taking this medicine. What side effects may I notice from receiving this medicine? Side effects that you should report to your doctor or health care professional as soon as possible: -allergic reactions like skin rash, itching or hives, swelling of the face, lips, or tongue -signs of infection - fever or chills, cough, sore throat, pain or difficulty passing urine -signs of  decreased platelets or bleeding - bruising, pinpoint red spots on the skin, black, tarry stools, nosebleeds -signs of decreased red blood cells - unusually weak or tired, fainting spells, lightheadedness -breathing problems -changes in hearing -changes in vision -chest pain -high blood pressure -low blood counts - This drug may decrease the number of white blood cells, red blood cells and platelets. You may be at increased risk for infections and bleeding. -nausea and vomiting -pain, swelling, redness or irritation at the injection site -pain, tingling, numbness in the hands or feet -problems with balance, talking, walking -trouble passing urine or change in the amount of urine Side effects that usually do not require medical attention (report to your doctor or health care professional if they continue or are bothersome): -hair loss -loss of appetite -metallic taste in the mouth or changes in taste This list may not describe all possible side effects. Call your doctor for medical advice about side effects. You may report side effects to FDA at 1-800-FDA-1088. Where should I keep  my medicine? This drug is given in a hospital or clinic and will not be stored at home. NOTE: This sheet is a summary. It may not cover all possible information. If you have questions about this medicine, talk to your doctor, pharmacist, or health care provider.    2016, Elsevier/Gold Standard. (2008-03-03 14:38:05) Docetaxel injection What is this medicine? DOCETAXEL (doe se TAX el) is a chemotherapy drug. It targets fast dividing cells, like cancer cells, and causes these cells to die. This medicine is used to treat many types of cancers like breast cancer, certain stomach cancers, head and neck cancer, lung cancer, and prostate cancer. This medicine may be used for other purposes; ask your health care provider or pharmacist if you have questions. What should I tell my health care provider before I take this  medicine? They need to know if you have any of these conditions: -infection (especially a virus infection such as chickenpox, cold sores, or herpes) -liver disease -low blood counts, like low white cell, platelet, or red cell counts -an unusual or allergic reaction to docetaxel, polysorbate 80, other chemotherapy agents, other medicines, foods, dyes, or preservatives -pregnant or trying to get pregnant -breast-feeding How should I use this medicine? This drug is given as an infusion into a vein. It is administered in a hospital or clinic by a specially trained health care professional. Talk to your pediatrician regarding the use of this medicine in children. Special care may be needed. Overdosage: If you think you have taken too much of this medicine contact a poison control center or emergency room at once. NOTE: This medicine is only for you. Do not share this medicine with others. What if I miss a dose? It is important not to miss your dose. Call your doctor or health care professional if you are unable to keep an appointment. What may interact with this medicine? -cyclosporine -erythromycin -ketoconazole -medicines to increase blood counts like filgrastim, pegfilgrastim, sargramostim -vaccines Talk to your doctor or health care professional before taking any of these medicines: -acetaminophen -aspirin -ibuprofen -ketoprofen -naproxen This list may not describe all possible interactions. Give your health care provider a list of all the medicines, herbs, non-prescription drugs, or dietary supplements you use. Also tell them if you smoke, drink alcohol, or use illegal drugs. Some items may interact with your medicine. What should I watch for while using this medicine? Your condition will be monitored carefully while you are receiving this medicine. You will need important blood work done while you are taking this medicine. This drug may make you feel generally unwell. This is not  uncommon, as chemotherapy can affect healthy cells as well as cancer cells. Report any side effects. Continue your course of treatment even though you feel ill unless your doctor tells you to stop. In some cases, you may be given additional medicines to help with side effects. Follow all directions for their use. Call your doctor or health care professional for advice if you get a fever, chills or sore throat, or other symptoms of a cold or flu. Do not treat yourself. This drug decreases your body's ability to fight infections. Try to avoid being around people who are sick. This medicine may increase your risk to bruise or bleed. Call your doctor or health care professional if you notice any unusual bleeding. This medicine may contain alcohol in the product. You may get drowsy or dizzy. Do not drive, use machinery, or do anything that needs mental alertness until you know  how this medicine affects you. Do not stand or sit up quickly, especially if you are an older patient. This reduces the risk of dizzy or fainting spells. Avoid alcoholic drinks. Do not become pregnant while taking this medicine. Women should inform their doctor if they wish to become pregnant or think they might be pregnant. There is a potential for serious side effects to an unborn child. Talk to your health care professional or pharmacist for more information. Do not breast-feed an infant while taking this medicine. What side effects may I notice from receiving this medicine? Side effects that you should report to your doctor or health care professional as soon as possible: -allergic reactions like skin rash, itching or hives, swelling of the face, lips, or tongue -low blood counts - This drug may decrease the number of white blood cells, red blood cells and platelets. You may be at increased risk for infections and bleeding. -signs of infection - fever or chills, cough, sore throat, pain or difficulty passing urine -signs of decreased  platelets or bleeding - bruising, pinpoint red spots on the skin, black, tarry stools, nosebleeds -signs of decreased red blood cells - unusually weak or tired, fainting spells, lightheadedness -breathing problems -fast or irregular heartbeat -low blood pressure -mouth sores -nausea and vomiting -pain, swelling, redness or irritation at the injection site -pain, tingling, numbness in the hands or feet -swelling of the ankle, feet, hands -weight gain Side effects that usually do not require medical attention (report to your prescriber or health care professional if they continue or are bothersome): -bone pain -complete hair loss including hair on your head, underarms, pubic hair, eyebrows, and eyelashes -diarrhea -excessive tearing -changes in the color of fingernails -loosening of the fingernails -nausea -muscle pain -red flush to skin -sweating -weak or tired This list may not describe all possible side effects. Call your doctor for medical advice about side effects. You may report side effects to FDA at 1-800-FDA-1088. Where should I keep my medicine? This drug is given in a hospital or clinic and will not be stored at home. NOTE: This sheet is a summary. It may not cover all possible information. If you have questions about this medicine, talk to your doctor, pharmacist, or health care provider.    2016, Elsevier/Gold Standard. (2014-12-14 16:04:57)

## 2016-01-27 NOTE — Telephone Encounter (Signed)
Per staff message and POF I have scheduled appts. Advised scheduler of appts. JMW Per staff message and POF I have scheduled appts. Advised scheduler of appts. JMW  

## 2016-01-29 ENCOUNTER — Ambulatory Visit: Payer: BLUE CROSS/BLUE SHIELD

## 2016-01-31 ENCOUNTER — Telehealth: Payer: Self-pay | Admitting: *Deleted

## 2016-01-31 NOTE — Telephone Encounter (Signed)
Sandra James 'What is Dr. Ernestina Penna fax number.  I have a form to return to work."  Provided office fax number [phone].  No further questions.

## 2016-02-02 ENCOUNTER — Ambulatory Visit (HOSPITAL_BASED_OUTPATIENT_CLINIC_OR_DEPARTMENT_OTHER): Payer: BLUE CROSS/BLUE SHIELD

## 2016-02-02 ENCOUNTER — Encounter: Payer: Self-pay | Admitting: Nurse Practitioner

## 2016-02-02 ENCOUNTER — Ambulatory Visit (HOSPITAL_BASED_OUTPATIENT_CLINIC_OR_DEPARTMENT_OTHER): Payer: BLUE CROSS/BLUE SHIELD | Admitting: Nurse Practitioner

## 2016-02-02 ENCOUNTER — Other Ambulatory Visit (HOSPITAL_BASED_OUTPATIENT_CLINIC_OR_DEPARTMENT_OTHER): Payer: BLUE CROSS/BLUE SHIELD

## 2016-02-02 ENCOUNTER — Telehealth: Payer: Self-pay | Admitting: Nurse Practitioner

## 2016-02-02 ENCOUNTER — Telehealth: Payer: Self-pay | Admitting: *Deleted

## 2016-02-02 DIAGNOSIS — C50411 Malignant neoplasm of upper-outer quadrant of right female breast: Secondary | ICD-10-CM

## 2016-02-02 DIAGNOSIS — E860 Dehydration: Secondary | ICD-10-CM

## 2016-02-02 DIAGNOSIS — R197 Diarrhea, unspecified: Secondary | ICD-10-CM

## 2016-02-02 DIAGNOSIS — E876 Hypokalemia: Secondary | ICD-10-CM

## 2016-02-02 DIAGNOSIS — Z95828 Presence of other vascular implants and grafts: Secondary | ICD-10-CM

## 2016-02-02 DIAGNOSIS — R55 Syncope and collapse: Secondary | ICD-10-CM

## 2016-02-02 LAB — CBC WITH DIFFERENTIAL/PLATELET
BASO%: 0.4 % (ref 0.0–2.0)
Basophils Absolute: 0.1 10*3/uL (ref 0.0–0.1)
EOS%: 0.3 % (ref 0.0–7.0)
Eosinophils Absolute: 0 10*3/uL (ref 0.0–0.5)
HCT: 37.8 % (ref 34.8–46.6)
HEMOGLOBIN: 12.9 g/dL (ref 11.6–15.9)
LYMPH%: 13.7 % — AB (ref 14.0–49.7)
MCH: 30.1 pg (ref 25.1–34.0)
MCHC: 34.1 g/dL (ref 31.5–36.0)
MCV: 88.3 fL (ref 79.5–101.0)
MONO#: 2.2 10*3/uL — ABNORMAL HIGH (ref 0.1–0.9)
MONO%: 13.6 % (ref 0.0–14.0)
NEUT%: 72 % (ref 38.4–76.8)
NEUTROS ABS: 11.4 10*3/uL — AB (ref 1.5–6.5)
Platelets: 216 10*3/uL (ref 145–400)
RBC: 4.28 10*6/uL (ref 3.70–5.45)
RDW: 14 % (ref 11.2–14.5)
WBC: 15.9 10*3/uL — AB (ref 3.9–10.3)
lymph#: 2.2 10*3/uL (ref 0.9–3.3)

## 2016-02-02 LAB — COMPREHENSIVE METABOLIC PANEL
ALBUMIN: 4.4 g/dL (ref 3.5–5.0)
ALK PHOS: 134 U/L (ref 40–150)
ALT: 57 U/L — ABNORMAL HIGH (ref 0–55)
AST: 29 U/L (ref 5–34)
Anion Gap: 14 mEq/L — ABNORMAL HIGH (ref 3–11)
BUN: 7.8 mg/dL (ref 7.0–26.0)
CO2: 24 meq/L (ref 22–29)
Calcium: 9.8 mg/dL (ref 8.4–10.4)
Chloride: 102 mEq/L (ref 98–109)
Creatinine: 0.9 mg/dL (ref 0.6–1.1)
GLUCOSE: 119 mg/dL (ref 70–140)
POTASSIUM: 3.3 meq/L — AB (ref 3.5–5.1)
SODIUM: 139 meq/L (ref 136–145)
Total Bilirubin: 0.46 mg/dL (ref 0.20–1.20)
Total Protein: 8.5 g/dL — ABNORMAL HIGH (ref 6.4–8.3)

## 2016-02-02 MED ORDER — HEPARIN SOD (PORK) LOCK FLUSH 100 UNIT/ML IV SOLN
500.0000 [IU] | Freq: Once | INTRAVENOUS | Status: AC
  Administered 2016-02-02: 500 [IU] via INTRAVENOUS
  Filled 2016-02-02: qty 5

## 2016-02-02 MED ORDER — SODIUM CHLORIDE 0.9 % IV SOLN
INTRAVENOUS | Status: AC
  Administered 2016-02-02: 15:00:00 via INTRAVENOUS
  Filled 2016-02-02: qty 500

## 2016-02-02 MED ORDER — SODIUM CHLORIDE 0.9 % IV SOLN
INTRAVENOUS | Status: AC
  Administered 2016-02-02: 14:00:00 via INTRAVENOUS

## 2016-02-02 MED ORDER — SODIUM CHLORIDE 0.9% FLUSH
10.0000 mL | INTRAVENOUS | Status: DC | PRN
  Administered 2016-02-02 (×2): 10 mL via INTRAVENOUS
  Filled 2016-02-02: qty 10

## 2016-02-02 NOTE — Assessment & Plan Note (Signed)
Patient states that she experienced a syncopal episode last night while sitting on the toilet.  She states that she has been experiencing significant diarrhea episodes since yesterday morning; and feels dehydrated.  She states that she has a history of frequent syncopal events in the past as well.  Patient states that she hit her nose on the door; and she had a mild nosebleed following the syncopal event.  She denies hitting her head.  She denies any current neurological symptoms whatsoever.  On discussion with the patient regarding the need to avoid dehydration in the future.  Patient states that she will try pushing fluids in the future.  Also, patient was advised to call/return or go directly to the emergency department for any worsening symptoms whatsoever.

## 2016-02-02 NOTE — Telephone Encounter (Signed)
Voicemail: "I'm calling to report I fainted last night.  Please call [phone]"  Called Sandra James.  "I have fainted before.  When I received chemo I felt like I would faint.  At 0300 I was in the bathroom sitting on the toilet, fell off the toilet, hit my head on the door,  Nose bleeding.  I came too when I hit my head on the door but laid on the floor to get myself together.  My roommate on the other side came over and helped me clean myself up.  I do not know if it's from the diarrhea.  Stools are water and too many times to count.  Two watery stools so far since I woke up.  Imodium stops me up with bloating and gas.  Not eating, nothing tastes good.  Yesterday I drank a smoothie.  Stomach hurt yesterday.  I felt like I had the flu."  Temp checked with call = 98.6.  Denies stomach pain this morning.    TCH/Perjetta was received on 01-27-2016."  Will notify Dr. Burr Medico and instructed her to check her blood sugar.

## 2016-02-02 NOTE — Patient Instructions (Signed)

## 2016-02-02 NOTE — Assessment & Plan Note (Signed)
Patient's potassium is 3.3 today.  Most likely, hypokalemia is secondary to diarrhea and dehydration.  Patient will receive potassium 20 mg once in her IV today.  Also, confirmed the patient was called in potassium tablets last week; but patient states that she did not pick up the potassium tablets.  Patient was advised to take the potassium tablets starting tomorrow as directed.  Will continue to monitor closely.

## 2016-02-02 NOTE — Telephone Encounter (Signed)
Spoke to pt- she will come in at 65 for lab and to see [facility] after. POF and orders entered.

## 2016-02-02 NOTE — Assessment & Plan Note (Signed)
Patient received cycle 2 of her Taxotere/carboplatin/Herceptin/Perjeta chemotherapy on 01/27/2016.  She also received Neulasta after her chemotherapy.  Patient reports significant diarrhea episodes.  Following this last cycle of chemotherapy.  She states she also developed diarrhea following her for cycle of chemotherapy as well.  Blood counts obtained today revealed a WBC 15.9, ANC 11.4, hemoglobin 12.9, and platelet count 216.  Vital signs are stable today and temperature was 98.5.  Patient will receive IV fluid rehydration today.  Patient is scheduled to return for labs, flush, visit, and chemotherapy on 02/17/2016.

## 2016-02-02 NOTE — Telephone Encounter (Signed)
I will as [facility] call her back and come in to be seen today, I am concerned about her dehydration and she likely needs IVF.   Truitt Merle  02/02/2016

## 2016-02-02 NOTE — Assessment & Plan Note (Signed)
Patient states that she has been experiencing significant diarrhea episodes since yesterday.  She states that she developed diarrhea after first cycle of chemotherapy as well.  She is very reluctant to take Imodium; because she has a chronic history of constipation as well.  Patient states she has not taking any Imodium within the past 24 hours; secondary to her worries of constipation.  Patient was advised to try taking only one half of a tablet of Imodium to see if this helps with diarrhea.  Long discussion with patient regarding the need to find a balance between constipation and diarrhea issues.  Patient will receive IV fluid rehydration while at the cancer Center today.

## 2016-02-02 NOTE — Progress Notes (Signed)
SYMPTOM MANAGEMENT CLINIC   HPI: Sandra James 50 y.o. female diagnosed with breast cancer.  Currently undergoing Taxotere/carboplatin/Herceptin/Perjeta chemotherapy regimen.   Patient states that she has been experiencing significant diarrhea episodes since yesterday.  She states that she developed diarrhea after first cycle of chemotherapy as well.  She is very reluctant to take Imodium; because she has a chronic history of constipation as well.  Also, patient had a syncopal event last night; that she feels is secondary to her dehydration.  Patient states she has a history of chronic/frequent syncopy events.  Patient states she has not taking any Imodium within the past 24 hours; secondary to her worries of constipation.  Patient was advised to try taking only one half of a tablet of Imodium to see if this helps with diarrhea.  Long discussion with patient regarding the need to find a balance between constipation and diarrhea issues.  Patient will receive IV fluid rehydration while at the cancer Center today.  HPI  Review of Systems  Constitutional: Positive for malaise/fatigue. Negative for fever and chills.  Gastrointestinal: Positive for abdominal pain and diarrhea. Negative for nausea, vomiting, blood in stool and melena.  All other systems reviewed and are negative.   Past Medical History  Diagnosis Date  . MVP (mitral valve prolapse)   . Infection     UTI  . Anxiety   . Fibroid   . Cancer [facility])     left brast lumpectomy   . Breast cancer of upper-outer quadrant of right female breast ([place]) 09/09/2015  . Diabetes mellitus without complication ([place])   . Depression   . GERD (gastroesophageal reflux disease)   . Complication of anesthesia     slow to wake up- BP was low, fainted next day-  . Breast cancer ([place]) 09/07/15    right breast  . Allergy   . UTI (lower urinary tract infection)   . Neuromuscular disorder ([place])     sciatic nerve paion left side/leg    Past  Surgical History  Procedure Laterality Date  . Abdominal hysterectomy    . Breast surgery  2004    left lumpectomy  . Oophorectomy Bilateral   . Mastectomy w/ sentinel node biopsy Bilateral 11/25/2015    Procedure: BILATERAL SKIN SPARING MASTECTOMIES WITH RIGHT SENTINEL LYMPH NODE BIOPSY;  Surgeon: Stark Klein, MD;  Location: [place];  Service: General;  Laterality: Bilateral;  . Breast reconstruction with placement of tissue expander and flex hd (acellular hydrated dermis) Bilateral 11/25/2015    Procedure: BILATERAL IMMEDIATE BREAST RECONSTRUCTION WITH PLACEMENT OF TISSUE EXPANDERS AND FLEX HD (ACELLULAR HYDRATED DERMIS);  Surgeon: Wallace Going, DO;  Location: [place];  Service: Plastics;  Laterality: Bilateral;  . Portacath placement Left 12/20/2015    Procedure: INSERTION PORT-A-CATH, left chest;  Surgeon: Stark Klein, MD;  Location: [place];  Service: General;  Laterality: Left;    has Breast cancer of upper-outer quadrant of right female breast ([place]); History of left breast cancer; Family history of breast cancer in sister; Family history of colon cancer; Genetic testing; Diarrhea; Dehydration; Hypokalemia; and Syncope on her problem list.    is allergic to lisinopril; oxycodone; and percocet.    Medication List       This list is accurate as of: 02/02/16  4:46 PM.  Always use your most recent med list.               dexamethasone 4 MG tablet  Commonly known as:  DECADRON  Take 2 tablets (8 mg total) by mouth 2 (two) times daily. Start the day before Taxotere. Then again the day after chemo for 3 days.     diazepam 2 MG tablet  Commonly known as:  VALIUM  Take 2 mg by mouth every 6 (six) hours as needed for muscle spasms. Reported on 12/22/2015     HYDROcodone-acetaminophen 5-325 MG tablet  Commonly known as:  NORCO/VICODIN  Take 1 tablet by mouth every 6 (six) hours as needed for moderate pain.     lidocaine-prilocaine cream    Commonly known as:  EMLA  Apply to affected area once     magic mouthwash Soln  Take 5 mLs by mouth 4 (four) times daily as needed for mouth pain.     metFORMIN 500 MG tablet  Commonly known as:  GLUCOPHAGE  Take 500 mg by mouth 2 (two) times daily with a meal. Reported on 12/22/2015     mirtazapine 30 MG tablet  Commonly known as:  REMERON  Take 1 tablet (30 mg total) by mouth at bedtime.     omeprazole 20 MG capsule  Commonly known as:  PRILOSEC  Take 1 capsule (20 mg total) by mouth daily.     ondansetron 8 MG tablet  Commonly known as:  ZOFRAN  Take 1 tablet (8 mg total) by mouth 2 (two) times daily. Start the day after chemo for 3 days. Then take as needed for nausea or vomiting.     polyethylene glycol packet  Commonly known as:  MIRALAX / GLYCOLAX  Take 17 g by mouth daily as needed for mild constipation.     potassium chloride SA 20 MEQ tablet  Commonly known as:  K-DUR,KLOR-CON  Take 1 tablet (20 mEq total) by mouth daily.     prochlorperazine 10 MG tablet  Commonly known as:  COMPAZINE  Take 1 tablet (10 mg total) by mouth every 6 (six) hours as needed (Nausea or vomiting).         PHYSICAL EXAMINATION  Oncology Vitals 01/27/2016 01/14/2016  Height 168 cm 168 cm  Weight 71.442 kg 69.31 kg  Weight (lbs) 157 lbs 8 oz 152 lbs 13 oz  BMI (kg/m2) 25.42 kg/m2 24.66 kg/m2  Temp 98.4 98.7  Pulse 18 96  Resp 18 18  SpO2 99 98  BSA (m2) 1.82 m2 1.8 m2   BP Readings from Last 2 Encounters:  01/27/16 128/81  01/14/16 116/77    Physical Exam  Constitutional: She is oriented to person, place, and time and well-developed, well-nourished, and in no distress.  HENT:  Head: Normocephalic and atraumatic.  Eyes: Conjunctivae and EOM are normal. Pupils are equal, round, and reactive to light. Right eye exhibits no discharge. Left eye exhibits no discharge. No scleral icterus.  Neck: Normal range of motion.  Pulmonary/Chest: Effort normal. No respiratory distress.   Musculoskeletal: Normal range of motion.  Neurological: She is alert and oriented to person, place, and time. Gait normal.  Psychiatric: Affect normal.    LABORATORY DATA:. Appointment on 02/02/2016  Component Date Value Ref Range Status  . WBC 02/02/2016 15.9* 3.9 - 10.3 10e3/uL Final  . NEUT# 02/02/2016 11.4* 1.5 - 6.5 10e3/uL Final  . HGB 02/02/2016 12.9  11.6 - 15.9 g/dL Final  . HCT 02/02/2016 37.8  34.8 - 46.6 % Final  . Platelets 02/02/2016 216  145 - 400 10e3/uL Final  . MCV 02/02/2016 88.3  79.5 - 101.0 fL Final  . MCH 02/02/2016 30.1  25.1 - 34.0 pg Final  . MCHC 02/02/2016 34.1  31.5 - 36.0 g/dL Final  . RBC 02/02/2016 4.28  3.70 - 5.45 10e6/uL Final  . RDW 02/02/2016 14.0  11.2 - 14.5 % Final  . lymph# 02/02/2016 2.2  0.9 - 3.3 10e3/uL Final  . MONO# 02/02/2016 2.2* 0.1 - 0.9 10e3/uL Final  . Eosinophils Absolute 02/02/2016 0.0  0.0 - 0.5 10e3/uL Final  . Basophils Absolute 02/02/2016 0.1  0.0 - 0.1 10e3/uL Final  . NEUT% 02/02/2016 72.0  38.4 - 76.8 % Final  . LYMPH% 02/02/2016 13.7* 14.0 - 49.7 % Final  . MONO% 02/02/2016 13.6  0.0 - 14.0 % Final  . EOS% 02/02/2016 0.3  0.0 - 7.0 % Final  . BASO% 02/02/2016 0.4  0.0 - 2.0 % Final  . Sodium 02/02/2016 139  136 - 145 mEq/L Final  . Potassium 02/02/2016 3.3* 3.5 - 5.1 mEq/L Final  . Chloride 02/02/2016 102  98 - 109 mEq/L Final  . CO2 02/02/2016 24  22 - 29 mEq/L Final  . Glucose 02/02/2016 119  70 - 140 mg/dl Final   Glucose reference range is for nonfasting patients. Fasting glucose reference range is 70- 100.  Marland Kitchen BUN 02/02/2016 7.8  7.0 - 26.0 mg/dL Final  . Creatinine 02/02/2016 0.9  0.6 - 1.1 mg/dL Final  . Total Bilirubin 02/02/2016 0.46  0.20 - 1.20 mg/dL Final  . Alkaline Phosphatase 02/02/2016 134  40 - 150 U/L Final  . AST 02/02/2016 29  5 - 34 U/L Final  . ALT 02/02/2016 57* 0 - 55 U/L Final  . Total Protein 02/02/2016 8.5* 6.4 - 8.3 g/dL Final  . Albumin 02/02/2016 4.4  3.5 - 5.0 g/dL Final  . Calcium  02/02/2016 9.8  8.4 - 10.4 mg/dL Final  . Anion Gap 02/02/2016 14* 3 - 11 mEq/L Final  . EGFR 02/02/2016 >90  >90 ml/min/1.73 m2 Final   eGFR is calculated using the CKD-EPI Creatinine Equation (2009)     RADIOGRAPHIC STUDIES: No results found.  ASSESSMENT/PLAN:    Hypokalemia Patient's potassium is 3.3 today.  Most likely, hypokalemia is secondary to diarrhea and dehydration.  Patient will receive potassium 20 mg once in her IV today.  Also, confirmed the patient was called in potassium tablets last week; but patient states that she did not pick up the potassium tablets.  Patient was advised to take the potassium tablets starting tomorrow as directed.  Will continue to monitor closely.  Diarrhea Patient states that she has been experiencing significant diarrhea episodes since yesterday.  She states that she developed diarrhea after first cycle of chemotherapy as well.  She is very reluctant to take Imodium; because she has a chronic history of constipation as well.  Patient states she has not taking any Imodium within the past 24 hours; secondary to her worries of constipation.  Patient was advised to try taking only one half of a tablet of Imodium to see if this helps with diarrhea.  Long discussion with patient regarding the need to find a balance between constipation and diarrhea issues.  Patient will receive IV fluid rehydration while at the cancer Center today.  Dehydration Patient states that she has been experiencing significant diarrhea episodes since yesterday.  She states that she developed diarrhea after first cycle of chemotherapy as well.  She is very reluctant to take Imodium; because she has a chronic history of constipation as well.  Patient states she has not taking any Imodium within the  past 24 hours; secondary to her worries of constipation.  Patient was advised to try taking only one half of a tablet of Imodium to see if this helps with diarrhea.  Long discussion  with patient regarding the need to find a balance between constipation and diarrhea issues.  Patient will receive IV fluid rehydration while at the cancer Center today.  Breast cancer of upper-outer quadrant of right female breast [facility]) Patient received cycle 2 of her Taxotere/carboplatin/Herceptin/Perjeta chemotherapy on 01/27/2016.  She also received Neulasta after her chemotherapy.  Patient reports significant diarrhea episodes.  Following this last cycle of chemotherapy.  She states she also developed diarrhea following her for cycle of chemotherapy as well.  Blood counts obtained today revealed a WBC 15.9, ANC 11.4, hemoglobin 12.9, and platelet count 216.  Vital signs are stable today and temperature was 98.5.  Patient will receive IV fluid rehydration today.  Patient is scheduled to return for labs, flush, visit, and chemotherapy on 02/17/2016.  Syncope Patient states that she experienced a syncopal episode last night while sitting on the toilet.  She states that she has been experiencing significant diarrhea episodes since yesterday morning; and feels dehydrated.  She states that she has a history of frequent syncopal events in the past as well.  Patient states that she hit her nose on the door; and she had a mild nosebleed following the syncopal event.  She denies hitting her head.  She denies any current neurological symptoms whatsoever.  On discussion with the patient regarding the need to avoid dehydration in the future.  Patient states that she will try pushing fluids in the future.  Also, patient was advised to call/return or go directly to the emergency department for any worsening symptoms whatsoever.  Patient stated understanding of all instructions; and was in agreement with this plan of care. The patient knows to call the clinic with any problems, questions or concerns.   Review/collaboration with Dr. Burr Medico regarding all aspects of patient's visit today.   Total time  spent with patient was 25 minutes;  with greater than 75 percent of that time spent in face to face counseling regarding patient's symptoms,  and coordination of care and follow up.  Disclaimer:This dictation was prepared with Dragon/digital dictation along with [employer]. Any transcriptional errors that result from this process are unintentional.  Drue Second, NP 02/02/2016

## 2016-02-02 NOTE — Telephone Encounter (Signed)
per pof to sch pt appt @ 1 w/sym mgmt-[facility] already has 1 sch pt @ 1:45 lab @12 :30-pt aware

## 2016-02-03 MED FILL — FLUTICASONE PROP 50 MCG SPR: 50 | 30 days supply | Qty: 16 | Fill #0

## 2016-02-07 ENCOUNTER — Encounter: Payer: Self-pay | Admitting: Hematology

## 2016-02-07 ENCOUNTER — Telehealth: Payer: Self-pay | Admitting: *Deleted

## 2016-02-07 NOTE — Progress Notes (Signed)
fmla forms left in box. Dr Burr Medico is out of office this week.

## 2016-02-07 NOTE — Telephone Encounter (Signed)
Called pt to follow up from [facility] visit on 2/22- Pt phone does not have a VM set up- unable to LM for rtn call.

## 2016-02-09 ENCOUNTER — Ambulatory Visit: Payer: BLUE CROSS/BLUE SHIELD | Admitting: Cardiovascular Disease

## 2016-02-11 ENCOUNTER — Telehealth: Payer: Self-pay | Admitting: *Deleted

## 2016-02-11 ENCOUNTER — Ambulatory Visit (HOSPITAL_BASED_OUTPATIENT_CLINIC_OR_DEPARTMENT_OTHER): Payer: BLUE CROSS/BLUE SHIELD | Admitting: Nurse Practitioner

## 2016-02-11 ENCOUNTER — Telehealth: Payer: Self-pay | Admitting: Nurse Practitioner

## 2016-02-11 ENCOUNTER — Encounter: Payer: Self-pay | Admitting: Nurse Practitioner

## 2016-02-11 VITALS — BP 119/79 | HR 95 | Temp 97.6°F | Resp 18 | Ht 66.0 in | Wt 155.9 lb

## 2016-02-11 DIAGNOSIS — L309 Dermatitis, unspecified: Secondary | ICD-10-CM

## 2016-02-11 DIAGNOSIS — C50411 Malignant neoplasm of upper-outer quadrant of right female breast: Secondary | ICD-10-CM | POA: Diagnosis not present

## 2016-02-11 NOTE — Progress Notes (Signed)
SYMPTOM MANAGEMENT CLINIC   HPI: Sandra James 50 y.o. female diagnosed with breast cancer.  Currently undergoing Taxotere/carboplatin/Herceptin/Perjeta chemotherapy regimen.  Patient reports mild skin rash with pruritus for the past few days.  She states she's also noted some areas that are peeling to her skin; despite applying coconut oil on a very regular basis.  She denies any pain to the rash.  Patient denies any new medications, lotions, shampoos, etc.  Patient states that she plans to ask her roommate if she has changed the laundry detergent.  Exam today reveals no obvious rash whatsoever; but some very dry skin.  She also has some areas that actually appear to be peeling to her chest wall as well.  There is no evidence of infection.  Advised patient that she could try the Aquaphor ointment; as well as over-the-counter topical hydrocortisone cream.  Patient was also given instructions for Benadryl and Pepcid to try to see if this helps with the itching as well.  Patient was advised to call/return or go directly to the emergency department for any worsening symptoms whatsoever.   Rash    Review of Systems  Skin: Positive for itching and rash.  All other systems reviewed and are negative.   Past Medical History  Diagnosis Date  . MVP (mitral valve prolapse)   . Infection     UTI  . Anxiety   . Fibroid   . Cancer [facility])     left brast lumpectomy   . Breast cancer of upper-outer quadrant of right female breast ([place]) 09/09/2015  . Diabetes mellitus without complication ([place])   . Depression   . GERD (gastroesophageal reflux disease)   . Complication of anesthesia     slow to wake up- BP was low, fainted next day-  . Breast cancer ([place]) 09/07/15    right breast  . Allergy   . UTI (lower urinary tract infection)   . Neuromuscular disorder ([place])     sciatic nerve paion left side/leg    Past Surgical History  Procedure Laterality Date  . Abdominal hysterectomy      . Breast surgery  2004    left lumpectomy  . Oophorectomy Bilateral   . Mastectomy w/ sentinel node biopsy Bilateral 11/25/2015    Procedure: BILATERAL SKIN SPARING MASTECTOMIES WITH RIGHT SENTINEL LYMPH NODE BIOPSY;  Surgeon: Stark Klein, MD;  Location: [place];  Service: General;  Laterality: Bilateral;  . Breast reconstruction with placement of tissue expander and flex hd (acellular hydrated dermis) Bilateral 11/25/2015    Procedure: BILATERAL IMMEDIATE BREAST RECONSTRUCTION WITH PLACEMENT OF TISSUE EXPANDERS AND FLEX HD (ACELLULAR HYDRATED DERMIS);  Surgeon: Wallace Going, DO;  Location: [place];  Service: Plastics;  Laterality: Bilateral;  . Portacath placement Left 12/20/2015    Procedure: INSERTION PORT-A-CATH, left chest;  Surgeon: Stark Klein, MD;  Location: [place];  Service: General;  Laterality: Left;    has Breast cancer of upper-outer quadrant of right female breast ([place]); History of left breast cancer; Family history of breast cancer in sister; Family history of colon cancer; Genetic testing; Diarrhea; Dehydration; Hypokalemia; Syncope; and Dermatitis on her problem list.    is allergic to lisinopril; oxycodone; and percocet.    Medication List       This list is accurate as of: 02/11/16  3:51 PM.  Always use your most recent med list.               dexamethasone 4 MG tablet  Commonly known as:  DECADRON  Take 2 tablets (8 mg total) by mouth 2 (two) times daily. Start the day before Taxotere. Then again the day after chemo for 3 days.     diazepam 2 MG tablet  Commonly known as:  VALIUM  Take 2 mg by mouth every 6 (six) hours as needed for muscle spasms. Reported on 12/22/2015     HYDROcodone-acetaminophen 5-325 MG tablet  Commonly known as:  NORCO/VICODIN  Take 1 tablet by mouth every 6 (six) hours as needed for moderate pain.     lidocaine-prilocaine cream  Commonly known as:  EMLA  Apply to affected area once     magic  mouthwash Soln  Take 5 mLs by mouth 4 (four) times daily as needed for mouth pain.     metFORMIN 500 MG tablet  Commonly known as:  GLUCOPHAGE  Take 500 mg by mouth 2 (two) times daily with a meal. Reported on 12/22/2015     mirtazapine 30 MG tablet  Commonly known as:  REMERON  Take 1 tablet (30 mg total) by mouth at bedtime.     omeprazole 20 MG capsule  Commonly known as:  PRILOSEC  Take 1 capsule (20 mg total) by mouth daily.     ondansetron 8 MG tablet  Commonly known as:  ZOFRAN  Take 1 tablet (8 mg total) by mouth 2 (two) times daily. Start the day after chemo for 3 days. Then take as needed for nausea or vomiting.     polyethylene glycol packet  Commonly known as:  MIRALAX / GLYCOLAX  Take 17 g by mouth daily as needed for mild constipation.     potassium chloride SA 20 MEQ tablet  Commonly known as:  K-DUR,KLOR-CON  Take 1 tablet (20 mEq total) by mouth daily.     prochlorperazine 10 MG tablet  Commonly known as:  COMPAZINE  Take 1 tablet (10 mg total) by mouth every 6 (six) hours as needed (Nausea or vomiting).         PHYSICAL EXAMINATION  Oncology Vitals 02/11/2016 02/02/2016  Height 168 cm -  Weight 70.716 kg -  Weight (lbs) 155 lbs 14 oz -  BMI (kg/m2) 25.16 kg/m2 -  Temp 97.6 -  Pulse 95 110  Resp 18 18  SpO2 100 -  BSA (m2) 1.81 m2 -   BP Readings from Last 2 Encounters:  02/11/16 119/79  02/02/16 115/77    Physical Exam  Constitutional: She is oriented to person, place, and time and well-developed, well-nourished, and in no distress.  HENT:  Head: Normocephalic and atraumatic.  Eyes: Conjunctivae and EOM are normal. Pupils are equal, round, and reactive to light. Right eye exhibits no discharge. Left eye exhibits no discharge. No scleral icterus.  Neck: Normal range of motion.  Pulmonary/Chest: Effort normal. No respiratory distress.  Musculoskeletal: Normal range of motion.  Neurological: She is alert and oriented to person, place, and time.  Gait normal.  Skin: Skin is warm and dry. Rash noted. No erythema. No pallor.  No obvious rash noted on exam.  Patient does appear to have some very dry skin with some areas of peeling as well.  Psychiatric: Affect normal.    LABORATORY DATA:. No visits with results within 3 Day(s) from this visit. Latest known visit with results is:  Appointment on 02/02/2016  Component Date Value Ref Range Status  . WBC 02/02/2016 15.9* 3.9 - 10.3 10e3/uL Final  . NEUT# 02/02/2016 11.4* 1.5 - 6.5 10e3/uL Final  .  HGB 02/02/2016 12.9  11.6 - 15.9 g/dL Final  . HCT 02/02/2016 37.8  34.8 - 46.6 % Final  . Platelets 02/02/2016 216  145 - 400 10e3/uL Final  . MCV 02/02/2016 88.3  79.5 - 101.0 fL Final  . MCH 02/02/2016 30.1  25.1 - 34.0 pg Final  . MCHC 02/02/2016 34.1  31.5 - 36.0 g/dL Final  . RBC 02/02/2016 4.28  3.70 - 5.45 10e6/uL Final  . RDW 02/02/2016 14.0  11.2 - 14.5 % Final  . lymph# 02/02/2016 2.2  0.9 - 3.3 10e3/uL Final  . MONO# 02/02/2016 2.2* 0.1 - 0.9 10e3/uL Final  . Eosinophils Absolute 02/02/2016 0.0  0.0 - 0.5 10e3/uL Final  . Basophils Absolute 02/02/2016 0.1  0.0 - 0.1 10e3/uL Final  . NEUT% 02/02/2016 72.0  38.4 - 76.8 % Final  . LYMPH% 02/02/2016 13.7* 14.0 - 49.7 % Final  . MONO% 02/02/2016 13.6  0.0 - 14.0 % Final  . EOS% 02/02/2016 0.3  0.0 - 7.0 % Final  . BASO% 02/02/2016 0.4  0.0 - 2.0 % Final  . Sodium 02/02/2016 139  136 - 145 mEq/L Final  . Potassium 02/02/2016 3.3* 3.5 - 5.1 mEq/L Final  . Chloride 02/02/2016 102  98 - 109 mEq/L Final  . CO2 02/02/2016 24  22 - 29 mEq/L Final  . Glucose 02/02/2016 119  70 - 140 mg/dl Final   Glucose reference range is for nonfasting patients. Fasting glucose reference range is 70- 100.  Marland Kitchen BUN 02/02/2016 7.8  7.0 - 26.0 mg/dL Final  . Creatinine 02/02/2016 0.9  0.6 - 1.1 mg/dL Final  . Total Bilirubin 02/02/2016 0.46  0.20 - 1.20 mg/dL Final  . Alkaline Phosphatase 02/02/2016 134  40 - 150 U/L Final  . AST 02/02/2016 29  5 - 34  U/L Final  . ALT 02/02/2016 57* 0 - 55 U/L Final  . Total Protein 02/02/2016 8.5* 6.4 - 8.3 g/dL Final  . Albumin 02/02/2016 4.4  3.5 - 5.0 g/dL Final  . Calcium 02/02/2016 9.8  8.4 - 10.4 mg/dL Final  . Anion Gap 02/02/2016 14* 3 - 11 mEq/L Final  . EGFR 02/02/2016 >90  >90 ml/min/1.73 m2 Final   eGFR is calculated using the CKD-EPI Creatinine Equation (2009)     RADIOGRAPHIC STUDIES: No results found.  ASSESSMENT/PLAN:    Dermatitis Patient reports mild skin rash with pruritus for the past few days.  She states she's also noted some areas that are peeling to her skin; despite applying coconut oil on a very regular basis.  She denies any pain to the rash.  Patient denies any new medications, lotions, shampoos, etc.  Patient states that she plans to ask her roommate if she has changed the laundry detergent.  Exam today reveals no obvious rash whatsoever; but some very dry skin.  She also has some areas that actually appear to be peeling to her chest wall as well.  There is no evidence of infection.  Advised patient that she could try the Aquaphor ointment; as well as over-the-counter topical hydrocortisone cream.  Patient was also given instructions for Benadryl and Pepcid to try to see if this helps with the itching as well.  Patient was advised to call/return or go directly to the emergency department for any worsening symptoms whatsoever.  Breast cancer of upper-outer quadrant of right female breast [facility]) Patient received cycle 2 of her Taxotere/carboplatin/Herceptin/Perjeta chemotherapy on 01/27/2016.  She also received Neulasta after her chemotherapy.  Patient asked if  she could take black seed oil as a supplement.  Reviewed black seed oil; which is also called Black cumin-and patient will be advised that she should avoid taking this supplement because it can decrease both leukocytes and platelets-per [place] website.  Patient is scheduled to return  for labs, flush, visit, and chemotherapy on 02/17/2016.    Patient stated understanding of all instructions; and was in agreement with this plan of care. The patient knows to call the clinic with any problems, questions or concerns.   Review/collaboration with Dr. Burr Medico regarding all aspects of patient's visit today.   Total time spent with patient was 25 minutes;  with greater than 75 percent of that time spent in face to face counseling regarding patient's symptoms,  and coordination of care and follow up.  Disclaimer:This dictation was prepared with Dragon/digital dictation along with [employer]. Any transcriptional errors that result from this process are unintentional.  Drue Second, NP 02/11/2016

## 2016-02-11 NOTE — Assessment & Plan Note (Signed)
Patient reports mild skin rash with pruritus for the past few days.  She states she's also noted some areas that are peeling to her skin; despite applying coconut oil on a very regular basis.  She denies any pain to the rash.  Patient denies any new medications, lotions, shampoos, etc.  Patient states that she plans to ask her roommate if she has changed the laundry detergent.  Exam today reveals no obvious rash whatsoever; but some very dry skin.  She also has some areas that actually appear to be peeling to her chest wall as well.  There is no evidence of infection.  Advised patient that she could try the Aquaphor ointment; as well as over-the-counter topical hydrocortisone cream.  Patient was also given instructions for Benadryl and Pepcid to try to see if this helps with the itching as well.  Patient was advised to call/return or go directly to the emergency department for any worsening symptoms whatsoever.

## 2016-02-11 NOTE — Telephone Encounter (Signed)
TC to pt- [employer] has not been recommended for pt to supplement. This herbal supplement can cause a decreased platelet count and aggravate dermatological symptoms. Pt verbalized an understanding.

## 2016-02-11 NOTE — Telephone Encounter (Signed)
Pt called and left message requesting a call back from nurse.  Spoke with pt and was informed re: At the end of last week, pt noticed a red ring from Neulasta onpro site; but now red rash has spread to both sides.  Stated some stretch marks noted on all over back.  Denied opened  or draining rash, just itching a lot.  Pt has been using coconut oil, and cocoa buttler lotion but not helping much. Jenny Reichmann, NP symptom management notified.   Instructed pt to come in today for evaluation by NP.  Gave pt appt at 1 pm as per pt's request. Pt's   Phone    [phone].

## 2016-02-11 NOTE — Telephone Encounter (Signed)
"  I spoke to someone about coming in.  My car is in the shop but I have a ride and come in at 10:30.  Is that okay?"  [facility] notified.  Patient can come, wait to be worked in or scheduled at 11:30 per [facility]. "I have a ride so I'll just come in.  Waiting is okay."

## 2016-02-11 NOTE — Telephone Encounter (Signed)
per pof to per [place] for pt to follow appts as sch

## 2016-02-11 NOTE — Assessment & Plan Note (Signed)
Patient received cycle 2 of her Taxotere/carboplatin/Herceptin/Perjeta chemotherapy on 01/27/2016.  She also received Neulasta after her chemotherapy.  Patient asked if she could take black seed oil as a supplement.  Reviewed black seed oil; which is also called Black cumin-and patient will be advised that she should avoid taking this supplement because it can decrease both leukocytes and platelets-per [place] website.  Patient is scheduled to return for labs, flush, visit, and chemotherapy on 02/17/2016.

## 2016-02-14 MED FILL — MIRTAZAPINE 30 MG TABLET: 30 | 30 days supply | Qty: 30 | Fill #1

## 2016-02-14 NOTE — Telephone Encounter (Signed)
Progress Notes   Milda Pham (MR# [id number])      Progress Notes Info    Author Note Status Last Update User Last Update Date/Time   Zaylister Andris Baumann Signed Mariam Dollar 02/07/2016 1:58 PM    Progress Notes    Expand All Collapse All   fmla forms left in box. Dr Burr Medico is out of office this week.

## 2016-02-16 ENCOUNTER — Encounter: Payer: Self-pay | Admitting: Hematology

## 2016-02-16 NOTE — Progress Notes (Signed)
Faxed signed form and left copy for patient

## 2016-02-17 ENCOUNTER — Encounter: Payer: Self-pay | Admitting: Hematology

## 2016-02-17 ENCOUNTER — Ambulatory Visit (HOSPITAL_BASED_OUTPATIENT_CLINIC_OR_DEPARTMENT_OTHER): Payer: BLUE CROSS/BLUE SHIELD

## 2016-02-17 ENCOUNTER — Ambulatory Visit (HOSPITAL_BASED_OUTPATIENT_CLINIC_OR_DEPARTMENT_OTHER): Payer: BLUE CROSS/BLUE SHIELD | Admitting: Hematology

## 2016-02-17 ENCOUNTER — Telehealth: Payer: Self-pay | Admitting: Hematology

## 2016-02-17 ENCOUNTER — Other Ambulatory Visit (HOSPITAL_BASED_OUTPATIENT_CLINIC_OR_DEPARTMENT_OTHER): Payer: BLUE CROSS/BLUE SHIELD

## 2016-02-17 ENCOUNTER — Ambulatory Visit: Payer: BLUE CROSS/BLUE SHIELD

## 2016-02-17 VITALS — BP 115/82 | HR 100 | Temp 98.6°F | Resp 18 | Ht 66.0 in | Wt 158.0 lb

## 2016-02-17 DIAGNOSIS — Z5112 Encounter for antineoplastic immunotherapy: Secondary | ICD-10-CM | POA: Diagnosis not present

## 2016-02-17 DIAGNOSIS — R197 Diarrhea, unspecified: Secondary | ICD-10-CM | POA: Diagnosis not present

## 2016-02-17 DIAGNOSIS — E119 Type 2 diabetes mellitus without complications: Secondary | ICD-10-CM | POA: Diagnosis not present

## 2016-02-17 DIAGNOSIS — G4700 Insomnia, unspecified: Secondary | ICD-10-CM

## 2016-02-17 DIAGNOSIS — L309 Dermatitis, unspecified: Secondary | ICD-10-CM | POA: Diagnosis not present

## 2016-02-17 DIAGNOSIS — Z95828 Presence of other vascular implants and grafts: Secondary | ICD-10-CM

## 2016-02-17 DIAGNOSIS — Z5189 Encounter for other specified aftercare: Secondary | ICD-10-CM | POA: Diagnosis not present

## 2016-02-17 DIAGNOSIS — C50411 Malignant neoplasm of upper-outer quadrant of right female breast: Secondary | ICD-10-CM | POA: Diagnosis not present

## 2016-02-17 DIAGNOSIS — Z853 Personal history of malignant neoplasm of breast: Secondary | ICD-10-CM

## 2016-02-17 DIAGNOSIS — Z5111 Encounter for antineoplastic chemotherapy: Secondary | ICD-10-CM | POA: Diagnosis not present

## 2016-02-17 DIAGNOSIS — M8588 Other specified disorders of bone density and structure, other site: Secondary | ICD-10-CM

## 2016-02-17 LAB — CBC WITH DIFFERENTIAL/PLATELET
BASO%: 0.7 % (ref 0.0–2.0)
BASOS ABS: 0.1 10*3/uL (ref 0.0–0.1)
EOS ABS: 0 10*3/uL (ref 0.0–0.5)
EOS%: 0.3 % (ref 0.0–7.0)
HCT: 34.7 % — ABNORMAL LOW (ref 34.8–46.6)
HEMOGLOBIN: 11.6 g/dL (ref 11.6–15.9)
LYMPH%: 24.4 % (ref 14.0–49.7)
MCH: 30.2 pg (ref 25.1–34.0)
MCHC: 33.4 g/dL (ref 31.5–36.0)
MCV: 90.4 fL (ref 79.5–101.0)
MONO#: 0.7 10*3/uL (ref 0.1–0.9)
MONO%: 9.4 % (ref 0.0–14.0)
NEUT%: 65.2 % (ref 38.4–76.8)
NEUTROS ABS: 4.8 10*3/uL (ref 1.5–6.5)
PLATELETS: 291 10*3/uL (ref 145–400)
RBC: 3.84 10*6/uL (ref 3.70–5.45)
RDW: 15.5 % — AB (ref 11.2–14.5)
WBC: 7.3 10*3/uL (ref 3.9–10.3)
lymph#: 1.8 10*3/uL (ref 0.9–3.3)

## 2016-02-17 LAB — COMPREHENSIVE METABOLIC PANEL
ALBUMIN: 3.8 g/dL (ref 3.5–5.0)
ALK PHOS: 99 U/L (ref 40–150)
ALT: 28 U/L (ref 0–55)
ANION GAP: 9 meq/L (ref 3–11)
AST: 16 U/L (ref 5–34)
BILIRUBIN TOTAL: 0.38 mg/dL (ref 0.20–1.20)
BUN: 7.5 mg/dL (ref 7.0–26.0)
CALCIUM: 9.1 mg/dL (ref 8.4–10.4)
CO2: 27 mEq/L (ref 22–29)
Chloride: 106 mEq/L (ref 98–109)
Creatinine: 0.8 mg/dL (ref 0.6–1.1)
GLUCOSE: 123 mg/dL (ref 70–140)
POTASSIUM: 3.4 meq/L — AB (ref 3.5–5.1)
SODIUM: 141 meq/L (ref 136–145)
TOTAL PROTEIN: 7.4 g/dL (ref 6.4–8.3)

## 2016-02-17 MED ORDER — DIPHENHYDRAMINE HCL 25 MG PO CAPS
50.0000 mg | ORAL_CAPSULE | Freq: Once | ORAL | Status: AC
  Administered 2016-02-17: 50 mg via ORAL

## 2016-02-17 MED ORDER — SODIUM CHLORIDE 0.9 % IV SOLN
711.6000 mg | Freq: Once | INTRAVENOUS | Status: AC
  Administered 2016-02-17: 710 mg via INTRAVENOUS
  Filled 2016-02-17: qty 71

## 2016-02-17 MED ORDER — PALONOSETRON HCL INJECTION 0.25 MG/5ML
0.2500 mg | Freq: Once | INTRAVENOUS | Status: AC
  Administered 2016-02-17: 0.25 mg via INTRAVENOUS

## 2016-02-17 MED ORDER — SODIUM CHLORIDE 0.9% FLUSH
10.0000 mL | INTRAVENOUS | Status: DC | PRN
  Administered 2016-02-17: 10 mL via INTRAVENOUS
  Filled 2016-02-17: qty 10

## 2016-02-17 MED ORDER — SODIUM CHLORIDE 0.9 % IV SOLN
75.0000 mg/m2 | Freq: Once | INTRAVENOUS | Status: AC
  Administered 2016-02-17: 130 mg via INTRAVENOUS
  Filled 2016-02-17: qty 13

## 2016-02-17 MED ORDER — SODIUM CHLORIDE 0.9 % IV SOLN
Freq: Once | INTRAVENOUS | Status: AC
  Administered 2016-02-17: 10:00:00 via INTRAVENOUS

## 2016-02-17 MED ORDER — HEPARIN SOD (PORK) LOCK FLUSH 100 UNIT/ML IV SOLN
500.0000 [IU] | Freq: Once | INTRAVENOUS | Status: AC | PRN
  Administered 2016-02-17: 500 [IU]
  Filled 2016-02-17: qty 5

## 2016-02-17 MED ORDER — SODIUM CHLORIDE 0.9 % IV SOLN
Freq: Once | INTRAVENOUS | Status: AC
  Administered 2016-02-17: 10:00:00 via INTRAVENOUS
  Filled 2016-02-17: qty 5

## 2016-02-17 MED ORDER — PALONOSETRON HCL INJECTION 0.25 MG/5ML
INTRAVENOUS | Status: AC
  Filled 2016-02-17: qty 5

## 2016-02-17 MED ORDER — ACETAMINOPHEN 325 MG PO TABS
650.0000 mg | ORAL_TABLET | Freq: Once | ORAL | Status: AC
  Administered 2016-02-17: 650 mg via ORAL

## 2016-02-17 MED ORDER — SODIUM CHLORIDE 0.9 % IJ SOLN
10.0000 mL | INTRAMUSCULAR | Status: DC | PRN
  Administered 2016-02-17: 10 mL
  Filled 2016-02-17: qty 10

## 2016-02-17 MED ORDER — SODIUM CHLORIDE 0.9 % IV SOLN
420.0000 mg | Freq: Once | INTRAVENOUS | Status: AC
  Administered 2016-02-17: 420 mg via INTRAVENOUS
  Filled 2016-02-17: qty 14

## 2016-02-17 MED ORDER — ZOLPIDEM TARTRATE 5 MG PO TABS: 5.0000 mg | ORAL_TABLET | Freq: Every evening | ORAL | Status: DC | PRN

## 2016-02-17 MED ORDER — DIPHENHYDRAMINE HCL 25 MG PO CAPS
ORAL_CAPSULE | ORAL | Status: AC
Start: 2016-02-17 — End: 2016-02-17
  Filled 2016-02-17: qty 2

## 2016-02-17 MED ORDER — ACETAMINOPHEN 325 MG PO TABS
ORAL_TABLET | ORAL | Status: AC
  Filled 2016-02-17: qty 2

## 2016-02-17 MED ORDER — PEGFILGRASTIM 6 MG/0.6ML ~~LOC~~ PSKT
6.0000 mg | PREFILLED_SYRINGE | Freq: Once | SUBCUTANEOUS | Status: AC
  Administered 2016-02-17: 6 mg via SUBCUTANEOUS
  Filled 2016-02-17: qty 0.6

## 2016-02-17 MED ORDER — TRASTUZUMAB CHEMO INJECTION 440 MG
6.0000 mg/kg | Freq: Once | INTRAVENOUS | Status: AC
  Administered 2016-02-17: 399 mg via INTRAVENOUS
  Filled 2016-02-17: qty 19

## 2016-02-17 MED FILL — ZOLPIDEM TARTRATE 5 MG TAB: 5 | 25 days supply | Qty: 50 | Fill #0

## 2016-02-17 NOTE — Patient Instructions (Signed)
[facility] Discharge Instructions for Patients Receiving Chemotherapy  Today you received the following chemotherapy agents Herceptin, Perjeta, Taxotere and Carboplatin  To help prevent nausea and vomiting after your treatment, we encourage you to take your nausea medication {per MD orders and AVS If you develop nausea and vomiting that is not controlled by your nausea medication, call the clinic.   BELOW ARE SYMPTOMS THAT SHOULD BE REPORTED IMMEDIATELY:  *FEVER GREATER THAN 100.5 F  *CHILLS WITH OR WITHOUT FEVER  NAUSEA AND VOMITING THAT IS NOT CONTROLLED WITH YOUR NAUSEA MEDICATION  *UNUSUAL SHORTNESS OF BREATH  *UNUSUAL BRUISING OR BLEEDING  TENDERNESS IN MOUTH AND THROAT WITH OR WITHOUT PRESENCE OF ULCERS  *URINARY PROBLEMS  *BOWEL PROBLEMS  UNUSUAL RASH Items with * indicate a potential emergency and should be followed up as soon as possible.  Feel free to call the clinic you have any questions or concerns. The clinic phone number is [phone].  Please show the [facility] at check-in to the Emergency Department and triage nurse.  Trastuzumab injection for infusion What is this medicine? TRASTUZUMAB (tras TOO zoo mab) is a monoclonal antibody. It is used to treat breast cancer and stomach cancer. This medicine may be used for other purposes; ask your health care provider or pharmacist if you have questions. What should I tell my health care provider before I take this medicine? They need to know if you have any of these conditions: -heart disease -heart failure -infection (especially a virus infection such as chickenpox, cold sores, or herpes) -lung or breathing disease, like asthma -recent or ongoing radiation therapy -an unusual or allergic reaction to trastuzumab, benzyl alcohol, or other medications, foods, dyes, or preservatives -pregnant or trying to get pregnant -breast-feeding How should I use this medicine? This drug is given as  an infusion into a vein. It is administered in a hospital or clinic by a specially trained health care professional. Talk to your pediatrician regarding the use of this medicine in children. This medicine is not approved for use in children. Overdosage: If you think you have taken too much of this medicine contact a poison control center or emergency room at once. NOTE: This medicine is only for you. Do not share this medicine with others. What if I miss a dose? It is important not to miss a dose. Call your doctor or health care professional if you are unable to keep an appointment. What may interact with this medicine? -doxorubicin -warfarin This list may not describe all possible interactions. Give your health care provider a list of all the medicines, herbs, non-prescription drugs, or dietary supplements you use. Also tell them if you smoke, drink alcohol, or use illegal drugs. Some items may interact with your medicine. What should I watch for while using this medicine? Visit your doctor for checks on your progress. Report any side effects. Continue your course of treatment even though you feel ill unless your doctor tells you to stop. Call your doctor or health care professional for advice if you get a fever, chills or sore throat, or other symptoms of a cold or flu. Do not treat yourself. Try to avoid being around people who are sick. You may experience fever, chills and shaking during your first infusion. These effects are usually mild and can be treated with other medicines. Report any side effects during the infusion to your health care professional. Fever and chills usually do not happen with later infusions. Do not become pregnant while  taking this medicine or for 7 months after stopping it. Women should inform their doctor if they wish to become pregnant or think they might be pregnant. Women of child-bearing potential will need to have a negative pregnancy test before starting this  medicine. There is a potential for serious side effects to an unborn child. Talk to your health care professional or pharmacist for more information. Do not breast-feed an infant while taking this medicine or for 7 months after stopping it. Women must use effective birth control with this medicine. What side effects may I notice from receiving this medicine? Side effects that you should report to your doctor or other health care professional as soon as possible: -breathing difficulties -chest pain or palpitations -cough -dizziness or fainting -fever or chills, sore throat -skin rash, itching or hives -swelling of the legs or ankles -unusually weak or tired Side effects that usually do not require medical attention (report to your doctor or other health care professional if they continue or are bothersome): -loss of appetite -headache -muscle aches -nausea This list may not describe all possible side effects. Call your doctor for medical advice about side effects. You may report side effects to FDA at 1-800-FDA-1088. Where should I keep my medicine? This drug is given in a hospital or clinic and will not be stored at home. NOTE: This sheet is a summary. It may not cover all possible information. If you have questions about this medicine, talk to your doctor, pharmacist, or health care provider.    2016, [employer]. (2015-03-05 11:49:32) Pertuzumab injection What is this medicine? PERTUZUMAB (per TOOZ ue mab) is a monoclonal antibody. It is used to treat breast cancer. This medicine may be used for other purposes; ask your health care provider or pharmacist if you have questions. What should I tell my health care provider before I take this medicine? They need to know if you have any of these conditions: -heart disease -heart failure -high blood pressure -history of irregular heart beat -recent or ongoing radiation therapy -an unusual or allergic reaction to pertuzumab,  other medicines, foods, dyes, or preservatives -pregnant or trying to get pregnant -breast-feeding How should I use this medicine? This medicine is for infusion into a vein. It is given by a health care professional in a hospital or clinic setting. Talk to your pediatrician regarding the use of this medicine in children. Special care may be needed. Overdosage: If you think you have taken too much of this medicine contact a poison control center or emergency room at once. NOTE: This medicine is only for you. Do not share this medicine with others. What if I miss a dose? It is important not to miss your dose. Call your doctor or health care professional if you are unable to keep an appointment. What may interact with this medicine? Interactions are not expected. Give your health care provider a list of all the medicines, herbs, non-prescription drugs, or dietary supplements you use. Also tell them if you smoke, drink alcohol, or use illegal drugs. Some items may interact with your medicine. This list may not describe all possible interactions. Give your health care provider a list of all the medicines, herbs, non-prescription drugs, or dietary supplements you use. Also tell them if you smoke, drink alcohol, or use illegal drugs. Some items may interact with your medicine. What should I watch for while using this medicine? Your condition will be monitored carefully while you are receiving this medicine. Report any side effects. Continue  your course of treatment even though you feel ill unless your doctor tells you to stop. Do not become pregnant while taking this medicine or for 7 months after stopping it. Women should inform their doctor if they wish to become pregnant or think they might be pregnant. Women of child-bearing potential will need to have a negative pregnancy test before starting this medicine. There is a potential for serious side effects to an unborn child. Talk to your health care  professional or pharmacist for more information. Do not breast-feed an infant while taking this medicine or for 7 months after stopping it. Women must use effective birth control with this medicine. Call your doctor or health care professional for advice if you get a fever, chills or sore throat, or other symptoms of a cold or flu. Do not treat yourself. Try to avoid being around people who are sick. You may experience fever, chills, and headache during the infusion. Report any side effects during the infusion to your health care professional. What side effects may I notice from receiving this medicine? Side effects that you should report to your doctor or health care professional as soon as possible: -breathing problems -chest pain or palpitations -dizziness -feeling faint or lightheaded -fever or chills -skin rash, itching or hives -sore throat -swelling of the face, lips, or tongue -swelling of the legs or ankles -unusually weak or tired Side effects that usually do not require medical attention (Report these to your doctor or health care professional if they continue or are bothersome.): -diarrhea -hair loss -nausea, vomiting -tiredness This list may not describe all possible side effects. Call your doctor for medical advice about side effects. You may report side effects to FDA at 1-800-FDA-1088. Where should I keep my medicine? This drug is given in a hospital or clinic and will not be stored at home. NOTE: This sheet is a summary. It may not cover all possible information. If you have questions about this medicine, talk to your doctor, pharmacist, or health care provider.    2016, [employer]. (2015-03-05 16:07:57) Carboplatin injection What is this medicine? CARBOPLATIN (KAR boe pla tin) is a chemotherapy drug. It targets fast dividing cells, like cancer cells, and causes these cells to die. This medicine is used to treat ovarian cancer and many other cancers. This  medicine may be used for other purposes; ask your health care provider or pharmacist if you have questions. What should I tell my health care provider before I take this medicine? They need to know if you have any of these conditions: -blood disorders -hearing problems -kidney disease -recent or ongoing radiation therapy -an unusual or allergic reaction to carboplatin, cisplatin, other chemotherapy, other medicines, foods, dyes, or preservatives -pregnant or trying to get pregnant -breast-feeding How should I use this medicine? This drug is usually given as an infusion into a vein. It is administered in a hospital or clinic by a specially trained health care professional. Talk to your pediatrician regarding the use of this medicine in children. Special care may be needed. Overdosage: If you think you have taken too much of this medicine contact a poison control center or emergency room at once. NOTE: This medicine is only for you. Do not share this medicine with others. What if I miss a dose? It is important not to miss a dose. Call your doctor or health care professional if you are unable to keep an appointment. What may interact with this medicine? -medicines for seizures -medicines to increase  blood counts like filgrastim, pegfilgrastim, sargramostim -some antibiotics like amikacin, gentamicin, neomycin, streptomycin, tobramycin -vaccines Talk to your doctor or health care professional before taking any of these medicines: -acetaminophen -aspirin -ibuprofen -ketoprofen -naproxen This list may not describe all possible interactions. Give your health care provider a list of all the medicines, herbs, non-prescription drugs, or dietary supplements you use. Also tell them if you smoke, drink alcohol, or use illegal drugs. Some items may interact with your medicine. What should I watch for while using this medicine? Your condition will be monitored carefully while you are receiving this  medicine. You will need important blood work done while you are taking this medicine. This drug may make you feel generally unwell. This is not uncommon, as chemotherapy can affect healthy cells as well as cancer cells. Report any side effects. Continue your course of treatment even though you feel ill unless your doctor tells you to stop. In some cases, you may be given additional medicines to help with side effects. Follow all directions for their use. Call your doctor or health care professional for advice if you get a fever, chills or sore throat, or other symptoms of a cold or flu. Do not treat yourself. This drug decreases your body's ability to fight infections. Try to avoid being around people who are sick. This medicine may increase your risk to bruise or bleed. Call your doctor or health care professional if you notice any unusual bleeding. Be careful brushing and flossing your teeth or using a toothpick because you may get an infection or bleed more easily. If you have any dental work done, tell your dentist you are receiving this medicine. Avoid taking products that contain aspirin, acetaminophen, ibuprofen, naproxen, or ketoprofen unless instructed by your doctor. These medicines may hide a fever. Do not become pregnant while taking this medicine. Women should inform their doctor if they wish to become pregnant or think they might be pregnant. There is a potential for serious side effects to an unborn child. Talk to your health care professional or pharmacist for more information. Do not breast-feed an infant while taking this medicine. What side effects may I notice from receiving this medicine? Side effects that you should report to your doctor or health care professional as soon as possible: -allergic reactions like skin rash, itching or hives, swelling of the face, lips, or tongue -signs of infection - fever or chills, cough, sore throat, pain or difficulty passing urine -signs of  decreased platelets or bleeding - bruising, pinpoint red spots on the skin, black, tarry stools, nosebleeds -signs of decreased red blood cells - unusually weak or tired, fainting spells, lightheadedness -breathing problems -changes in hearing -changes in vision -chest pain -high blood pressure -low blood counts - This drug may decrease the number of white blood cells, red blood cells and platelets. You may be at increased risk for infections and bleeding. -nausea and vomiting -pain, swelling, redness or irritation at the injection site -pain, tingling, numbness in the hands or feet -problems with balance, talking, walking -trouble passing urine or change in the amount of urine Side effects that usually do not require medical attention (report to your doctor or health care professional if they continue or are bothersome): -hair loss -loss of appetite -metallic taste in the mouth or changes in taste This list may not describe all possible side effects. Call your doctor for medical advice about side effects. You may report side effects to FDA at 1-800-FDA-1088. Where should I keep  my medicine? This drug is given in a hospital or clinic and will not be stored at home. NOTE: This sheet is a summary. It may not cover all possible information. If you have questions about this medicine, talk to your doctor, pharmacist, or health care provider.    2016, [employer] Standard. (2008-03-03 14:38:05) Docetaxel injection What is this medicine? DOCETAXEL (doe se TAX el) is a chemotherapy drug. It targets fast dividing cells, like cancer cells, and causes these cells to die. This medicine is used to treat many types of cancers like breast cancer, certain stomach cancers, head and neck cancer, lung cancer, and prostate cancer. This medicine may be used for other purposes; ask your health care provider or pharmacist if you have questions. What should I tell my health care provider before I take this  medicine? They need to know if you have any of these conditions: -infection (especially a virus infection such as chickenpox, cold sores, or herpes) -liver disease -low blood counts, like low white cell, platelet, or red cell counts -an unusual or allergic reaction to docetaxel, polysorbate 80, other chemotherapy agents, other medicines, foods, dyes, or preservatives -pregnant or trying to get pregnant -breast-feeding How should I use this medicine? This drug is given as an infusion into a vein. It is administered in a hospital or clinic by a specially trained health care professional. Talk to your pediatrician regarding the use of this medicine in children. Special care may be needed. Overdosage: If you think you have taken too much of this medicine contact a poison control center or emergency room at once. NOTE: This medicine is only for you. Do not share this medicine with others. What if I miss a dose? It is important not to miss your dose. Call your doctor or health care professional if you are unable to keep an appointment. What may interact with this medicine? -cyclosporine -erythromycin -ketoconazole -medicines to increase blood counts like filgrastim, pegfilgrastim, sargramostim -vaccines Talk to your doctor or health care professional before taking any of these medicines: -acetaminophen -aspirin -ibuprofen -ketoprofen -naproxen This list may not describe all possible interactions. Give your health care provider a list of all the medicines, herbs, non-prescription drugs, or dietary supplements you use. Also tell them if you smoke, drink alcohol, or use illegal drugs. Some items may interact with your medicine. What should I watch for while using this medicine? Your condition will be monitored carefully while you are receiving this medicine. You will need important blood work done while you are taking this medicine. This drug may make you feel generally unwell. This is not  uncommon, as chemotherapy can affect healthy cells as well as cancer cells. Report any side effects. Continue your course of treatment even though you feel ill unless your doctor tells you to stop. In some cases, you may be given additional medicines to help with side effects. Follow all directions for their use. Call your doctor or health care professional for advice if you get a fever, chills or sore throat, or other symptoms of a cold or flu. Do not treat yourself. This drug decreases your body's ability to fight infections. Try to avoid being around people who are sick. This medicine may increase your risk to bruise or bleed. Call your doctor or health care professional if you notice any unusual bleeding. This medicine may contain alcohol in the product. You may get drowsy or dizzy. Do not drive, use machinery, or do anything that needs mental alertness until you know  how this medicine affects you. Do not stand or sit up quickly, especially if you are an older patient. This reduces the risk of dizzy or fainting spells. Avoid alcoholic drinks. Do not become pregnant while taking this medicine. Women should inform their doctor if they wish to become pregnant or think they might be pregnant. There is a potential for serious side effects to an unborn child. Talk to your health care professional or pharmacist for more information. Do not breast-feed an infant while taking this medicine. What side effects may I notice from receiving this medicine? Side effects that you should report to your doctor or health care professional as soon as possible: -allergic reactions like skin rash, itching or hives, swelling of the face, lips, or tongue -low blood counts - This drug may decrease the number of white blood cells, red blood cells and platelets. You may be at increased risk for infections and bleeding. -signs of infection - fever or chills, cough, sore throat, pain or difficulty passing urine -signs of decreased  platelets or bleeding - bruising, pinpoint red spots on the skin, black, tarry stools, nosebleeds -signs of decreased red blood cells - unusually weak or tired, fainting spells, lightheadedness -breathing problems -fast or irregular heartbeat -low blood pressure -mouth sores -nausea and vomiting -pain, swelling, redness or irritation at the injection site -pain, tingling, numbness in the hands or feet -swelling of the ankle, feet, hands -weight gain Side effects that usually do not require medical attention (report to your prescriber or health care professional if they continue or are bothersome): -bone pain -complete hair loss including hair on your head, underarms, pubic hair, eyebrows, and eyelashes -diarrhea -excessive tearing -changes in the color of fingernails -loosening of the fingernails -nausea -muscle pain -red flush to skin -sweating -weak or tired This list may not describe all possible side effects. Call your doctor for medical advice about side effects. You may report side effects to FDA at 1-800-FDA-1088. Where should I keep my medicine? This drug is given in a hospital or clinic and will not be stored at home. NOTE: This sheet is a summary. It may not cover all possible information. If you have questions about this medicine, talk to your doctor, pharmacist, or health care provider.    2016, [employer]. (2014-12-14 16:04:57)

## 2016-02-17 NOTE — Patient Instructions (Signed)

## 2016-02-17 NOTE — Progress Notes (Signed)
[place]  [phone] [phone]  Clinic follow Up Note   Patient Care Team: Kristie Cowman, MD as PCP - General (Family Medicine) Stark Klein, MD as Consulting Physician (General Surgery) Truitt Merle, MD as Consulting Physician (Hematology) Arloa Koh, MD as Consulting Physician (Radiation Oncology) Mauro Kaufmann, RN as Registered Nurse Rockwell Germany, RN as Registered Nurse Sylvan Cheese, NP as Nurse Practitioner (Nurse Practitioner) 02/17/2016  CHIEF COMPLAINTS:  Follow up right breast cancer  Oncology History   Breast cancer [facility])   Staging form: Breast, AJCC 7th Edition     Pathologic stage from 11/25/2015: Stage IIA (T1c, N1a, cM0) - Unsigned     Pathologic stage from 11/25/2015: Stage IIA (T1c, N1a, cM0) - Unsigned Breast cancer of upper-outer quadrant of right female breast [facility])   Staging form: Breast, AJCC 7th Edition     Clinical stage from 09/15/2015: Stage IA (T1b, N0, M0) - Signed by Truitt Merle, MD on 12/16/2015     Pathologic stage from 11/25/2015: Stage IIA (T1c, N1a, cM0) - Signed by Truitt Merle, MD on 12/16/2015         Breast cancer of upper-outer quadrant of right female breast ([place])   09/07/2015 Mammogram Diagnostic mammogram and the ultrasound of the right breast showed a 0.8 cm lobulated mass in the right breast 11 to 12:00 position 9 cm from the nipple. No other lesions or adenopathy.   09/08/2015 Initial Diagnosis Breast cancer of upper-outer quadrant of right female breast ([place])   09/08/2015 Initial Biopsy Right breast mass at the upper outer quadrant core needle biopsy showed invasive ductal carcinoma, grade 2-3, ductal carcinoma in situ.   09/08/2015 Receptors her2 ER 100% positive, PR 100% positive, Ki-67 60%, HER-2 positive with copy # 4.25 and ratio 2.58   11/25/2015 Surgery Right breast mastectomy and sentinel lymph node biopsy   11/25/2015 Pathology Results Right breast invasive ductal carcinoma, grade 2, 2.0  cm, DCIS, intermediate grade, (+) LVI, margins were negative, 1 out of 3 lymph nodes were negative. Left breast ostectomy was negative for malignancy.    HISTORY OF PRESENTING ILLNESS:  Sandra James 50 y.o. female with past medical history of stage I left breast cancer, is here because of newly diagnosed right breast cancer. She presents to our multidisciplinary breast clinic by herself.  The right breast cancer was discovered by screening mammogram. She did not have any palpable mass, or any constitutional symptoms. She was diagnosed with stage I left breast cancer at age of 31, she had lumpectomy, radiation, and adjuvant chemotherapy and 5 years of tamoxifen. She was treated by Dr. Louann Sjogren in [place]. She moved to [facility] about year ago due to job change. She has been very compliant with annual screening mammogram.  She had hysterectomy and bilateral oophorectomy and sphincterectomy 5 years ago for heavy bleeding.. She has been having hot flashes since then, moderate, but manageable. She is single, lives alone, works for 2 to [place] has to go up children who live in [place].  CURRENT THERAPY: Adjuvant chemotherapy with docetaxel, carboplatin, Herceptin and pejeta (TCHP) every 3 weeks started on 01/06/2016  [name] returns for follow-up and second cycle chemo. She developed severe diarrhea 5 days after chemo, with loose BM every a few hours, she took Imodium before, which caused constipation, so she has been afraid to use Imodium. She denied any significant nausea, abdominal pain, fever or chills. She had one episode of syncope secondary to dehydration,  and came in for IV fluids. She also noticed severe dry skin, and peeling of the skin on bilateral lateral chest and upper abdomen. No skin rashes or itchiness. Her diarrhea resolved up to 2 days, she still has mild diarrhea after eating, which also improved at this week. She feels pretty well at this week. No  significant weight loss. She also reports insomnia, especially after chemotherapy.  MED ICAL HISTORY:  Past Medical History  Diagnosis Date  . MVP (mitral valve prolapse)   . Infection     UTI  . Anxiety   . Fibroid   . Cancer [facility])     left brast lumpectomy   . Breast cancer of upper-outer quadrant of right female breast ([place]) 09/09/2015  . Diabetes mellitus without complication ([place])   . Depression   . GERD (gastroesophageal reflux disease)   . Complication of anesthesia     slow to wake up- BP was low, fainted next day-  . Breast cancer ([place]) 09/07/15    right breast  . Allergy   . UTI (lower urinary tract infection)   . Neuromuscular disorder ([place])     sciatic nerve paion left side/leg    SURGICAL HISTORY: Past Surgical History  Procedure Laterality Date  . Abdominal hysterectomy    . Breast surgery  2004    left lumpectomy  . Oophorectomy Bilateral   . Mastectomy w/ sentinel node biopsy Bilateral 11/25/2015    Procedure: BILATERAL SKIN SPARING MASTECTOMIES WITH RIGHT SENTINEL LYMPH NODE BIOPSY;  Surgeon: Stark Klein, MD;  Location: [place];  Service: General;  Laterality: Bilateral;  . Breast reconstruction with placement of tissue expander and flex hd (acellular hydrated dermis) Bilateral 11/25/2015    Procedure: BILATERAL IMMEDIATE BREAST RECONSTRUCTION WITH PLACEMENT OF TISSUE EXPANDERS AND FLEX HD (ACELLULAR HYDRATED DERMIS);  Surgeon: Wallace Going, DO;  Location: [place];  Service: Plastics;  Laterality: Bilateral;  . Portacath placement Left 12/20/2015    Procedure: INSERTION PORT-A-CATH, left chest;  Surgeon: Stark Klein, MD;  Location: [place] OR;  Service: General;  Laterality: Left;    SOCIAL HISTORY: Social History   Social History  . Marital Status: Single     Spouse Name: N/A  . Number of Children: 2 children, 11 daughter and 58 yo son    . Years of Education: N/A   Occupational History  . She is a Barista rep     Social History Main Topics  . Smoking status: Never Smoker   . Smokeless tobacco: Never Used  . Alcohol Use: Yes     Comment: 2-3/wk  . Drug Use: No  . Sexual Activity: Yes    Birth Control/ Protection: Surgical   Other Topics Concern  . Not on file   Social History Narrative    FAMILY HISTORY: Family History  Problem Relation Age of Onset  . Hypertension Mother   . Stroke Mother   . Alzheimer's disease Mother   . Heart disease Father   . Stroke Father   . Heart attack Father   . Breast cancer Sister 53    double mastectomy  . Other Sister     "stomach tumor that wrapped around reproductive organs"; dx. 49s; required partial hysterectomy  . Alzheimer's disease Maternal Grandmother   . Alzheimer's disease Paternal Grandmother   . Other Other     had to have lymph nodes removed and radiation; dx. 58s  . Cancer Maternal Aunt     unspecified type; dx. <50  .  Colon cancer Maternal Uncle     dx. 97s  . Breast cancer Cousin     dx. 84s    ALLERGIES:  is allergic to lisinopril; oxycodone; and percocet.  MEDICATIONS:  Current Outpatient Prescriptions  Medication Sig Dispense Refill  . dexamethasone (DECADRON) 4 MG tablet Take 2 tablets (8 mg total) by mouth 2 (two) times daily. Start the day before Taxotere. Then again the day after chemo for 3 days. 30 tablet 1  . diazepam (VALIUM) 2 MG tablet Take 2 mg by mouth every 6 (six) hours as needed for muscle spasms. Reported on 12/22/2015    . HYDROcodone-acetaminophen (NORCO/VICODIN) 5-325 MG tablet Take 1 tablet by mouth every 6 (six) hours as needed for moderate pain. 30 tablet 0  . lidocaine-prilocaine (EMLA) cream Apply to affected area once 30 g 3  . magic mouthwash SOLN Take 5 mLs by mouth 4 (four) times daily as needed for mouth pain. 320 mL 1  . metFORMIN (GLUCOPHAGE) 500 MG tablet Take 500 mg by mouth 2 (two) times daily with a meal. Reported on 12/22/2015  0  . mirtazapine (REMERON) 30 MG tablet Take 1 tablet (30 mg  total) by mouth at bedtime. 30 tablet 3  . omeprazole (PRILOSEC) 20 MG capsule Take 1 capsule (20 mg total) by mouth daily. 14 capsule 0  . ondansetron (ZOFRAN) 8 MG tablet Take 1 tablet (8 mg total) by mouth 2 (two) times daily. Start the day after chemo for 3 days. Then take as needed for nausea or vomiting. 30 tablet 1  . polyethylene glycol (MIRALAX / GLYCOLAX) packet Take 17 g by mouth daily as needed for mild constipation. 14 each 0  . potassium chloride SA (K-DUR,KLOR-CON) 20 MEQ tablet Take 1 tablet (20 mEq total) by mouth daily. 30 tablet 1  . prochlorperazine (COMPAZINE) 10 MG tablet Take 1 tablet (10 mg total) by mouth every 6 (six) hours as needed (Nausea or vomiting). 30 tablet 1  . zolpidem (AMBIEN) 5 MG tablet Take 1-2 tablets (5-10 mg total) by mouth at bedtime as needed for sleep. 50 tablet 1   No current facility-administered medications for this visit.   Facility-Administered Medications Ordered in Other Visits  Medication Dose Route Frequency Provider Last Rate Last Dose  . sodium chloride 0.9 % injection 10 mL  10 mL Intracatheter PRN Truitt Merle, MD   10 mL at 01/06/16 1730    REVIEW OF SYSTEMS:   Constitutional: Denies fevers, chills or abnormal night sweats Eyes: Denies blurriness of vision, double vision or watery eyes Ears, nose, mouth, throat, and face: Denies mucositis or sore throat Respiratory: Denies cough, dyspnea or wheezes Cardiovascular: Denies palpitation, chest discomfort or lower extremity swelling Gastrointestinal:  Denies nausea, heartburn or change in bowel habits Skin: Denies abnormal skin rashes Lymphatics: Denies new lymphadenopathy or easy bruising Neurological:Denies numbness, tingling or new weaknesses Behavioral/Psych: Mood is stable, no new changes  All other systems were reviewed with the patient and are negative.  PHYSICAL EXAMINATION: ECOG PERFORMANCE STATUS: 1  Filed Vitals:   02/17/16 0822  BP: 115/82  Pulse: 100  Temp: 98.6 F (37  C)  Resp: 18   Filed Weights   02/17/16 0822  Weight: 158 lb (71.668 kg)    GENERAL:alert, no distress and comfortable SKIN: skin color, texture, turgor are normal, no rashes or significant lesions EYES: normal, conjunctiva are pink and non-injected, sclera clear OROPHARYNX:no exudate, no erythema and lips, buccal mucosa, and tongue normal  NECK: supple, thyroid normal  size, non-tender, without nodularity LYMPH:  no palpable lymphadenopathy in the cervical, axillary or inguinal LUNGS: clear to auscultation and percussion with normal breathing effort HEART: regular rate & rhythm and no murmurs and no lower extremity edema ABDOMEN:abdomen soft, non-tender and normal bowel sounds Musculoskeletal:no cyanosis of digits and no clubbing  PSYCH: alert & oriented x 3 with fluent speech NEURO: no focal motor/sensory deficits Breasts: s/p bilateral mastectomy and tissue spender placement. Surgical incision sites are clean, no surrounding skin erythema or discharge.    LABORATORY DATA:  I have reviewed the data as listed CBC Latest Ref Rng 02/17/2016 02/02/2016 01/27/2016  WBC 3.9 - 10.3 10e3/uL 7.3 15.9(H) 9.7  Hemoglobin 11.6 - 15.9 g/dL 11.6 12.9 11.3(L)  Hematocrit 34.8 - 46.6 % 34.7(L) 37.8 33.6(L)  Platelets 145 - 400 10e3/uL 291 216 336    CMP Latest Ref Rng 02/17/2016 02/02/2016 01/27/2016  Glucose 70 - 140 mg/dl 123 119 118  BUN 7.0 - 26.0 mg/dL 7.5 7.8 6.9(L)  Creatinine 0.6 - 1.1 mg/dL 0.8 0.9 0.8  Sodium 136 - 145 mEq/L 141 139 142  Potassium 3.5 - 5.1 mEq/L 3.4(L) 3.3(L) 3.4(L)  CO2 22 - 29 mEq/L 27 24 26   Calcium 8.4 - 10.4 mg/dL 9.1 9.8 9.1  Total Protein 6.4 - 8.3 g/dL 7.4 8.5(H) 7.5  Total Bilirubin 0.20 - 1.20 mg/dL 0.38 0.46 0.43  Alkaline Phos 40 - 150 U/L 99 134 93  AST 5 - 34 U/L 16 29 18   ALT 0 - 55 U/L 28 57(H) 30     Pathology report Diagnosis 11/25/2015 1. Breast, simple mastectomy, right - INVASIVE DUCTAL CARCINOMA, GRADE 2/3, SPANNING 2.0 CM. - DUCTAL  CARCINOMA IN SITU, INTERMEDIATE GRADE. - LYMPHOVASCULAR INVASION IS IDENTIFIED. - LOBULAR NEOPLASIA (ATYPICAL LOBULAR HYPERPLASIA). - THE SURGICAL RESECTION MARGINS ARE NEGATIVE FOR CARCINOMA. - SEE ONCOLOGY TABLE BELOW. 2. Lymph node, sentinel, biopsy, right axillary #1 - METASTATIC CARCINOMA IN 1 OF 1 LYMPH NODE (1/1). 3. Lymph node, sentinel, biopsy, right axillary #2 - THERE IS NO EVIDENCE OF CARCINOMA IN 1 OF 1 LYMPH NODE (0/1). 4. Lymph node, sentinel, biopsy, right axillary #3 - THERE IS NO EVIDENCE OF CARCINOMA IN 1 OF 1 LYMPH NODE (0/1). 5. Breast, simple mastectomy, left - BENIGN BREAST PARENCHYMA WITH DENSE STROMAL FIBROSIS. - FIBROADENOMA. - THERE IS NO EVIDENCE OF MALIGNANCY. 6. Breast, excision, left, additional lateral margin - BENIGN FIBROADIPOSE TISSUE. - THERE IS NO EVIDENCE OF MALIGNANCY. - SEE COMMENT. Microscopic Comment 1. BREAST, INVASIVE TUMOR, WITH LYMPH NODES PRESENT Specimen, including laterality and lymph node sampling (sentinel, non-sentinel): Right breast and right axillary lymph nodes Procedure: Bilateral mastectomy and multiple right axillary lymph node resections Histologic type: Ductal Grade: 2 Tubule formation: 2 1 of 4 FINAL for James, Sandra ([id number]) Microscopic Comment(continued) Nuclear pleomorphism: 2 Mitotic: 2 Tumor size (gross measurement): 2.0 cm Margins: Negative for carcinoma Invasive, distance to closest margin: 1.8 cm to the posterior margin In-situ, distance to closest margin: 1.8 cm to the posterior margin Lymphovascular invasion: Present Ductal carcinoma in situ: Present Grade: Intermediate grade Extensive intraductal component: Not identified Lobular neoplasia: Present, atypical lobular hyperplasia Tumor focality: Unifocal Treatment effect: Not identified Extent of tumor: Confined to breast parenchyma Lymph nodes: Examined: 3 Sentinel 0 Non-sentinel 3 Total Lymph nodes with metastasis: 1 (macrometastasis) - no  extracapsular extension is identified. Breast prognostic profile: [phone] Estrogen receptor: 100%, strong staining intensity Progesterone receptor: 100%, strong staining intensity Her 2 neu: Amplification was detected. The ratio was 2.58 Ki-67: 60%  TNM: pT1c, pN1a Non-neoplastic breast: No significant findings. 6. The surgical resection margin(s) of the specimen were inked and microscopically evaluated. Enid Cutter MD Pathologist, Electronic Signature (Case signed 11/29/2015)   RADIOGRAPHIC STUDIES: I have personally reviewed the radiological images as listed and agreed with the findings in the report.  Bone scan 12/28/2014 IMPRESSION: Foci of increased tracer localization at the sternum, unable to exclude metastases.  Uptake at inferior cervical spine could potentially be related to degenerative disc disease changes present at C5-C6 on CT.  Questionable abnormal increased tracer localization at the posterior LEFT ninth and tenth ribs.  CT chest, abdomen and pelvis 12/28/2014 IMPRESSION: 1. 2 cm enhancing lesion in segment 5 of the liver is indeterminate. It could reflect FNH, vascular shunt and less likely metastasis. Recommend MRI abdomen without and with contrast using Eovist for further evaluation. No other hepatic lesions. 2. No findings for metastatic disease involving the chest. There is a focal area of airspace disease in the superior segment of the right lower lobe which could be a developing or resolving infiltrate. Recommend correlation with clinical symptoms.  ECHO 12/21/2015 Study Conclusions  - Left ventricle: The cavity size was normal. Wall thickness was increased in a pattern of mild LVH. Systolic function was normal. The estimated ejection fraction was in the range of 55% to 60%. Wall motion was normal; there were no regional wall motion abnormalities. Global longitudinal strain: -18.5% There was no evidence of elevated ventricular filling  pressure by Doppler parameters.  ASSESSMENT & PLAN:  50 year old African-American female, surgical postmenopausal, history of stage I left breast cancer, with newly diagnosed right stage I breast cancer.  1. Right breast invasive ductal carcinoma, pT1cN1aM0, stage IIB, grade 2, ER100%+/PR100%+/HER2+ -I previously reviewed her surgical pathology results with patient in great details. She has 1 out of 3 sentinel lymph nodes positive, her pathological stage is more advanced than her initial clinical stage.  -We reviewed the natural history of triple positive breast cancer. HER-2 positive tumors are more progressive, with higher risk of recurrence -I reviewed her restaging CT scan and bone scan images with her in person. I discussed the bone scan findings with radiologist Dr. Thornton Papas. The mild hypermetabolic uptake in the sternum and left ninth and 10th rib have no significant corresponding change on the CT scan, except for small lytic spot in the sternum. They're not amenable for biopsy, PET scan may not have much additional value, the suspicion for metastases from breast cancer is low. I recommend follow-up. The 2 cm lesion in the liver are likely benign, unfortunately she is not able to do liver MRI, due to her breast implants.  -She is still concerned about the above scan findings. I recommend to have a repeated CT or PET scan a few months after she completes adjuvant chemotherapy for follow-up. -Given her high risk of cancer recurrence, I recommend adjuvant chemotherapy with docetaxel, carboplatin, Herceptin and Perjeta (TCHP), every 3 weeks for 6 cycles,  followed by maintenance Herceptin to complete a 1 year therapy. She previously received Adriamycin, will not be able to tolerate more adrimycin. -She is tolerating chemotherapy well moderately well, we'll continue.   2. Diarrhea  -Secondary to chemotherapy. -I recommend her to use Imodium as needed but she is afraid of using it due to pneumonia  related constipation. She2 drink fluids adequately to avoid dehydration. -I'll schedule her to return in 5 days for IV fluids.  3. Genetics -Due to her young age and recurrent breast cancer, she was referred  to see a genetic counselor in our cancer center. -Her genetic test was negative  4. DM -she will continue follow-up with her primary care physician -She is on metformin -We again reviewed steroids induced hyperglycemia, she'll monitor her sugar closely at home, especially after chemotherapy.  4. Bone health -She is postmenopausal by surgery -I encouraged her to take calcium and vitamin D -I'll obtain a baseline bone density scan   5. Insomnia and low appetite  -continue mirtazapine -I give her a prescription of Ambien today  Plan for today  -Lab reviewed, adequate for treatment, cycle 3 today.  -IVF in 5 days  -RTC in 3 weeks with lab, f/p and chemo   All questions were answered. The patient knows to call the clinic with any problems, questions or concerns.  I spent 25 minutes counseling the patient face to face. The total time spent in the appointment was 30 minutes and more than 50% was on counseling.     Truitt Merle, MD 02/17/2016

## 2016-02-17 NOTE — Telephone Encounter (Signed)
per pof to ch pt appt-gave pt copy of avs

## 2016-02-22 ENCOUNTER — Encounter: Payer: Self-pay | Admitting: Hematology

## 2016-02-22 ENCOUNTER — Other Ambulatory Visit: Payer: Self-pay | Admitting: Hematology

## 2016-02-22 ENCOUNTER — Ambulatory Visit (HOSPITAL_BASED_OUTPATIENT_CLINIC_OR_DEPARTMENT_OTHER): Payer: BLUE CROSS/BLUE SHIELD

## 2016-02-22 VITALS — BP 117/72 | HR 105 | Temp 98.6°F | Resp 16

## 2016-02-22 DIAGNOSIS — E860 Dehydration: Secondary | ICD-10-CM | POA: Diagnosis not present

## 2016-02-22 DIAGNOSIS — C50411 Malignant neoplasm of upper-outer quadrant of right female breast: Secondary | ICD-10-CM

## 2016-02-22 MED ORDER — HEPARIN SOD (PORK) LOCK FLUSH 100 UNIT/ML IV SOLN
500.0000 [IU] | Freq: Once | INTRAVENOUS | Status: AC | PRN
  Administered 2016-02-22: 500 [IU]
  Filled 2016-02-22: qty 5

## 2016-02-22 MED ORDER — SODIUM CHLORIDE 0.9 % IV SOLN
Freq: Once | INTRAVENOUS | Status: AC
  Administered 2016-02-22: 14:00:00 via INTRAVENOUS

## 2016-02-22 MED ORDER — SODIUM CHLORIDE 0.9 % IJ SOLN
10.0000 mL | INTRAMUSCULAR | Status: DC | PRN
  Administered 2016-02-22: 10 mL
  Filled 2016-02-22: qty 10

## 2016-02-22 NOTE — Progress Notes (Signed)
Port placement verified with radiology, "tip in distal SVC-near cavoatrial junction" verbal order per Dr. Candise Che.

## 2016-02-22 NOTE — Progress Notes (Signed)
Emailed letter of release for patient and gave her copy for her records.

## 2016-02-22 NOTE — Patient Instructions (Signed)

## 2016-02-22 NOTE — Progress Notes (Signed)
Composed letter for dr. Burr Medico to sign releasing patient for work thurs 02/24/16

## 2016-03-02 MED ORDER — HYDRALAZINE HCL 20 MG/ML IJ SOLN
INTRAMUSCULAR | Status: AC
  Filled 2016-03-02: qty 1

## 2016-03-02 MED ORDER — ONDANSETRON HCL 4 MG/2ML IJ SOLN
INTRAMUSCULAR | Status: AC
  Filled 2016-03-02: qty 4

## 2016-03-02 MED ORDER — PROPOFOL 10 MG/ML IV BOLUS
INTRAVENOUS | Status: AC
  Filled 2016-03-02: qty 20

## 2016-03-02 MED ORDER — EPHEDRINE SULFATE 50 MG/ML IJ SOLN
INTRAMUSCULAR | Status: AC
  Filled 2016-03-02: qty 1

## 2016-03-02 MED ORDER — SODIUM CHLORIDE 0.9 % IJ SOLN
INTRAMUSCULAR | Status: AC
  Filled 2016-03-02: qty 10

## 2016-03-02 MED ORDER — DEXAMETHASONE SODIUM PHOSPHATE 10 MG/ML IJ SOLN
INTRAMUSCULAR | Status: AC
  Filled 2016-03-02: qty 1

## 2016-03-02 MED ORDER — HYDROMORPHONE HCL 2 MG/ML IJ SOLN
INTRAMUSCULAR | Status: AC
  Filled 2016-03-02: qty 1

## 2016-03-02 MED ORDER — MIDAZOLAM HCL 2 MG/2ML IJ SOLN
INTRAMUSCULAR | Status: AC
  Filled 2016-03-02: qty 2

## 2016-03-02 MED ORDER — SUGAMMADEX SODIUM 200 MG/2ML IV SOLN
INTRAVENOUS | Status: AC
  Filled 2016-03-02: qty 4

## 2016-03-02 MED ORDER — LIDOCAINE HCL (CARDIAC) 20 MG/ML IV SOLN
INTRAVENOUS | Status: AC
  Filled 2016-03-02: qty 5

## 2016-03-02 MED ORDER — ROCURONIUM BROMIDE 100 MG/10ML IV SOLN
INTRAVENOUS | Status: AC
Start: 2016-03-02 — End: 2016-03-02
  Filled 2016-03-02: qty 1

## 2016-03-02 MED ORDER — FENTANYL CITRATE (PF) 100 MCG/2ML IJ SOLN
INTRAMUSCULAR | Status: AC
  Filled 2016-03-02: qty 2

## 2016-03-04 ENCOUNTER — Other Ambulatory Visit: Payer: Self-pay | Admitting: Oncology

## 2016-03-09 ENCOUNTER — Ambulatory Visit (HOSPITAL_BASED_OUTPATIENT_CLINIC_OR_DEPARTMENT_OTHER): Payer: BLUE CROSS/BLUE SHIELD | Admitting: Hematology

## 2016-03-09 ENCOUNTER — Encounter: Payer: Self-pay | Admitting: *Deleted

## 2016-03-09 ENCOUNTER — Other Ambulatory Visit (HOSPITAL_BASED_OUTPATIENT_CLINIC_OR_DEPARTMENT_OTHER): Payer: BLUE CROSS/BLUE SHIELD

## 2016-03-09 ENCOUNTER — Telehealth: Payer: Self-pay | Admitting: *Deleted

## 2016-03-09 ENCOUNTER — Ambulatory Visit: Payer: BLUE CROSS/BLUE SHIELD

## 2016-03-09 ENCOUNTER — Telehealth: Payer: Self-pay | Admitting: Hematology

## 2016-03-09 ENCOUNTER — Encounter: Payer: Self-pay | Admitting: Hematology

## 2016-03-09 ENCOUNTER — Ambulatory Visit (HOSPITAL_BASED_OUTPATIENT_CLINIC_OR_DEPARTMENT_OTHER): Payer: BLUE CROSS/BLUE SHIELD

## 2016-03-09 VITALS — BP 112/73 | HR 95 | Temp 98.7°F | Resp 18 | Ht 66.0 in | Wt 155.9 lb

## 2016-03-09 DIAGNOSIS — L309 Dermatitis, unspecified: Secondary | ICD-10-CM

## 2016-03-09 DIAGNOSIS — Z5189 Encounter for other specified aftercare: Secondary | ICD-10-CM

## 2016-03-09 DIAGNOSIS — Z95828 Presence of other vascular implants and grafts: Secondary | ICD-10-CM

## 2016-03-09 DIAGNOSIS — Z5111 Encounter for antineoplastic chemotherapy: Secondary | ICD-10-CM

## 2016-03-09 DIAGNOSIS — R197 Diarrhea, unspecified: Secondary | ICD-10-CM | POA: Diagnosis not present

## 2016-03-09 DIAGNOSIS — E119 Type 2 diabetes mellitus without complications: Secondary | ICD-10-CM

## 2016-03-09 DIAGNOSIS — C50411 Malignant neoplasm of upper-outer quadrant of right female breast: Secondary | ICD-10-CM

## 2016-03-09 DIAGNOSIS — Z853 Personal history of malignant neoplasm of breast: Secondary | ICD-10-CM

## 2016-03-09 LAB — CBC WITH DIFFERENTIAL/PLATELET
BASO%: 0.6 % (ref 0.0–2.0)
Basophils Absolute: 0 10*3/uL (ref 0.0–0.1)
EOS%: 0.6 % (ref 0.0–7.0)
Eosinophils Absolute: 0 10*3/uL (ref 0.0–0.5)
HCT: 34.8 % (ref 34.8–46.6)
HEMOGLOBIN: 11.7 g/dL (ref 11.6–15.9)
LYMPH%: 27.6 % (ref 14.0–49.7)
MCH: 30.5 pg (ref 25.1–34.0)
MCHC: 33.6 g/dL (ref 31.5–36.0)
MCV: 90.6 fL (ref 79.5–101.0)
MONO#: 0.6 10*3/uL (ref 0.1–0.9)
MONO%: 8.6 % (ref 0.0–14.0)
NEUT%: 62.6 % (ref 38.4–76.8)
NEUTROS ABS: 4.2 10*3/uL (ref 1.5–6.5)
Platelets: 278 10*3/uL (ref 145–400)
RBC: 3.84 10*6/uL (ref 3.70–5.45)
RDW: 16.5 % — ABNORMAL HIGH (ref 11.2–14.5)
WBC: 6.6 10*3/uL (ref 3.9–10.3)
lymph#: 1.8 10*3/uL (ref 0.9–3.3)

## 2016-03-09 LAB — COMPREHENSIVE METABOLIC PANEL
ALT: 59 U/L — AB (ref 0–55)
AST: 37 U/L — AB (ref 5–34)
Albumin: 3.9 g/dL (ref 3.5–5.0)
Alkaline Phosphatase: 83 U/L (ref 40–150)
Anion Gap: 9 mEq/L (ref 3–11)
BILIRUBIN TOTAL: 0.46 mg/dL (ref 0.20–1.20)
BUN: 6 mg/dL — AB (ref 7.0–26.0)
CO2: 27 meq/L (ref 22–29)
Calcium: 9.1 mg/dL (ref 8.4–10.4)
Chloride: 107 mEq/L (ref 98–109)
Creatinine: 0.8 mg/dL (ref 0.6–1.1)
EGFR: >90 mL/min/{1.73_m2} (ref >90)
GLUCOSE: 92 mg/dL (ref 70–140)
Potassium: 3.4 mEq/L — ABNORMAL LOW (ref 3.5–5.1)
Sodium: 143 mEq/L (ref 136–145)
TOTAL PROTEIN: 7.3 g/dL (ref 6.4–8.3)

## 2016-03-09 MED ORDER — SODIUM CHLORIDE 0.9 % IV SOLN
75.0000 mg/m2 | Freq: Once | INTRAVENOUS | Status: AC
  Administered 2016-03-09: 130 mg via INTRAVENOUS
  Filled 2016-03-09: qty 13

## 2016-03-09 MED ORDER — ACETAMINOPHEN 325 MG PO TABS
ORAL_TABLET | ORAL | Status: AC
  Filled 2016-03-09: qty 2

## 2016-03-09 MED ORDER — DIPHENHYDRAMINE HCL 25 MG PO CAPS
ORAL_CAPSULE | ORAL | Status: AC
  Filled 2016-03-09: qty 2

## 2016-03-09 MED ORDER — FOSAPREPITANT DIMEGLUMINE INJECTION 150 MG
Freq: Once | INTRAVENOUS | Status: AC
  Administered 2016-03-09: 11:00:00 via INTRAVENOUS
  Filled 2016-03-09: qty 5

## 2016-03-09 MED ORDER — PALONOSETRON HCL INJECTION 0.25 MG/5ML
INTRAVENOUS | Status: AC
  Filled 2016-03-09: qty 5

## 2016-03-09 MED ORDER — TRASTUZUMAB CHEMO INJECTION 440 MG
6.0000 mg/kg | Freq: Once | INTRAVENOUS | Status: AC
  Administered 2016-03-09: 399 mg via INTRAVENOUS
  Filled 2016-03-09: qty 19

## 2016-03-09 MED ORDER — HEPARIN SOD (PORK) LOCK FLUSH 100 UNIT/ML IV SOLN
500.0000 [IU] | Freq: Once | INTRAVENOUS | Status: AC | PRN
  Administered 2016-03-09: 500 [IU]
  Filled 2016-03-09: qty 5

## 2016-03-09 MED ORDER — CARBOPLATIN CHEMO INJECTION 600 MG/60ML
711.6000 mg | Freq: Once | INTRAVENOUS | Status: AC
  Administered 2016-03-09: 710 mg via INTRAVENOUS
  Filled 2016-03-09: qty 71

## 2016-03-09 MED ORDER — SODIUM CHLORIDE 0.9% FLUSH
10.0000 mL | INTRAVENOUS | Status: DC | PRN
  Administered 2016-03-09: 10 mL via INTRAVENOUS
  Filled 2016-03-09: qty 10

## 2016-03-09 MED ORDER — ACETAMINOPHEN 325 MG PO TABS
650.0000 mg | ORAL_TABLET | Freq: Once | ORAL | Status: AC
  Administered 2016-03-09: 650 mg via ORAL

## 2016-03-09 MED ORDER — PEGFILGRASTIM 6 MG/0.6ML ~~LOC~~ PSKT
6.0000 mg | PREFILLED_SYRINGE | Freq: Once | SUBCUTANEOUS | Status: AC
  Administered 2016-03-09: 6 mg via SUBCUTANEOUS
  Filled 2016-03-09: qty 0.6

## 2016-03-09 MED ORDER — SODIUM CHLORIDE 0.9 % IJ SOLN
10.0000 mL | INTRAMUSCULAR | Status: DC | PRN
  Administered 2016-03-09: 10 mL
  Filled 2016-03-09: qty 10

## 2016-03-09 MED ORDER — DIPHENHYDRAMINE HCL 25 MG PO CAPS
50.0000 mg | ORAL_CAPSULE | Freq: Once | ORAL | Status: AC
  Administered 2016-03-09: 50 mg via ORAL

## 2016-03-09 MED ORDER — SODIUM CHLORIDE 0.9 % IV SOLN
Freq: Once | INTRAVENOUS | Status: AC
  Administered 2016-03-09: 10:00:00 via INTRAVENOUS

## 2016-03-09 MED ORDER — PALONOSETRON HCL INJECTION 0.25 MG/5ML
0.2500 mg | Freq: Once | INTRAVENOUS | Status: AC
  Administered 2016-03-09: 0.25 mg via INTRAVENOUS

## 2016-03-09 MED ORDER — PERTUZUMAB CHEMO INJECTION 420 MG/14ML
420.0000 mg | Freq: Once | INTRAVENOUS | Status: AC
  Administered 2016-03-09: 420 mg via INTRAVENOUS
  Filled 2016-03-09: qty 14

## 2016-03-09 NOTE — Telephone Encounter (Signed)
per pof to sch pt appt-MW sch trmt-pt to get updated copy b4 leaving °

## 2016-03-09 NOTE — Patient Instructions (Signed)
[id number] [facility] Guide An implanted port is a type of central line that is placed under the skin. Central lines are used to provide IV access when treatment or nutrition needs to be given through a person's veins. Implanted ports are used for long-term IV access. An implanted port may be placed because:   You need IV medicine that would be irritating to the small veins in your hands or arms.   You need long-term IV medicines, such as antibiotics.   You need IV nutrition for a long period.   You need frequent blood draws for lab tests.   You need dialysis.  Implanted ports are usually placed in the chest area, but they can also be placed in the upper arm, the abdomen, or the leg. An implanted port has two main parts:   Reservoir. The reservoir is round and will appear as a small, raised area under your skin. The reservoir is the part where a needle is inserted to give medicines or draw blood.   Catheter. The catheter is a thin, flexible tube that extends from the reservoir. The catheter is placed into a large vein. Medicine that is inserted into the reservoir goes into the catheter and then into the vein.  HOW WILL I CARE FOR MY INCISION SITE? Do not get the incision site wet. Bathe or shower as directed by your health care provider.  HOW IS MY PORT ACCESSED? Special steps must be taken to access the port:   Before the port is accessed, a numbing cream can be placed on the skin. This helps numb the skin over the port site.   Your health care provider uses a sterile technique to access the port.  Your health care provider must put on a mask and sterile gloves.  The skin over your port is cleaned carefully with an antiseptic and allowed to dry.  The port is gently pinched between sterile gloves, and a needle is inserted into the port.  Only "non-coring" port needles should be used to access the port. Once the port is accessed, a blood return  should be checked. This helps ensure that the port is in the vein and is not clogged.   If your port needs to remain accessed for a constant infusion, a clear (transparent) bandage will be placed over the needle site. The bandage and needle will need to be changed every week, or as directed by your health care provider.   Keep the bandage covering the needle clean and dry. Do not get it wet. Follow your health care provider's instructions on how to take a shower or bath while the port is accessed.   If your port does not need to stay accessed, no bandage is needed over the port.  WHAT IS FLUSHING? Flushing helps keep the port from getting clogged. Follow your health care provider's instructions on how and when to flush the port. Ports are usually flushed with saline solution or a medicine called heparin. The need for flushing will depend on how the port is used.   If the port is used for intermittent medicines or blood draws, the port will need to be flushed:   After medicines have been given.   After blood has been drawn.   As part of routine maintenance.   If a constant infusion is running, the port may not need to be flushed.  HOW LONG WILL MY PORT STAY IMPLANTED? The port can stay in for as long as your health care  provider thinks it is needed. When it is time for the port to come out, surgery will be done to remove it. The procedure is similar to the one performed when the port was put in.  WHEN SHOULD I SEEK IMMEDIATE MEDICAL CARE? When you have an implanted port, you should seek immediate medical care if:   You notice a bad smell coming from the incision site.   You have swelling, redness, or drainage at the incision site.   You have more swelling or pain at the port site or the surrounding area.   You have a fever that is not controlled with medicine.   This information is not intended to replace advice given to you by your health care provider. Make sure you  discuss any questions you have with your health care provider.   Document Released: 11/27/2005 Document Revised: 09/17/2013 Document Reviewed: 08/04/2013 Elsevier Interactive Patient Education [employer].

## 2016-03-09 NOTE — Patient Instructions (Signed)
[facility] Discharge Instructions for Patients Receiving Chemotherapy  Today you received the following chemotherapy agents: Herceptin, Perjeta, Taxotere, Carboplatin   To help prevent nausea and vomiting after your treatment, we encourage you to take your nausea medication as directed.    If you develop nausea and vomiting that is not controlled by your nausea medication, call the clinic.   BELOW ARE SYMPTOMS THAT SHOULD BE REPORTED IMMEDIATELY:  *FEVER GREATER THAN 100.5 F  *CHILLS WITH OR WITHOUT FEVER  NAUSEA AND VOMITING THAT IS NOT CONTROLLED WITH YOUR NAUSEA MEDICATION  *UNUSUAL SHORTNESS OF BREATH  *UNUSUAL BRUISING OR BLEEDING  TENDERNESS IN MOUTH AND THROAT WITH OR WITHOUT PRESENCE OF ULCERS  *URINARY PROBLEMS  *BOWEL PROBLEMS  UNUSUAL RASH Items with * indicate a potential emergency and should be followed up as soon as possible.  Feel free to call the clinic you have any questions or concerns. The clinic phone number is [phone].  Please show the CHEMO ALERT CARD at check-in to the Emergency Department and triage nurse.   

## 2016-03-09 NOTE — Telephone Encounter (Signed)
Per staff message and POF I have scheduled appts. Advised scheduler of appts. JMW  

## 2016-03-09 NOTE — Progress Notes (Signed)
[place]  Telephone:([phone]) [phone] [phone]) [phone]  Clinic follow Up Note   Patient Care Team: Kristie Cowman, MD as PCP - General (Family Medicine) Stark Klein, MD as Consulting Physician (General Surgery) Truitt Merle, MD as Consulting Physician (Hematology) Arloa Koh, MD as Consulting Physician (Radiation Oncology) Mauro Kaufmann, RN as Registered Nurse Rockwell Germany, RN as Registered Nurse Sylvan Cheese, NP as Nurse Practitioner (Nurse Practitioner) 03/09/2016  CHIEF COMPLAINTS:  Follow up right breast cancer  Oncology History   Breast cancer [facility])   Staging form: Breast, AJCC 7th Edition     Pathologic stage from 11/25/2015: Stage IIA (T1c, N1a, cM0) - Unsigned     Pathologic stage from 11/25/2015: Stage IIA (T1c, N1a, cM0) - Unsigned Breast cancer of upper-outer quadrant of right female breast [facility])   Staging form: Breast, AJCC 7th Edition     Clinical stage from 09/15/2015: Stage IA (T1b, N0, M0) - Signed by Truitt Merle, MD on 12/16/2015     Pathologic stage from 11/25/2015: Stage IIA (T1c, N1a, cM0) - Signed by Truitt Merle, MD on 12/16/2015         Breast cancer of upper-outer quadrant of right female breast ([place])   09/07/2015 Mammogram Diagnostic mammogram and the ultrasound of the right breast showed a 0.8 cm lobulated mass in the right breast 11 to 12:00 position 9 cm from the nipple. No other lesions or adenopathy.   09/08/2015 Initial Diagnosis Breast cancer of upper-outer quadrant of right female breast ([place])   09/08/2015 Initial Biopsy Right breast mass at the upper outer quadrant core needle biopsy showed invasive ductal carcinoma, grade 2-3, ductal carcinoma in situ.   09/08/2015 Receptors her2 ER 100% positive, PR 100% positive, Ki-67 60%, HER-2 positive with copy # 4.25 and ratio 2.58   11/25/2015 Surgery Right breast mastectomy and sentinel lymph node biopsy   11/25/2015 Pathology Results Right breast invasive ductal carcinoma, grade 2, 2.0  cm, DCIS, intermediate grade, (+) LVI, margins were negative, 1 out of 3 lymph nodes were negative. Left breast ostectomy was negative for malignancy.   01/06/2016 -  Chemotherapy adjuvant chemotherapy TCH P, every 3 weeks, plan for 6 weeks     HISTORY OF PRESENTING ILLNESS:  Sandra James 50 y.o. female with past medical history of stage I left breast cancer, is here because of newly diagnosed right breast cancer. She presents to our multidisciplinary breast clinic by herself.  The right breast cancer was discovered by screening mammogram. She did not have any palpable mass, or any constitutional symptoms. She was diagnosed with stage I left breast cancer at age of 22, she had lumpectomy, radiation, and adjuvant chemotherapy and 5 years of tamoxifen. She was treated by Dr. Louann Sjogren in [place]. She moved to [facility] about year ago due to job change. She has been very compliant with annual screening mammogram.  She had hysterectomy and bilateral oophorectomy and sphincterectomy 5 years ago for heavy bleeding.. She has been having hot flashes since then, moderate, but manageable. She is single, lives alone, works for 2 to [place] has to go up children who live in [place].  CURRENT THERAPY: Adjuvant chemotherapy with docetaxel, carboplatin, Herceptin and pejeta (TCHP) every 3 weeks started on 01/06/2016  Tensed returns for follow-up and 4th cycle chemo. She has been tolerating chemotherapy well overall, she still has moderate to severe diarrhea 4. Days after chemotherapy, then watery bowel movement after each meal afterwards. Diarrhea resolved a week ago,  she is being drinking very well. She came back for IV fluids one week after her chemotherapy, which helped. She was able to keep herself hydrated, eating well, weight is stable. No other complaints, no fever or chills.  MED ICAL HISTORY:  Past Medical History  Diagnosis Date  . MVP (mitral valve prolapse)   .  Infection     UTI  . Anxiety   . Fibroid   . Cancer [facility])     left brast lumpectomy   . Breast cancer of upper-outer quadrant of right female breast ([place]) 09/09/2015  . Diabetes mellitus without complication ([place])   . Depression   . GERD (gastroesophageal reflux disease)   . Complication of anesthesia     slow to wake up- BP was low, fainted next day-  . Breast cancer ([place]) 09/07/15    right breast  . Allergy   . UTI (lower urinary tract infection)   . Neuromuscular disorder ([place])     sciatic nerve paion left side/leg    SURGICAL HISTORY: Past Surgical History  Procedure Laterality Date  . Abdominal hysterectomy    . Breast surgery  2004    left lumpectomy  . Oophorectomy Bilateral   . Mastectomy w/ sentinel node biopsy Bilateral 11/25/2015    Procedure: BILATERAL SKIN SPARING MASTECTOMIES WITH RIGHT SENTINEL LYMPH NODE BIOPSY;  Surgeon: Stark Klein, MD;  Location: [place];  Service: General;  Laterality: Bilateral;  . Breast reconstruction with placement of tissue expander and flex hd (acellular hydrated dermis) Bilateral 11/25/2015    Procedure: BILATERAL IMMEDIATE BREAST RECONSTRUCTION WITH PLACEMENT OF TISSUE EXPANDERS AND FLEX HD (ACELLULAR HYDRATED DERMIS);  Surgeon: Wallace Going, DO;  Location: [place];  Service: Plastics;  Laterality: Bilateral;  . Portacath placement Left 12/20/2015    Procedure: INSERTION PORT-A-CATH, left chest;  Surgeon: Stark Klein, MD;  Location: [place] OR;  Service: General;  Laterality: Left;    SOCIAL HISTORY: Social History   Social History  . Marital Status: Single     Spouse Name: N/A  . Number of Children: 2 children, 102 daughter and 5 yo son    . Years of Education: N/A   Occupational History  . She is a Barista rep   Social History Main Topics  . Smoking status: Never Smoker   . Smokeless tobacco: Never Used  . Alcohol Use: Yes     Comment: 2-3/wk  . Drug Use: No  . Sexual Activity: Yes     Birth Control/ Protection: Surgical   Other Topics Concern  . Not on file   Social History Narrative    FAMILY HISTORY: Family History  Problem Relation Age of Onset  . Hypertension Mother   . Stroke Mother   . Alzheimer's disease Mother   . Heart disease Father   . Stroke Father   . Heart attack Father   . Breast cancer Sister 43    double mastectomy  . Other Sister     "stomach tumor that wrapped around reproductive organs"; dx. 56s; required partial hysterectomy  . Alzheimer's disease Maternal Grandmother   . Alzheimer's disease Paternal Grandmother   . Other Other     had to have lymph nodes removed and radiation; dx. 24s  . Cancer Maternal Aunt     unspecified type; dx. <50  . Colon cancer Maternal Uncle     dx. 20s  . Breast cancer Cousin     dx. 81s    ALLERGIES:  is allergic  to lisinopril; oxycodone; and percocet.  MEDICATIONS:  Current Outpatient Prescriptions  Medication Sig Dispense Refill  . dexamethasone (DECADRON) 4 MG tablet Take 2 tablets (8 mg total) by mouth 2 (two) times daily. Start the day before Taxotere. Then again the day after chemo for 3 days. 30 tablet 1  . diazepam (VALIUM) 2 MG tablet Take 2 mg by mouth every 6 (six) hours as needed for muscle spasms. Reported on 12/22/2015    . lidocaine-prilocaine (EMLA) cream Apply to affected area once 30 g 3  . magic mouthwash SOLN Take 5 mLs by mouth 4 (four) times daily as needed for mouth pain. 320 mL 1  . metFORMIN (GLUCOPHAGE) 500 MG tablet Take 500 mg by mouth 2 (two) times daily with a meal. Reported on 12/22/2015  0  . mirtazapine (REMERON) 30 MG tablet Take 1 tablet (30 mg total) by mouth at bedtime. (Patient taking differently: Take 30 mg by mouth at bedtime as needed. ) 30 tablet 3  . omeprazole (PRILOSEC) 20 MG capsule Take 1 capsule (20 mg total) by mouth daily. 14 capsule 0  . ondansetron (ZOFRAN) 8 MG tablet Take 1 tablet (8 mg total) by mouth 2 (two) times daily. Start the day after  chemo for 3 days. Then take as needed for nausea or vomiting. 30 tablet 1  . polyethylene glycol (MIRALAX / GLYCOLAX) packet Take 17 g by mouth daily as needed for mild constipation. 14 each 0  . potassium chloride SA (K-DUR,KLOR-CON) 20 MEQ tablet Take 1 tablet (20 mEq total) by mouth daily. 30 tablet 1  . prochlorperazine (COMPAZINE) 10 MG tablet Take 1 tablet (10 mg total) by mouth every 6 (six) hours as needed (Nausea or vomiting). 30 tablet 1  . zolpidem (AMBIEN) 5 MG tablet Take 1-2 tablets (5-10 mg total) by mouth at bedtime as needed for sleep. 50 tablet 1   No current facility-administered medications for this visit.   Facility-Administered Medications Ordered in Other Visits  Medication Dose Route Frequency Provider Last Rate Last Dose  . sodium chloride 0.9 % injection 10 mL  10 mL Intracatheter PRN Truitt Merle, MD   10 mL at 01/06/16 1730    REVIEW OF SYSTEMS:   Constitutional: Denies fevers, chills or abnormal night sweats Eyes: Denies blurriness of vision, double vision or watery eyes Ears, nose, mouth, throat, and face: Denies mucositis or sore throat Respiratory: Denies cough, dyspnea or wheezes Cardiovascular: Denies palpitation, chest discomfort or lower extremity swelling Gastrointestinal:  Denies nausea, heartburn or change in bowel habits Skin: Denies abnormal skin rashes Lymphatics: Denies new lymphadenopathy or easy bruising Neurological:Denies numbness, tingling or new weaknesses Behavioral/Psych: Mood is stable, no new changes  All other systems were reviewed with the patient and are negative.  PHYSICAL EXAMINATION: ECOG PERFORMANCE STATUS: 1  Filed Vitals:   03/09/16 0949  BP: 112/73  Pulse: 95  Temp: 98.7 F (37.1 C)  Resp: 18   Filed Weights   03/09/16 0949  Weight: 155 lb 14.4 oz (70.716 kg)    GENERAL:alert, no distress and comfortable SKIN: skin color, texture, turgor are normal, no rashes or significant lesions EYES: normal, conjunctiva are  pink and non-injected, sclera clear OROPHARYNX:no exudate, no erythema and lips, buccal mucosa, and tongue normal  NECK: supple, thyroid normal size, non-tender, without nodularity LYMPH:  no palpable lymphadenopathy in the cervical, axillary or inguinal LUNGS: clear to auscultation and percussion with normal breathing effort HEART: regular rate & rhythm and no murmurs and no lower extremity  edema ABDOMEN:abdomen soft, non-tender and normal bowel sounds Musculoskeletal:no cyanosis of digits and no clubbing  PSYCH: alert & oriented x 3 with fluent speech NEURO: no focal motor/sensory deficits Breasts: s/p bilateral mastectomy and tissue spender placement. Surgical incision sites are clean, no surrounding skin erythema or discharge.    LABORATORY DATA:  I have reviewed the data as listed CBC Latest Ref Rng 03/09/2016 02/17/2016 02/02/2016  WBC 3.9 - 10.3 10e3/uL 6.6 7.3 15.9(H)  Hemoglobin 11.6 - 15.9 g/dL 11.7 11.6 12.9  Hematocrit 34.8 - 46.6 % 34.8 34.7(L) 37.8  Platelets 145 - 400 10e3/uL 278 291 216    CMP Latest Ref Rng 03/09/2016 02/17/2016 02/02/2016  Glucose 70 - 140 mg/dl 92 123 119  BUN 7.0 - 26.0 mg/dL 6.0(L) 7.5 7.8  Creatinine 0.6 - 1.1 mg/dL 0.8 0.8 0.9  Sodium 136 - 145 mEq/L 143 141 139  Potassium 3.5 - 5.1 mEq/L 3.4(L) 3.4(L) 3.3(L)  CO2 22 - 29 mEq/L _0 Calcium 8.4 - 10.4 mg/dL 9.1 9.1 9.8  Total Protein 6.4 - 8.3 g/dL 7.3 7.4 8.5(H)  Total Bilirubin 0.20 - 1.20 mg/dL 0.46 0.38 0.46  Alkaline Phos 40 - 150 U/L 83 99 134  AST 5 - 34 U/L 37(H) 16 29  ALT 0 - 55 U/L 59(H) 28 57(H)     Pathology report Diagnosis 11/25/2015 1. Breast, simple mastectomy, right - INVASIVE DUCTAL CARCINOMA, GRADE 2/3, SPANNING 2.0 CM. - DUCTAL CARCINOMA IN SITU, INTERMEDIATE GRADE. - LYMPHOVASCULAR INVASION IS IDENTIFIED. - LOBULAR NEOPLASIA (ATYPICAL LOBULAR HYPERPLASIA). - THE SURGICAL RESECTION MARGINS ARE NEGATIVE FOR CARCINOMA. - SEE ONCOLOGY TABLE BELOW. 2. Lymph node,  sentinel, biopsy, right axillary #1 - METASTATIC CARCINOMA IN 1 OF 1 LYMPH NODE (1/1). 3. Lymph node, sentinel, biopsy, right axillary #2 - THERE IS NO EVIDENCE OF CARCINOMA IN 1 OF 1 LYMPH NODE (0/1). 4. Lymph node, sentinel, biopsy, right axillary #3 - THERE IS NO EVIDENCE OF CARCINOMA IN 1 OF 1 LYMPH NODE (0/1). 5. Breast, simple mastectomy, left - BENIGN BREAST PARENCHYMA WITH DENSE STROMAL FIBROSIS. - FIBROADENOMA. - THERE IS NO EVIDENCE OF MALIGNANCY. 6. Breast, excision, left, additional lateral margin - BENIGN FIBROADIPOSE TISSUE. - THERE IS NO EVIDENCE OF MALIGNANCY. - SEE COMMENT. Microscopic Comment 1. BREAST, INVASIVE TUMOR, WITH LYMPH NODES PRESENT Specimen, including laterality and lymph node sampling (sentinel, non-sentinel): Right breast and right axillary lymph nodes Procedure: Bilateral mastectomy and multiple right axillary lymph node resections Histologic type: Ductal Grade: 2 Tubule formation: 2 1 of 4 FINAL for Neumeister, Latana ([id number]) Microscopic Comment(continued) Nuclear pleomorphism: 2 Mitotic: 2 Tumor size (gross measurement): 2.0 cm Margins: Negative for carcinoma Invasive, distance to closest margin: 1.8 cm to the posterior margin In-situ, distance to closest margin: 1.8 cm to the posterior margin Lymphovascular invasion: Present Ductal carcinoma in situ: Present Grade: Intermediate grade Extensive intraductal component: Not identified Lobular neoplasia: Present, atypical lobular hyperplasia Tumor focality: Unifocal Treatment effect: Not identified Extent of tumor: Confined to breast parenchyma Lymph nodes: Examined: 3 Sentinel 0 Non-sentinel 3 Total Lymph nodes with metastasis: 1 (macrometastasis) - no extracapsular extension is identified. Breast prognostic profile: [phone] Estrogen receptor: 100%, strong staining intensity Progesterone receptor: 100%, strong staining intensity Her 2 neu: Amplification was detected. The ratio  was 2.58 Ki-67: 60% TNM: pT1c, pN1a Non-neoplastic breast: No significant findings. 6. The surgical resection margin(s) of the specimen were inked and microscopically evaluated. Enid Cutter MD Pathologist, Electronic Signature (Case signed 11/29/2015)   RADIOGRAPHIC STUDIES: I have personally  reviewed the radiological images as listed and agreed with the findings in the report.  Bone scan 12/28/2014 IMPRESSION: Foci of increased tracer localization at the sternum, unable to exclude metastases.  Uptake at inferior cervical spine could potentially be related to degenerative disc disease changes present at C5-C6 on CT.  Questionable abnormal increased tracer localization at the posterior LEFT ninth and tenth ribs.  CT chest, abdomen and pelvis 12/28/2014 IMPRESSION: 1. 2 cm enhancing lesion in segment 5 of the liver is indeterminate. It could reflect FNH, vascular shunt and less likely metastasis. Recommend MRI abdomen without and with contrast using Eovist for further evaluation. No other hepatic lesions. 2. No findings for metastatic disease involving the chest. There is a focal area of airspace disease in the superior segment of the right lower lobe which could be a developing or resolving infiltrate. Recommend correlation with clinical symptoms.  ECHO 12/21/2015 Study Conclusions  - Left ventricle: The cavity size was normal. Wall thickness was increased in a pattern of mild LVH. Systolic function was normal. The estimated ejection fraction was in the range of 55% to 60%. Wall motion was normal; there were no regional wall motion abnormalities. Global longitudinal strain: -18.5% There was no evidence of elevated ventricular filling pressure by Doppler parameters.  ASSESSMENT & PLAN:  50 year old African-American female, surgical postmenopausal, history of stage I left breast cancer, with newly diagnosed right stage I breast cancer.  1. Right breast invasive ductal  carcinoma, pT1cN1aM0, stage IIB, grade 2, ER100%+/PR100%+/HER2+ -I previously reviewed her surgical pathology results with patient in great details. She has 1 out of 3 sentinel lymph nodes positive, her pathological stage is more advanced than her initial clinical stage.  -We reviewed the natural history of triple positive breast cancer. HER-2 positive tumors are more progressive, with higher risk of recurrence -I reviewed her restaging CT scan and bone scan images with her in person. I discussed the bone scan findings with radiologist Dr. Thornton Papas. The mild hypermetabolic uptake in the sternum and left ninth and 10th rib have no significant corresponding change on the CT scan, except for small lytic spot in the sternum. They're not amenable for biopsy, PET scan may not have much additional value, the suspicion for metastases from breast cancer is low. I recommend follow-up. The 2 cm lesion in the liver are likely benign, unfortunately she is not able to do liver MRI, due to her breast implants.  -She is still concerned about the above scan findings. I recommend to have a repeated CT or PET scan a few months after she completes adjuvant chemotherapy for follow-up. -Given her high risk of cancer recurrence, I recommend adjuvant chemotherapy with docetaxel, carboplatin, Herceptin and Perjeta (TCHP), every 3 weeks for 6 cycles,  followed by maintenance Herceptin to complete a 1 year therapy. She previously received Adriamycin, will not be able to tolerate more adrimycin. -She is tolerating chemotherapy moderately well, with the main side effects of diarrhea, which is manageable. Lab results reviewed with her, adequate for treatment, we'll continue.  -she plans to have implant surgery before radiation   2. Diarrhea  -Secondary to chemotherapy. -I recommend her to use Imodium as needed but she is afraid of using it due to pneumonia related constipation. She drinks fluids adequately to avoid dehydration. -I'll  schedule her to return in 5 days for IV fluids.  3. Genetics -Due to her young age and recurrent breast cancer, she was referred to see a Dietitian in our cancer center. -Her genetic  test was negative  4. DM -she will continue follow-up with her primary care physician -She is on metformin -We again reviewed steroids induced hyperglycemia, she'll monitor her sugar closely at home, especially after chemotherapy.  4. Bone health -She is postmenopausal by surgery -I encouraged her to take calcium and vitamin D -I'll obtain a baseline bone density scan   5. Insomnia and low appetite  -continue mirtazapine -I give her a prescription of Ambien today  Plan for today  -Lab reviewed, adequate for treatment, cycle 4 today.  -IVF in 5 days  -RTC in 3 weeks with lab, f/p and chemo   All questions were answered. The patient knows to call the clinic with any problems, questions or concerns.  I spent 25 minutes counseling the patient face to face. The total time spent in the appointment was 30 minutes and more than 50% was on counseling.     Truitt Merle, MD 03/09/2016

## 2016-03-09 NOTE — Progress Notes (Signed)
Reviewed labs with Dr. Burr Medico, okay to treat with 03/09/16 Cmet ( potassium 3.4, ALT 59, AST 37). Pt instructed to increase to taking two potassium pills (40MEQs) a day while experiencing diarrhea per Dr. Burr Medico. Pt verbalizes understanding.

## 2016-03-12 ENCOUNTER — Emergency Department (HOSPITAL_COMMUNITY): Payer: BLUE CROSS/BLUE SHIELD

## 2016-03-12 ENCOUNTER — Encounter (HOSPITAL_COMMUNITY): Payer: Self-pay | Admitting: Emergency Medicine

## 2016-03-12 ENCOUNTER — Emergency Department (HOSPITAL_COMMUNITY)
Admission: EM | Admit: 2016-03-12 | Discharge: 2016-03-12 | Disposition: A | Discharge status: Home / Self Care | Payer: BLUE CROSS/BLUE SHIELD | Attending: Emergency Medicine | Admitting: Emergency Medicine

## 2016-03-12 ENCOUNTER — Other Ambulatory Visit: Payer: Self-pay

## 2016-03-12 DIAGNOSIS — R55 Syncope and collapse: Secondary | ICD-10-CM | POA: Diagnosis present

## 2016-03-12 DIAGNOSIS — F329 Major depressive disorder, single episode, unspecified: Secondary | ICD-10-CM | POA: Insufficient documentation

## 2016-03-12 DIAGNOSIS — Z853 Personal history of malignant neoplasm of breast: Secondary | ICD-10-CM | POA: Diagnosis not present

## 2016-03-12 DIAGNOSIS — Z8679 Personal history of other diseases of the circulatory system: Secondary | ICD-10-CM | POA: Insufficient documentation

## 2016-03-12 DIAGNOSIS — R197 Diarrhea, unspecified: Secondary | ICD-10-CM | POA: Diagnosis not present

## 2016-03-12 DIAGNOSIS — R300 Dysuria: Secondary | ICD-10-CM | POA: Diagnosis not present

## 2016-03-12 DIAGNOSIS — R109 Unspecified abdominal pain: Secondary | ICD-10-CM | POA: Diagnosis not present

## 2016-03-12 DIAGNOSIS — Z87440 Personal history of urinary (tract) infections: Secondary | ICD-10-CM | POA: Diagnosis not present

## 2016-03-12 DIAGNOSIS — Z8669 Personal history of other diseases of the nervous system and sense organs: Secondary | ICD-10-CM | POA: Diagnosis not present

## 2016-03-12 DIAGNOSIS — Z8719 Personal history of other diseases of the digestive system: Secondary | ICD-10-CM | POA: Insufficient documentation

## 2016-03-12 DIAGNOSIS — E119 Type 2 diabetes mellitus without complications: Secondary | ICD-10-CM | POA: Insufficient documentation

## 2016-03-12 DIAGNOSIS — F419 Anxiety disorder, unspecified: Secondary | ICD-10-CM | POA: Diagnosis not present

## 2016-03-12 DIAGNOSIS — E860 Dehydration: Secondary | ICD-10-CM | POA: Diagnosis not present

## 2016-03-12 DIAGNOSIS — Z86018 Personal history of other benign neoplasm: Secondary | ICD-10-CM | POA: Insufficient documentation

## 2016-03-12 LAB — URINALYSIS, ROUTINE W REFLEX MICROSCOPIC
Bilirubin Urine: NEGATIVE
GLUCOSE, UA: NEGATIVE mg/dL
HGB URINE DIPSTICK: NEGATIVE
Ketones, ur: NEGATIVE mg/dL
LEUKOCYTES UA: NEGATIVE
Nitrite: NEGATIVE
PH: 6.5 (ref 5.0–8.0)
PROTEIN: NEGATIVE mg/dL
Specific Gravity, Urine: 1.007 (ref 1.005–1.030)

## 2016-03-12 LAB — CBC WITH DIFFERENTIAL/PLATELET
BASOS PCT: 0 %
Basophils Absolute: 0 10*3/uL (ref 0.0–0.1)
EOS PCT: 0 %
Eosinophils Absolute: 0 10*3/uL (ref 0.0–0.7)
HEMATOCRIT: 33.1 % — AB (ref 36.0–46.0)
HEMOGLOBIN: 11.3 g/dL — AB (ref 12.0–15.0)
Lymphocytes Relative: 6 %
Lymphs Abs: 1.8 10*3/uL (ref 0.7–4.0)
MCH: 30.2 pg (ref 26.0–34.0)
MCHC: 34.1 g/dL (ref 30.0–36.0)
MCV: 88.5 fL (ref 78.0–100.0)
MONO ABS: 0.3 10*3/uL (ref 0.1–1.0)
MONOS PCT: 1 %
NEUTROS PCT: 93 %
Neutro Abs: 28.4 10*3/uL — ABNORMAL HIGH (ref 1.7–7.7)
Platelets: 209 10*3/uL (ref 150–400)
RBC: 3.74 MIL/uL — AB (ref 3.87–5.11)
RDW: 16.5 % — ABNORMAL HIGH (ref 11.5–15.5)
WBC: 30.5 10*3/uL — AB (ref 4.0–10.5)

## 2016-03-12 LAB — COMPREHENSIVE METABOLIC PANEL
ALT: 78 U/L — AB (ref 14–54)
AST: 51 U/L — AB (ref 15–41)
Albumin: 4.4 g/dL (ref 3.5–5.0)
Alkaline Phosphatase: 85 U/L (ref 38–126)
Anion gap: 10 (ref 5–15)
BUN: 11 mg/dL (ref 6–20)
CHLORIDE: 103 mmol/L (ref 101–111)
CO2: 27 mmol/L (ref 22–32)
CREATININE: 0.62 mg/dL (ref 0.44–1.00)
Calcium: 9.2 mg/dL (ref 8.9–10.3)
Glucose, Bld: 94 mg/dL (ref 65–99)
POTASSIUM: 3.2 mmol/L — AB (ref 3.5–5.1)
SODIUM: 140 mmol/L (ref 135–145)
Total Bilirubin: 0.9 mg/dL (ref 0.3–1.2)
Total Protein: 7.5 g/dL (ref 6.5–8.1)

## 2016-03-12 LAB — I-STAT CG4 LACTIC ACID, ED
LACTIC ACID, VENOUS: 0.84 mmol/L (ref 0.5–2.0)
Lactic Acid, Venous: 1.32 mmol/L (ref 0.5–2.0)

## 2016-03-12 LAB — CBG MONITORING, ED: Glucose-Capillary: 88 mg/dL (ref 65–99)

## 2016-03-12 MED ORDER — SODIUM CHLORIDE 0.9 % IV BOLUS (SEPSIS)
1000.0000 mL | Freq: Once | INTRAVENOUS | Status: AC
Start: 2016-03-12 — End: 2016-03-12
  Administered 2016-03-12: 1000 mL via INTRAVENOUS

## 2016-03-12 MED ORDER — HEPARIN SOD (PORK) LOCK FLUSH 100 UNIT/ML IV SOLN
500.0000 [IU] | Freq: Once | INTRAVENOUS | Status: AC
  Administered 2016-03-12: 500 [IU]
  Filled 2016-03-12: qty 5

## 2016-03-12 MED ORDER — POTASSIUM CHLORIDE CRYS ER 20 MEQ PO TBCR
40.0000 meq | EXTENDED_RELEASE_TABLET | Freq: Once | ORAL | Status: AC
  Administered 2016-03-12: 40 meq via ORAL
  Filled 2016-03-12: qty 2

## 2016-03-12 NOTE — ED Notes (Signed)
Pt eating and drinking, states she feels beter and ready to go home, MD made aware

## 2016-03-12 NOTE — ED Provider Notes (Signed)
CSN: [id number]     Arrival date & time 03/12/16  1500 History   First MD Initiated Contact with Patient 03/12/16 1530     Chief Complaint  Patient presents with  . Loss of Consciousness     (Consider location/radiation/quality/duration/timing/severity/associated sxs/prior Treatment) HPI Comments: Chemotherapy on Thursday Typically get diarrhea after chemotherapy Been eating, but not wanting to drink anything because nothing tastes good, thought was dehydrated Diarrhea every time she eats, difficult to say how many times a day Diarrhea is similar to prior, however this time can't take fluids, not tolerating fluids No vomiting, no nausea No CP/SOB Stomach pain, cramping right before diarrhea  Syncope last night x1, patient reports she is a fainter, and was sitting on toilet and felt very lightheaded Was crawling on knees feeling lightheaded trying to get to bed (was trying to avoid fall/hitting head) Did not fall this time Hx of syncope while in labor with children, faints in response to pain/emotion     Patient is a 50 y.o. female presenting with syncope.  Loss of Consciousness Associated symptoms: no chest pain, no fever, no headaches, no nausea, no shortness of breath, no vomiting and no weakness     Past Medical History  Diagnosis Date  . MVP (mitral valve prolapse)   . Infection     UTI  . Anxiety   . Fibroid   . Cancer [facility])     left brast lumpectomy   . Breast cancer of upper-outer quadrant of right female breast ([place]) 09/09/2015  . Diabetes mellitus without complication ([place])   . Depression   . GERD (gastroesophageal reflux disease)   . Complication of anesthesia     slow to wake up- BP was low, fainted next day-  . Breast cancer ([place]) 09/07/15    right breast  . Allergy   . UTI (lower urinary tract infection)   . Neuromuscular disorder ([place])     sciatic nerve paion left side/leg   Past Surgical History  Procedure Laterality Date  . Abdominal hysterectomy     . Breast surgery  2004    left lumpectomy  . Oophorectomy Bilateral   . Mastectomy w/ sentinel node biopsy Bilateral 11/25/2015    Procedure: BILATERAL SKIN SPARING MASTECTOMIES WITH RIGHT SENTINEL LYMPH NODE BIOPSY;  Surgeon: Stark Klein, MD;  Location: [place];  Service: General;  Laterality: Bilateral;  . Breast reconstruction with placement of tissue expander and flex hd (acellular hydrated dermis) Bilateral 11/25/2015    Procedure: BILATERAL IMMEDIATE BREAST RECONSTRUCTION WITH PLACEMENT OF TISSUE EXPANDERS AND FLEX HD (ACELLULAR HYDRATED DERMIS);  Surgeon: Wallace Going, DO;  Location: [place];  Service: Plastics;  Laterality: Bilateral;  . Portacath placement Left 12/20/2015    Procedure: INSERTION PORT-A-CATH, left chest;  Surgeon: Stark Klein, MD;  Location[facility];  Service: General;  Laterality: Left;   Family History  Problem Relation Age of Onset  . Hypertension Mother   . Stroke Mother   . Alzheimer's disease Mother   . Heart disease Father   . Stroke Father   . Heart attack Father   . Breast cancer Sister 62    double mastectomy  . Other Sister     "stomach tumor that wrapped around reproductive organs"; dx. 74s; required partial hysterectomy  . Alzheimer's disease Maternal Grandmother   . Alzheimer's disease Paternal Grandmother   . Other Other     had to have lymph nodes removed and radiation; dx. 66s  . Cancer  Maternal Aunt     unspecified type; dx. <50  . Colon cancer Maternal Uncle     dx. 73s  . Breast cancer Cousin     dx. 47s   Social History  Substance Use Topics  . Smoking status: Never Smoker   . Smokeless tobacco: Never Used     Comment: previous secondhand smoke exposure  . Alcohol Use: 2.4 oz/week    4 Glasses of wine per week     Comment: 1 bottle of wine per wk   OB History    Gravida Para Term Preterm AB TAB SAB Ectopic Multiple Living   2 2 1 1  0 0 0 0 0 2     Review of Systems  Constitutional:  Negative for fever.  HENT: Negative for sore throat.   Eyes: Negative for visual disturbance.  Respiratory: Negative for cough and shortness of breath.   Cardiovascular: Positive for syncope. Negative for chest pain.  Gastrointestinal: Positive for abdominal pain and diarrhea. Negative for nausea, vomiting, constipation and blood in stool.  Genitourinary: Positive for dysuria (since starting chemo, no change). Negative for difficulty urinating.  Musculoskeletal: Negative for back pain and neck pain.  Skin: Negative for rash.  Neurological: Positive for syncope and light-headedness. Negative for facial asymmetry, weakness, numbness and headaches.      Allergies  Lisinopril; Oxycodone; and Percocet  Home Medications   Prior to Admission medications   Medication Sig Start Date End Date Taking? Authorizing Provider  diazepam (VALIUM) 2 MG tablet Take 2 mg by mouth every 6 (six) hours as needed for muscle spasms. Reported on 12/22/2015   Yes Historical Provider, MD  fluticasone (FLONASE) 50 MCG/ACT nasal spray Place 1 spray into both nostrils daily as needed for allergies.  02/03/16  Yes Historical Provider, MD  ibuprofen (ADVIL,MOTRIN) 800 MG tablet Take 800 mg by mouth daily as needed for headache or moderate pain.   Yes Historical Provider, MD  magic mouthwash SOLN Take 5 mLs by mouth 4 (four) times daily as needed for mouth pain. 01/14/16  Yes Owens Shark, NP  metFORMIN (GLUCOPHAGE) 500 MG tablet Take 500 mg by mouth 2 (two) times daily as needed (high blood sugar). Reported on 12/22/2015 08/30/15  Yes Historical Provider, MD  mirtazapine (REMERON) 30 MG tablet Take 1 tablet (30 mg total) by mouth at bedtime. Patient taking differently: Take 30 mg by mouth at bedtime as needed.  01/06/16  Yes Truitt Merle, MD  potassium chloride SA (K-DUR,KLOR-CON) 20 MEQ tablet Take 1 tablet (20 mEq total) by mouth daily. 01/27/16  Yes Truitt Merle, MD  zolpidem (AMBIEN) 5 MG tablet Take 1-2 tablets (5-10 mg total) by  mouth at bedtime as needed for sleep. 02/17/16  Yes Truitt Merle, MD  dexamethasone (DECADRON) 4 MG tablet Take 2 tablets (8 mg total) by mouth 2 (two) times daily. Start the day before Taxotere. Then again the day after chemo for 3 days. 12/15/15   Truitt Merle, MD  lidocaine-prilocaine (EMLA) cream Apply to affected area once 12/15/15   Truitt Merle, MD  ondansetron (ZOFRAN) 8 MG tablet Take 1 tablet (8 mg total) by mouth 2 (two) times daily. Start the day after chemo for 3 days. Then take as needed for nausea or vomiting. 12/15/15   Truitt Merle, MD  polyethylene glycol [facility] Floria Raveling) packet Take 17 g by mouth daily as needed for mild constipation. 11/26/15   Shawn M Rayburn, PA-C  prochlorperazine (COMPAZINE) 10 MG tablet Take 1 tablet (10  mg total) by mouth every 6 (six) hours as needed (Nausea or vomiting). 12/15/15   Truitt Merle, MD   BP 114/76 mmHg  Pulse 97  Temp(Src) 98.3 F (36.8 C) (Oral)  Resp 18  Ht 5\' 6"  (1.676 m)  Wt 155 lb (70.308 kg)  BMI 25.03 kg/m2  SpO2 96% Physical Exam  Constitutional: She is oriented to person, place, and time. She appears well-developed and well-nourished. No distress.  HENT:  Head: Normocephalic and atraumatic.  Eyes: Conjunctivae and EOM are normal.  Neck: Normal range of motion.  Cardiovascular: Normal rate, regular rhythm, normal heart sounds and intact distal pulses.  Exam reveals no gallop and no friction rub.   No murmur heard. Pulmonary/Chest: Effort normal and breath sounds normal. No respiratory distress. She has no wheezes. She has no rales.  Right chest port  Abdominal: Soft. She exhibits no distension. There is no tenderness. There is no guarding.  Musculoskeletal: She exhibits no edema or tenderness.  Neurological: She is alert and oriented to person, place, and time.  Skin: Skin is warm and dry. No rash noted. She is not diaphoretic. No erythema.  Nursing note and vitals reviewed.   ED Course  Procedures (including critical care time) Labs  Review Labs Reviewed  COMPREHENSIVE METABOLIC PANEL - Abnormal; Notable for the following:    Potassium 3.2 (*)    AST 51 (*)    ALT 78 (*)    All other components within normal limits  CBC WITH DIFFERENTIAL/PLATELET - Abnormal; Notable for the following:    WBC 30.5 (*)    RBC 3.74 (*)    Hemoglobin 11.3 (*)    HCT 33.1 (*)    RDW 16.5 (*)    Neutro Abs 28.4 (*)    All other components within normal limits  CULTURE, BLOOD (ROUTINE X 2)  URINE CULTURE  URINALYSIS, ROUTINE W REFLEX MICROSCOPIC (NOT AT [facility])  I-STAT CG4 LACTIC ACID, ED  CBG MONITORING, ED  I-STAT CG4 LACTIC ACID, ED    Imaging Review Dg Chest 2 View  03/12/2016  CLINICAL DATA:  History of breast cancer undergoing chemotherapy. Syncopal episode last night following chemotherapy. EXAM: CHEST  2 VIEW COMPARISON:  CT chest 12/29/2015 FINDINGS: Left subclavian Port-A-Cath with tip over the SVC. Lungs are adequately inflated without focal consolidation or effusion. Cardiomediastinal silhouette is within normal. Evidence of patient's bilateral mastectomy with tissue expanders present. Surgical clips over the right axilla. Remainder of the exam is within normal. IMPRESSION: No acute cardiopulmonary disease. Electronically Signed   By: Marin Olp M.D.   On: 03/12/2016 15:30   I have personally reviewed and evaluated these images and lab results as part of my medical decision-making.   EKG Interpretation   Date/Time:  Sunday March 12 2016 16:08:12 EDT Ventricular Rate:  92 PR Interval:  160 QRS Duration: 89 QT Interval:  386 QTC Calculation: 477 R Axis:   72 Text Interpretation:  Sinus rhythm Left ventricular hypertrophy Baseline  wander in lead(s) V5 No significant change since last tracing Confirmed by  [facility] MD, Cristoval Teall ([id number]) on 03/12/2016 4:22:51 PM Also confirmed by  [facility] MD, Jordan ([id number]), editor [place]  CCT, BEVERLY ([id number])  on  03/13/2016 7:05:26 AM      MDM   Final diagnoses:  Syncope,  unspecified syncope type  Dehydration   50 yo female with a history of breast cancer under chemotherapy, diabetes presents with concern for syncopal episode in setting of having postchemotherapy diarrhea and decreased by mouth intake.  Patient without chest pain or shortness of breath, no tachypnea, no hypoxia with suspicion for pulmonary embolus as cause of syncope.  EKG evaluated by me and shows sinus rhythm with no sign of prolonged QTc, no brugada, no sign of HOCM, no ST abnormalities.   She denies fever, cough, other infectious symptoms. She has diarrhea chronically after chemotherapy, and reports this has not changed, however reports she doesn't like the taste of fluids, and has not been taking these appropriately. Patient with leukocytosis of 30,000, and is receiving neulasta, and has had similar elevation (for example 2/1 without having infectious signs of symptoms at that time.)  Given leukocytosis with diarrhea, ordered C. Diff however unable to obtain sample at this time giving lower suspicion for this as diagnosis.  Most likely etiology as patient lightheadedness and syncope is dehydration secondary to chemotherapy, decreased po intake.  Patient given IV fluids in the ED and able to tolerate po with improvement in symptoms. Patient discharged in stable condition with understanding of reasons to return.     Gareth Morgan, MD 03/13/16 1252

## 2016-03-12 NOTE — ED Notes (Signed)
Patient aware of urine sample needed.  

## 2016-03-12 NOTE — ED Notes (Addendum)
Pt with hx of breast cancer, had chemo on Thursday. Pt states she had a syncopal episode last night and feels much worse than normal following chemo. Pt c/o low back pain. Pt states she has had diarrhea since the chemo on Thursday.  Pt states she has not been able to drink much due to feeling so bad and feels she  May be dehydrated.

## 2016-03-12 NOTE — ED Notes (Signed)
Port flushed with heprin and removed.

## 2016-03-12 NOTE — ED Notes (Signed)
MD at bedside for pt f/u 

## 2016-03-12 NOTE — ED Notes (Signed)
Went to collect second set of blood cultures - pt refused.  She has a port and doesn't want to be stuck.  ( i explained why we want both sets ).  RN is aware.

## 2016-03-12 NOTE — ED Notes (Signed)
Schlossman made aware that patient refuses 2nd blood culture

## 2016-03-12 NOTE — ED Notes (Signed)
MD at bedside. 

## 2016-03-14 ENCOUNTER — Other Ambulatory Visit: Payer: Self-pay | Admitting: Hematology

## 2016-03-14 ENCOUNTER — Ambulatory Visit (HOSPITAL_BASED_OUTPATIENT_CLINIC_OR_DEPARTMENT_OTHER): Payer: BLUE CROSS/BLUE SHIELD

## 2016-03-14 VITALS — BP 107/62 | HR 105 | Temp 98.4°F | Resp 18

## 2016-03-14 DIAGNOSIS — Z853 Personal history of malignant neoplasm of breast: Secondary | ICD-10-CM

## 2016-03-14 DIAGNOSIS — C50411 Malignant neoplasm of upper-outer quadrant of right female breast: Secondary | ICD-10-CM

## 2016-03-14 DIAGNOSIS — Z5189 Encounter for other specified aftercare: Secondary | ICD-10-CM

## 2016-03-14 LAB — URINE CULTURE

## 2016-03-14 MED ORDER — SODIUM CHLORIDE 0.9 % IV SOLN
Freq: Once | INTRAVENOUS | Status: AC
  Administered 2016-03-14: 09:00:00 via INTRAVENOUS

## 2016-03-14 MED ORDER — SODIUM CHLORIDE 0.9% FLUSH
10.0000 mL | INTRAVENOUS | Status: DC | PRN
  Administered 2016-03-14: 10 mL via INTRAVENOUS
  Filled 2016-03-14: qty 10

## 2016-03-14 MED ORDER — HEPARIN SOD (PORK) LOCK FLUSH 100 UNIT/ML IV SOLN
500.0000 [IU] | Freq: Once | INTRAVENOUS | Status: AC
  Administered 2016-03-14: 500 [IU] via INTRAVENOUS
  Filled 2016-03-14: qty 5

## 2016-03-14 MED FILL — ZOLPIDEM TARTRATE 5 MG TAB: 5 | 25 days supply | Qty: 50 | Fill #1

## 2016-03-14 NOTE — Patient Instructions (Signed)

## 2016-03-17 LAB — CULTURE, BLOOD (ROUTINE X 2): CULTURE: NO GROWTH

## 2016-03-21 ENCOUNTER — Ambulatory Visit (HOSPITAL_BASED_OUTPATIENT_CLINIC_OR_DEPARTMENT_OTHER)
Admission: RE | Admit: 2016-03-21 | Discharge: 2016-03-21 | Disposition: A | Discharge status: Home / Self Care | Payer: BLUE CROSS/BLUE SHIELD | Source: Ambulatory Visit | Attending: Internal Medicine | Admitting: Internal Medicine

## 2016-03-21 ENCOUNTER — Ambulatory Visit (HOSPITAL_COMMUNITY)
Admission: RE | Admit: 2016-03-21 | Discharge: 2016-03-21 | Disposition: A | Discharge status: Home / Self Care | Payer: BLUE CROSS/BLUE SHIELD | Source: Ambulatory Visit | Attending: Family Medicine | Admitting: Family Medicine

## 2016-03-21 VITALS — BP 92/56 | HR 96 | Wt 150.5 lb

## 2016-03-21 DIAGNOSIS — Z9013 Acquired absence of bilateral breasts and nipples: Secondary | ICD-10-CM | POA: Insufficient documentation

## 2016-03-21 DIAGNOSIS — E119 Type 2 diabetes mellitus without complications: Secondary | ICD-10-CM | POA: Insufficient documentation

## 2016-03-21 DIAGNOSIS — C50411 Malignant neoplasm of upper-outer quadrant of right female breast: Secondary | ICD-10-CM | POA: Insufficient documentation

## 2016-03-21 DIAGNOSIS — T451X5A Adverse effect of antineoplastic and immunosuppressive drugs, initial encounter: Secondary | ICD-10-CM

## 2016-03-21 DIAGNOSIS — K219 Gastro-esophageal reflux disease without esophagitis: Secondary | ICD-10-CM | POA: Diagnosis not present

## 2016-03-21 DIAGNOSIS — I427 Cardiomyopathy due to drug and external agent: Secondary | ICD-10-CM

## 2016-03-21 DIAGNOSIS — Z79899 Other long term (current) drug therapy: Secondary | ICD-10-CM | POA: Diagnosis not present

## 2016-03-21 MED ORDER — CARVEDILOL 3.125 MG PO TABS: 3.1250 mg | ORAL_TABLET | Freq: Two times a day (BID) | ORAL | Status: DC

## 2016-03-21 MED FILL — CARVEDILOL 3.125 MG TABLET: 3.125 | 90 days supply | Qty: 180 | Fill #0

## 2016-03-21 NOTE — Progress Notes (Signed)
Patient ID: Marcy Siren, female   DOB: 01/29/1966, 50 y.o.   MRN: [id number] Patient ID: Oliviah Agostini, female   DOB: 1966/08/15, 50 y.o.   MRN: [id number]   CARDIO-ONCOLOGY CONSULT NOTE  Referring Physician: Burr Medico Primary Care: Kristie Cowman Primary Cardiologist:  HPI:  Nayellie Anthes is a 50 y.o. female with h/o DM2 and GERD who was diagnosed with right breast invasive ductal carcinoma pT1cN1aM0, stage IIB, grade 2, ER+/PR+, HER2+ with + 1 out of 3 + sentinel lymph nodes. Discovered on screening mammogram 09/07/15. She is now s/p bilateral mastectomy 11/30/15. She is referred to toe cardio-oncology clinic by Dr. Burr Medico  Of note, she had a left breast lumpectomy in 2004 and was treated at that time with Adriamycin.    Oncology History   Breast cancer of upper-outer quadrant of right female breast [facility])  Staging form: Breast, AJCC 7th Edition  Clinical stage from 09/15/2015: Stage IA (T1b, N0, M0) - Unsigned       Breast cancer of upper-outer quadrant of right female breast ([place])   09/07/2015 Mammogram Diagnostic mammogram and the ultrasound of the right breast showed a 0.8 cm lobulated mass in the right breast 11 to 12:00 position 9 cm from the nipple. No other lesions or adenopathy.   09/08/2015 Initial Diagnosis Breast cancer of upper-outer quadrant of right female breast ([place])   09/08/2015 Initial Biopsy Right breast mass at the upper outer quadrant core needle biopsy showed invasive ductal carcinoma, grade 2-3, ductal carcinoma in situ.   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 Left breast ostectomy was negative for  malignancy.       She presents today for f/u  in the AHF/cardio-oncology clinic. She feels well. Tolerating chemo and herceptin/perjeta. Gets lightheaded and poor appetite afterward. Has los 5 pounds.  Still in adjuvant phase - 4/6 cycles then scheduled for 1 year of herceptin. (started 01/07/16) Denies exertional CP, SOB or edema.   ECHO 12/21/15 55-60% Lateral s' 11.0 GLS -18.5% no MVP ECHO 03/21/16 50-55% lateral s' 9.2 cm/s GLS -16.1%   Past Medical History  Diagnosis Date  . MVP (mitral valve prolapse)   . Infection     UTI  . Anxiety   . Fibroid   . Cancer [facility])     left brast lumpectomy   . Breast cancer of upper-outer quadrant of right female breast ([place]) 09/09/2015  . Diabetes mellitus without complication ([place])   . Depression   . GERD (gastroesophageal reflux disease)   . Complication of anesthesia     slow to wake up- BP was low, fainted next day-  . Breast cancer ([place]) 09/07/15    right breast  . Allergy   . UTI (lower urinary tract infection)   . Neuromuscular disorder ([place])     sciatic nerve paion left side/leg    Current Outpatient Prescriptions  Medication Sig Dispense Refill  . dexamethasone (DECADRON) 4 MG tablet Take 2 tablets (8 mg total) by mouth 2 (two) times daily. Start the day before Taxotere. Then again the day after chemo for 3 days. 30 tablet 1  . fluticasone (FLONASE) 50 MCG/ACT nasal spray Place 1 spray into both nostrils daily as  needed for allergies.   0  . lidocaine-prilocaine (EMLA) cream Apply to affected area once 30 g 3  . potassium chloride SA (K-DUR,KLOR-CON) 20 MEQ tablet Take 1 tablet (20 mEq total) by mouth daily. 30 tablet 1  . zolpidem (AMBIEN) 5 MG tablet Take 1-2 tablets (5-10 mg total) by mouth at bedtime as needed for sleep. 50 tablet 1  . ibuprofen (ADVIL,MOTRIN) 800 MG tablet Take 800 mg by mouth daily as needed for headache or moderate pain.    . magic mouthwash SOLN Take 5 mLs by mouth 4 (four) times daily as needed for mouth  pain. 320 mL 1  . ondansetron (ZOFRAN) 8 MG tablet Take 1 tablet (8 mg total) by mouth 2 (two) times daily. Start the day after chemo for 3 days. Then take as needed for nausea or vomiting. 30 tablet 1  . polyethylene glycol (MIRALAX / GLYCOLAX) packet Take 17 g by mouth daily as needed for mild constipation. 14 each 0  . prochlorperazine (COMPAZINE) 10 MG tablet Take 1 tablet (10 mg total) by mouth every 6 (six) hours as needed (Nausea or vomiting). 30 tablet 1   No current facility-administered medications for this encounter.   Facility-Administered Medications Ordered in Other Encounters  Medication Dose Route Frequency Provider Last Rate Last Dose  . sodium chloride 0.9 % injection 10 mL  10 mL Intracatheter PRN Truitt Merle, MD   10 mL at 01/06/16 1730    Allergies  Allergen Reactions  . Lisinopril Cough  . Oxycodone Itching  . Percocet [Oxycodone-Acetaminophen] Itching      Social History   Social History  . Marital Status: Single    Spouse Name: N/A  . Number of Children: N/A  . Years of Education: N/A   Occupational History  . Not on file.   Social History Main Topics  . Smoking status: Never Smoker   . Smokeless tobacco: Never Used     Comment: previous secondhand smoke exposure  . Alcohol Use: 2.4 oz/week    4 Glasses of wine per week     Comment: 1 bottle of wine per wk  . Drug Use: No  . Sexual Activity: Yes    Birth Control/ Protection: Surgical   Other Topics Concern  . Not on file   Social History Narrative      Family History  Problem Relation Age of Onset  . Hypertension Mother   . Stroke Mother   . Alzheimer's disease Mother   . Heart disease Father   . Stroke Father   . Heart attack Father   . Breast cancer Sister 75    double mastectomy  . Other Sister     "stomach tumor that wrapped around reproductive organs"; dx. 52s; required partial hysterectomy  . Alzheimer's disease Maternal Grandmother   . Alzheimer's disease Paternal Grandmother     . Other Other     had to have lymph nodes removed and radiation; dx. 78s  . Cancer Maternal Aunt     unspecified type; dx. <50  . Colon cancer Maternal Uncle     dx. 79s  . Breast cancer Cousin     dx. 50s    Filed Vitals:   03/21/16 1130  BP: 92/56  Pulse: 96  Weight: 150 lb 8 oz (68.266 kg)  SpO2: 99%    PHYSICAL EXAM: General:  Well appearing. No respiratory difficulty HEENT: normal Neck: supple. no JVD. Carotids 2+ bilat; no bruits. No lymphadenopathy or thryomegaly appreciated. Cor: s/p bilateral  mastectomies. PMI nondisplaced. Regular rate & rhythm. No rubs, gallops or murmurs. Lungs: clear Abdomen: soft, nontender, nondistended. No hepatosplenomegaly. No bruits or masses. Good bowel sounds. Extremities: no cyanosis, clubbing, rash, edema Neuro: alert & oriented x 3, cranial nerves grossly intact. moves all 4 extremities w/o difficulty. Affect pleasant.   ASSESSMENT & PLAN:   1. Breast cancer - Right breast invasive ductal carcinoma, pT1cN1aM0, stage IIB, grade 2, ER+/PR+/HER2+  - Now s/p bilateral mastectomy - Has a history of stage 1 L breast CA s/p lumpectomy ( now as above, s/p B mastectomy) and was treated at that time with Adriamycin.  - now s/p 4/6 cycles of adjuvant chemo + herceptin/perjeta  2. 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 This is asx. - discussed with Dr. Burr Medico. Will hold herceptin/perjeta. Can continue docetaxel, carboplatin. Start carvedilol 3.125 bid as BP tolerates. See back 3-4 weeks to see if we can add ACE. (BP low). Repeat echo in 6-8 weeks. If EF improved can restart herceptin/perjeta with close f/u. May need several scheduled interruptions to try and complete 1 year of biological therapy.    Bensimhon, Daniel,MD 11:53 AM

## 2016-03-21 NOTE — Progress Notes (Addendum)
Advanced Heart Failure Medication Review by a Pharmacist  Does the patient  feel that his/her medications are working for him/her?  yes  Has the patient been experiencing any side effects to the medications prescribed?  SE from chemotherapy  Does the patient measure his/her own blood pressure or blood glucose at home?  no   Does the patient have any problems obtaining medications due to transportation or finances?   no  Understanding of regimen: excellent Understanding of indications: excellent Potential of compliance: excellent Patient understands to avoid NSAIDs. Patient understands to avoid decongestants.  Issues to address at subsequent visits: none   Pharmacist comments: Sandra James is a 50 yo woman presents being followed in HF clinic while on chemotherapy. Has 2 cycles left of adjuvant treatment, which she receives every 3rd Thursday of the month to be followed by 1 year of trastuzumab therapy. Next cycle tomorrow. Of note, pt had fainting episode after last chemo regimen last month d/t hypovolemia, has taste aberration from treatment and does not eat or drink much after chemo treatment.   Also noticed tingling, nail pain with color change turning black and blue that started 4 mos ago. No issues or concerns with non-chemotherapy meds.    Heloise Ochoa, [place].D., BCPS PGY2 Cardiology Pharmacy Resident Pager: [phone]   Time with patient: 5 min Preparation and documentation time: 5 min Total time: 10 min

## 2016-03-21 NOTE — Progress Notes (Signed)
  Echocardiogram 2D Echocardiogram has been performed.  Sandra James 03/21/2016, 12:11 PM

## 2016-03-21 NOTE — Patient Instructions (Signed)
Start Carvedilol 3.125 mg Twice daily   Your physician recommends that you schedule a follow-up appointment in: 4 weeks  Your physician has requested that you have an echocardiogram. Echocardiography is a painless test that uses sound waves to create images of your heart. It provides your doctor with information about the size and shape of your heart and how well your heart's chambers and valves are working. This procedure takes approximately one hour. There are no restrictions for this procedure.  In 7 weeks

## 2016-03-22 ENCOUNTER — Telehealth: Payer: Self-pay | Admitting: *Deleted

## 2016-03-22 NOTE — Telephone Encounter (Signed)
Received vm call from pt stating that she had an ECHO yest  & was told that the herceptin has damaged her heart & she is taking heart meds now & would like to speak with Dr Burr Medico.  She can be reached at [phone].  Message to Dr Burr Medico.

## 2016-03-23 NOTE — Telephone Encounter (Signed)
I called the patient back. I have previously discussed with Dr. Sung Amabile about her echo change and we decided to hold on her Herceptin and pejeta, but okay to continue her chemotherapy. I discussed the plan with patient, she voiced good understanding and agrees. She will return as it is scheduled for next cycle chemotherapy.  Sandra James  03/23/2016

## 2016-03-28 ENCOUNTER — Telehealth (HOSPITAL_COMMUNITY): Payer: Self-pay | Admitting: *Deleted

## 2016-03-28 ENCOUNTER — Telehealth: Payer: Self-pay | Admitting: *Deleted

## 2016-03-28 NOTE — Telephone Encounter (Signed)
Thanks- appreciate the quick response- we will be following up.

## 2016-03-28 NOTE — Telephone Encounter (Signed)
Call received in [place] from pt stating increase in fatigue " to the point that I cannot really get up and do much- I will not be going to work today " -  Per discussion - weakness has been an ongoing issue but symptoms have increased since starting the new cardiac medication per Dr Haroldine Laws.  Pt able to hydrate well.  Above information given to Dr Burr Medico- with request for pt to see someone at the Cardiac Failure Clinic today to evaluate known cardiac issues and new medication. If above not contributing pt can be seen here in our symptom management clinic.  This RN called to Dr Bensimhon's and spoke with Nira Conn RN- above discussed.  She states they will contact pt today for visit.

## 2016-03-28 NOTE — Telephone Encounter (Signed)
Received call earlier today from Mateo Flow, RN at [place] [facility], she states pt called them c/o extreme fatigue and they feel it could be related to her heart/new med and wanted [place] to f/u with pt.    Discussed w/Dr Bensimhon he states have pt stop Carvedilol for now and see if symptoms improve.  Spoke w/pt, she is aware and agreeable, she will not take any more of the Carvedilol and will call us early next week with update on how she is doing.

## 2016-03-28 NOTE — Telephone Encounter (Signed)
Return call from patient reporting she is on a new heart medicine and has not received a return call.  Advised of what triage has done and to expect a call from Dr. Clayborne Dana office.

## 2016-03-30 ENCOUNTER — Ambulatory Visit: Payer: BLUE CROSS/BLUE SHIELD

## 2016-03-30 ENCOUNTER — Other Ambulatory Visit (HOSPITAL_BASED_OUTPATIENT_CLINIC_OR_DEPARTMENT_OTHER): Payer: BLUE CROSS/BLUE SHIELD

## 2016-03-30 ENCOUNTER — Telehealth: Payer: Self-pay | Admitting: Hematology

## 2016-03-30 ENCOUNTER — Ambulatory Visit (HOSPITAL_BASED_OUTPATIENT_CLINIC_OR_DEPARTMENT_OTHER): Payer: BLUE CROSS/BLUE SHIELD | Admitting: Hematology

## 2016-03-30 ENCOUNTER — Ambulatory Visit (HOSPITAL_BASED_OUTPATIENT_CLINIC_OR_DEPARTMENT_OTHER): Payer: BLUE CROSS/BLUE SHIELD

## 2016-03-30 ENCOUNTER — Encounter: Payer: Self-pay | Admitting: Hematology

## 2016-03-30 ENCOUNTER — Ambulatory Visit (HOSPITAL_BASED_OUTPATIENT_CLINIC_OR_DEPARTMENT_OTHER): Payer: BLUE CROSS/BLUE SHIELD | Admitting: Nurse Practitioner

## 2016-03-30 VITALS — BP 106/68 | HR 91 | Temp 98.1°F | Resp 18 | Ht 66.0 in | Wt 153.8 lb

## 2016-03-30 DIAGNOSIS — C50411 Malignant neoplasm of upper-outer quadrant of right female breast: Secondary | ICD-10-CM

## 2016-03-30 DIAGNOSIS — Z5111 Encounter for antineoplastic chemotherapy: Secondary | ICD-10-CM | POA: Diagnosis not present

## 2016-03-30 DIAGNOSIS — E119 Type 2 diabetes mellitus without complications: Secondary | ICD-10-CM | POA: Diagnosis not present

## 2016-03-30 DIAGNOSIS — Z5189 Encounter for other specified aftercare: Secondary | ICD-10-CM | POA: Diagnosis not present

## 2016-03-30 DIAGNOSIS — Z95828 Presence of other vascular implants and grafts: Secondary | ICD-10-CM

## 2016-03-30 DIAGNOSIS — G4700 Insomnia, unspecified: Secondary | ICD-10-CM | POA: Diagnosis not present

## 2016-03-30 DIAGNOSIS — R197 Diarrhea, unspecified: Secondary | ICD-10-CM | POA: Diagnosis not present

## 2016-03-30 DIAGNOSIS — T7840XA Allergy, unspecified, initial encounter: Secondary | ICD-10-CM

## 2016-03-30 DIAGNOSIS — Z853 Personal history of malignant neoplasm of breast: Secondary | ICD-10-CM

## 2016-03-30 LAB — CBC WITH DIFFERENTIAL/PLATELET
BASO%: 0.6 % (ref 0.0–2.0)
Basophils Absolute: 0 10*3/uL (ref 0.0–0.1)
EOS%: 0.3 % (ref 0.0–7.0)
Eosinophils Absolute: 0 10*3/uL (ref 0.0–0.5)
HCT: 34.1 % — ABNORMAL LOW (ref 34.8–46.6)
HGB: 11.4 g/dL — ABNORMAL LOW (ref 11.6–15.9)
LYMPH%: 22.1 % (ref 14.0–49.7)
MCH: 30.6 pg (ref 25.1–34.0)
MCHC: 33.4 g/dL (ref 31.5–36.0)
MCV: 91.7 fL (ref 79.5–101.0)
MONO#: 0.8 10*3/uL (ref 0.1–0.9)
MONO%: 12.1 % (ref 0.0–14.0)
NEUT%: 64.9 % (ref 38.4–76.8)
NEUTROS ABS: 4.3 10*3/uL (ref 1.5–6.5)
PLATELETS: 216 10*3/uL (ref 145–400)
RBC: 3.72 10*6/uL (ref 3.70–5.45)
RDW: 17 % — ABNORMAL HIGH (ref 11.2–14.5)
WBC: 6.6 10*3/uL (ref 3.9–10.3)
lymph#: 1.5 10*3/uL (ref 0.9–3.3)

## 2016-03-30 LAB — COMPREHENSIVE METABOLIC PANEL
ALT: 33 U/L (ref 0–55)
ANION GAP: 8 meq/L (ref 3–11)
AST: 26 U/L (ref 5–34)
Albumin: 3.8 g/dL (ref 3.5–5.0)
Alkaline Phosphatase: 73 U/L (ref 40–150)
BILIRUBIN TOTAL: 0.65 mg/dL (ref 0.20–1.20)
BUN: 8.4 mg/dL (ref 7.0–26.0)
CHLORIDE: 106 meq/L (ref 98–109)
CO2: 27 meq/L (ref 22–29)
CREATININE: 0.8 mg/dL (ref 0.6–1.1)
Calcium: 9.3 mg/dL (ref 8.4–10.4)
EGFR: >90 mL/min/{1.73_m2} (ref >90)
GLUCOSE: 96 mg/dL (ref 70–140)
Potassium: 3.6 mEq/L (ref 3.5–5.1)
SODIUM: 141 meq/L (ref 136–145)
TOTAL PROTEIN: 7 g/dL (ref 6.4–8.3)

## 2016-03-30 MED ORDER — PEGFILGRASTIM 6 MG/0.6ML ~~LOC~~ PSKT
6.0000 mg | PREFILLED_SYRINGE | Freq: Once | SUBCUTANEOUS | Status: AC
  Administered 2016-03-30: 6 mg via SUBCUTANEOUS
  Filled 2016-03-30: qty 0.6

## 2016-03-30 MED ORDER — PALONOSETRON HCL INJECTION 0.25 MG/5ML
INTRAVENOUS | Status: AC
  Filled 2016-03-30: qty 5

## 2016-03-30 MED ORDER — SODIUM CHLORIDE 0.9 % IV SOLN
Freq: Once | INTRAVENOUS | Status: AC
  Administered 2016-03-30: 12:00:00 via INTRAVENOUS
  Filled 2016-03-30: qty 5

## 2016-03-30 MED ORDER — DOCETAXEL CHEMO INJECTION 160 MG/16ML
75.0000 mg/m2 | Freq: Once | INTRAVENOUS | Status: AC
  Administered 2016-03-30: 130 mg via INTRAVENOUS
  Filled 2016-03-30: qty 13

## 2016-03-30 MED ORDER — SODIUM CHLORIDE 0.9 % IJ SOLN
10.0000 mL | INTRAMUSCULAR | Status: DC | PRN
  Administered 2016-03-30: 10 mL
  Filled 2016-03-30: qty 10

## 2016-03-30 MED ORDER — SODIUM CHLORIDE 0.9 % IV SOLN
Freq: Once | INTRAVENOUS | Status: AC
  Administered 2016-03-30: 12:00:00 via INTRAVENOUS

## 2016-03-30 MED ORDER — ZOLPIDEM TARTRATE 10 MG PO TABS: 10.0000 mg | ORAL_TABLET | Freq: Every evening | ORAL | Status: DC | PRN

## 2016-03-30 MED ORDER — SODIUM CHLORIDE 0.9% FLUSH
10.0000 mL | INTRAVENOUS | Status: DC | PRN
  Administered 2016-03-30: 10 mL via INTRAVENOUS
  Filled 2016-03-30: qty 10

## 2016-03-30 MED ORDER — SODIUM CHLORIDE 0.9 % IV SOLN
711.6000 mg | Freq: Once | INTRAVENOUS | Status: AC
  Administered 2016-03-30: 710 mg via INTRAVENOUS
  Filled 2016-03-30: qty 71

## 2016-03-30 MED ORDER — PALONOSETRON HCL INJECTION 0.25 MG/5ML
0.2500 mg | Freq: Once | INTRAVENOUS | Status: AC
  Administered 2016-03-30: 0.25 mg via INTRAVENOUS

## 2016-03-30 MED ORDER — HEPARIN SOD (PORK) LOCK FLUSH 100 UNIT/ML IV SOLN
500.0000 [IU] | Freq: Once | INTRAVENOUS | Status: AC | PRN
  Administered 2016-03-30: 500 [IU]
  Filled 2016-03-30: qty 5

## 2016-03-30 NOTE — Progress Notes (Signed)
[place]  [phone] [phone]  Clinic follow Up Note   Patient Care Team: Kristie Cowman, MD as PCP - General (Family Medicine) Stark Klein, MD as Consulting Physician (General Surgery) Truitt Merle, MD as Consulting Physician (Hematology) Arloa Koh, MD as Consulting Physician (Radiation Oncology) Mauro Kaufmann, RN as Registered Nurse Rockwell Germany, RN as Registered Nurse Sylvan Cheese, NP as Nurse Practitioner (Nurse Practitioner) 03/30/2016  CHIEF COMPLAINTS:  Follow up right breast cancer  Oncology History   Breast cancer [facility])   Staging form: Breast, AJCC 7th Edition     Pathologic stage from 11/25/2015: Stage IIA (T1c, N1a, cM0) - Unsigned     Pathologic stage from 11/25/2015: Stage IIA (T1c, N1a, cM0) - Unsigned Breast cancer of upper-outer quadrant of right female breast [facility])   Staging form: Breast, AJCC 7th Edition     Clinical stage from 09/15/2015: Stage IA (T1b, N0, M0) - Signed by Truitt Merle, MD on 12/16/2015     Pathologic stage from 11/25/2015: Stage IIA (T1c, N1a, cM0) - Signed by Truitt Merle, MD on 12/16/2015         Breast cancer of upper-outer quadrant of right female breast ([place])   09/07/2015 Mammogram Diagnostic mammogram and the ultrasound of the right breast showed a 0.8 cm lobulated mass in the right breast 11 to 12:00 position 9 cm from the nipple. No other lesions or adenopathy.   09/08/2015 Initial Diagnosis Breast cancer of upper-outer quadrant of right female breast ([place])   09/08/2015 Initial Biopsy Right breast mass at the upper outer quadrant core needle biopsy showed invasive ductal carcinoma, grade 2-3, ductal carcinoma in situ.   09/08/2015 Receptors her2 ER 100% positive, PR 100% positive, Ki-67 60%, HER-2 positive with copy # 4.25 and ratio 2.58   11/25/2015 Surgery Right breast mastectomy and sentinel lymph node biopsy   11/25/2015 Pathology Results Right breast invasive ductal carcinoma, grade 2, 2.0  cm, DCIS, intermediate grade, (+) LVI, margins were negative, 1 out of 3 lymph nodes were negative. Left breast ostectomy was negative for malignancy.   01/06/2016 -  Chemotherapy adjuvant chemotherapy TCH P, every 3 weeks, plan for 6 weeks     HISTORY OF PRESENTING ILLNESS:  Sandra James 50 y.o. female with past medical history of stage I left breast cancer, is here because of newly diagnosed right breast cancer. She presents to our multidisciplinary breast clinic by herself.  The right breast cancer was discovered by screening mammogram. She did not have any palpable mass, or any constitutional symptoms. She was diagnosed with stage I left breast cancer at age of 50, she had lumpectomy, radiation, and adjuvant chemotherapy and 5 years of tamoxifen. She was treated by Dr. Louann Sjogren in [place]. She moved to [facility] about year ago due to job change. She has been very compliant with annual screening mammogram.  She had hysterectomy and bilateral oophorectomy and sphincterectomy 5 years ago for heavy bleeding.. She has been having hot flashes since then, moderate, but manageable. She is single, lives alone, works for 2 to [place] has to go up children who live in [place].  CURRENT THERAPY: Adjuvant chemotherapy with docetaxel, carboplatin, Herceptin and pejeta (TCHP) every 3 weeks started on 01/06/2016  [place] returns for follow-up and 5th cycle chemo. She was seen by  Cardiologist Dr. Haroldine Laws 2 weeks ago,  And her repeated echo showed a slightly decreased of EF. She was started on Coreg,  But she  Had a profound fatigue, and stopped a few days ago. She has recovered well this week.  She had a moderate diarrhea after chemotherapy, she was able to drink adequately, and it resolved afterwards. No other new complaints.  MED ICAL HISTORY:  Past Medical History  Diagnosis Date  . MVP (mitral valve prolapse)   . Infection     UTI  . Anxiety   . Fibroid   . Cancer  [facility])     left brast lumpectomy   . Breast cancer of upper-outer quadrant of right female breast ([place]) 09/09/2015  . Diabetes mellitus without complication ([place])   . Depression   . GERD (gastroesophageal reflux disease)   . Complication of anesthesia     slow to wake up- BP was low, fainted next day-  . Breast cancer ([place]) 09/07/15    right breast  . Allergy   . UTI (lower urinary tract infection)   . Neuromuscular disorder ([place])     sciatic nerve paion left side/leg    SURGICAL HISTORY: Past Surgical History  Procedure Laterality Date  . Abdominal hysterectomy    . Breast surgery  2004    left lumpectomy  . Oophorectomy Bilateral   . Mastectomy w/ sentinel node biopsy Bilateral 11/25/2015    Procedure: BILATERAL SKIN SPARING MASTECTOMIES WITH RIGHT SENTINEL LYMPH NODE BIOPSY;  Surgeon: Stark Klein, MD;  Location: [facility];  Service: General;  Laterality: Bilateral;  . Breast reconstruction with placement of tissue expander and flex hd (acellular hydrated dermis) Bilateral 11/25/2015    Procedure: BILATERAL IMMEDIATE BREAST RECONSTRUCTION WITH PLACEMENT OF TISSUE EXPANDERS AND FLEX HD (ACELLULAR HYDRATED DERMIS);  Surgeon: Wallace Going, DO;  Location: [facility];  Service: Plastics;  Laterality: Bilateral;  . Portacath placement Left 12/20/2015    Procedure: INSERTION PORT-A-CATH, left chest;  Surgeon: Stark Klein, MD;  Location: [facility];  Service: General;  Laterality: Left;    SOCIAL HISTORY: Social History   Social History  . Marital Status: Single     Spouse Name: N/A  . Number of Children: 2 children, 74 daughter and 25 yo son    . Years of Education: N/A   Occupational History  . She is a Barista rep   Social History Main Topics  . Smoking status: Never Smoker   . Smokeless tobacco: Never Used  . Alcohol Use: Yes     Comment: 2-3/wk  . Drug Use: No  . Sexual Activity: Yes    Birth Control/ Protection: Surgical   Other  Topics Concern  . Not on file   Social History Narrative    FAMILY HISTORY: Family History  Problem Relation Age of Onset  . Hypertension Mother   . Stroke Mother   . Alzheimer's disease Mother   . Heart disease Father   . Stroke Father   . Heart attack Father   . Breast cancer Sister 71    double mastectomy  . Other Sister     "stomach tumor that wrapped around reproductive organs"; dx. 72s; required partial hysterectomy  . Alzheimer's disease Maternal Grandmother   . Alzheimer's disease Paternal Grandmother   . Other Other     had to have lymph nodes removed and radiation; dx. 53s  . Cancer Maternal Aunt     unspecified type; dx. <50  . Colon cancer Maternal Uncle     dx. 56s  . Breast cancer Cousin     dx. 28s    ALLERGIES:  is allergic  to lisinopril; oxycodone; and percocet.  MEDICATIONS:  Current Outpatient Prescriptions  Medication Sig Dispense Refill  . dexamethasone (DECADRON) 4 MG tablet Take 2 tablets (8 mg total) by mouth 2 (two) times daily. Start the day before Taxotere. Then again the day after chemo for 3 days. 30 tablet 1  . fluticasone (FLONASE) 50 MCG/ACT nasal spray Place 1 spray into both nostrils daily as needed for allergies.   0  . ibuprofen (ADVIL,MOTRIN) 800 MG tablet Take 800 mg by mouth daily as needed for headache or moderate pain.    Marland Kitchen lidocaine-prilocaine (EMLA) cream Apply to affected area once 30 g 3  . magic mouthwash SOLN Take 5 mLs by mouth 4 (four) times daily as needed for mouth pain. 320 mL 1  . ondansetron (ZOFRAN) 8 MG tablet Take 1 tablet (8 mg total) by mouth 2 (two) times daily. Start the day after chemo for 3 days. Then take as needed for nausea or vomiting. 30 tablet 1  . polyethylene glycol (MIRALAX / GLYCOLAX) packet Take 17 g by mouth daily as needed for mild constipation. 14 each 0  . potassium chloride SA (K-DUR,KLOR-CON) 20 MEQ tablet Take 1 tablet (20 mEq total) by mouth daily. 30 tablet 1  . prochlorperazine (COMPAZINE)  10 MG tablet Take 1 tablet (10 mg total) by mouth every 6 (six) hours as needed (Nausea or vomiting). 30 tablet 1  . carvedilol (COREG) 3.125 MG tablet Take 1 tablet (3.125 mg total) by mouth 2 (two) times daily. (Patient not taking: Reported on 03/30/2016) 180 tablet 3  . diazepam (VALIUM) 2 MG tablet   0  . mirtazapine (REMERON) 30 MG tablet Reported on 03/30/2016  3  . zolpidem (AMBIEN) 10 MG tablet Take 1 tablet (10 mg total) by mouth at bedtime as needed for sleep. 30 tablet 0   No current facility-administered medications for this visit.   Facility-Administered Medications Ordered in Other Visits  Medication Dose Route Frequency Provider Last Rate Last Dose  . CARBOplatin (PARAPLATIN) 710 mg in sodium chloride 0.9 % 250 mL chemo infusion  710 mg Intravenous Once Truitt Merle, MD      . DOCEtaxel (TAXOTERE) 130 mg in sodium chloride 0.9 % 250 mL chemo infusion  75 mg/m2 (Treatment Plan Actual) Intravenous Once Truitt Merle, MD      . fosaprepitant (EMEND) 150 mg, dexamethasone (DECADRON) 12 mg in sodium chloride 0.9 % 145 mL IVPB   Intravenous Once Truitt Merle, MD 454 mL/hr at 03/30/16 1214    . heparin lock flush 100 unit/mL  500 Units Intracatheter Once PRN Truitt Merle, MD      . pegfilgrastim (NEULASTA ONPRO KIT) injection 6 mg  6 mg Subcutaneous Once Truitt Merle, MD      . sodium chloride 0.9 % injection 10 mL  10 mL Intracatheter PRN Truitt Merle, MD   10 mL at 01/06/16 1730  . sodium chloride 0.9 % injection 10 mL  10 mL Intracatheter PRN Truitt Merle, MD        REVIEW OF SYSTEMS:   Constitutional: Denies fevers, chills or abnormal night sweats Eyes: Denies blurriness of vision, double vision or watery eyes Ears, nose, mouth, throat, and face: Denies mucositis or sore throat Respiratory: Denies cough, dyspnea or wheezes Cardiovascular: Denies palpitation, chest discomfort or lower extremity swelling Gastrointestinal:  Denies nausea, heartburn or change in bowel habits Skin: Denies abnormal skin  rashes Lymphatics: Denies new lymphadenopathy or easy bruising Neurological:Denies numbness, tingling or new weaknesses Behavioral/Psych: Mood is  stable, no new changes  All other systems were reviewed with the patient and are negative.  PHYSICAL EXAMINATION: ECOG PERFORMANCE STATUS: 1  Filed Vitals:   03/30/16 1120  BP: 106/68  Pulse: 91  Temp: 98.1 F (36.7 C)  Resp: 18   Filed Weights   03/30/16 1120  Weight: 153 lb 12.8 oz (69.763 kg)    GENERAL:alert, no distress and comfortable SKIN: skin color, texture, turgor are normal, no rashes or significant lesions EYES: normal, conjunctiva are pink and non-injected, sclera clear OROPHARYNX:no exudate, no erythema and lips, buccal mucosa, and tongue normal  NECK: supple, thyroid normal size, non-tender, without nodularity LYMPH:  no palpable lymphadenopathy in the cervical, axillary or inguinal LUNGS: clear to auscultation and percussion with normal breathing effort HEART: regular rate & rhythm and no murmurs and no lower extremity edema ABDOMEN:abdomen soft, non-tender and normal bowel sounds Musculoskeletal:no cyanosis of digits and no clubbing  PSYCH: alert & oriented x 3 with fluent speech NEURO: no focal motor/sensory deficits Breasts: s/p bilateral mastectomy and tissue spender placement. Surgical incision sites are clean, no surrounding skin erythema or discharge.    LABORATORY DATA:  I have reviewed the data as listed CBC Latest Ref Rng 03/30/2016 03/12/2016 03/09/2016  WBC 3.9 - 10.3 10e3/uL 6.6 30.5(H) 6.6  Hemoglobin 11.6 - 15.9 g/dL 11.4(L) 11.3(L) 11.7  Hematocrit 34.8 - 46.6 % 34.1(L) 33.1(L) 34.8  Platelets 145 - 400 10e3/uL 216 209 278    CMP Latest Ref Rng 03/30/2016 03/12/2016 03/09/2016  Glucose 70 - 140 mg/dl 96 94 92  BUN 7.0 - 26.0 mg/dL 8.4 11 6.0(L)  Creatinine 0.6 - 1.1 mg/dL 0.8 0.62 0.8  Sodium 136 - 145 mEq/L 141 140 143  Potassium 3.5 - 5.1 mEq/L 3.6 3.2(L) 3.4(L)  Chloride 101 - 111 mmol/L - 103 -   CO2 22 - 29 mEq/L 27 27 27   Calcium 8.4 - 10.4 mg/dL 9.3 9.2 9.1  Total Protein 6.4 - 8.3 g/dL 7.0 7.5 7.3  Total Bilirubin 0.20 - 1.20 mg/dL 0.65 0.9 0.46  Alkaline Phos 40 - 150 U/L 73 85 83  AST 5 - 34 U/L 26 51(H) 37(H)  ALT 0 - 55 U/L 33 78(H) 59(H)     Pathology report Diagnosis 11/25/2015 1. Breast, simple mastectomy, right - INVASIVE DUCTAL CARCINOMA, GRADE 2/3, SPANNING 2.0 CM. - DUCTAL CARCINOMA IN SITU, INTERMEDIATE GRADE. - LYMPHOVASCULAR INVASION IS IDENTIFIED. - LOBULAR NEOPLASIA (ATYPICAL LOBULAR HYPERPLASIA). - THE SURGICAL RESECTION MARGINS ARE NEGATIVE FOR CARCINOMA. - SEE ONCOLOGY TABLE BELOW. 2. Lymph node, sentinel, biopsy, right axillary #1 - METASTATIC CARCINOMA IN 1 OF 1 LYMPH NODE (1/1). 3. Lymph node, sentinel, biopsy, right axillary #2 - THERE IS NO EVIDENCE OF CARCINOMA IN 1 OF 1 LYMPH NODE (0/1). 4. Lymph node, sentinel, biopsy, right axillary #3 - THERE IS NO EVIDENCE OF CARCINOMA IN 1 OF 1 LYMPH NODE (0/1). 5. Breast, simple mastectomy, left - BENIGN BREAST PARENCHYMA WITH DENSE STROMAL FIBROSIS. - FIBROADENOMA. - THERE IS NO EVIDENCE OF MALIGNANCY. 6. Breast, excision, left, additional lateral margin - BENIGN FIBROADIPOSE TISSUE. - THERE IS NO EVIDENCE OF MALIGNANCY. - SEE COMMENT. Microscopic Comment 1. BREAST, INVASIVE TUMOR, WITH LYMPH NODES PRESENT Specimen, including laterality and lymph node sampling (sentinel, non-sentinel): Right breast and right axillary lymph nodes Procedure: Bilateral mastectomy and multiple right axillary lymph node resections Histologic type: Ductal Grade: 2 Tubule formation: 2 1 of 4 FINAL for Concepcion, Hadleigh ([id number]) Microscopic Comment(continued) Nuclear pleomorphism: 2 Mitotic: 2 Tumor size (gross  measurement): 2.0 cm Margins: Negative for carcinoma Invasive, distance to closest margin: 1.8 cm to the posterior margin In-situ, distance to closest margin: 1.8 cm to the posterior  margin Lymphovascular invasion: Present Ductal carcinoma in situ: Present Grade: Intermediate grade Extensive intraductal component: Not identified Lobular neoplasia: Present, atypical lobular hyperplasia Tumor focality: Unifocal Treatment effect: Not identified Extent of tumor: Confined to breast parenchyma Lymph nodes: Examined: 3 Sentinel 0 Non-sentinel 3 Total Lymph nodes with metastasis: 1 (macrometastasis) - no extracapsular extension is identified. Breast prognostic profile: [phone] Estrogen receptor: 100%, strong staining intensity Progesterone receptor: 100%, strong staining intensity Her 2 neu: Amplification was detected. The ratio was 2.58 Ki-67: 60% TNM: pT1c, pN1a Non-neoplastic breast: No significant findings. 6. The surgical resection margin(s) of the specimen were inked and microscopically evaluated. Enid Cutter MD Pathologist, Electronic Signature (Case signed 11/29/2015)   RADIOGRAPHIC STUDIES: I have personally reviewed the radiological images as listed and agreed with the findings in the report.  Bone scan 12/28/2014 IMPRESSION: Foci of increased tracer localization at the sternum, unable to exclude metastases.  Uptake at inferior cervical spine could potentially be related to degenerative disc disease changes present at C5-C6 on CT.  Questionable abnormal increased tracer localization at the posterior LEFT ninth and tenth ribs.  CT chest, abdomen and pelvis 12/28/2014 IMPRESSION: 1. 2 cm enhancing lesion in segment 5 of the liver is indeterminate. It could reflect FNH, vascular shunt and less likely metastasis. Recommend MRI abdomen without and with contrast using Eovist for further evaluation. No other hepatic lesions. 2. No findings for metastatic disease involving the chest. There is a focal area of airspace disease in the superior segment of the right lower lobe which could be a developing or resolving infiltrate. Recommend correlation  with clinical symptoms.  ECHO 03/21/2016 Study Conclusions  - Left ventricle: The cavity size was normal. Wall thickness was  normal. Systolic function was normal. The estimated ejection  fraction was in the range of 50% to 55%. Wall motion was normal;  there were no regional wall motion abnormalities. Doppler  parameters are consistent with abnormal left ventricular  relaxation (grade 1 diastolic dysfunction). - Impressions: Lateral s&' 9.2 cm/sec. GLS -16.1%  Impressions:  - Lateral s&' 9.2 cm/sec. GLS -16.1%   ASSESSMENT & PLAN:  50 year old African-American female, surgical postmenopausal, history of stage I left breast cancer, with newly diagnosed right stage I breast cancer.  1. Right breast invasive ductal carcinoma, pT1cN1aM0, stage IIB, grade 2, ER100%+/PR100%+/HER2+ -I previously reviewed her surgical pathology results with patient in great details. She has 1 out of 3 sentinel lymph nodes positive, her pathological stage is more advanced than her initial clinical stage.  -We reviewed the natural history of triple positive breast cancer. HER-2 positive tumors are more progressive, with higher risk of recurrence -I reviewed her restaging CT scan and bone scan images with her in person. I discussed the bone scan findings with radiologist Dr. Thornton Papas. The mild hypermetabolic uptake in the sternum and left ninth and 10th rib have no significant corresponding change on the CT scan, except for small lytic spot in the sternum. They're not amenable for biopsy, PET scan may not have much additional value, the suspicion for metastases from breast cancer is low. I recommend follow-up. The 2 cm lesion in the liver are likely benign, unfortunately she is not able to do liver MRI, due to her breast implants.  -She is still concerned about the above scan findings. I recommend to have a repeated CT or  PET scan a few months after she completes adjuvant chemotherapy for follow-up. -Given her  high risk of cancer recurrence, I recommend adjuvant chemotherapy with docetaxel, carboplatin, Herceptin and Perjeta (TCHP), every 3 weeks for 6 cycles,  followed by maintenance Herceptin to complete a 1 year therapy. She previously received Adriamycin, will not be able to tolerate more adrimycin. -She is tolerating chemotherapy moderately well, with the main side effects of diarrhea, which is manageable. Lab results reviewed with her, adequate for treatment, we'll continue.  - Due to her slightly decreased of EF on the repeat echo, Dr. Haroldine Laws has recommended to hold  Herceptin and pejeta  Temporary, and repeat echo in 6-8 weeks. She tried Coreg but could not tolerate due to the fatigue  - I'll continue her cycle 5 and cycle 6 chemotherapy with  Carboplatin and  docetaxel  2. Diarrhea  -Secondary to chemotherapy. -I recommend her to use Imodium as needed but she is afraid of using it due to pneumonia related constipation. She drinks fluids adequately to avoid dehydration. -I'll schedule her to return in 5 days for IV fluids.  3. Genetics -Due to her young age and recurrent breast cancer, she was referred to see a genetic counselor in our cancer center. -Her genetic test was negative  4. DM -she will continue follow-up with her primary care physician -She is on metformin -We again reviewed steroids induced hyperglycemia, she'll monitor her sugar closely at home, especially after chemotherapy.  4. Bone health -She is postmenopausal by surgery -I encouraged her to take calcium and vitamin D -I'll obtain a baseline bone density scan   5. Insomnia and low appetite  -continue mirtazapine,  And Ambien - I given her a new prescription of Ambien 10 mg today  Plan for today  -Lab reviewed, adequate for treatment, cycle 5 today  With carboplatin and docetaxel, hold  Herceptin and pejeta -IVF in 5 days  -RTC in 3 weeks with lab, f/p and chemo  -Ambien was refilled today   All questions were  answered. The patient knows to call the clinic with any problems, questions or concerns.  I spent 25 minutes counseling the patient face to face. The total time spent in the appointment was 30 minutes and more than 50% was on counseling.     Truitt Merle, MD 03/30/2016

## 2016-03-30 NOTE — Progress Notes (Signed)
No herceptin/perjeta today per Dr. Burr Medico

## 2016-03-30 NOTE — Telephone Encounter (Signed)
Adjust appt to later time per pt request...the patient ok and aware of new d.t

## 2016-03-30 NOTE — Progress Notes (Signed)
@   1400 pt c/o of tingling and burning sensation in right arm and right leg.  She has not experienced this before. No other symptoms.  Pt receiving taxotere currently. Informed Selena Lesser, NP about this and she was in to see pt.  Cyndeediscussed with Dr. Burr Medico. No changes to plan other than to flush with additional 100cc saline after Taxotere completed.  1430  Taxotere completed and flushed with additional saline. Symptoms has resolved.  No other c/o rest of treatment time.

## 2016-03-30 NOTE — Patient Instructions (Signed)
[facility] Discharge Instructions for Patients Receiving Chemotherapy  Today you received the following chemotherapy agents taxotere/carboplatin   To help prevent nausea and vomiting after your treatment, we encourage you to take your nausea medication as directed.  If you develop nausea and vomiting that is not controlled by your nausea medication, call the clinic.   BELOW ARE SYMPTOMS THAT SHOULD BE REPORTED IMMEDIATELY:  *FEVER GREATER THAN 100.5 F  *CHILLS WITH OR WITHOUT FEVER  NAUSEA AND VOMITING THAT IS NOT CONTROLLED WITH YOUR NAUSEA MEDICATION  *UNUSUAL SHORTNESS OF BREATH  *UNUSUAL BRUISING OR BLEEDING  TENDERNESS IN MOUTH AND THROAT WITH OR WITHOUT PRESENCE OF ULCERS  *URINARY PROBLEMS  *BOWEL PROBLEMS  UNUSUAL RASH Items with * indicate a potential emergency and should be followed up as soon as possible.  Feel free to call the clinic you have any questions or concerns. The clinic phone number is [phone].  

## 2016-03-30 NOTE — Patient Instructions (Signed)
[id number] [facility] Guide An implanted port is a type of central line that is placed under the skin. Central lines are used to provide IV access when treatment or nutrition needs to be given through a person's veins. Implanted ports are used for long-term IV access. An implanted port may be placed because:   You need IV medicine that would be irritating to the small veins in your hands or arms.   You need long-term IV medicines, such as antibiotics.   You need IV nutrition for a long period.   You need frequent blood draws for lab tests.   You need dialysis.  Implanted ports are usually placed in the chest area, but they can also be placed in the upper arm, the abdomen, or the leg. An implanted port has two main parts:   Reservoir. The reservoir is round and will appear as a small, raised area under your skin. The reservoir is the part where a needle is inserted to give medicines or draw blood.   Catheter. The catheter is a thin, flexible tube that extends from the reservoir. The catheter is placed into a large vein. Medicine that is inserted into the reservoir goes into the catheter and then into the vein.  HOW WILL I CARE FOR MY INCISION SITE? Do not get the incision site wet. Bathe or shower as directed by your health care provider.  HOW IS MY PORT ACCESSED? Special steps must be taken to access the port:   Before the port is accessed, a numbing cream can be placed on the skin. This helps numb the skin over the port site.   Your health care provider uses a sterile technique to access the port.  Your health care provider must put on a mask and sterile gloves.  The skin over your port is cleaned carefully with an antiseptic and allowed to dry.  The port is gently pinched between sterile gloves, and a needle is inserted into the port.  Only "non-coring" port needles should be used to access the port. Once the port is accessed, a blood return  should be checked. This helps ensure that the port is in the vein and is not clogged.   If your port needs to remain accessed for a constant infusion, a clear (transparent) bandage will be placed over the needle site. The bandage and needle will need to be changed every week, or as directed by your health care provider.   Keep the bandage covering the needle clean and dry. Do not get it wet. Follow your health care provider's instructions on how to take a shower or bath while the port is accessed.   If your port does not need to stay accessed, no bandage is needed over the port.  WHAT IS FLUSHING? Flushing helps keep the port from getting clogged. Follow your health care provider's instructions on how and when to flush the port. Ports are usually flushed with saline solution or a medicine called heparin. The need for flushing will depend on how the port is used.   If the port is used for intermittent medicines or blood draws, the port will need to be flushed:   After medicines have been given.   After blood has been drawn.   As part of routine maintenance.   If a constant infusion is running, the port may not need to be flushed.  HOW LONG WILL MY PORT STAY IMPLANTED? The port can stay in for as long as your health care  provider thinks it is needed. When it is time for the port to come out, surgery will be done to remove it. The procedure is similar to the one performed when the port was put in.  WHEN SHOULD I SEEK IMMEDIATE MEDICAL CARE? When you have an implanted port, you should seek immediate medical care if:   You notice a bad smell coming from the incision site.   You have swelling, redness, or drainage at the incision site.   You have more swelling or pain at the port site or the surrounding area.   You have a fever that is not controlled with medicine.   This information is not intended to replace advice given to you by your health care provider. Make sure you  discuss any questions you have with your health care provider.   Document Released: 11/27/2005 Document Revised: 09/17/2013 Document Reviewed: 08/04/2013 Elsevier Interactive Patient Education [employer].

## 2016-04-03 ENCOUNTER — Encounter: Payer: Self-pay | Admitting: Nurse Practitioner

## 2016-04-03 DIAGNOSIS — T7840XA Allergy, unspecified, initial encounter: Secondary | ICD-10-CM | POA: Insufficient documentation

## 2016-04-03 NOTE — Assessment & Plan Note (Signed)
Patient presented to the [facility] today to receive cycle 5 of her Taxotere/carboplatin chemotherapy regimen.  The Herceptin and Perjeta are currently on hold secondary to a slight decrease in patient's ejection fraction.  Patient has plans to follow-up with her cardiologist on 04/28/2016.  She will undergo a repeat echo on 05/09/2016.  She is scheduled to return for labs, flush, visit, and her next cycle of chemotherapy on Apr 20 2016.

## 2016-04-03 NOTE — Progress Notes (Signed)
SYMPTOM MANAGEMENT CLINIC    Chief Complaint: Hypersensitivity reaction  HPI:  Sandra James 50 y.o. female diagnosed with breast cancer.  Currently undergoing Taxotere/carboplatinum/Herceptin/Perjeta chemotherapy regimen.  The Herceptin/Perjeta portion of patient's chemotherapy has been held recently due to slight decrease in ejection fraction.   Patient was in the midst of receiving the Taxotere portion of her Taxotere/carboplatin chemotherapy regimen; when she developed some mild numbness/tingling to her right arm and right leg.  She denied any other neurological changes whatsoever.  She denied any pain.  Taxotere infusion was held; and vital signs were taken.  Reviewed all findings with Dr. Burr Medico; and decision was made to monitor patient for approximately 15-20 minutes.  All symptoms completely resolved.  IV line was flushed well; and the patient was able to complete all of her chemotherapy without any further issues.  Oncology History   Breast cancer [facility])   Staging form: Breast, AJCC 7th Edition     Pathologic stage from 11/25/2015: Stage IIA (T1c, N1a, cM0) - Unsigned     Pathologic stage from 11/25/2015: Stage IIA (T1c, N1a, cM0) - Unsigned Breast cancer of upper-outer quadrant of right female breast [facility])   Staging form: Breast, AJCC 7th Edition     Clinical stage from 09/15/2015: Stage IA (T1b, N0, M0) - Signed by Truitt Merle, MD on 12/16/2015     Pathologic stage from 11/25/2015: Stage IIA (T1c, N1a, cM0) - Signed by Truitt Merle, MD on 12/16/2015         Breast cancer of upper-outer quadrant of right female breast ([place])   09/07/2015 Mammogram Diagnostic mammogram and the ultrasound of the right breast showed a 0.8 cm lobulated mass in the right breast 11 to 12:00 position 9 cm from the nipple. No other lesions or adenopathy.   09/08/2015 Initial Diagnosis Breast cancer of upper-outer quadrant of right female breast ([place])   09/08/2015 Initial Biopsy Right breast mass at the upper  outer quadrant core needle biopsy showed invasive ductal carcinoma, grade 2-3, ductal carcinoma in situ.   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 Left breast ostectomy was negative for malignancy.   01/06/2016 -  Chemotherapy adjuvant chemotherapy [facility] P, every 3 weeks, plan for 6 weeks     ROS  Past Medical History  Diagnosis Date  . MVP (mitral valve prolapse)   . Infection     UTI  . Anxiety   . Fibroid   . Cancer [facility])     left brast lumpectomy   . Breast cancer of upper-outer quadrant of right female breast ([place]) 09/09/2015  . Diabetes mellitus without complication ([place])   . Depression   . GERD (gastroesophageal reflux disease)   . Complication of anesthesia     slow to wake up- BP was low, fainted next day-  . Breast cancer ([place]) 09/07/15    right breast  . Allergy   . UTI (lower urinary tract infection)   . Neuromuscular disorder ([place])     sciatic nerve paion left side/leg    Past Surgical History  Procedure Laterality Date  . Abdominal hysterectomy    . Breast surgery  2004    left lumpectomy  . Oophorectomy  Bilateral   . Mastectomy w/ sentinel node biopsy Bilateral 11/25/2015    Procedure: BILATERAL SKIN SPARING MASTECTOMIES WITH RIGHT SENTINEL LYMPH NODE BIOPSY;  Surgeon: Stark Klein, MD;  Location: [place];  Service: General;  Laterality: Bilateral;  . Breast reconstruction with placement of tissue expander and flex hd (acellular hydrated dermis) Bilateral 11/25/2015    Procedure: BILATERAL IMMEDIATE BREAST RECONSTRUCTION WITH PLACEMENT OF TISSUE EXPANDERS AND FLEX HD (ACELLULAR HYDRATED DERMIS);  Surgeon: Wallace Going, DO;  Location: [place];  Service: Plastics;  Laterality: Bilateral;  . 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    is allergic to lisinopril; oxycodone; and percocet.    Medication List       This list is accurate as of: 03/30/16 11:59 PM.  Always use your most recent med list.               carvedilol 3.125 MG tablet  Commonly known as:  COREG  Take 1 tablet (3.125 mg total) by mouth 2 (two) times daily.     dexamethasone 4 MG tablet  Commonly known as:  DECADRON  Take 2 tablets (8 mg total) by mouth 2 (two) times daily. Start the day before Taxotere. Then again the day after chemo for 3 days.     diazepam 2 MG tablet  Commonly known as:  VALIUM     fluticasone 50 MCG/ACT nasal spray  Commonly known as:  FLONASE  Place 1 spray into both nostrils daily as needed for allergies.     ibuprofen 800 MG tablet  Commonly known as:  ADVIL,MOTRIN  Take 800 mg by mouth daily as needed for headache or moderate pain.     lidocaine-prilocaine cream  Commonly known as:  EMLA  Apply to affected area once     magic mouthwash Soln  Take 5 mLs by mouth 4 (four) times daily as needed for mouth pain.     mirtazapine 30 MG tablet  Commonly known as:  REMERON  Reported on 03/30/2016     ondansetron 8 MG tablet  Commonly known as:  ZOFRAN  Take 1 tablet (8 mg total) by mouth 2 (two) times daily. Start the day after chemo for 3 days. Then take as  needed for nausea or vomiting.     polyethylene glycol packet  Commonly known as:  MIRALAX / GLYCOLAX  Take 17 g by mouth daily as needed for mild constipation.     potassium chloride SA 20 MEQ tablet  Commonly known as:  K-DUR,KLOR-CON  Take 1 tablet (20 mEq total) by mouth daily.     prochlorperazine 10 MG tablet  Commonly known as:  COMPAZINE  Take 1 tablet (10 mg total) by mouth every 6 (six) hours as needed (Nausea or vomiting).     zolpidem 10 MG tablet  Commonly known as:  AMBIEN  Take 1 tablet (10 mg total) by mouth at bedtime as needed for sleep.         PHYSICAL EXAMINATION  Oncology Vitals 03/30/2016 03/21/2016  Height  168 cm -  Weight 69.763 kg 68.266 kg  Weight (lbs) 153 lbs 13 oz 150 lbs 8 oz  BMI (kg/m2) 24.82 kg/m2 -  Temp 98.1 -  Pulse 91 96  Resp 18 -  SpO2 100 99  BSA (m2) 1.8 m2 -   BP Readings from Last 2 Encounters:  03/30/16 106/68  03/21/16 92/56    Physical Exam  Constitutional: She is oriented to person, place, and time and well-developed, well-nourished, and in no distress.  HENT:  Head: Normocephalic and atraumatic.  Eyes: Conjunctivae and EOM are normal. Pupils are equal, round, and reactive to light. Right eye exhibits no discharge. Left eye exhibits no discharge. No scleral icterus.  Neck: Normal range of motion.  Pulmonary/Chest: Effort normal. No stridor. No respiratory distress.  Musculoskeletal: Normal range of motion. She exhibits no edema or tenderness.  Neurological: She is alert and oriented to person, place, and time.  Skin: Skin is warm and dry. No rash noted. No erythema.  Psychiatric: Affect normal.  Nursing note and vitals reviewed.   LABORATORY DATA:. Appointment on 03/30/2016  Component Date Value Ref Range Status  . WBC 03/30/2016 6.6  3.9 - 10.3 10e3/uL Final  . NEUT# 03/30/2016 4.3  1.5 - 6.5 10e3/uL Final  . HGB 03/30/2016 11.4* 11.6 - 15.9 g/dL Final  . HCT 03/30/2016 34.1* 34.8 - 46.6 % Final  . Platelets  03/30/2016 216  145 - 400 10e3/uL Final  . MCV 03/30/2016 91.7  79.5 - 101.0 fL Final  . MCH 03/30/2016 30.6  25.1 - 34.0 pg Final  . MCHC 03/30/2016 33.4  31.5 - 36.0 g/dL Final  . RBC 03/30/2016 3.72  3.70 - 5.45 10e6/uL Final  . RDW 03/30/2016 17.0* 11.2 - 14.5 % Final  . lymph# 03/30/2016 1.5  0.9 - 3.3 10e3/uL Final  . MONO# 03/30/2016 0.8  0.1 - 0.9 10e3/uL Final  . Eosinophils Absolute 03/30/2016 0.0  0.0 - 0.5 10e3/uL Final  . Basophils Absolute 03/30/2016 0.0  0.0 - 0.1 10e3/uL Final  . NEUT% 03/30/2016 64.9  38.4 - 76.8 % Final  . LYMPH% 03/30/2016 22.1  14.0 - 49.7 % Final  . MONO% 03/30/2016 12.1  0.0 - 14.0 % Final  . EOS% 03/30/2016 0.3  0.0 - 7.0 % Final  . BASO% 03/30/2016 0.6  0.0 - 2.0 % Final  . Sodium 03/30/2016 141  136 - 145 mEq/L Final  . Potassium 03/30/2016 3.6  3.5 - 5.1 mEq/L Final  . Chloride 03/30/2016 106  98 - 109 mEq/L Final  . CO2 03/30/2016 27  22 - 29 mEq/L Final  . Glucose 03/30/2016 96  70 - 140 mg/dl Final   Glucose reference range is for nonfasting patients. Fasting glucose reference range is 70- 100.  Marland Kitchen BUN 03/30/2016 8.4  7.0 - 26.0 mg/dL Final  . Creatinine 03/30/2016 0.8  0.6 - 1.1 mg/dL Final  . Total Bilirubin 03/30/2016 0.65  0.20 - 1.20 mg/dL Final  . Alkaline Phosphatase 03/30/2016 73  40 - 150 U/L Final  . AST 03/30/2016 26  5 - 34 U/L Final  . ALT 03/30/2016 33  0 - 55 U/L Final  . Total Protein 03/30/2016 7.0  6.4 - 8.3 g/dL Final  . Albumin 03/30/2016 3.8  3.5 - 5.0 g/dL Final  . Calcium 03/30/2016 9.3  8.4 - 10.4 mg/dL Final  . Anion Gap 03/30/2016 8  3 - 11 mEq/L Final  . EGFR 03/30/2016 >90  >90 ml/min/1.73 m2 Final  eGFR is calculated using the CKD-EPI Creatinine Equation (2009)    RADIOGRAPHIC STUDIES: No results found.  ASSESSMENT/PLAN:    Breast cancer of upper-outer quadrant of right female breast [facility]) Patient presented to the [facility] today to receive cycle 5 of her Taxotere/carboplatin chemotherapy regimen.   The Herceptin and Perjeta are currently on hold secondary to a slight decrease in patient's ejection fraction.  Patient has plans to follow-up with her cardiologist on 04/28/2016.  She will undergo a repeat echo on 05/09/2016.  She is scheduled to return for labs, flush, visit, and her next cycle of chemotherapy on Apr 20 2016.   Hypersensitivity reaction Patient was in the midst of receiving the Taxotere portion of her Taxotere/carboplatin chemotherapy regimen; when she developed some mild numbness/tingling to her right arm and right leg.  She denied any other neurological changes whatsoever.  She denied any pain.  Taxotere infusion was held; and vital signs were taken.  Reviewed all findings with Dr. Burr Medico; and decision was made to monitor patient for approximately 15-20 minutes.  All symptoms completely resolved.  IV line was flushed well; and the patient was able to complete all of her chemotherapy without any further issues.     Patient stated understanding of all instructions; and was in agreement with this plan of care. The patient knows to call the clinic with any problems, questions or concerns.   Total time spent with patient was 25 minutes;  with greater than 75 percent of that time spent in face to face counseling regarding patient's symptoms,  and coordination of care and follow up.  Disclaimer:This dictation was prepared with Dragon/digital dictation along with [employer]. Any transcriptional errors that result from this process are unintentional.  Drue Second, NP 04/03/2016

## 2016-04-03 NOTE — Assessment & Plan Note (Signed)
Patient was in the midst of receiving the Taxotere portion of her Taxotere/carboplatin chemotherapy regimen; when she developed some mild numbness/tingling to her right arm and right leg.  She denied any other neurological changes whatsoever.  She denied any pain.  Taxotere infusion was held; and vital signs were taken.  Reviewed all findings with Dr. Burr Medico; and decision was made to monitor patient for approximately 15-20 minutes.  All symptoms completely resolved.  IV line was flushed well; and the patient was able to complete all of her chemotherapy without any further issues.

## 2016-04-05 MED FILL — ZOLPIDEM TARTRATE 10 MG TAB: 10 | 30 days supply | Qty: 30 | Fill #0

## 2016-04-13 ENCOUNTER — Telehealth: Payer: Self-pay | Admitting: *Deleted

## 2016-04-13 NOTE — Telephone Encounter (Signed)
I called her back. She denies any pain, redness in her eyes, or blurry vision. Her left was early I is likely related to docetaxel. I explained this to her. And he wore gradually resolve on its own. She voiced good understanding. She is doing moderately well overall, had a more fatigue, low appetite after the last cycle chemotherapy. Diarrhea is mild to moderate, no other new complaints.  I will see her on her next cycle chemotherapy.  Truitt Merle  04/13/2016

## 2016-04-13 NOTE — Telephone Encounter (Signed)
VM message from patient stating that her left eye is now watering constantly.  She is currently on Taxotere and Carbolatin.  Pt would like to know what she can do about this.

## 2016-04-20 ENCOUNTER — Ambulatory Visit (HOSPITAL_BASED_OUTPATIENT_CLINIC_OR_DEPARTMENT_OTHER): Payer: BLUE CROSS/BLUE SHIELD | Admitting: Hematology

## 2016-04-20 ENCOUNTER — Other Ambulatory Visit: Payer: BLUE CROSS/BLUE SHIELD

## 2016-04-20 ENCOUNTER — Encounter: Payer: Self-pay | Admitting: *Deleted

## 2016-04-20 ENCOUNTER — Ambulatory Visit: Payer: BLUE CROSS/BLUE SHIELD

## 2016-04-20 ENCOUNTER — Ambulatory Visit (HOSPITAL_BASED_OUTPATIENT_CLINIC_OR_DEPARTMENT_OTHER): Payer: BLUE CROSS/BLUE SHIELD

## 2016-04-20 ENCOUNTER — Encounter: Payer: Self-pay | Admitting: Hematology

## 2016-04-20 ENCOUNTER — Telehealth: Payer: Self-pay | Admitting: Hematology

## 2016-04-20 ENCOUNTER — Other Ambulatory Visit (HOSPITAL_BASED_OUTPATIENT_CLINIC_OR_DEPARTMENT_OTHER): Payer: BLUE CROSS/BLUE SHIELD

## 2016-04-20 VITALS — BP 106/74 | HR 101 | Temp 98.2°F | Resp 18 | Ht 66.0 in | Wt 154.3 lb

## 2016-04-20 DIAGNOSIS — Z5111 Encounter for antineoplastic chemotherapy: Secondary | ICD-10-CM | POA: Diagnosis not present

## 2016-04-20 DIAGNOSIS — Z853 Personal history of malignant neoplasm of breast: Secondary | ICD-10-CM

## 2016-04-20 DIAGNOSIS — C50411 Malignant neoplasm of upper-outer quadrant of right female breast: Secondary | ICD-10-CM

## 2016-04-20 DIAGNOSIS — R197 Diarrhea, unspecified: Secondary | ICD-10-CM

## 2016-04-20 DIAGNOSIS — E119 Type 2 diabetes mellitus without complications: Secondary | ICD-10-CM

## 2016-04-20 DIAGNOSIS — Z5189 Encounter for other specified aftercare: Secondary | ICD-10-CM

## 2016-04-20 LAB — CBC WITH DIFFERENTIAL/PLATELET
BASO%: 0.5 % (ref 0.0–2.0)
BASOS ABS: 0 10*3/uL (ref 0.0–0.1)
EOS%: 0.2 % (ref 0.0–7.0)
Eosinophils Absolute: 0 10*3/uL (ref 0.0–0.5)
HCT: 33.7 % — ABNORMAL LOW (ref 34.8–46.6)
HEMOGLOBIN: 11.1 g/dL — AB (ref 11.6–15.9)
LYMPH%: 24.7 % (ref 14.0–49.7)
MCH: 31 pg (ref 25.1–34.0)
MCHC: 32.9 g/dL (ref 31.5–36.0)
MCV: 94.1 fL (ref 79.5–101.0)
MONO#: 0.6 10*3/uL (ref 0.1–0.9)
MONO%: 10.3 % (ref 0.0–14.0)
NEUT%: 64.3 % (ref 38.4–76.8)
NEUTROS ABS: 3.9 10*3/uL (ref 1.5–6.5)
Platelets: 177 10*3/uL (ref 145–400)
RBC: 3.58 10*6/uL — ABNORMAL LOW (ref 3.70–5.45)
RDW: 17.3 % — AB (ref 11.2–14.5)
WBC: 6 10*3/uL (ref 3.9–10.3)
lymph#: 1.5 10*3/uL (ref 0.9–3.3)

## 2016-04-20 LAB — COMPREHENSIVE METABOLIC PANEL
ALBUMIN: 3.9 g/dL (ref 3.5–5.0)
ALK PHOS: 70 U/L (ref 40–150)
ALT: 31 U/L (ref 0–55)
AST: 24 U/L (ref 5–34)
Anion Gap: 9 mEq/L (ref 3–11)
BUN: 7.7 mg/dL (ref 7.0–26.0)
CALCIUM: 9.4 mg/dL (ref 8.4–10.4)
CO2: 28 mEq/L (ref 22–29)
Chloride: 105 mEq/L (ref 98–109)
Creatinine: 0.8 mg/dL (ref 0.6–1.1)
Glucose: 89 mg/dl (ref 70–140)
POTASSIUM: 3.6 meq/L (ref 3.5–5.1)
Sodium: 142 mEq/L (ref 136–145)
Total Bilirubin: 0.69 mg/dL (ref 0.20–1.20)
Total Protein: 7.1 g/dL (ref 6.4–8.3)

## 2016-04-20 MED ORDER — SODIUM CHLORIDE 0.9 % IV SOLN
711.6000 mg | Freq: Once | INTRAVENOUS | Status: AC
  Administered 2016-04-20: 710 mg via INTRAVENOUS
  Filled 2016-04-20: qty 71

## 2016-04-20 MED ORDER — SODIUM CHLORIDE 0.9 % IV SOLN
75.0000 mg/m2 | Freq: Once | INTRAVENOUS | Status: AC
  Administered 2016-04-20: 130 mg via INTRAVENOUS
  Filled 2016-04-20: qty 13

## 2016-04-20 MED ORDER — SODIUM CHLORIDE 0.9 % IJ SOLN
10.0000 mL | INTRAMUSCULAR | Status: DC | PRN
  Administered 2016-04-20: 10 mL
  Filled 2016-04-20: qty 10

## 2016-04-20 MED ORDER — SODIUM CHLORIDE 0.9 % IJ SOLN
10.0000 mL | INTRAMUSCULAR | Status: DC | PRN
  Filled 2016-04-20: qty 10

## 2016-04-20 MED ORDER — SODIUM CHLORIDE 0.9 % IV SOLN
Freq: Once | INTRAVENOUS | Status: AC
  Administered 2016-04-20: 15:00:00 via INTRAVENOUS
  Filled 2016-04-20: qty 5

## 2016-04-20 MED ORDER — HEPARIN SOD (PORK) LOCK FLUSH 100 UNIT/ML IV SOLN
500.0000 [IU] | Freq: Once | INTRAVENOUS | Status: AC | PRN
  Administered 2016-04-20: 500 [IU]
  Filled 2016-04-20: qty 5

## 2016-04-20 MED ORDER — PALONOSETRON HCL INJECTION 0.25 MG/5ML
INTRAVENOUS | Status: AC
  Filled 2016-04-20: qty 5

## 2016-04-20 MED ORDER — SODIUM CHLORIDE 0.9 % IV SOLN
Freq: Once | INTRAVENOUS | Status: DC
  Filled 2016-04-20: qty 4

## 2016-04-20 MED ORDER — SODIUM CHLORIDE 0.9 % IV SOLN
Freq: Once | INTRAVENOUS | Status: AC
  Administered 2016-04-20: 14:00:00 via INTRAVENOUS

## 2016-04-20 MED ORDER — HEPARIN SOD (PORK) LOCK FLUSH 100 UNIT/ML IV SOLN
500.0000 [IU] | Freq: Once | INTRAVENOUS | Status: DC | PRN
  Filled 2016-04-20: qty 5

## 2016-04-20 MED ORDER — PALONOSETRON HCL INJECTION 0.25 MG/5ML
0.2500 mg | Freq: Once | INTRAVENOUS | Status: AC
  Administered 2016-04-20: 0.25 mg via INTRAVENOUS

## 2016-04-20 MED ORDER — PEGFILGRASTIM 6 MG/0.6ML ~~LOC~~ PSKT
6.0000 mg | PREFILLED_SYRINGE | Freq: Once | SUBCUTANEOUS | Status: AC
  Administered 2016-04-20: 6 mg via SUBCUTANEOUS
  Filled 2016-04-20: qty 0.6

## 2016-04-20 MED ORDER — SODIUM CHLORIDE 0.9 % IV SOLN: Freq: Once | INTRAVENOUS | Status: DC

## 2016-04-20 NOTE — Progress Notes (Signed)
Pt does not need IVF today per MD Burr Medico so NS rate was changed to be utilized as flush for chemo

## 2016-04-20 NOTE — Telephone Encounter (Signed)
Gave and printed appt shced and avs fo rpt for May and June °

## 2016-04-20 NOTE — Patient Instructions (Signed)
[place] Discharge Instructions for Patients Receiving Chemotherapy  Today you received the following chemotherapy agents Taxotere and Carboplatin AND Neulasta. To help prevent nausea and vomiting after your treatment, we encourage you to take your nausea medication as directed - NO ZOFRAN FOR 3 DAYS  If you develop nausea and vomiting that is not controlled by your nausea medication, call the clinic.   BELOW ARE SYMPTOMS THAT SHOULD BE REPORTED IMMEDIATELY:  *FEVER GREATER THAN 100.5 F  *CHILLS WITH OR WITHOUT FEVER  NAUSEA AND VOMITING THAT IS NOT CONTROLLED WITH YOUR NAUSEA MEDICATION  *UNUSUAL SHORTNESS OF BREATH  *UNUSUAL BRUISING OR BLEEDING  TENDERNESS IN MOUTH AND THROAT WITH OR WITHOUT PRESENCE OF ULCERS  *URINARY PROBLEMS  *BOWEL PROBLEMS  UNUSUAL RASH Items with * indicate a potential emergency and should be followed up as soon as possible.  Feel free to call the clinic you have any questions or concerns. The clinic phone number is [phone].  Please show the [facility] at check-in to the Emergency Department and triage nurse.

## 2016-04-20 NOTE — Patient Instructions (Signed)

## 2016-04-20 NOTE — Progress Notes (Signed)
[place]  [phone] [phone]) [phone]  Clinic follow Up Note   Patient Care Team: Kristie Cowman, MD as PCP - General (Family Medicine) Stark Klein, MD as Consulting Physician (General Surgery) Truitt Merle, MD as Consulting Physician (Hematology) Arloa Koh, MD as Consulting Physician (Radiation Oncology) Mauro Kaufmann, RN as Registered Nurse Rockwell Germany, RN as Registered Nurse Sylvan Cheese, NP as Nurse Practitioner (Nurse Practitioner) 04/20/2016  CHIEF COMPLAINTS:  Follow up right breast cancer  Oncology History   Breast cancer [facility])   Staging form: Breast, AJCC 7th Edition     Pathologic stage from 11/25/2015: Stage IIA (T1c, N1a, cM0) - Unsigned     Pathologic stage from 11/25/2015: Stage IIA (T1c, N1a, cM0) - Unsigned Breast cancer of upper-outer quadrant of right female breast [facility])   Staging form: Breast, AJCC 7th Edition     Clinical stage from 09/15/2015: Stage IA (T1b, N0, M0) - Signed by Truitt Merle, MD on 12/16/2015     Pathologic stage from 11/25/2015: Stage IIA (T1c, N1a, cM0) - Signed by Truitt Merle, MD on 12/16/2015         Breast cancer of upper-outer quadrant of right female breast ([place])   09/07/2015 Mammogram Diagnostic mammogram and the ultrasound of the right breast showed a 0.8 cm lobulated mass in the right breast 11 to 12:00 position 9 cm from the nipple. No other lesions or adenopathy.   09/08/2015 Initial Diagnosis Breast cancer of upper-outer quadrant of right female breast ([place])   09/08/2015 Initial Biopsy Right breast mass at the upper outer quadrant core needle biopsy showed invasive ductal carcinoma, grade 2-3, ductal carcinoma in situ.   09/08/2015 Receptors her2 ER 100% positive, PR 100% positive, Ki-67 60%, HER-2 positive with copy # 4.25 and ratio 2.58   11/25/2015 Surgery Right breast mastectomy and sentinel lymph node biopsy   11/25/2015 Pathology Results Right breast invasive ductal carcinoma, grade 2, 2.0  cm, DCIS, intermediate grade, (+) LVI, margins were negative, 1 out of 3 lymph nodes were negative. Left breast ostectomy was negative for malignancy.   01/06/2016 -  Chemotherapy adjuvant chemotherapy TCH P, every 3 weeks, plan for 6 weeks     HISTORY OF PRESENTING ILLNESS:  Sandra James 50 y.o. female with past medical history of stage I left breast cancer, is here because of newly diagnosed right breast cancer. She presents to our multidisciplinary breast clinic by herself.  The right breast cancer was discovered by screening mammogram. She did not have any palpable mass, or any constitutional symptoms. She was diagnosed with stage I left breast cancer at age of 34, she had lumpectomy, radiation, and adjuvant chemotherapy and 5 years of tamoxifen. She was treated by Dr. Louann Sjogren in [place]. She moved to [facility] about year ago due to job change. She has been very compliant with annual screening mammogram.  She had hysterectomy and bilateral oophorectomy and sphincterectomy 5 years ago for heavy bleeding.. She has been having hot flashes since then, moderate, but manageable. She is single, lives alone, works for 2 to [place] has to go up children who live in [place].  CURRENT THERAPY: Adjuvant chemotherapy with docetaxel, carboplatin, Herceptin and pejeta (TCHP) every 3 weeks started on 01/06/2016,  Herceptin and Pejeta was held for cycle 5 and 6 due to study decreased EF.  INTERIM HISTORY Sandra James returns for follow-up and 6th cycle chemo. She  Overall tolerated the last cycle chemotherapy moderately well.  Her diarrhea lasted longer  after chemotherapy,  But she was able to eat and drink any adequately. Her bowel movement finally became normal about 5 days ago, and she overall feels much better this week.  She has some numbness during the docetaxel infusion last time, resolved spontaneously. She also reports worsening watery eyes since last chemotherapy , no vision change, no headaches  or other new complaints.  She is very happy that this is her last cycle chemotherapy.  MED ICAL HISTORY:  Past Medical History  Diagnosis Date  . MVP (mitral valve prolapse)   . Infection     UTI  . Anxiety   . Fibroid   . Cancer [facility])     left brast lumpectomy   . Breast cancer of upper-outer quadrant of right female breast ([place]) 09/09/2015  . Diabetes mellitus without complication ([place])   . Depression   . GERD (gastroesophageal reflux disease)   . Complication of anesthesia     slow to wake up- BP was low, fainted next day-  . Breast cancer ([place]) 09/07/15    right breast  . Allergy   . UTI (lower urinary tract infection)   . Neuromuscular disorder ([place])     sciatic nerve paion left side/leg    SURGICAL HISTORY: Past Surgical History  Procedure Laterality Date  . Abdominal hysterectomy    . Breast surgery  2004    left lumpectomy  . Oophorectomy Bilateral   . Mastectomy w/ sentinel node biopsy Bilateral 11/25/2015    Procedure: BILATERAL SKIN SPARING MASTECTOMIES WITH RIGHT SENTINEL LYMPH NODE BIOPSY;  Surgeon: Stark Klein, MD;  Location: [place];  Service: General;  Laterality: Bilateral;  . Breast reconstruction with placement of tissue expander and flex hd (acellular hydrated dermis) Bilateral 11/25/2015    Procedure: BILATERAL IMMEDIATE BREAST RECONSTRUCTION WITH PLACEMENT OF TISSUE EXPANDERS AND FLEX HD (ACELLULAR HYDRATED DERMIS);  Surgeon: Wallace Going, DO;  Location: [place];  Service: Plastics;  Laterality: Bilateral;  . Portacath placement Left 12/20/2015    Procedure: INSERTION PORT-A-CATH, left chest;  Surgeon: Stark Klein, MD;  Location: [place] [facility];  Service: General;  Laterality: Left;    SOCIAL HISTORY: Social History   Social History  . Marital Status: Single     Spouse Name: N/A  . Number of Children: 2 children, 1 daughter and 57 yo son    . Years of Education: N/A   Occupational History  . She is a Barista  rep   Social History Main Topics  . Smoking status: Never Smoker   . Smokeless tobacco: Never Used  . Alcohol Use: Yes     Comment: 2-3/wk  . Drug Use: No  . Sexual Activity: Yes    Birth Control/ Protection: Surgical   Other Topics Concern  . Not on file   Social History Narrative    FAMILY HISTORY: Family History  Problem Relation Age of Onset  . Hypertension Mother   . Stroke Mother   . Alzheimer's disease Mother   . Heart disease Father   . Stroke Father   . Heart attack Father   . Breast cancer Sister 42    double mastectomy  . Other Sister     "stomach tumor that wrapped around reproductive organs"; dx. 28s; required partial hysterectomy  . Alzheimer's disease Maternal Grandmother   . Alzheimer's disease Paternal Grandmother   . Other Other     had to have lymph nodes removed and radiation; dx. 88s  . Cancer Maternal Aunt  unspecified type; dx. <50  . Colon cancer Maternal Uncle     dx. 46s  . Breast cancer Cousin     dx. 22s    ALLERGIES:  is allergic to lisinopril; oxycodone; and percocet.  MEDICATIONS:  Current Outpatient Prescriptions  Medication Sig Dispense Refill  . dexamethasone (DECADRON) 4 MG tablet Take 2 tablets (8 mg total) by mouth 2 (two) times daily. Start the day before Taxotere. Then again the day after chemo for 3 days. 30 tablet 1  . diazepam (VALIUM) 2 MG tablet   0  . fluticasone (FLONASE) 50 MCG/ACT nasal spray Place 1 spray into both nostrils daily as needed for allergies.   0  . ibuprofen (ADVIL,MOTRIN) 800 MG tablet Take 800 mg by mouth daily as needed for headache or moderate pain.    Marland Kitchen lidocaine-prilocaine (EMLA) cream Apply to affected area once 30 g 3  . magic mouthwash SOLN Take 5 mLs by mouth 4 (four) times daily as needed for mouth pain. 320 mL 1  . mirtazapine (REMERON) 30 MG tablet Reported on 03/30/2016  3  . ondansetron (ZOFRAN) 8 MG tablet Take 1 tablet (8 mg total) by mouth 2 (two) times daily. Start the day after  chemo for 3 days. Then take as needed for nausea or vomiting. 30 tablet 1  . polyethylene glycol (MIRALAX / GLYCOLAX) packet Take 17 g by mouth daily as needed for mild constipation. 14 each 0  . potassium chloride SA (K-DUR,KLOR-CON) 20 MEQ tablet Take 1 tablet (20 mEq total) by mouth daily. 30 tablet 1  . prochlorperazine (COMPAZINE) 10 MG tablet Take 1 tablet (10 mg total) by mouth every 6 (six) hours as needed (Nausea or vomiting). 30 tablet 1  . zolpidem (AMBIEN) 10 MG tablet Take 1 tablet (10 mg total) by mouth at bedtime as needed for sleep. 30 tablet 0  . carvedilol (COREG) 3.125 MG tablet Take 1 tablet (3.125 mg total) by mouth 2 (two) times daily. (Patient not taking: Reported on 03/30/2016) 180 tablet 3   No current facility-administered medications for this visit.   Facility-Administered Medications Ordered in Other Visits  Medication Dose Route Frequency Provider Last Rate Last Dose  . 0.9 %  sodium chloride infusion   Intravenous Once Truitt Merle, MD      . CARBOplatin (PARAPLATIN) 710 mg in sodium chloride 0.9 % 250 mL chemo infusion  710 mg Intravenous Once Truitt Merle, MD      . DOCEtaxel (TAXOTERE) 130 mg in sodium chloride 0.9 % 250 mL chemo infusion  75 mg/m2 (Treatment Plan Actual) Intravenous Once Truitt Merle, MD 175 mL/hr at 04/20/16 1515 130 mg at 04/20/16 1515  . heparin lock flush 100 unit/mL  500 Units Intracatheter Once PRN Truitt Merle, MD      . heparin lock flush 100 unit/mL  500 Units Intracatheter Once PRN Truitt Merle, MD      . ondansetron (ZOFRAN) 8 mg in sodium chloride 0.9 % 50 mL IVPB   Intravenous Once Truitt Merle, MD      . pegfilgrastim (NEULASTA ONPRO KIT) injection 6 mg  6 mg Subcutaneous Once Truitt Merle, MD      . sodium chloride 0.9 % injection 10 mL  10 mL Intracatheter PRN Truitt Merle, MD   10 mL at 01/06/16 1730  . sodium chloride 0.9 % injection 10 mL  10 mL Intracatheter PRN Truitt Merle, MD      . sodium chloride 0.9 % injection 10 mL  10 mL  Intracatheter PRN Truitt Merle, MD         REVIEW OF SYSTEMS:   Constitutional: Denies fevers, chills or abnormal night sweats Eyes: Denies blurriness of vision, double vision or watery eyes Ears, nose, mouth, throat, and face: Denies mucositis or sore throat Respiratory: Denies cough, dyspnea or wheezes Cardiovascular: Denies palpitation, chest discomfort or lower extremity swelling Gastrointestinal:  Denies nausea, heartburn or change in bowel habits Skin: Denies abnormal skin rashes Lymphatics: Denies new lymphadenopathy or easy bruising Neurological:Denies numbness, tingling or new weaknesses Behavioral/Psych: Mood is stable, no new changes  All other systems were reviewed with the patient and are negative.  PHYSICAL EXAMINATION: ECOG PERFORMANCE STATUS: 1  Filed Vitals:   04/20/16 1305  BP: 106/74  Pulse: 101  Temp: 98.2 F (36.8 C)  Resp: 18   Filed Weights   04/20/16 1305  Weight: 154 lb 4.8 oz (69.99 kg)    GENERAL:alert, no distress and comfortable SKIN: skin color, texture, turgor are normal, no rashes or significant lesions EYES: normal, conjunctiva are pink and non-injected, sclera clear OROPHARYNX:no exudate, no erythema and lips, buccal mucosa, and tongue normal  NECK: supple, thyroid normal size, non-tender, without nodularity LYMPH:  no palpable lymphadenopathy in the cervical, axillary or inguinal LUNGS: clear to auscultation and percussion with normal breathing effort HEART: regular rate & rhythm and no murmurs and no lower extremity edema ABDOMEN:abdomen soft, non-tender and normal bowel sounds Musculoskeletal:no cyanosis of digits and no clubbing  PSYCH: alert & oriented x 3 with fluent speech NEURO: no focal motor/sensory deficits Breasts: s/p bilateral mastectomy and tissue spender placement. Surgical incision sites are clean, no surrounding skin erythema or discharge.    LABORATORY DATA:  I have reviewed the data as listed CBC Latest Ref Rng 04/20/2016 03/30/2016 03/12/2016  WBC 3.9 -  10.3 10e3/uL 6.0 6.6 30.5(H)  Hemoglobin 11.6 - 15.9 g/dL 11.1(L) 11.4(L) 11.3(L)  Hematocrit 34.8 - 46.6 % 33.7(L) 34.1(L) 33.1(L)  Platelets 145 - 400 10e3/uL 177 216 209    CMP Latest Ref Rng 04/20/2016 03/30/2016 03/12/2016  Glucose 70 - 140 mg/dl 89 96 94  BUN 7.0 - 26.0 mg/dL 7.7 8.4 11  Creatinine 0.6 - 1.1 mg/dL 0.8 0.8 0.62  Sodium 136 - 145 mEq/L 142 141 140  Potassium 3.5 - 5.1 mEq/L 3.6 3.6 3.2(L)  Chloride 101 - 111 mmol/L - - 103  CO2 22 - 29 mEq/L 28 27 27   Calcium 8.4 - 10.4 mg/dL 9.4 9.3 9.2  Total Protein 6.4 - 8.3 g/dL 7.1 7.0 7.5  Total Bilirubin 0.20 - 1.20 mg/dL 0.69 0.65 0.9  Alkaline Phos 40 - 150 U/L 70 73 85  AST 5 - 34 U/L 24 26 51(H)  ALT 0 - 55 U/L 31 33 78(H)     Pathology report Diagnosis 11/25/2015 1. Breast, simple mastectomy, right - INVASIVE DUCTAL CARCINOMA, GRADE 2/3, SPANNING 2.0 CM. - DUCTAL CARCINOMA IN SITU, INTERMEDIATE GRADE. - LYMPHOVASCULAR INVASION IS IDENTIFIED. - LOBULAR NEOPLASIA (ATYPICAL LOBULAR HYPERPLASIA). - THE SURGICAL RESECTION MARGINS ARE NEGATIVE FOR CARCINOMA. - SEE ONCOLOGY TABLE BELOW. 2. Lymph node, sentinel, biopsy, right axillary #1 - METASTATIC CARCINOMA IN 1 OF 1 LYMPH NODE (1/1). 3. Lymph node, sentinel, biopsy, right axillary #2 - THERE IS NO EVIDENCE OF CARCINOMA IN 1 OF 1 LYMPH NODE (0/1). 4. Lymph node, sentinel, biopsy, right axillary #3 - THERE IS NO EVIDENCE OF CARCINOMA IN 1 OF 1 LYMPH NODE (0/1). 5. Breast, simple mastectomy, left - BENIGN BREAST PARENCHYMA WITH DENSE STROMAL  FIBROSIS. - FIBROADENOMA. - THERE IS NO EVIDENCE OF MALIGNANCY. 6. Breast, excision, left, additional lateral margin - BENIGN FIBROADIPOSE TISSUE. - THERE IS NO EVIDENCE OF MALIGNANCY. - SEE COMMENT. Microscopic Comment 1. BREAST, INVASIVE TUMOR, WITH LYMPH NODES PRESENT Specimen, including laterality and lymph node sampling (sentinel, non-sentinel): Right breast and right axillary lymph nodes Procedure: Bilateral  mastectomy and multiple right axillary lymph node resections Histologic type: Ductal Grade: 2 Tubule formation: 2 1 of 4 FINAL for Prowse, Leighann ([id number]) Microscopic Comment(continued) Nuclear pleomorphism: 2 Mitotic: 2 Tumor size (gross measurement): 2.0 cm Margins: Negative for carcinoma Invasive, distance to closest margin: 1.8 cm to the posterior margin In-situ, distance to closest margin: 1.8 cm to the posterior margin Lymphovascular invasion: Present Ductal carcinoma in situ: Present Grade: Intermediate grade Extensive intraductal component: Not identified Lobular neoplasia: Present, atypical lobular hyperplasia Tumor focality: Unifocal Treatment effect: Not identified Extent of tumor: Confined to breast parenchyma Lymph nodes: Examined: 3 Sentinel 0 Non-sentinel 3 Total Lymph nodes with metastasis: 1 (macrometastasis) - no extracapsular extension is identified. Breast prognostic profile: [phone] Estrogen receptor: 100%, strong staining intensity Progesterone receptor: 100%, strong staining intensity Her 2 neu: Amplification was detected. The ratio was 2.58 Ki-67: 60% TNM: pT1c, pN1a Non-neoplastic breast: No significant findings. 6. The surgical resection margin(s) of the specimen were inked and microscopically evaluated. Enid Cutter MD Pathologist, Electronic Signature (Case signed 11/29/2015)   RADIOGRAPHIC STUDIES: I have personally reviewed the radiological images as listed and agreed with the findings in the report.  Bone scan 12/28/2014 IMPRESSION: Foci of increased tracer localization at the sternum, unable to exclude metastases.  Uptake at inferior cervical spine could potentially be related to degenerative disc disease changes present at C5-C6 on CT.  Questionable abnormal increased tracer localization at the posterior LEFT ninth and tenth ribs.  CT chest, abdomen and pelvis 12/28/2014 IMPRESSION: 1. 2 cm enhancing lesion in segment  5 of the liver is indeterminate. It could reflect FNH, vascular shunt and less likely metastasis. Recommend MRI abdomen without and with contrast using Eovist for further evaluation. No other hepatic lesions. 2. No findings for metastatic disease involving the chest. There is a focal area of airspace disease in the superior segment of the right lower lobe which could be a developing or resolving infiltrate. Recommend correlation with clinical symptoms.  ECHO 03/21/2016 Study Conclusions  - Left ventricle: The cavity size was normal. Wall thickness was  normal. Systolic function was normal. The estimated ejection  fraction was in the range of 50% to 55%. Wall motion was normal;  there were no regional wall motion abnormalities. Doppler  parameters are consistent with abnormal left ventricular  relaxation (grade 1 diastolic dysfunction). - Impressions: Lateral s&' 9.2 cm/sec. GLS -16.1%  Impressions:  - Lateral s&' 9.2 cm/sec. GLS -16.1%   ASSESSMENT & PLAN:  50 year old African-American female, surgical postmenopausal, history of stage I left breast cancer, with newly diagnosed right stage I breast cancer.  1. Right breast invasive ductal carcinoma, pT1cN1aM0, stage IIB, grade 2, ER100%+/PR100%+/HER2+ -I previously reviewed her surgical pathology results with patient in great details. She has 1 out of 3 sentinel lymph nodes positive, her pathological stage is more advanced than her initial clinical stage.  -We reviewed the natural history of triple positive breast cancer. HER-2 positive tumors are more progressive, with higher risk of recurrence -I reviewed her restaging CT scan and bone scan images with her in person. I discussed the bone scan findings with radiologist Dr. Thornton Papas. The mild hypermetabolic uptake  in the sternum and left ninth and 10th rib have no significant corresponding change on the CT scan, except for small lytic spot in the sternum. They're not amenable for  biopsy, PET scan may not have much additional value, the suspicion for metastases from breast cancer is low. I recommend follow-up. The 2 cm lesion in the liver are likely benign, unfortunately she is not able to do liver MRI, due to her breast implants.  -She is still concerned about the above scan findings. I recommend to have a repeated CT or PET scan a few months after she completes adjuvant chemotherapy for follow-up. -Given her high risk of cancer recurrence, I recommend adjuvant chemotherapy with docetaxel, carboplatin, Herceptin and Perjeta (TCHP), every 3 weeks for 6 cycles,  followed by maintenance Herceptin to complete a 1 year therapy. She previously received Adriamycin, will not be able to tolerate more adrimycin. -She is tolerating chemotherapy moderately well, with the main side effects of diarrhea, which is manageable. Lab results reviewed with her, adequate for treatment, we'll continue.  - Due to her slightly decreased of EF on the repeat echo, Dr. Haroldine Laws has recommended to hold  Herceptin and pejeta and repeat echo in 6-8 weeks. She tried Coreg but could not tolerate due to the fatigue  - I'll continue her cycle 5 and cycle 6 chemotherapy with  Carboplatin and  Docetaxel,  Lab results reviewed with her, adequate for treatment, she will have her last cycle chemotherapy today.  2. Diarrhea  -Secondary to chemotherapy. -I recommend her to use Imodium as needed but she is afraid of using it due to pneumonia related constipation. She drinks fluids adequately to avoid dehydration. -I'll schedule her to return in 5 days for IV fluids.  3. Genetics -Due to her young age and recurrent breast cancer, she was referred to see a genetic counselor in our cancer center. -Her genetic test was negative  4. DM -she will continue follow-up with her primary care physician -She is on metformin -We again reviewed steroids induced hyperglycemia, she'll monitor her sugar closely at home, especially  after chemotherapy.  5. Bone health -She is postmenopausal by surgery -I encouraged her to take calcium and vitamin D -I'll obtain a baseline bone density scan before she starts AI   6. Insomnia and low appetite  -continue mirtazapine,  And Ambien - I given her a new prescription of Ambien 10 mg today  7. Watery eyes -secondary to  Docetaxel. No vision change. We'll continue monitoring.  Plan for today  -Lab reviewed, adequate for treatment, cycle 6 today with carboplatin and docetaxel, hold  Herceptin and pejeta -IVF in 5 days  - she will see Dr. Haroldine Laws and have repeat echo on May 30th  -RTC in 3 weeks with lab, f/p and  Maintenance Herceptin  All questions were answered. The patient knows to call the clinic with any problems, questions or concerns.  I spent 25 minutes counseling the patient face to face. The total time spent in the appointment was 30 minutes and more than 50% was on counseling.     Truitt Merle, MD 04/20/2016

## 2016-04-24 ENCOUNTER — Encounter: Payer: Self-pay | Admitting: Hematology

## 2016-04-24 ENCOUNTER — Telehealth: Payer: Self-pay | Admitting: *Deleted

## 2016-04-24 NOTE — Telephone Encounter (Signed)
Pt called and left message re:  Had chemo last Thursday  04/20/16.  Pt went home with Neulasta onpro.  Stated she did not think Neulasta device was working correctly.   Attempted to call pt back unsuccessfully.   Unable to leave message due to phone ringing and then had busy signal. Pt's  Phone     [phone].

## 2016-04-24 NOTE — Progress Notes (Signed)
Faxes sent 01/06/16     12/24/15   02/08/16  02/11/16  01/19/16   I sent to medical recrds

## 2016-04-24 NOTE — Telephone Encounter (Signed)
Pt returned nurse call.  Spoke with pt and was informed that pt did not think she had Neulasta onpro since pt did not experience any symptoms such as achiness, nausea, fatigue as much.  Asked if the injector indicated empty or still at full level.  Pt stated it was at empty mark.  Instructed pt to bring the injector in when pt comes for IVF on 04/25/16 for nurse to evaluate.  Pt voiced understanding.

## 2016-04-25 ENCOUNTER — Ambulatory Visit (HOSPITAL_BASED_OUTPATIENT_CLINIC_OR_DEPARTMENT_OTHER): Payer: BLUE CROSS/BLUE SHIELD

## 2016-04-25 VITALS — BP 97/74 | HR 100 | Temp 99.0°F | Resp 20

## 2016-04-25 DIAGNOSIS — Z5189 Encounter for other specified aftercare: Secondary | ICD-10-CM

## 2016-04-25 DIAGNOSIS — C50411 Malignant neoplasm of upper-outer quadrant of right female breast: Secondary | ICD-10-CM | POA: Diagnosis not present

## 2016-04-25 MED ORDER — HEPARIN SOD (PORK) LOCK FLUSH 100 UNIT/ML IV SOLN
500.0000 [IU] | Freq: Once | INTRAVENOUS | Status: AC | PRN
  Administered 2016-04-25: 500 [IU]
  Filled 2016-04-25: qty 5

## 2016-04-25 MED ORDER — SODIUM CHLORIDE 0.9 % IV SOLN
Freq: Once | INTRAVENOUS | Status: AC
  Administered 2016-04-25: 10:00:00 via INTRAVENOUS

## 2016-04-25 MED ORDER — ONDANSETRON HCL 40 MG/20ML IJ SOLN
Freq: Once | INTRAMUSCULAR | Status: AC
  Administered 2016-04-25: 10:00:00 via INTRAVENOUS
  Filled 2016-04-25: qty 4

## 2016-04-25 MED ORDER — PROMETHAZINE HCL 25 MG/ML IJ SOLN
25.0000 mg | Freq: Once | INTRAMUSCULAR | Status: DC
  Filled 2016-04-25: qty 1

## 2016-04-25 MED ORDER — SODIUM CHLORIDE 0.9 % IJ SOLN
10.0000 mL | INTRAMUSCULAR | Status: DC | PRN
  Administered 2016-04-25: 10 mL
  Filled 2016-04-25: qty 10

## 2016-04-25 NOTE — Patient Instructions (Signed)

## 2016-04-25 NOTE — Progress Notes (Addendum)
Follow UP New Consult  Now Right Breast Upper Outer Quadrant  Current therapy with Dr. Burr Medico 's note: 04/20/2016:  CURRENT THERAPY: Adjuvant chemotherapy with docetaxel, carboplatin, Herceptin and pejeta (TCHP) every 3 weeks started on 01/06/2016, Herceptin and Pejeta was held for cycle 5 and 6 due to study decreased EF. Cycle 5 and cycle 6 chemotherapy with Carboplatin and Docetaxel, last treatment today 04/20/16, IVF"S 5 days , Maintenance Herceptin. IV fluids for hydration on yesterday.   Sandra James denies any pain today, but admits to having diarrhea when she eats "a lot". Neuropathy of fingers and toes.  BP 91/58 mm Hg/Pulse 116/Temp 98.4 F/ - sitting BP 98/67 mmHg  Pulse 122  Temp(Src) 98.4 F (36.9 C)  Ht 5\' 6"  (1.676 m)  Wt 151 lb 3.2 oz (68.584 kg)  BMI 24.42 kg/m-standing

## 2016-04-25 NOTE — Progress Notes (Signed)
Patient brought used Onpro cartridge in for clinician to inspect. Patient states that cartridge and adhesive were not wet when she took it off. The cartridge indicated it was empty. The plastic catheter was intact.  Patient was concerned that she didn't receive medication because she "didn't see the nurse put the medicine in." I reassured patient that medicine must be injected in order to activate cartridge. Of note- patient did not leave Onpro on for one hour as instructed after medicine began injecting. She states she took it off when it said empty.

## 2016-04-26 ENCOUNTER — Encounter: Payer: Self-pay | Admitting: Radiation Oncology

## 2016-04-26 ENCOUNTER — Telehealth: Payer: Self-pay | Admitting: *Deleted

## 2016-04-26 ENCOUNTER — Ambulatory Visit
Admission: RE | Admit: 2016-04-26 | Discharge: 2016-04-26 | Disposition: A | Discharge status: Home / Self Care | Payer: BLUE CROSS/BLUE SHIELD | Source: Ambulatory Visit | Attending: Radiation Oncology | Admitting: Radiation Oncology

## 2016-04-26 VITALS — BP 98/67 | HR 122 | Temp 98.4°F | Ht 66.0 in | Wt 151.2 lb

## 2016-04-26 DIAGNOSIS — E119 Type 2 diabetes mellitus without complications: Secondary | ICD-10-CM | POA: Diagnosis not present

## 2016-04-26 DIAGNOSIS — Z923 Personal history of irradiation: Secondary | ICD-10-CM | POA: Insufficient documentation

## 2016-04-26 DIAGNOSIS — Z7984 Long term (current) use of oral hypoglycemic drugs: Secondary | ICD-10-CM | POA: Diagnosis not present

## 2016-04-26 DIAGNOSIS — Z170 Estrogen receptor positive status [ER+]: Secondary | ICD-10-CM | POA: Diagnosis not present

## 2016-04-26 DIAGNOSIS — C50411 Malignant neoplasm of upper-outer quadrant of right female breast: Secondary | ICD-10-CM | POA: Insufficient documentation

## 2016-04-26 DIAGNOSIS — I341 Nonrheumatic mitral (valve) prolapse: Secondary | ICD-10-CM | POA: Insufficient documentation

## 2016-04-26 DIAGNOSIS — Z853 Personal history of malignant neoplasm of breast: Secondary | ICD-10-CM | POA: Diagnosis not present

## 2016-04-26 DIAGNOSIS — Z79899 Other long term (current) drug therapy: Secondary | ICD-10-CM | POA: Insufficient documentation

## 2016-04-26 DIAGNOSIS — I890 Lymphedema, not elsewhere classified: Secondary | ICD-10-CM

## 2016-04-26 DIAGNOSIS — Z9013 Acquired absence of bilateral breasts and nipples: Secondary | ICD-10-CM | POA: Diagnosis not present

## 2016-04-26 DIAGNOSIS — Z9889 Other specified postprocedural states: Secondary | ICD-10-CM | POA: Diagnosis not present

## 2016-04-26 NOTE — Telephone Encounter (Signed)
Called pt & she reports that she still feels tired & has been having a hard time getting a lot of fluids in due to taste changes.  She doesn't feel that she needs to come in tomorrow but will try her best to drink as much as she can tonight & will check her BP & pulse tomorrow & call us with an update.

## 2016-04-26 NOTE — Progress Notes (Signed)
Radiation Oncology         [phone]) [phone] ________________________________  Name: Sandra James MRN: [id number]  Date: 04/26/2016  DOB: 18-Aug-1966  Follow-Up Visit Note  CC: Sandra Frames, MD  Sandra Klein, MD  Diagnosis:   Breast cancer of upper-outer quadrant of right female breast [facility])   Staging form: Breast, AJCC 7th Edition     Clinical stage from 09/15/2015: Stage IA (T1b, N0, M0) - Signed by Sandra Merle, MD on 12/16/2015     Pathologic stage from 11/25/2015: Stage IIA (T1c, N1a, cM0) - Signed by Sandra Merle, MD on 12/16/2015  Narrative:  [name] is here today for follow up prior to radiation treatment. I initially saw her back in January. At the time of a screening mammogram she was found to have suspicious calcifications within the right breast. Ultrasound showed a 0.8 cm mass at approximately 11 to 12:00. This was biopsied on 09/07/2015 and felt to be diagnostic for invasive ductal carcinoma/DCIS. Her disease was ER and PR positive at 100% with an elevated Ki-67 of 60%. The tumor was also HER-2/neu positive. She has had a previous left breast cancer and did have negative BRCA testing in 2011; she has undergone BSO in the past. Her left breast cancer was diagnosed at age 29, and she was treated in [place] with lumpectomy, radiation, and adjuvant chemotherapy and 5 years of tamoxifen.   She had a bilateral skin sparing mastectomy with right sentinel lymph node biopsy with tissue expander placement on 11/25/2015. Final pathology revealed benign left breast tissue. 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 Resection margins were negative, and of the 3 nodes sampled, one did contain metastatic disease. She has undergone and completed adjuvant chemotherapy treatment with docetaxel, carboplatin, Herceptin, and Pejeta (TCHP) under the care of Dr. Burr James. Her last treatment was today. She will be continuing with maintenance Herceptin.                      On review of systems, the patient denies any pain. She has been experiencing lymphedema that is more prominent in the right arm. She reports that she does have diarrhea when she eats "a lot." She also reports neuropathy in her fingers and toes.   Prior Radiation: Yes; Left breast   Past Medical History:  Past Medical History  Diagnosis Date  . MVP (mitral valve prolapse)   . Infection     UTI  . Anxiety   . Fibroid   . Cancer [facility])     left brast lumpectomy   . Breast cancer of upper-outer quadrant of right female breast (Sandra James) 09/09/2015  . Diabetes mellitus without complication ([place])   . Depression   . GERD (gastroesophageal reflux disease)   . Complication of anesthesia     slow to wake up- BP was low, fainted next day-  . Breast cancer ([place]) 09/07/15    right breast  . Allergy   . UTI (lower urinary tract infection)   . Neuromuscular disorder ([place])     sciatic nerve paion left side/leg    Past Surgical History: Past Surgical History  Procedure Laterality Date  . Abdominal hysterectomy    . Breast surgery  2004    left lumpectomy  . Oophorectomy Bilateral   . Mastectomy w/ sentinel node biopsy Bilateral 11/25/2015    Procedure: BILATERAL SKIN SPARING MASTECTOMIES WITH RIGHT SENTINEL LYMPH NODE BIOPSY;  Surgeon: Sandra Klein, MD;  Location: [place];  Service: General;  Laterality: Bilateral;  . Breast reconstruction with placement of tissue expander and flex hd (acellular hydrated dermis) Bilateral 11/25/2015    Procedure: BILATERAL IMMEDIATE BREAST RECONSTRUCTION WITH PLACEMENT OF TISSUE EXPANDERS AND FLEX HD (ACELLULAR HYDRATED DERMIS);  Surgeon: Wallace Going, DO;  Location: [place];  Service: Plastics;  Laterality: Bilateral;  . Portacath placement Left 12/20/2015    Procedure: INSERTION PORT-A-CATH, left chest;  Surgeon: Sandra Klein, MD;  Location: [place] OR;  Service: General;  Laterality: Left;    Social  History:  Social History   Social History  . Marital Status: Single    Spouse Name: N/A  . Number of Children: N/A  . Years of Education: N/A   Occupational History  . Not on file.   Social History Main Topics  . Smoking status: Never Smoker   . Smokeless tobacco: Never Used     Comment: previous secondhand smoke exposure  . Alcohol Use: 2.4 oz/week    4 Glasses of wine per week     Comment: 1 bottle of wine per wk  . Drug Use: No  . Sexual Activity: Yes    Birth Control/ Protection: Surgical   Other Topics Concern  . Not on file   Social History Narrative    Family History: Family History  Problem Relation Age of Onset  . Hypertension Mother   . Stroke Mother   . Alzheimer's disease Mother   . Heart disease Father   . Stroke Father   . Heart attack Father   . Breast cancer Sister 84    double mastectomy  . Other Sister     "stomach tumor that wrapped around reproductive organs"; dx. 60s; required partial hysterectomy  . Alzheimer's disease Maternal Grandmother   . Alzheimer's disease Paternal Grandmother   . Other Other     had to have lymph nodes removed and radiation; dx. 54s  . Cancer Maternal Aunt     unspecified type; dx. <50  . Colon cancer Maternal Uncle     dx. 4s  . Breast cancer Cousin     dx. 63s    ALLERGIES:  is allergic to lisinopril; oxycodone; and percocet.  Meds: Current Outpatient Prescriptions  Medication Sig Dispense Refill  . diazepam (VALIUM) 2 MG tablet   0  . fluticasone (FLONASE) 50 MCG/ACT nasal spray Place 1 spray into both nostrils daily as needed for allergies.   0  . ibuprofen (ADVIL,MOTRIN) 800 MG tablet Take 800 mg by mouth daily as needed for headache or moderate pain.    Marland Kitchen lidocaine-prilocaine (EMLA) cream Apply to affected area once 30 g 3  . magic mouthwash SOLN Take 5 mLs by mouth 4 (four) times daily as needed for mouth pain. 320 mL 1  . mirtazapine (REMERON) 30 MG tablet Reported on 03/30/2016  3  . ondansetron  (ZOFRAN) 8 MG tablet Take 1 tablet (8 mg total) by mouth 2 (two) times daily. Start the day after chemo for 3 days. Then take as needed for nausea or vomiting. 30 tablet 1  . polyethylene glycol (MIRALAX / GLYCOLAX) packet Take 17 g by mouth daily as needed for mild constipation. 14 each 0  . potassium chloride SA (K-DUR,KLOR-CON) 20 MEQ tablet Take 1 tablet (20 mEq total) by mouth daily. 30 tablet 1  . prochlorperazine (COMPAZINE) 10 MG tablet Take 1 tablet (10 mg total) by mouth every 6 (six) hours as needed (Nausea or vomiting). [place]  tablet 1  . zolpidem (AMBIEN) 10 MG tablet Take 1 tablet (10 mg total) by mouth at bedtime as needed for sleep. 30 tablet 0  . carvedilol (COREG) 3.125 MG tablet Take 1 tablet (3.125 mg total) by mouth 2 (two) times daily. (Patient not taking: Reported on 03/30/2016) 180 tablet 3  . dexamethasone (DECADRON) 4 MG tablet Take 2 tablets (8 mg total) by mouth 2 (two) times daily. Start the day before Taxotere. Then again the day after chemo for 3 days. (Patient not taking: Reported on 04/26/2016) 30 tablet 1   No current facility-administered medications for this encounter.   Facility-Administered Medications Ordered in Other Encounters  Medication Dose Route Frequency Provider Last Rate Last Dose  . sodium chloride 0.9 % injection 10 mL  10 mL Intracatheter PRN Sandra Merle, MD   10 mL at 01/06/16 1730    Physical Findings:  height is 5' 6"  (1.676 m) and weight is 151 lb 3.2 oz (68.584 kg). Her temperature is 98.4 F (36.9 C). Her blood pressure is 98/67 and her pulse is 122.   In general this is a well appearing African American female in no acute distress. She is alert and oriented x4 and appropriate throughout the examination. HEENT reveals that the patient is normocephalic, atraumatic. EOMs are intact. PERRLA. Skin is intact without any evidence of gross lesions. Cardiovascular exam reveals a regular rate and rhythm, no clicks rubs or murmurs are auscultated. Chest is  clear to auscultation bilaterally. Lymphatic assessment is performed and does not reveal any adenopathy in the cervical, supraclavicular, axillary, or inguinal chains. Abdomen has active bowel sounds in all quadrants and is intact. The abdomen is soft, non tender, non distended. Lower extremities are negative for pretibial pitting edema, deep calf tenderness, cyanosis or clubbing. Bilateral mastectomy sites with tissue expanders are examined. There is some palpable fullness in bilateral axillae consistent with early lymphedema. No palpable masses are appreciated. No separation of mastectomy scars are noted bilaterally. Her right upper extremity is measured, and the right biceps region measures 30 cm whereas the left measures 29 cm.   Lab Findings: Lab Results  Component Value Date   WBC 6.0 04/20/2016   HGB 11.1* 04/20/2016   HCT 33.7* 04/20/2016   MCV 94.1 04/20/2016   PLT 177 04/20/2016     Radiographic Findings: No results found.  Impression/Plan: 1. I spoke to the patient today regarding her diagnosis and options for treatment. The patient has node positive disease and given her age, I would recommend postmastectomy radiation treatment. We therefore discussed this in some detail including the possible side effects and risks of treatment as well as the benefit. We discussed the process of simulation and the placement tattoos. We discussed 6 1/2 weeks of treatment as an outpatient.  We discussed the low likelihood of secondary malignancies. We discussed the possible side effects including but not limited to skin redness, fatigue, lymphedema, and breast swelling. CT simulation will be scheduled for early June.    This document serves as a record of services personally performed by Shona Simpson, PA and Kyung Rudd, MD. It was created on their behalf by Jenell Milliner, a trained medical scribe. The creation of this record is based on the scribe's personal observations and the provider's statements  to them. This document has been checked and approved by the attending provider.

## 2016-04-26 NOTE — Telephone Encounter (Addendum)
Called and left message on Dr. Ernestina Penna nurses'line to relay that Ms. Twardowski continues to have orthostatic Blood pressures. She reports that she received IV hydration yesterday.

## 2016-04-27 ENCOUNTER — Telehealth: Payer: Self-pay | Admitting: *Deleted

## 2016-04-27 NOTE — Telephone Encounter (Signed)
CALLED PATIENT TO INFORM OF PT APPT. FOR 05-01-16 @ 3:15 PM @ [facility], SPOKE WITH PATIENT AND SHE IS AWARE OF THIS APPT.

## 2016-04-28 ENCOUNTER — Encounter: Payer: Self-pay | Admitting: Hematology

## 2016-04-28 ENCOUNTER — Encounter: Payer: Self-pay | Admitting: Radiation Oncology

## 2016-04-28 ENCOUNTER — Ambulatory Visit (HOSPITAL_COMMUNITY)
Admission: RE | Admit: 2016-04-28 | Discharge: 2016-04-28 | Disposition: A | Discharge status: Home / Self Care | Payer: BLUE CROSS/BLUE SHIELD | Source: Ambulatory Visit | Attending: Internal Medicine | Admitting: Internal Medicine

## 2016-04-28 ENCOUNTER — Encounter (HOSPITAL_COMMUNITY): Payer: Self-pay | Admitting: Internal Medicine

## 2016-04-28 VITALS — BP 102/72 | HR 98 | Wt 149.8 lb

## 2016-04-28 DIAGNOSIS — Z823 Family history of stroke: Secondary | ICD-10-CM | POA: Diagnosis not present

## 2016-04-28 DIAGNOSIS — I427 Cardiomyopathy due to drug and external agent: Secondary | ICD-10-CM

## 2016-04-28 DIAGNOSIS — Z803 Family history of malignant neoplasm of breast: Secondary | ICD-10-CM | POA: Insufficient documentation

## 2016-04-28 DIAGNOSIS — Z8249 Family history of ischemic heart disease and other diseases of the circulatory system: Secondary | ICD-10-CM | POA: Insufficient documentation

## 2016-04-28 DIAGNOSIS — K219 Gastro-esophageal reflux disease without esophagitis: Secondary | ICD-10-CM | POA: Insufficient documentation

## 2016-04-28 DIAGNOSIS — C50411 Malignant neoplasm of upper-outer quadrant of right female breast: Secondary | ICD-10-CM | POA: Diagnosis not present

## 2016-04-28 DIAGNOSIS — Z79899 Other long term (current) drug therapy: Secondary | ICD-10-CM | POA: Insufficient documentation

## 2016-04-28 DIAGNOSIS — E119 Type 2 diabetes mellitus without complications: Secondary | ICD-10-CM | POA: Insufficient documentation

## 2016-04-28 DIAGNOSIS — Z888 Allergy status to other drugs, medicaments and biological substances status: Secondary | ICD-10-CM | POA: Diagnosis not present

## 2016-04-28 DIAGNOSIS — T451X5A Adverse effect of antineoplastic and immunosuppressive drugs, initial encounter: Secondary | ICD-10-CM | POA: Diagnosis not present

## 2016-04-28 DIAGNOSIS — Z9013 Acquired absence of bilateral breasts and nipples: Secondary | ICD-10-CM | POA: Insufficient documentation

## 2016-04-28 DIAGNOSIS — Z885 Allergy status to narcotic agent status: Secondary | ICD-10-CM | POA: Diagnosis not present

## 2016-04-28 DIAGNOSIS — Z170 Estrogen receptor positive status [ER+]: Secondary | ICD-10-CM | POA: Diagnosis not present

## 2016-04-28 MED ORDER — LOSARTAN POTASSIUM 25 MG PO TABS: 12.5000 mg | ORAL_TABLET | Freq: Every day | ORAL | Status: DC

## 2016-04-28 MED FILL — LOSARTAN POTASSIUM 25 MG TA: 25 | 90 days supply | Qty: 45 | Fill #0

## 2016-04-28 NOTE — Patient Instructions (Signed)
START Losartan 12.5 mg, one half tab at bedtime  Your physician has requested that you have an echocardiogram. Echocardiography is a painless test that uses sound waves to create images of your heart. It provides your doctor with information about the size and shape of your heart and how well your heart's chambers and valves are working. This procedure takes approximately one hour. There are no restrictions for this procedure.  Your physician recommends that you schedule a follow-up appointment in: 2 weeks with Dr Haroldine Laws

## 2016-04-28 NOTE — Progress Notes (Signed)
Patient ID: Sandra James, female   DOB: 23-Jul-1966, 50 y.o.   MRN: [id number]     CARDIO-ONCOLOGY CLINIC NOTE  Referring Physician: Burr Medico Primary Care: Kristie Cowman Primary Cardiologist:  HPI:  Sandra James is a 50 y.o. female with h/o DM2 and GERD who was diagnosed with right breast invasive ductal carcinoma pT1cN1aM0, stage IIB, grade 2, ER+/PR+, HER2+ with + 1 out of 3 + sentinel lymph nodes. Discovered on screening mammogram 09/07/15. She is now s/p bilateral mastectomy 11/30/15. She is referred to toe cardio-oncology clinic by Dr. Burr Medico  Of note, she had a left breast lumpectomy in 2004 and was treated at that time with Adriamycin.    Oncology History   Breast cancer of upper-outer quadrant of right female breast [facility])  Staging form: Breast, AJCC 7th Edition  Clinical stage from 09/15/2015: Stage IA (T1b, N0, M0) - Unsigned       Breast cancer of upper-outer quadrant of right female breast ([place])   09/07/2015 Mammogram Diagnostic mammogram and the ultrasound of the right breast showed a 0.8 cm lobulated mass in the right breast 11 to 12:00 position 9 cm from the nipple. No other lesions or adenopathy.   09/08/2015 Initial Diagnosis Breast cancer of upper-outer quadrant of right female breast ([place])   09/08/2015 Initial Biopsy Right breast mass at the upper outer quadrant core needle biopsy showed invasive ductal carcinoma, grade 2-3, ductal carcinoma in situ.   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 Left breast ostectomy was negative for malignancy.       At last visit echo notable for very mild cardiotoxicity.  Herceptin stopped after 4/6 cycles. Started carvedilol but didn't tolerate due to fatigue. Now feels better. No SOB or swelling. Feels that her HR is high.   Scheduled for 1 year of herceptin. (started 01/07/16)   ECHO 12/21/15 55-60% Lateral s' 11.0 GLS -18.5% no MVP ECHO 03/21/16 50-55% lateral s' 9.2 cm/s GLS -16.1%   Past Medical History  Diagnosis Date  . MVP (mitral valve prolapse)   . Infection     UTI  . Anxiety   . Fibroid   . Cancer [facility])     left brast lumpectomy   . Breast cancer of upper-outer quadrant of right female breast ([place]) 09/09/2015  . Diabetes mellitus without complication ([place])   . Depression   . GERD (gastroesophageal reflux disease)   . Complication of anesthesia     slow to wake up- BP was low, fainted next day-  . Breast cancer ([place]) 09/07/15    right breast  . Allergy   . UTI (lower urinary tract infection)   . Neuromuscular disorder ([place])     sciatic nerve paion left side/leg    Current Outpatient Prescriptions  Medication Sig Dispense Refill  . carvedilol (COREG) 3.125 MG tablet Take 1 tablet (3.125 mg total) by mouth 2 (two) times daily. 180 tablet 3  . dexamethasone (DECADRON) 4 MG tablet Take 2 tablets (8 mg total) by mouth 2 (two) times daily. Start the day before Taxotere. Then again the day after chemo for 3 days. 30 tablet 1  . diazepam (VALIUM) 2 MG tablet   0  .  fluticasone (FLONASE) 50 MCG/ACT nasal spray Place 1 spray into both nostrils daily as needed for allergies.   0  . ibuprofen (ADVIL,MOTRIN) 800 MG tablet Take 800 mg by mouth daily as needed for headache or moderate pain.    Marland Kitchen lidocaine-prilocaine (EMLA) cream Apply to affected area once 30 g 3  . magic mouthwash SOLN Take 5 mLs by mouth 4 (four) times daily as needed for mouth pain. 320 mL 1  . mirtazapine (REMERON) 30 MG tablet Reported on 03/30/2016  3  . ondansetron (ZOFRAN) 8 MG tablet Take 1 tablet (8 mg total) by mouth 2 (two) times daily. Start the day after chemo for 3 days.  Then take as needed for nausea or vomiting. 30 tablet 1  . polyethylene glycol (MIRALAX / GLYCOLAX) packet Take 17 g by mouth daily as needed for mild constipation. 14 each 0  . potassium chloride SA (K-DUR,KLOR-CON) 20 MEQ tablet Take 1 tablet (20 mEq total) by mouth daily. 30 tablet 1  . prochlorperazine (COMPAZINE) 10 MG tablet Take 1 tablet (10 mg total) by mouth every 6 (six) hours as needed (Nausea or vomiting). 30 tablet 1  . zolpidem (AMBIEN) 10 MG tablet Take 1 tablet (10 mg total) by mouth at bedtime as needed for sleep. 30 tablet 0   No current facility-administered medications for this encounter.   Facility-Administered Medications Ordered in Other Encounters  Medication Dose Route Frequency Provider Last Rate Last Dose  . sodium chloride 0.9 % injection 10 mL  10 mL Intracatheter PRN Truitt Merle, MD   10 mL at 01/06/16 1730    Allergies  Allergen Reactions  . Lisinopril Cough  . Oxycodone Itching  . Percocet [Oxycodone-Acetaminophen] Itching      Social History   Social History  . Marital Status: Single    Spouse Name: N/A  . Number of Children: N/A  . Years of Education: N/A   Occupational History  . Not on file.   Social History Main Topics  . Smoking status: Never Smoker   . Smokeless tobacco: Never Used     Comment: previous secondhand smoke exposure  . Alcohol Use: 2.4 oz/week    4 Glasses of wine per week     Comment: 1 bottle of wine per wk  . Drug Use: No  . Sexual Activity: Yes    Birth Control/ Protection: Surgical   Other Topics Concern  . Not on file   Social History Narrative      Family History  Problem Relation Age of Onset  . Hypertension Mother   . Stroke Mother   . Alzheimer's disease Mother   . Heart disease Father   . Stroke Father   . Heart attack Father   . Breast cancer Sister 76    double mastectomy  . Other Sister     "stomach tumor that wrapped around reproductive organs"; dx. 67s; required partial hysterectomy  .  Alzheimer's disease Maternal Grandmother   . Alzheimer's disease Paternal Grandmother   . Other Other     had to have lymph nodes removed and radiation; dx. 10s  . Cancer Maternal Aunt     unspecified type; dx. <50  . Colon cancer Maternal Uncle     dx. 66s  . Breast cancer Cousin     dx. 50s    Filed Vitals:   04/28/16 1351  BP: 102/72  Pulse: 98  Weight: 149 lb 12 oz (67.926 kg)  SpO2: 99%    PHYSICAL EXAM: General:  Well appearing. No respiratory difficulty HEENT: normal Neck: supple. no JVD. Carotids 2+ bilat; no bruits. No lymphadenopathy or thryomegaly appreciated. Cor: s/p bilateral mastectomies. PMI nondisplaced. Regular rate & rhythm. No rubs, gallops or murmurs. Lungs: clear Abdomen: soft, nontender, nondistended. No hepatosplenomegaly. No bruits or masses. Good bowel sounds. Extremities: no cyanosis, clubbing, rash, edema Neuro: alert & oriented x 3, cranial nerves grossly intact. moves all 4 extremities w/o difficulty. Affect pleasant.   ASSESSMENT & PLAN:   1. Breast cancer - Right breast invasive ductal carcinoma, pT1cN1aM0, stage IIB, grade 2, ER+/PR+/HER2+  - Now s/p bilateral mastectomy - Has a history of stage 1 L breast CA s/p lumpectomy ( now as above, s/p B mastectomy) and was treated at that time with Adriamycin.  - now s/p 4/6 cycles of adjuvant chemo + herceptin/perjeta  2. 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  - discussed with Dr. Burr Medico. Holding herceptin/perjeta. Can continue docetaxel, carboplatin.  - given previous adriamycin therapy in 2004 risk of cardiotoxicity~30% - unable to tolerate low-dose carvedilol due to fatigue - Try losartan 12.5 qhs as tolerated. - Repeat echo 5/30. If EF improved can restart herceptin/perjeta with close f/u. May need several scheduled interruptions to try and complete 1 year of biological therapy.    Aniylah Avans,MD 2:02 PM

## 2016-04-28 NOTE — Progress Notes (Signed)
Fax sent 02/16/16 I sent to medical records

## 2016-05-01 ENCOUNTER — Ambulatory Visit: Payer: BLUE CROSS/BLUE SHIELD | Attending: Radiation Oncology | Admitting: Physical Therapy

## 2016-05-01 ENCOUNTER — Encounter: Payer: Self-pay | Admitting: Physical Therapy

## 2016-05-01 DIAGNOSIS — I972 Postmastectomy lymphedema syndrome: Secondary | ICD-10-CM | POA: Insufficient documentation

## 2016-05-01 NOTE — Therapy (Signed)
[place], [place], [address] Phone: [phone]   Fax:  [phone]  Physical Therapy Evaluation  Patient Details  Name: Sandra James MRN: [id number] Date of Birth: 06/24/1966 Referring Provider: Dara Lords  Encounter Date: 05/01/2016      PT End of Session - 05/01/16 1701    Visit Number 1   Number of Visits 9   Date for PT Re-Evaluation 05/29/16   PT Start Time 1245   PT Stop Time 1558   PT Time Calculation (min) 42 min   Activity Tolerance Patient tolerated treatment well   Behavior During Therapy [facility] for tasks assessed/performed      Past Medical History  Diagnosis Date  . MVP (mitral valve prolapse)   . Infection     UTI  . Anxiety   . Fibroid   . Cancer [facility])     left brast lumpectomy   . Breast cancer of upper-outer quadrant of right female breast ([place]) 09/09/2015  . Diabetes mellitus without complication ([place])   . Depression   . GERD (gastroesophageal reflux disease)   . Complication of anesthesia     slow to wake up- BP was low, fainted next day-  . Breast cancer ([place]) 09/07/15    right breast  . Allergy   . UTI (lower urinary tract infection)   . Neuromuscular disorder ([place])     sciatic nerve paion left side/leg    Past Surgical History  Procedure Laterality Date  . Abdominal hysterectomy    . Breast surgery  2004    left lumpectomy  . Oophorectomy Bilateral   . Mastectomy w/ sentinel node biopsy Bilateral 11/25/2015    Procedure: BILATERAL SKIN SPARING MASTECTOMIES WITH RIGHT SENTINEL LYMPH NODE BIOPSY;  Surgeon: Stark Klein, MD;  Location: [place];  Service: General;  Laterality: Bilateral;  . Breast reconstruction with placement of tissue expander and flex hd (acellular hydrated dermis) Bilateral 11/25/2015    Procedure: BILATERAL IMMEDIATE BREAST RECONSTRUCTION WITH PLACEMENT OF TISSUE EXPANDERS AND FLEX HD (ACELLULAR HYDRATED DERMIS);  Surgeon:  Wallace Going, DO;  Location: [place];  Service: Plastics;  Laterality: Bilateral;  . Portacath placement Left 12/20/2015    Procedure: INSERTION PORT-A-CATH, left chest;  Surgeon: Stark Klein, MD;  Location: [place];  Service: General;  Laterality: Left;    There were no vitals filed for this visit.       Subjective Assessment - 05/01/16 1518    Subjective Pt states her arm has been swelling on and off in the right axilla. She states it recently began feeling very heavy and has been uncomfortable. It woke her up because it was hurting.    Pertinent History Sandra James is a 50 y.o. female with h/o DM2 and GERD who was diagnosed with right breast invasive ductal carcinoma [id number], stage IIB, grade 2, ER+/PR+, HER2+ with + 1 out of 3 + sentinel lymph nodes. Discovered on screening mammogram 09/07/15. She is now s/p bilateral mastectomy 11/30/15, hx of left breast cancer in 2004 with lumpectomy, pt to undergo radiation, has completed chemo already and will begin hormone pill    Patient Stated Goals to get the swelling down   Currently in Pain? No/denies   Pain Score 0-No pain            [facility] PT Assessment - 05/01/16 0001    Assessment   Medical Diagnosis left and right breast cancer  left in 2004, right 2016  Referring Provider Dara Lords   Onset Date/Surgical Date 11/25/15   Hand Dominance Right   Prior Therapy ROM therapy here around Jan or Feb   Precautions   Precautions Other (comment)  lymphedema   Restrictions   Weight Bearing Restrictions No   Balance Screen   Has the patient fallen in the past 6 months No   Has the patient had a decrease in activity level because of a fear of falling?  No   Is the patient reluctant to leave their home because of a fear of falling?  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, desk job   Leisure pt states she was but she is not currently   Cognition   Overall Cognitive Status Within Functional Limits for tasks assessed   AROM   Overall AROM Comments ROM is within functional limits   Strength   Overall Strength Within functional limits for tasks performed           LYMPHEDEMA/ONCOLOGY QUESTIONNAIRE - 05/01/16 1528    Type   Cancer Type Right breast cancer and left breast cancer   Surgeries   Mastectomy Date 11/25/15  bilateral   Saline Implant Reconstruction Date --  expanders in   Sentinel Lymph Node Biopsy Date 11/25/15   Axillary Lymph Node Dissection Date 05/12/03   Other Surgery Date 05/12/03  left lumpectomy   Number Lymph Nodes Removed 3  on the right, 10 on L   Date Lymphedema/Swelling Started   Date 01/12/16   Treatment   Active Chemotherapy Treatment No   Past Chemotherapy Treatment Yes   Date 04/20/16   Active Radiation Treatment No  pt to begin in June   Past Radiation Treatment Yes   Date 12/12/03  pt does not remember   Body Site left breast   Current Hormone Treatment No  pt to begin soon   Past Hormone Therapy Yes   Date 12/12/07  approx   Drug Name Tamoxifen   What other symptoms do you have   Are you Having Heaviness or Tightness Yes   Are you having Pain Yes   Are you having pitting edema No   Is it Hard or Difficult finding clothes that fit No   Do you have infections No   Is there Decreased scar mobility No   Lymphedema Assessments   Lymphedema Assessments Upper extremities   Right Upper Extremity Lymphedema   15 cm Proximal to Olecranon Process 29.2 cm   Olecranon Process 24.8 cm   15 cm Proximal to Ulnar Styloid Process 23 cm   Just Proximal to Ulnar Styloid Process  16 cm   Across Hand at [employer] 19 cm   At [place] of 2nd Digit 6.8 cm   Left Upper Extremity Lymphedema   15 cm Proximal to Olecranon Process 28.5 cm   Olecranon Process 24 cm   15 cm Proximal to Ulnar Styloid Process 21.5 cm   Just Proximal to Ulnar Styloid Process 15.8 cm   Across Hand at [employer] 19.5 cm   At [place] of 2nd Digit 6.5 cm  Katina Dung - 05/01/16 0001    Open a tight or new jar No difficulty   Do heavy household chores (wash walls, wash floors) No difficulty   Carry a shopping bag or briefcase No difficulty   Wash your back No difficulty   Use a knife to cut food No difficulty   Recreational activities in which you take some force or impact through your arm, shoulder, or hand (golf, hammering, tennis) No difficulty   During the past week, to what extent has your arm, shoulder or hand problem interfered with your normal social activities with family, friends, neighbors, or groups? Slightly   During the past week, to what extent has your arm, shoulder or hand problem limited your work or other regular daily activities Not at all   Arm, shoulder, or hand pain. Moderate   Tingling (pins and needles) in your arm, shoulder, or hand Moderate   Difficulty Sleeping Mild difficulty   DASH Score 13.64 %             [facility] Adult PT Treatment/Exercise - 05/01/16 0001    Manual Therapy   Manual Therapy Edema management   Manual therapy comments cut 1/2 grey foam and placed in TG soft for pt to wear in bra against right truncal edema, also cut a long piece of TG soft for pt to wear on RUE to management of edema and to increase comfort                PT Education - 05/01/16 1559    Education provided Yes   Education Details wear grey foam in bra to reduce right trunk edema, wear TG soft on RUE to increase comfort and control edema, lymphedema risk reduction practices   Person(s) Educated Patient   Methods Explanation;Demonstration;Verbal cues    Comprehension Verbalized understanding                [facility] Goals - 05/01/16 1708    CC Long Term Goal  #1   Title Patient will report at least 50% perceived decrease in swelling and discomfort at area of right axilla   Time 4   Period Weeks   Status New   CC Long Term Goal  #2   Title Pt will be independent in stating lymphedema risk reduction practices   Time 4   Period Weeks   Status New   CC Long Term Goal  #3   Title Pt will obtain appropriate compression garments for long term management of edema   Time 4   Period Weeks   Status New   CC Long Term Goal  #4   Title Pt will be independent with self manual lymphatic drainage for long term management of edema   Time 4   Period Weeks   Status New            Plan - 05/01/16 1702    Clinical Impression Statement Pt with history of left breast cancer in 2004 and left lumpectomy with recent diagnosis of right breast cancer in Oct 2016 followed by bilateral mastectomy in December 2016. Pt to undergo radiation soon and has completed chemotherapy. She reports to PT today stating she is having swelling in her RUE and R lateral trunk and axilla. Pt states recently it has been causing her discomfort. At this point there is not a noticeable difference in circumferential measurements between right and left sides. Pt's presentation is similar to stage 1 lymphedema. She would benefit from skilled  PT services to teach MLD and for obtaining compression garments for management of edema.    Rehab Potential Excellent   Clinical Impairments Affecting Rehab Potential pt to begin radiation   PT Frequency 2x / week   PT Duration 4 weeks   PT Treatment/Interventions Passive range of motion;Manual lymph drainage;Compression bandaging;ADLs/Self Care Home Management;Manual techniques;Orthotic Fit/Training   PT Next Visit Plan begin teaching self MLD, assess how foam worked in bra and TG soft on RUE   Recommended Other [place] for measurement of compression sleeve - sent facesheet   Consulted and Agree with Plan of Care Patient      Patient will benefit from skilled therapeutic intervention in order to improve the following deficits and impairments:  Decreased knowledge of precautions, Increased fascial restricitons, Pain, Increased edema  Visit Diagnosis: Postmastectomy lymphedema - Plan: PT plan of care cert/re-cert     Problem List Patient Active Problem List   Diagnosis Date Noted  . Hypersensitivity reaction 04/03/2016  . 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 DM type 2 (diabetes mellitus, type 2) ([place]) 02/17/2016  . Dermatitis 02/11/2016  . Diarrhea 02/02/2016  . Dehydration 02/02/2016  . Hypokalemia 02/02/2016  . Syncope 02/02/2016  . Genetic testing 10/08/2015  . History of left breast cancer 09/28/2015  . Family history of breast cancer in sister 09/28/2015  . Family history of colon cancer 09/28/2015  . Breast cancer of upper-outer quadrant of right female breast ([place]) 09/09/2015    Alexia Freestone 05/01/2016, 5:15 PM  [place], [place], [address] Phone: [phone]   Fax:  [phone]  Name: Reena Borromeo MRN: [id number] Date of Birth: 01-May-1966   Allyson Sabal, PT 05/01/2016 5:15 PM

## 2016-05-02 ENCOUNTER — Ambulatory Visit: Payer: BLUE CROSS/BLUE SHIELD

## 2016-05-05 ENCOUNTER — Ambulatory Visit: Payer: BLUE CROSS/BLUE SHIELD | Admitting: Physical Therapy

## 2016-05-05 DIAGNOSIS — I972 Postmastectomy lymphedema syndrome: Secondary | ICD-10-CM | POA: Diagnosis not present

## 2016-05-05 NOTE — Therapy (Signed)
[place], [place], [address] Phone: [phone]   Fax:  [phone]  Physical Therapy Treatment  Patient Details  Name: Sandra Sandra James MRN: [id number] Date of Birth: 04-09-1966 Referring Provider: Dara Sandra James  Encounter Date: 05/05/2016      PT End of Session - 05/05/16 0906    Visit Number 2   Number of Visits 9   Date for PT Re-Evaluation 05/29/16   PT Start Time 0804   PT Stop Time 0846   PT Time Calculation (min) 42 min   Activity Tolerance Patient tolerated treatment well   Behavior During Therapy [facility] for tasks assessed/performed      Past Medical History  Diagnosis Date  . MVP (mitral valve prolapse)   . Infection     UTI  . Anxiety   . Fibroid   . Cancer [facility])     left brast lumpectomy   . Breast cancer of upper-outer quadrant of right female breast ([place]) 09/09/2015  . Diabetes mellitus without complication ([place])   . Depression   . GERD (gastroesophageal reflux disease)   . Complication of anesthesia     slow to wake up- BP was low, fainted next day-  . Breast cancer ([place]) 09/07/15    right breast  . Allergy   . UTI (lower urinary tract infection)   . Neuromuscular disorder ([place])     sciatic nerve paion left side/leg    Past Surgical History  Procedure Laterality Date  . Abdominal hysterectomy    . Breast surgery  2004    left lumpectomy  . Oophorectomy Bilateral   . Mastectomy w/ sentinel node biopsy Bilateral 11/25/2015    Procedure: BILATERAL SKIN SPARING MASTECTOMIES WITH RIGHT SENTINEL LYMPH NODE BIOPSY;  Surgeon: Sandra Klein, MD;  Location: [place];  Service: General;  Laterality: Bilateral;  . Breast reconstruction with placement of tissue expander and flex hd (acellular hydrated dermis) Bilateral 11/25/2015    Procedure: BILATERAL IMMEDIATE BREAST RECONSTRUCTION WITH PLACEMENT OF TISSUE EXPANDERS AND FLEX HD (ACELLULAR HYDRATED DERMIS);  Surgeon: Sandra Going, DO;  Location: [place];  Service: Plastics;  Laterality: Bilateral;  . Portacath placement Left 12/20/2015    Procedure: INSERTION PORT-A-CATH, left chest;  Surgeon: Sandra Klein, MD;  Location: [place];  Service: General;  Laterality: Left;    There were no vitals filed for this visit.      Subjective Assessment - 05/05/16 0805    Subjective Nobody has contacted me about a compression sleeve.  TG soft sleeve and foam pad both worked to help her feel better.   Currently in Pain? Yes   Pain Score 2    Pain Location Axilla   Pain Orientation Right   Pain Descriptors / Indicators Other (Comment)  just sensitive   Aggravating Factors  nothing   Pain Relieving Factors TG soft sleeve and foam pad in bra                         [facility] Adult PT Treatment/Exercise - 05/05/16 0001    Self-Care   Self-Care Other Self-Care Comments   Other Self-Care Comments  Instructed in diaphragmatic breathing.  Began instructing patient in principles and techniques of manual lymph drainage with verbal instruction and demonstration; also loaned patient DVD on this, but asked her not to perform it until we have had her try it in clinic.   Manual Therapy   Manual Therapy Edema  management;Manual Lymphatic Drainage (MLD)   Manual Lymphatic Drainage (MLD) In supine, short neck, superficial and deep abdomen, right groin and axillo-inguinal anastomosis, and right UE from fingers to shoulder.                PT Education - 05/05/16 0906    Education provided Yes   Education Details began instruction in self-manual lymph drainage   Person(s) Educated Patient   Methods Explanation;Demonstration   Comprehension Need further instruction                [facility] Goals - 05/01/16 1708    CC Long Term Goal  #1   Title Patient will report at least 50% perceived decrease in swelling and discomfort at area of right axilla   Time 4   Period Weeks    Status New   CC Long Term Goal  #2   Title Pt will be independent in stating lymphedema risk reduction practices   Time 4   Period Weeks   Status New   CC Long Term Goal  #3   Title Pt will obtain appropriate compression garments for long term management of edema   Time 4   Period Weeks   Status New   CC Long Term Goal  #4   Title Pt will be independent with self manual lymphatic drainage for long term management of edema   Time 4   Period Weeks   Status New            Plan - 05/05/16 0906    Clinical Impression Statement Patient seemed concerned that she had not heard from vendor yet about fitting for compression sleeve, and wants that taken care of.  Did well with verbal instruction in self-manual lymph drainage today.   Rehab Potential Excellent   Clinical Impairments Affecting Rehab Potential pt to begin radiation   PT Frequency 2x / Sandra James   PT Duration 4 weeks   PT Treatment/Interventions ADLs/Self Care Home Management;Patient/family education;Manual lymph drainage   PT Next Visit Plan Have patient perform self-manual lymph drainage; continue doing that and education   Consulted and Agree with Plan of Care Patient      Patient will benefit from skilled therapeutic intervention in order to improve the following deficits and impairments:  Decreased knowledge of precautions, Increased fascial restricitons, Pain, Increased edema  Visit Diagnosis: Postmastectomy lymphedema     Problem List Patient Active Problem List   Diagnosis Date Noted  . Hypersensitivity reaction 04/03/2016  . 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 DM type 2 (diabetes mellitus, type 2) ([place]) 02/17/2016  . Dermatitis 02/11/2016  . Diarrhea 02/02/2016  . Dehydration 02/02/2016  . Hypokalemia 02/02/2016  . Syncope 02/02/2016  . Genetic testing 10/08/2015  . History of left breast cancer 09/28/2015  . Family history of breast cancer in sister 09/28/2015  . Family history of  colon cancer 09/28/2015  . Breast cancer of upper-outer quadrant of right female breast ([place]) 09/09/2015    [place],Sandra James 05/05/2016, 9:09 AM  [place], [place], [address] Phone: [phone]   Fax:  [phone]  Name: Sandra Sandra James MRN: [id number] Date of Birth: Feb 03, 1966    Sandra Royals, PT 05/05/2016 9:09 AM

## 2016-05-09 ENCOUNTER — Ambulatory Visit (HOSPITAL_COMMUNITY)
Admission: RE | Admit: 2016-05-09 | Discharge: 2016-05-09 | Disposition: A | Discharge status: Home / Self Care | Payer: BLUE CROSS/BLUE SHIELD | Source: Ambulatory Visit | Attending: Family Medicine | Admitting: Family Medicine

## 2016-05-09 ENCOUNTER — Encounter (HOSPITAL_COMMUNITY): Payer: Self-pay

## 2016-05-09 ENCOUNTER — Ambulatory Visit (HOSPITAL_BASED_OUTPATIENT_CLINIC_OR_DEPARTMENT_OTHER)
Admission: RE | Admit: 2016-05-09 | Discharge: 2016-05-09 | Disposition: A | Discharge status: Home / Self Care | Payer: BLUE CROSS/BLUE SHIELD | Source: Ambulatory Visit | Attending: Cardiology | Admitting: Cardiology

## 2016-05-09 VITALS — BP 110/70 | HR 104 | Wt 151.5 lb

## 2016-05-09 DIAGNOSIS — C50411 Malignant neoplasm of upper-outer quadrant of right female breast: Secondary | ICD-10-CM | POA: Diagnosis not present

## 2016-05-09 DIAGNOSIS — Z853 Personal history of malignant neoplasm of breast: Secondary | ICD-10-CM

## 2016-05-09 DIAGNOSIS — T451X5A Adverse effect of antineoplastic and immunosuppressive drugs, initial encounter: Secondary | ICD-10-CM

## 2016-05-09 DIAGNOSIS — I427 Cardiomyopathy due to drug and external agent: Secondary | ICD-10-CM | POA: Diagnosis not present

## 2016-05-09 DIAGNOSIS — E119 Type 2 diabetes mellitus without complications: Secondary | ICD-10-CM | POA: Diagnosis not present

## 2016-05-09 DIAGNOSIS — Z09 Encounter for follow-up examination after completed treatment for conditions other than malignant neoplasm: Secondary | ICD-10-CM | POA: Diagnosis present

## 2016-05-09 MED ORDER — BISOPROLOL FUMARATE 5 MG PO TABS: 2.5000 mg | ORAL_TABLET | Freq: Every day | ORAL | Status: DC

## 2016-05-09 MED FILL — BISOPROLOL FUMARATE 5 MG TA: 5 | 30 days supply | Qty: 15 | Fill #0

## 2016-05-09 NOTE — Progress Notes (Signed)
*  PRELIMINARY RESULTS* Echocardiogram 2D Echocardiogram has been performed.  Sandra James 05/09/2016, 12:02 PM

## 2016-05-09 NOTE — Patient Instructions (Signed)
Start Bisoprolol 2.5 mg at night  Your physician recommends that you schedule a follow-up appointment in: 3 weeks with echo

## 2016-05-09 NOTE — Progress Notes (Signed)
Patient ID: Sandra James, female   DOB: 1966/08/15, 50 y.o.   MRN: [id number]     [place] NOTE  Referring Physician: Burr Medico Primary Care: Kristie Cowman Primary Cardiologist: Dr. Haroldine Laws   HPI:  Sandra James is a 50 y.o. female with h/o DM2 and GERD who was diagnosed with right breast invasive ductal carcinoma [id number], stage IIB, grade 2, ER+/PR+, HER2+ with + 1 out of 3 + sentinel lymph nodes. Discovered on screening mammogram 09/07/15. She is now s/p bilateral mastectomy 11/30/15. She is referred to toe cardio-oncology clinic by Dr. Burr Medico  Of note, she had a left breast lumpectomy in 2004 and was treated at that time with Adriamycin.    Oncology History   Breast cancer of upper-outer quadrant of right female breast [facility])  Staging form: Breast, AJCC 7th Edition  Clinical stage from 09/15/2015: Stage IA (T1b, N0, M0) - Unsigned       Breast cancer of upper-outer quadrant of right female breast ([place])   09/07/2015 Mammogram Diagnostic mammogram and the ultrasound of the right breast showed a 0.8 cm lobulated mass in the right breast 11 to 12:00 position 9 cm from the nipple. No other lesions or adenopathy.   09/08/2015 Initial Diagnosis Breast cancer of upper-outer quadrant of right female breast ([place])   09/08/2015 Initial Biopsy Right breast mass at the upper outer quadrant core needle biopsy showed invasive ductal carcinoma, grade 2-3, ductal carcinoma in situ.   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 Left breast ostectomy was negative for malignancy.       She returns today for follow up.  Several  visits ago, echo notable for very mild cardiotoxicity. Herceptin stopped after 4/6 cycles. No further fatigue since coreg stopped. Denies SOB or swelling. Occasional tachypalpitations. Denies edema, Orthopnea, or PND. No CP.    Scheduled for 1 year of herceptin. (started 01/07/16)   ECHO 12/21/15 55-60% Lateral s' 11.0   cm/s     GLS -18.5% no MVP ECHO 03/21/16 50-55% Lateral s' 9.2     cm/s     GLS -16.1% ECHO 05/09/16 55%      Lateral s' 12.06 cm/s     GLS -14.4%   Past Medical History  Diagnosis Date  . MVP (mitral valve prolapse)   . Infection     UTI  . Anxiety   . Fibroid   . Cancer [facility])     left brast lumpectomy   . Breast cancer of upper-outer quadrant of right female breast ([place]) 09/09/2015  . Diabetes mellitus without complication ([place])   . Depression   . GERD (gastroesophageal reflux disease)   . Complication of anesthesia     slow to wake up- BP was low, fainted next day-  . Breast cancer ([place]) 09/07/15    right breast  . Allergy   . UTI (lower urinary tract infection)   . Neuromuscular disorder ([place])     sciatic nerve paion left side/leg    Current Outpatient Prescriptions  Medication Sig Dispense Refill  . fluticasone (FLONASE) 50 MCG/ACT nasal spray Place 1 spray into both nostrils daily as needed for allergies.   0  . ibuprofen (ADVIL,MOTRIN) 800 MG  tablet Take 800 mg by mouth daily as needed for headache or moderate pain.    Marland Kitchen lidocaine-prilocaine (EMLA) cream Apply to affected area once 30 g 3  . losartan (COZAAR) 25 MG tablet Take 0.5 tablets (12.5 mg total) by mouth daily. 90 tablet 3  . magic mouthwash SOLN Take 5 mLs by mouth 4 (four) times daily as needed for mouth pain. 320 mL 1  . mirtazapine (REMERON) 30 MG tablet Reported on 03/30/2016  3  . polyethylene glycol (MIRALAX / GLYCOLAX) packet Take 17 g by mouth daily as needed for mild constipation. 14 each 0  . potassium chloride SA (K-DUR,KLOR-CON) 20 MEQ tablet Take 1 tablet (20 mEq total) by mouth daily.  30 tablet 1  . prochlorperazine (COMPAZINE) 10 MG tablet Take 1 tablet (10 mg total) by mouth every 6 (six) hours as needed (Nausea or vomiting). 30 tablet 1  . zolpidem (AMBIEN) 10 MG tablet Take 10 mg by mouth at bedtime as needed for sleep.     No current facility-administered medications for this encounter.   Facility-Administered Medications Ordered in Other Encounters  Medication Dose Route Frequency Provider Last Rate Last Dose  . sodium chloride 0.9 % injection 10 mL  10 mL Intracatheter PRN Truitt Merle, MD   10 mL at 01/06/16 1730    Allergies  Allergen Reactions  . Lisinopril Cough  . Oxycodone Itching  . Percocet [Oxycodone-Acetaminophen] Itching      Social History   Social History  . Marital Status: Single    Spouse Name: N/A  . Number of Children: N/A  . Years of Education: N/A   Occupational History  . Not on file.   Social History Main Topics  . Smoking status: Never Smoker   . Smokeless tobacco: Never Used     Comment: previous secondhand smoke exposure  . Alcohol Use: 2.4 oz/week    4 Glasses of wine per week     Comment: 1 bottle of wine per wk  . Drug Use: No  . Sexual Activity: Yes    Birth Control/ Protection: Surgical   Other Topics Concern  . Not on file   Social History Narrative      Family History  Problem Relation Age of Onset  . Hypertension Mother   . Stroke Mother   . Alzheimer's disease Mother   . Heart disease Father   . Stroke Father   . Heart attack Father   . Breast cancer Sister 69    double mastectomy  . Other Sister     "stomach tumor that wrapped around reproductive organs"; dx. 73s; required partial hysterectomy  . Alzheimer's disease Maternal Grandmother   . Alzheimer's disease Paternal Grandmother   . Other Other     had to have lymph nodes removed and radiation; dx. 79s  . Cancer Maternal Aunt     unspecified type; dx. <50  . Colon cancer Maternal Uncle     dx. 52s  . Breast cancer Cousin     dx. [place]:   05/09/16 1228  BP: 110/70  Pulse: 104  Weight: 151 lb 8 oz (68.72 kg)  SpO2: 100%   Wt Readings from Last 3 Encounters:  05/09/16 151 lb 8 oz (68.72 kg)  04/28/16 149 lb 12 oz (67.926 kg)  04/26/16 151 lb 3.2 oz (68.584 kg)     PHYSICAL EXAM: General:  Well appearing. No respiratory difficulty HEENT: normal Neck: supple. no JVD. Carotids 2+ bilat; no  bruits. No thyromegaly or nodule noted.  Cor: s/p bilateral mastectomies. PMI nondisplaced. RRR. No M/G/R.  Lungs: CTAB, normal effort Abdomen: soft, NT, ND, no HSM. No bruits or masses. +BS  Extremities: no cyanosis, clubbing, rash, edema Neuro: alert & oriented x 3, cranial nerves grossly intact. moves all 4 extremities w/o difficulty. Affect pleasant.  ASSESSMENT & PLAN:  1. Breast cancer - Right breast invasive ductal carcinoma, pT1cN1aM0, stage IIB, grade 2, ER+/PR+/HER2+  - Now s/p bilateral mastectomy - Has a history of stage 1 L breast CA s/p lumpectomy ( now as above, s/p B mastectomy) and was treated at that time with Adriamycin.  - Now s/p 4/6 cycles of adjuvant chemo + herceptin/perjeta. Currently on hold.   2. 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  - Discussed with Dr. Burr Medico. Holding herceptin/perjeta. Can continue docetaxel, carboplatin.  - Given previous adriamycin therapy in 2004 risk of cardiotoxicity~30% - Unable to tolerate even low-dose carvedilol due to fatigue - Continue losartan 12.5 qhs as tolerated. - Echo today shows only marginal improvement. LVEF 55%   Lateral s' 12.06 cm/s  GLS -14.4% - If EF improved can restart herceptin/perjeta with close f/u. May need several scheduled interruptions to try and complete 1 year of biological therapy.   Shirley Friar, PA-C 12:29 PM  Patient seen with PA, agree with the above note.  She is currently off Herceptin and Perjeta with mild worsening of LV systolic function.  She is doing well  symptomatically.    I reviewed today's echo.  EF is about 55%, looks marginally better than prior echo.  Global longitudinal strain, however, is lower at -14.4%. The images from which strain was derived looked crisp, I suspect that the measurement was real.   I will have her start bisoprolol 2.5 mg daily in addition to losartan.  Hopefully she will tolerate this better.  I am going to have her return for a limited echo on bisoprolol and losartan in 3 wks. In the meantime, I would recommend that she stay off Herceptin.   Loralie Champagne 05/09/2016

## 2016-05-11 ENCOUNTER — Other Ambulatory Visit: Payer: BLUE CROSS/BLUE SHIELD

## 2016-05-11 ENCOUNTER — Ambulatory Visit (HOSPITAL_BASED_OUTPATIENT_CLINIC_OR_DEPARTMENT_OTHER): Payer: BLUE CROSS/BLUE SHIELD | Admitting: Hematology

## 2016-05-11 ENCOUNTER — Telehealth: Payer: Self-pay | Admitting: Hematology

## 2016-05-11 ENCOUNTER — Ambulatory Visit (HOSPITAL_BASED_OUTPATIENT_CLINIC_OR_DEPARTMENT_OTHER): Payer: BLUE CROSS/BLUE SHIELD

## 2016-05-11 ENCOUNTER — Telehealth: Payer: Self-pay | Admitting: *Deleted

## 2016-05-11 ENCOUNTER — Ambulatory Visit: Payer: BLUE CROSS/BLUE SHIELD

## 2016-05-11 ENCOUNTER — Encounter: Payer: Self-pay | Admitting: Hematology

## 2016-05-11 VITALS — BP 120/74 | HR 68 | Temp 98.4°F | Resp 18 | Ht 66.0 in | Wt 157.3 lb

## 2016-05-11 DIAGNOSIS — G4700 Insomnia, unspecified: Secondary | ICD-10-CM

## 2016-05-11 DIAGNOSIS — Z853 Personal history of malignant neoplasm of breast: Secondary | ICD-10-CM

## 2016-05-11 DIAGNOSIS — C50411 Malignant neoplasm of upper-outer quadrant of right female breast: Secondary | ICD-10-CM

## 2016-05-11 DIAGNOSIS — E119 Type 2 diabetes mellitus without complications: Secondary | ICD-10-CM | POA: Diagnosis not present

## 2016-05-11 LAB — COMPREHENSIVE METABOLIC PANEL
ALBUMIN: 3.6 g/dL (ref 3.5–5.0)
ALK PHOS: 83 U/L (ref 40–150)
ALT: 17 U/L (ref 0–55)
ANION GAP: 8 meq/L (ref 3–11)
AST: 15 U/L (ref 5–34)
BILIRUBIN TOTAL: 0.33 mg/dL (ref 0.20–1.20)
BUN: 6.2 mg/dL — ABNORMAL LOW (ref 7.0–26.0)
CO2: 26 mEq/L (ref 22–29)
Calcium: 8.8 mg/dL (ref 8.4–10.4)
Chloride: 106 mEq/L (ref 98–109)
Creatinine: 0.7 mg/dL (ref 0.6–1.1)
GLUCOSE: 130 mg/dL (ref 70–140)
Potassium: 3.3 mEq/L — ABNORMAL LOW (ref 3.5–5.1)
SODIUM: 140 meq/L (ref 136–145)
TOTAL PROTEIN: 6.7 g/dL (ref 6.4–8.3)

## 2016-05-11 LAB — CBC WITH DIFFERENTIAL/PLATELET
BASO%: 0.5 % (ref 0.0–2.0)
Basophils Absolute: 0 10*3/uL (ref 0.0–0.1)
EOS ABS: 0 10*3/uL (ref 0.0–0.5)
EOS%: 0.1 % (ref 0.0–7.0)
HEMATOCRIT: 31.8 % — AB (ref 34.8–46.6)
HEMOGLOBIN: 10.7 g/dL — AB (ref 11.6–15.9)
LYMPH%: 23.2 % (ref 14.0–49.7)
MCH: 32.8 pg (ref 25.1–34.0)
MCHC: 33.6 g/dL (ref 31.5–36.0)
MCV: 97.7 fL (ref 79.5–101.0)
MONO#: 0.7 10*3/uL (ref 0.1–0.9)
MONO%: 8.6 % (ref 0.0–14.0)
NEUT%: 67.6 % (ref 38.4–76.8)
NEUTROS ABS: 5.6 10*3/uL (ref 1.5–6.5)
PLATELETS: 203 10*3/uL (ref 145–400)
RBC: 3.26 10*6/uL — ABNORMAL LOW (ref 3.70–5.45)
RDW: 17.7 % — AB (ref 11.2–14.5)
WBC: 8.3 10*3/uL (ref 3.9–10.3)
lymph#: 1.9 10*3/uL (ref 0.9–3.3)

## 2016-05-11 MED ORDER — HEPARIN SOD (PORK) LOCK FLUSH 100 UNIT/ML IV SOLN
500.0000 [IU] | Freq: Once | INTRAVENOUS | Status: AC
  Administered 2016-05-11: 500 [IU] via INTRAVENOUS
  Filled 2016-05-11: qty 5

## 2016-05-11 MED ORDER — SODIUM CHLORIDE 0.9% FLUSH
10.0000 mL | INTRAVENOUS | Status: DC | PRN
  Administered 2016-05-11: 10 mL via INTRAVENOUS
  Filled 2016-05-11: qty 10

## 2016-05-11 MED ORDER — ZOLPIDEM TARTRATE 10 MG PO TABS: 10.0000 mg | ORAL_TABLET | Freq: Every evening | ORAL | Status: DC | PRN

## 2016-05-11 MED FILL — ZOLPIDEM TARTRATE 10 MG TAB: 10 | 30 days supply | Qty: 30 | Fill #0

## 2016-05-11 NOTE — Telephone Encounter (Signed)
Gave pt apt & avs °

## 2016-05-11 NOTE — Telephone Encounter (Signed)
Return call received from patient.  Shared collaborative nurse message about today's appointment and repeat Echo.    "I am having problems with my ey so I will be in today to see Dr. Burr Medico."

## 2016-05-11 NOTE — Telephone Encounter (Signed)
Called pt and left message on voice mail requesting a call back from pt.  Per Dr. Burr Medico, cardiologist recommended HOLDING Herceptin at present with recent Echo results.   If pt has no issues, no need to follow up with md today.  Appt can be rescheduled in 4 weeks after repeated Echo.

## 2016-05-14 ENCOUNTER — Encounter: Payer: Self-pay | Admitting: Hematology

## 2016-05-14 NOTE — Progress Notes (Signed)
[place]  [phone] Fax:([phone]) [phone]  Clinic follow Up Note   Patient Care Team: Kristie Cowman, MD as PCP - General (Family Medicine) Stark Klein, MD as Consulting Physician (General Surgery) Truitt Merle, MD as Consulting Physician (Hematology) Arloa Koh, MD as Consulting Physician (Radiation Oncology) Mauro Kaufmann, RN as Registered Nurse Rockwell Germany, RN as Registered Nurse Sylvan Cheese, NP as Nurse Practitioner (Nurse Practitioner) 05/14/2016  CHIEF COMPLAINTS:  Follow up right breast cancer  Oncology History   Breast cancer [facility])   Staging form: Breast, AJCC 7th Edition     Pathologic stage from 11/25/2015: Stage IIA (T1c, N1a, cM0) - Unsigned     Pathologic stage from 11/25/2015: Stage IIA (T1c, N1a, cM0) - Unsigned Breast cancer of upper-outer quadrant of right female breast [facility])   Staging form: Breast, AJCC 7th Edition     Clinical stage from 09/15/2015: Stage IA (T1b, N0, M0) - Signed by Truitt Merle, MD on 12/16/2015     Pathologic stage from 11/25/2015: Stage IIA (T1c, N1a, cM0) - Signed by Truitt Merle, MD on 12/16/2015         Breast cancer of upper-outer quadrant of right female breast ([place])   09/07/2015 Mammogram Diagnostic mammogram and the ultrasound of the right breast showed a 0.8 cm lobulated mass in the right breast 11 to 12:00 position 9 cm from the nipple. No other lesions or adenopathy.   09/08/2015 Initial Diagnosis Breast cancer of upper-outer quadrant of right female breast ([place])   09/08/2015 Initial Biopsy Right breast mass at the upper outer quadrant core needle biopsy showed invasive ductal carcinoma, grade 2-3, ductal carcinoma in situ.   09/08/2015 Receptors her2 ER 100% positive, PR 100% positive, Ki-67 60%, HER-2 positive with copy # 4.25 and ratio 2.58   11/25/2015 Surgery Right breast mastectomy and sentinel lymph node biopsy   11/25/2015 Pathology Results Right breast invasive ductal carcinoma, grade 2, 2.0  cm, DCIS, intermediate grade, (+) LVI, margins were negative, 1 out of 3 lymph nodes were negative. Left breast ostectomy was negative for malignancy.   01/06/2016 - 04/20/2016 Chemotherapy adjuvant chemotherapy TCH P, every 3 weeks, for a total of 6 cycles. Herceptin and Perjeta were held for last two cycles due to drop of her EF on echo     HISTORY OF PRESENTING ILLNESS:  Sandra James 50 y.o. female with past medical history of stage I left breast cancer, is here because of newly diagnosed right breast cancer. She presents to our multidisciplinary breast clinic by herself.  The right breast cancer was discovered by screening mammogram. She did not have any palpable mass, or any constitutional symptoms. She was diagnosed with stage I left breast cancer at age of 60, she had lumpectomy, radiation, and adjuvant chemotherapy and 5 years of tamoxifen. She was treated by Dr. Louann Sjogren in [place]. She moved to [facility] about year ago due to job change. She has been very compliant with annual screening mammogram.  She had hysterectomy and bilateral oophorectomy and sphincterectomy 5 years ago for heavy bleeding.. She has been having hot flashes since then, moderate, but manageable. She is single, lives alone, works for 2 to [place] has to go up children who live in [place].  CURRENT THERAPY: Adjuvant chemotherapy with docetaxel, carboplatin, Herceptin and pejeta (TCHP) every 3 weeks started on 01/06/2016,  Herceptin and Pejeta was held for cycle 5 and 6 due to study decreased EF. Will start maintenance Herceptin when her cardiomyopathy  recovers   Sandra James for follow-up. She was seen by cardiologist Dr. Haroldine Laws a few days ago, and repeated echo showed low normal EF 50-55%, so Dr. Haroldine Laws recommend to continue holding Herceptin, and added bisoprolol to losartan, repeat echo in 3 weeks. She denies any chest pain, or dyspnea. She has recovered well from chemo, no diarrhea  or nausea. Her main complain is watery eyes, especially left eye, and some thick discharge in the morning, no fever, red eyes, vision disturbance or fever.   MED ICAL HISTORY:  Past Medical History  Diagnosis Date  . MVP (mitral valve prolapse)   . Infection     UTI  . Anxiety   . Fibroid   . Cancer [facility])     left brast lumpectomy   . Breast cancer of upper-outer quadrant of right female breast ([place]) 09/09/2015  . Diabetes mellitus without complication ([place])   . Depression   . GERD (gastroesophageal reflux disease)   . Complication of anesthesia     slow to wake up- BP was low, fainted next day-  . Breast cancer ([place]) 09/07/15    right breast  . Allergy   . UTI (lower urinary tract infection)   . Neuromuscular disorder ([place])     sciatic nerve paion left side/leg    SURGICAL HISTORY: Past Surgical History  Procedure Laterality Date  . Abdominal hysterectomy    . Breast surgery  2004    left lumpectomy  . Oophorectomy Bilateral   . Mastectomy w/ sentinel node biopsy Bilateral 11/25/2015    Procedure: BILATERAL SKIN SPARING MASTECTOMIES WITH RIGHT SENTINEL LYMPH NODE BIOPSY;  Surgeon: Stark Klein, MD;  Location: [place];  Service: General;  Laterality: Bilateral;  . Breast reconstruction with placement of tissue expander and flex hd (acellular hydrated dermis) Bilateral 11/25/2015    Procedure: BILATERAL IMMEDIATE BREAST RECONSTRUCTION WITH PLACEMENT OF TISSUE EXPANDERS AND FLEX HD (ACELLULAR HYDRATED DERMIS);  Surgeon: Wallace Going, DO;  Location: [place];  Service: Plastics;  Laterality: Bilateral;  . Portacath placement Left 12/20/2015    Procedure: INSERTION PORT-A-CATH, left chest;  Surgeon: Stark Klein, MD;  Location: [place] OR;  Service: General;  Laterality: Left;    SOCIAL HISTORY: Social History   Social History  . Marital Status: Single     Spouse Name: N/A  . Number of Children: 2 children, 29 daughter and 56 yo son    .  Years of Education: N/A   Occupational History  . She is a Barista rep   Social History Main Topics  . Smoking status: Never Smoker   . Smokeless tobacco: Never Used  . Alcohol Use: Yes     Comment: 2-3/wk  . Drug Use: No  . Sexual Activity: Yes    Birth Control/ Protection: Surgical   Other Topics Concern  . Not on file   Social History Narrative    FAMILY HISTORY: Family History  Problem Relation Age of Onset  . Hypertension Mother   . Stroke Mother   . Alzheimer's disease Mother   . Heart disease Father   . Stroke Father   . Heart attack Father   . Breast cancer Sister 70    double mastectomy  . Other Sister     "stomach tumor that wrapped around reproductive organs"; dx. 11s; required partial hysterectomy  . Alzheimer's disease Maternal Grandmother   . Alzheimer's disease Paternal Grandmother   . Other Other     had to have  lymph nodes removed and radiation; dx. 77s  . Cancer Maternal Aunt     unspecified type; dx. <50  . Colon cancer Maternal Uncle     dx. 68s  . Breast cancer Cousin     dx. 28s    ALLERGIES:  is allergic to lisinopril; oxycodone; and percocet.  MEDICATIONS:  Current Outpatient Prescriptions  Medication Sig Dispense Refill  . fluticasone (FLONASE) 50 MCG/ACT nasal spray Place 1 spray into both nostrils daily as needed for allergies.   0  . ibuprofen (ADVIL,MOTRIN) 800 MG tablet Take 800 mg by mouth daily as needed for headache or moderate pain.    Marland Kitchen lidocaine-prilocaine (EMLA) cream Apply to affected area once 30 g 3  . losartan (COZAAR) 25 MG tablet Take 0.5 tablets (12.5 mg total) by mouth daily. 90 tablet 3  . magic mouthwash SOLN Take 5 mLs by mouth 4 (four) times daily as needed for mouth pain. 320 mL 1  . mirtazapine (REMERON) 30 MG tablet Reported on 03/30/2016  3  . polyethylene glycol (MIRALAX / GLYCOLAX) packet Take 17 g by mouth daily as needed for mild constipation. 14 each 0  . prochlorperazine (COMPAZINE) 10 MG tablet Take 1  tablet (10 mg total) by mouth every 6 (six) hours as needed (Nausea or vomiting). 30 tablet 1  . zolpidem (AMBIEN) 10 MG tablet Take 1 tablet (10 mg total) by mouth at bedtime as needed for sleep. 30 tablet 2  . bisoprolol (ZEBETA) 5 MG tablet Take 0.5 tablets (2.5 mg total) by mouth at bedtime. (Patient not taking: Reported on 05/11/2016) 15 tablet 3  . potassium chloride SA (K-DUR,KLOR-CON) 20 MEQ tablet Take 1 tablet (20 mEq total) by mouth daily. (Patient not taking: Reported on 05/11/2016) 30 tablet 1   No current facility-administered medications for this visit.   Facility-Administered Medications Ordered in Other Visits  Medication Dose Route Frequency Provider Last Rate Last Dose  . sodium chloride 0.9 % injection 10 mL  10 mL Intracatheter PRN Truitt Merle, MD   10 mL at 01/06/16 1730    REVIEW OF SYSTEMS:   Constitutional: Denies fevers, chills or abnormal night sweats Eyes: Denies blurriness of vision, double vision or watery eyes Ears, nose, mouth, throat, and face: Denies mucositis or sore throat Respiratory: Denies cough, dyspnea or wheezes Cardiovascular: Denies palpitation, chest discomfort or lower extremity swelling Gastrointestinal:  Denies nausea, heartburn or change in bowel habits Skin: Denies abnormal skin rashes Lymphatics: Denies new lymphadenopathy or easy bruising Neurological:Denies numbness, tingling or new weaknesses Behavioral/Psych: Mood is stable, no new changes  All other systems were reviewed with the patient and are negative.  PHYSICAL EXAMINATION: ECOG PERFORMANCE STATUS: 1  Filed Vitals:   05/11/16 1424  BP: 120/74  Pulse: 68  Temp: 98.4 F (36.9 C)  Resp: 18   Filed Weights   05/11/16 1424  Weight: 157 lb 4.8 oz (71.351 kg)    GENERAL:alert, no distress and comfortable SKIN: skin color, texture, turgor are normal, no rashes or significant lesions EYES: normal, conjunctiva are pink and non-injected, sclera clear, eye lids appears  normal OROPHARYNX:no exudate, no erythema and lips, buccal mucosa, and tongue normal  NECK: supple, thyroid normal size, non-tender, without nodularity LYMPH:  no palpable lymphadenopathy in the cervical, axillary or inguinal LUNGS: clear to auscultation and percussion with normal breathing effort HEART: regular rate & rhythm and no murmurs and no lower extremity edema ABDOMEN:abdomen soft, non-tender and normal bowel sounds Musculoskeletal:no cyanosis of digits and no clubbing  PSYCH: alert & oriented x 3 with fluent speech NEURO: no focal motor/sensory deficits Breasts: s/p bilateral mastectomy and tissue spender placement. Surgical incision sites are clean, no surrounding skin erythema or discharge.    LABORATORY DATA:  I have reviewed the data as listed CBC Latest Ref Rng 05/11/2016 04/20/2016 03/30/2016  WBC 3.9 - 10.3 10e3/uL 8.3 6.0 6.6  Hemoglobin 11.6 - 15.9 g/dL 10.7(L) 11.1(L) 11.4(L)  Hematocrit 34.8 - 46.6 % 31.8(L) 33.7(L) 34.1(L)  Platelets 145 - 400 10e3/uL 203 177 216    CMP Latest Ref Rng 05/11/2016 04/20/2016 03/30/2016  Glucose 70 - 140 mg/dl 130 89 96  BUN 7.0 - 26.0 mg/dL 6.2(L) 7.7 8.4  Creatinine 0.6 - 1.1 mg/dL 0.7 0.8 0.8  Sodium 136 - 145 mEq/L 140 142 141  Potassium 3.5 - 5.1 mEq/L 3.3(L) 3.6 3.6  Chloride 101 - 111 mmol/L - - -  CO2 22 - 29 mEq/L 26 28 27   Calcium 8.4 - 10.4 mg/dL 8.8 9.4 9.3  Total Protein 6.4 - 8.3 g/dL 6.7 7.1 7.0  Total Bilirubin 0.20 - 1.20 mg/dL 0.33 0.69 0.65  Alkaline Phos 40 - 150 U/L 83 70 73  AST 5 - 34 U/L 15 24 26   ALT 0 - 55 U/L 17 31 33     Pathology report Diagnosis 11/25/2015 1. Breast, simple mastectomy, right - INVASIVE DUCTAL CARCINOMA, GRADE 2/3, SPANNING 2.0 CM. - DUCTAL CARCINOMA IN SITU, INTERMEDIATE GRADE. - LYMPHOVASCULAR INVASION IS IDENTIFIED. - LOBULAR NEOPLASIA (ATYPICAL LOBULAR HYPERPLASIA). - THE SURGICAL RESECTION MARGINS ARE NEGATIVE FOR CARCINOMA. - SEE ONCOLOGY TABLE BELOW. 2. Lymph node,  sentinel, biopsy, right axillary #1 - METASTATIC CARCINOMA IN 1 OF 1 LYMPH NODE (1/1). 3. Lymph node, sentinel, biopsy, right axillary #2 - THERE IS NO EVIDENCE OF CARCINOMA IN 1 OF 1 LYMPH NODE (0/1). 4. Lymph node, sentinel, biopsy, right axillary #3 - THERE IS NO EVIDENCE OF CARCINOMA IN 1 OF 1 LYMPH NODE (0/1). 5. Breast, simple mastectomy, left - BENIGN BREAST PARENCHYMA WITH DENSE STROMAL FIBROSIS. - FIBROADENOMA. - THERE IS NO EVIDENCE OF MALIGNANCY. 6. Breast, excision, left, additional lateral margin - BENIGN FIBROADIPOSE TISSUE. - THERE IS NO EVIDENCE OF MALIGNANCY. - SEE COMMENT. Microscopic Comment 1. BREAST, INVASIVE TUMOR, WITH LYMPH NODES PRESENT Specimen, including laterality and lymph node sampling (sentinel, non-sentinel): Right breast and right axillary lymph nodes Procedure: Bilateral mastectomy and multiple right axillary lymph node resections Histologic type: Ductal Grade: 2 Tubule formation: 2 1 of 4 FINAL for Sandra James, Sandra James ([id number]) Microscopic Comment(continued) Nuclear pleomorphism: 2 Mitotic: 2 Tumor size (gross measurement): 2.0 cm Margins: Negative for carcinoma Invasive, distance to closest margin: 1.8 cm to the posterior margin In-situ, distance to closest margin: 1.8 cm to the posterior margin Lymphovascular invasion: Present Ductal carcinoma in situ: Present Grade: Intermediate grade Extensive intraductal component: Not identified Lobular neoplasia: Present, atypical lobular hyperplasia Tumor focality: Unifocal Treatment effect: Not identified Extent of tumor: Confined to breast parenchyma Lymph nodes: Examined: 3 Sentinel 0 Non-sentinel 3 Total Lymph nodes with metastasis: 1 (macrometastasis) - no extracapsular extension is identified. Breast prognostic profile: [phone] Estrogen receptor: 100%, strong staining intensity Progesterone receptor: 100%, strong staining intensity Her 2 neu: Amplification was detected. The ratio  was 2.58 Ki-67: 60% TNM: pT1c, pN1a Non-neoplastic breast: No significant findings. 6. The surgical resection margin(s) of the specimen were inked and microscopically evaluated. Enid Cutter MD Pathologist, Electronic Signature (Case signed 11/29/2015)   RADIOGRAPHIC STUDIES: I have personally reviewed the radiological images as listed and  agreed with the findings in the report.  Bone scan 12/28/2014 IMPRESSION: Foci of increased tracer localization at the sternum, unable to exclude metastases.  Uptake at inferior cervical spine could potentially be related to degenerative disc disease changes present at C5-C6 on CT.  Questionable abnormal increased tracer localization at the posterior LEFT ninth and tenth ribs.  CT chest, abdomen and pelvis 12/28/2014 IMPRESSION: 1. 2 cm enhancing lesion in segment 5 of the liver is indeterminate. It could reflect FNH, vascular shunt and less likely metastasis. Recommend MRI abdomen without and with contrast using Eovist for further evaluation. No other hepatic lesions. 2. No findings for metastatic disease involving the chest. There is a focal area of airspace disease in the superior segment of the right lower lobe which could be a developing or resolving infiltrate. Recommend correlation with clinical symptoms.  ECHO 05/09/2016 Study Conclusions  - Left ventricle: The cavity size was normal. Wall thickness was  normal. Systolic function was normal. The estimated ejection  fraction was in the range of 50% to 55%. Wall motion was normal;  there were no regional wall motion abnormalities. Doppler  parameters are consistent with abnormal left ventricular  relaxation (grade 1 diastolic dysfunction). - Aortic valve: There was no stenosis. - Mitral valve: There was no significant regurgitation. - Right ventricle: The cavity size was normal. Wall thickness was  normal. Systolic function was normal. - Atrial septum: No defect or patent  foramen ovale was identified. - Tricuspid valve: Peak RV-RA gradient (S): 19 mm Hg. - Pulmonary arteries: PA peak pressure: 22 mm Hg (S). - Inferior vena cava: The vessel was normal in size. The  respirophasic diameter changes were in the normal range (= 50%),  consistent with normal central venous pressure. - Impressions: Low normal EF 50-55% abnormal GLS -14.4  Impressions:  - Low normal EF 50-55% abnormal GLS -14.4   ASSESSMENT & PLAN:  50 year old African-American female, surgical postmenopausal, history of stage I left breast cancer, with newly diagnosed right stage I breast cancer.  1. Right breast invasive ductal carcinoma, pT1cN1aM0, stage IIB, grade 2, ER100%+/PR100%+/HER2+ -I previously reviewed her surgical pathology results with patient in great details. She has 1 out of 3 sentinel lymph nodes positive, her pathological stage is more advanced than her initial clinical stage.  -We reviewed the natural history of triple positive breast cancer. HER-2 positive tumors are more progressive, with higher risk of recurrence -I reviewed her restaging CT scan and bone scan images with her in person. I discussed the bone scan findings with radiologist Dr. Thornton Papas. The mild hypermetabolic uptake in the sternum and left ninth and 10th rib have no significant corresponding change on the CT scan, except for small lytic spot in the sternum. They're not amenable for biopsy, PET scan may not have much additional value, the suspicion for metastases from breast cancer is low. I recommend follow-up. The 2 cm lesion in the liver are likely benign, unfortunately she is not able to do liver MRI, due to her breast implants.  -She is still concerned about the above scan findings. I recommend to have a repeated CT or PET scan a few months after she completes adjuvant chemotherapy for follow-up. -Given her high risk of cancer recurrence, I recommend adjuvant chemotherapy with docetaxel, carboplatin, Herceptin  and Perjeta (TCHP), every 3 weeks for 6 cycles,  followed by maintenance Herceptin to complete a 1 year therapy. She previously received Adriamycin, will not be able to tolerate more adrimycin. - Due to her slightly  decreased of EF on the repeat echo, Dr. Haroldine Laws has recommended to hold  Herceptin and pejeta for the last two cycle chemo, and will hold on Herceptin maintenance until her cardiomyopathy recovers. Her next echo is scheduled in 3 weeks -she will start breast and axilla radiation soon  -Given her strong ER/PR positivity, I recommend adjuvant AI after she completes radiation -continue breast cancer surveillance     2. Watery eyes  -Secondary to chemotherapy, no signs of infection or vision change -will likely improve over time since she completed chemo one month ago   3. Genetics -Due to her young age and recurrent breast cancer, she was referred to see a genetic counselor in our cancer center. -Her genetic test was negative  4. DM -she will continue follow-up with her primary care physician -She is on metformin   5. Bone health -She is postmenopausal by surgery -I encouraged her to take calcium and vitamin D -I'll obtain a baseline bone density scan before she starts AI   6. Insomnia and low appetite  -continue mirtazapine,  And Ambien - I given her a new prescription of Ambien 10 mg today   Plan for today  -will hold on herceptin for now  -she will start radiation soon  - she will see Dr. Haroldine Laws and have repeat echo on 6/20 -RTC in 4 weeks with lab, f/p and  Maintenance Herceptin if cleared by Dr. Haroldine Laws   All questions were answered. The patient knows to call the clinic with any problems, questions or concerns.  I spent 25 minutes counseling the patient face to face. The total time spent in the appointment was 30 minutes and more than 50% was on counseling.     Truitt Merle, MD 05/14/2016

## 2016-05-15 ENCOUNTER — Ambulatory Visit: Payer: BLUE CROSS/BLUE SHIELD | Admitting: Physical Therapy

## 2016-05-17 ENCOUNTER — Ambulatory Visit: Payer: BLUE CROSS/BLUE SHIELD | Attending: Radiation Oncology | Admitting: Physical Therapy

## 2016-05-17 ENCOUNTER — Ambulatory Visit
Admission: RE | Admit: 2016-05-17 | Discharge: 2016-05-17 | Disposition: A | Discharge status: Home / Self Care | Payer: BLUE CROSS/BLUE SHIELD | Source: Ambulatory Visit | Attending: Radiation Oncology | Admitting: Radiation Oncology

## 2016-05-17 DIAGNOSIS — C50411 Malignant neoplasm of upper-outer quadrant of right female breast: Secondary | ICD-10-CM | POA: Diagnosis not present

## 2016-05-17 DIAGNOSIS — I972 Postmastectomy lymphedema syndrome: Secondary | ICD-10-CM | POA: Diagnosis not present

## 2016-05-17 NOTE — Therapy (Addendum)
[place], [place], [address] Phone: [phone]   Fax:  [phone]  Physical Therapy Treatment  Patient Details  Name: Sandra James MRN: [id number] Date of Birth: 03/11/1966 Referring Provider: Dara Lords  Encounter Date: 05/17/2016      PT End of Session - 05/17/16 1705    Visit Number 3   Number of Visits 9   Date for PT Re-Evaluation 05/29/16   PT Start Time 1625  pt arrived to appt late and had to be measured for compression garment when she arrived   PT Stop Time 1700   PT Time Calculation (min) 35 min   Activity Tolerance Patient tolerated treatment well   Behavior During Therapy [facility] for tasks assessed/performed      Past Medical History  Diagnosis Date  . MVP (mitral valve prolapse)   . Infection     UTI  . Anxiety   . Fibroid   . Cancer [facility])     left brast lumpectomy   . Breast cancer of upper-outer quadrant of right female breast ([place]) 09/09/2015  . Diabetes mellitus without complication ([place])   . Depression   . GERD (gastroesophageal reflux disease)   . Complication of anesthesia     slow to wake up- BP was low, fainted next day-  . Breast cancer ([place]) 09/07/15    right breast  . Allergy   . UTI (lower urinary tract infection)   . Neuromuscular disorder ([place])     sciatic nerve paion left side/leg    Past Surgical History  Procedure Laterality Date  . Abdominal hysterectomy    . Breast surgery  2004    left lumpectomy  . Oophorectomy Bilateral   . Mastectomy w/ sentinel node biopsy Bilateral 11/25/2015    Procedure: BILATERAL SKIN SPARING MASTECTOMIES WITH RIGHT SENTINEL LYMPH NODE BIOPSY;  Surgeon: Stark Klein, MD;  Location: [place];  Service: General;  Laterality: Bilateral;  . Breast reconstruction with placement of tissue expander and flex hd (acellular hydrated dermis) Bilateral 11/25/2015    Procedure: BILATERAL IMMEDIATE BREAST RECONSTRUCTION  WITH PLACEMENT OF TISSUE EXPANDERS AND FLEX HD (ACELLULAR HYDRATED DERMIS);  Surgeon: Wallace Going, DO;  Location: [place];  Service: Plastics;  Laterality: Bilateral;  . Portacath placement Left 12/20/2015    Procedure: INSERTION PORT-A-CATH, left chest;  Surgeon: Stark Klein, MD;  Location: [place];  Service: General;  Laterality: Left;    There were no vitals filed for this visit.      Subjective Assessment - 05/17/16 1625    Subjective Pt states her arms, legs and butt are sore since she started walking more in the park. "I just want to get back to doing stuff."   Pertinent History Sandra James is a 50 y.o. female with h/o DM2 and GERD who was diagnosed with right breast invasive ductal carcinoma pT1cN1aM0, stage IIB, grade 2, ER+/PR+, HER2+ with + 1 out of 3 + sentinel lymph nodes. Discovered on screening mammogram 09/07/15. She is now s/p bilateral mastectomy 11/30/15, hx of left breast cancer in 2004 with lumpectomy, pt to undergo radiation, has completed chemo already and will begin hormone pill    Patient Stated Goals to get the swelling down   Currently in Pain? No/denies                         [facility] Adult PT Treatment/Exercise - 05/17/16 0001    Self-Care  Self-Care Other Self-Care Comments   Other Self-Care Comments  Instructed pt in self drainage technique including short neck, superficial and deep abdomen, stimulation of right inguinal nodes, establishment of inguino axilla pathway, then drainage of RUE from proximal to distal moving towards pathway    Manual Therapy   Manual Therapy --   Manual Lymphatic Drainage (MLD) --                PT Education - 05/17/16 1704    Education provided Yes   Education Details continued instruction in self drainage and gave handout for superficial and deep abdomen technique   Person(s) Educated Patient   Methods Explanation;Demonstration   Comprehension Verbalized understanding;Returned  demonstration;Verbal cues required;Tactile cues required                [facility] Goals - 05/17/16 1709    CC Long Term Goal  #1   Title Patient will report at least 50% perceived decrease in swelling and discomfort at area of right axilla   Time 4   Period Weeks   Status On-going   CC Long Term Goal  #2   Title Pt will be independent in stating lymphedema risk reduction practices   Time 4   Period Weeks   Status On-going   CC Long Term Goal  #3   Title Pt will obtain appropriate compression garments for long term management of edema   Baseline 05/17/16-pt measured for compression garment today   Time 4   Period Weeks   Status On-going   CC Long Term Goal  #4   Title Pt will be independent with self manual lymphatic drainage for long term management of edema   Baseline 05/17/16- pt able to return demonstrate correct technique with minimal cueing by end of session   Time 4   Period Weeks   Status On-going   CC Long Term Goal  #5   Title independent with HEP for shoulder ROM and self-care, including care of swelling at right axilla   Time 4   Period Weeks   Status On-going            Plan - 05/17/16 1707    Clinical Impression Statement Pt educated in self drainage technique. Walked through each step of manual lymphatic drainage technique and had pt demonstrate to therapist. She caught on very quickly and at end of session was able to verbalize and demonstrate steps for MLD with minimal cueing. Pt given handout on superficial and deep abdominal technique.    Rehab Potential Excellent   Clinical Impairments Affecting Rehab Potential pt to begin radiation   PT Frequency 2x / week   PT Duration 4 weeks   PT Treatment/Interventions ADLs/Self Care Home Management;Patient/family education;Manual lymph drainage   PT Next Visit Plan assess indep with self MLD, assess goals - did not have time this session   Consulted and Agree with Plan of Care Patient      Patient  will benefit from skilled therapeutic intervention in order to improve the following deficits and impairments:  Decreased knowledge of precautions, Increased fascial restricitons, Pain, Increased edema  Visit Diagnosis: Postmastectomy lymphedema     Problem List Patient Active Problem List   Diagnosis Date Noted  . Hypersensitivity reaction 04/03/2016  . 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 DM type 2 (diabetes mellitus, type 2) ([place]) 02/17/2016  . Dermatitis 02/11/2016  . Diarrhea 02/02/2016  . Dehydration 02/02/2016  . Hypokalemia 02/02/2016  . Syncope 02/02/2016  .  Genetic testing 10/08/2015  . History of left breast cancer 09/28/2015  . Family history of breast cancer in sister 09/28/2015  . Family history of colon cancer 09/28/2015  . Breast cancer of upper-outer quadrant of right female breast ([place]) 09/09/2015    Alexia Freestone 05/17/2016, 5:11 PM  [place], [place], [address] Phone: [phone]   Fax:  [phone]  Name: Gredmarie Delange MRN: [id number] Date of Birth: 09/08/1966    Allyson Sabal, PT 05/17/2016 5:11 PM  PHYSICAL THERAPY DISCHARGE SUMMARY  Visits from Start of Care: 3  Current functional level related to goals / functional outcomes: See above. Pt was measured for compression garments.   Remaining deficits: See above   Education / Equipment: Self MLD, compression garments Plan: Patient agrees to discharge.  Patient goals were not met. Patient is being discharged due to not returning since the last visit.  ?????    Allyson Sabal [place], [place] 12/31/17 9:40 AM

## 2016-05-19 ENCOUNTER — Other Ambulatory Visit: Payer: Self-pay | Admitting: Hematology

## 2016-05-19 ENCOUNTER — Telehealth: Payer: Self-pay | Admitting: *Deleted

## 2016-05-19 DIAGNOSIS — C50411 Malignant neoplasm of upper-outer quadrant of right female breast: Secondary | ICD-10-CM

## 2016-05-19 NOTE — Telephone Encounter (Signed)
I called pt back. Her arthralgia and muscular discomfort could be related to chemotherapy, I recommend physical therapy, she is interested, I'll refer her today. Okay to take Tylenol, or occasional ibuprofen. No need for pain medication for now.  Sandra James  05/19/2016

## 2016-05-19 NOTE — Telephone Encounter (Signed)
VM message received from patient stating she was having generalized body pain and requested a call back.  TC to pt and she states she is "total body aches and pains, including her hands and feet, legs, arms, and back-all related to movement. She states she thinks it has been going on since and even during her chemo but it seems to have gotten worse or she is more aware of it now. When she is resting it does not bother her but when she moves, bends etc it is very bothersome and makes it harder to do ADL's like cleaning etc.  She has not taken anything for the aches and pains.  She is not sure if she can take Ibuprofen d/t new heart medications. Advised to try Tylenol as needed and that I would pass on information to Dr. Burr Medico.

## 2016-05-24 ENCOUNTER — Ambulatory Visit
Admission: RE | Admit: 2016-05-24 | Discharge: 2016-05-24 | Disposition: A | Discharge status: Home / Self Care | Payer: BLUE CROSS/BLUE SHIELD | Source: Ambulatory Visit | Attending: Radiation Oncology | Admitting: Radiation Oncology

## 2016-05-24 DIAGNOSIS — C50411 Malignant neoplasm of upper-outer quadrant of right female breast: Secondary | ICD-10-CM | POA: Diagnosis not present

## 2016-05-25 ENCOUNTER — Ambulatory Visit
Admission: RE | Admit: 2016-05-25 | Discharge: 2016-05-25 | Disposition: A | Discharge status: Home / Self Care | Payer: BLUE CROSS/BLUE SHIELD | Source: Ambulatory Visit | Attending: Radiation Oncology | Admitting: Radiation Oncology

## 2016-05-25 VITALS — Wt 155.5 lb

## 2016-05-25 DIAGNOSIS — C50411 Malignant neoplasm of upper-outer quadrant of right female breast: Secondary | ICD-10-CM

## 2016-05-25 MED ORDER — ALRA NON-METALLIC DEODORANT (RAD-ONC): 1.0000 "application " | Freq: Once | TOPICAL | Status: DC

## 2016-05-25 MED ORDER — RADIAPLEXRX EX GEL: Freq: Once | CUTANEOUS | Status: DC

## 2016-05-25 NOTE — Progress Notes (Signed)
Patient education, Radiation therapy and you book, skin products=Alra deodorant,Radiaplex gel, my business card, flyer sheet on skin products, ways to manage side effects, fatigue,skin irritation, pain, tendersnees/swelling of breast, increase protein in diet, drink plenty water,stay hydrated, no shaving on affected area under axilla,may use electric shaver, dove soap unscented recommended, patient asking if she can just use the coconut oil on her breast,m deferred  To speak with Dr. Lisbeth Renshaw tomorrow, until then start using the radaiplex that has aloe in it, patient gave verbal understanding 11:09 AM

## 2016-05-26 ENCOUNTER — Encounter: Payer: Self-pay | Admitting: Radiation Oncology

## 2016-05-26 ENCOUNTER — Ambulatory Visit: Payer: BLUE CROSS/BLUE SHIELD | Admitting: Physical Therapy

## 2016-05-26 ENCOUNTER — Ambulatory Visit
Admission: RE | Admit: 2016-05-26 | Discharge: 2016-05-26 | Disposition: A | Discharge status: Home / Self Care | Payer: BLUE CROSS/BLUE SHIELD | Source: Ambulatory Visit | Attending: Radiation Oncology | Admitting: Radiation Oncology

## 2016-05-26 VITALS — BP 103/67 | HR 82 | Temp 98.4°F | Resp 16 | Wt 157.3 lb

## 2016-05-26 DIAGNOSIS — C50411 Malignant neoplasm of upper-outer quadrant of right female breast: Secondary | ICD-10-CM

## 2016-05-26 NOTE — Progress Notes (Signed)
Rad tx weekly right cw/breast  No skin changes, pt education done yesterday,  Started radiaplex bid, c/o back itchiness, no breast pain Appetite good 9:59 AM BP 103/67 mmHg  Pulse 82  Temp(Src) 98.4 F (36.9 C) (Oral)  Resp 16  Wt 157 lb 4.8 oz (71.351 kg)  Wt Readings from Last 3 Encounters:  05/26/16 157 lb 4.8 oz (71.351 kg)  05/25/16 155 lb 8 oz (70.534 kg)  05/11/16 157 lb 4.8 oz (71.351 kg)

## 2016-05-26 NOTE — Progress Notes (Signed)
Department of Radiation Oncology  Phone:  [phone] Fax:        [phone]  Weekly Treatment Note    Name: [name] Date: 05/27/2016 MRN: [id number] DOB: 1966/07/11   Diagnosis:     ICD-9-CM ICD-10-CM   1. Breast cancer of upper-outer quadrant of right female breast ([place]) 174.4 C50.411      Current dose: 3.6 Gy  Current fraction: 2   MEDICATIONS: Current Outpatient Prescriptions  Medication Sig Dispense Refill  . bisoprolol (ZEBETA) 5 MG tablet Take 0.5 tablets (2.5 mg total) by mouth at bedtime. 15 tablet 3  . fluticasone (FLONASE) 50 MCG/ACT nasal spray Place 1 spray into both nostrils daily as needed for allergies.   0  . hyaluronate sodium (RADIAPLEXRX) GEL Apply 1 application topically 2 (two) times daily.    Marland Kitchen ibuprofen (ADVIL,MOTRIN) 800 MG tablet Take 800 mg by mouth daily as needed for headache or moderate pain.    . Iron-Vitamins (GERITOL) LIQD Take 30 mLs by mouth daily. Energy support    . lidocaine-prilocaine (EMLA) cream Apply to affected area once 30 g 3  . losartan (COZAAR) 25 MG tablet Take 0.5 tablets (12.5 mg total) by mouth daily. 90 tablet 3  . mirtazapine (REMERON) 30 MG tablet Take 30 mg by mouth as needed. Reported on 03/30/2016  3  . Multiple Vitamin (MULTIVITAMIN) tablet Take 1 tablet by mouth daily.    . non-metallic deodorant Jethro Poling) MISC Apply 1 application topically daily as needed.    . polyethylene glycol (MIRALAX / GLYCOLAX) packet Take 17 g by mouth daily as needed for mild constipation. 14 each 0  . zolpidem (AMBIEN) 10 MG tablet Take 1 tablet (10 mg total) by mouth at bedtime as needed for sleep. 30 tablet 2  . magic mouthwash SOLN Take 5 mLs by mouth 4 (four) times daily as needed for mouth pain. (Patient not taking: Reported on 05/26/2016) 320 mL 1  . potassium chloride SA (K-DUR,KLOR-CON) 20 MEQ tablet Take 1 tablet (20 mEq total) by mouth daily. (Patient not taking: Reported on 05/11/2016) 30 tablet 1  . prochlorperazine  (COMPAZINE) 10 MG tablet Take 1 tablet (10 mg total) by mouth every 6 (six) hours as needed (Nausea or vomiting). (Patient not taking: Reported on 05/26/2016) 30 tablet 1   No current facility-administered medications for this encounter.   Facility-Administered Medications Ordered in Other Encounters  Medication Dose Route Frequency Provider Last Rate Last Dose  . sodium chloride 0.9 % injection 10 mL  10 mL Intracatheter PRN Truitt Merle, MD   10 mL at 01/06/16 1730     ALLERGIES: Lisinopril; Oxycodone; and Percocet   LABORATORY DATA:  Lab Results  Component Value Date   WBC 8.3 05/11/2016   HGB 10.7* 05/11/2016   HCT 31.8* 05/11/2016   MCV 97.7 05/11/2016   PLT 203 05/11/2016   Lab Results  Component Value Date   NA 140 05/11/2016   K 3.3* 05/11/2016   CL 103 03/12/2016   CO2 26 05/11/2016   Lab Results  Component Value Date   ALT 17 05/11/2016   AST 15 05/11/2016   ALKPHOS 83 05/11/2016   BILITOT 0.33 05/11/2016     NARRATIVE: Sandra James was seen today for weekly treatment management. The chart was checked and the patient's films were reviewed.  Rad tx weekly to the right chest wall. The patient started radiaplex bid. She complains of pruritus of the back. She has no chest wall pain and a good appetite. However,  she reports tenderness in the treatment field.  PHYSICAL EXAMINATION: weight is 157 lb 4.8 oz (71.351 kg). Her oral temperature is 98.4 F (36.9 C). Her blood pressure is 103/67 and her pulse is 82. Her respiration is 16.   ASSESSMENT: The patient is doing satisfactorily with treatment.  PLAN: We will continue with the patient's radiation treatment as planned. The patient questioned whether to use coconut oil instead of radiaplex. I said this was fine, even though the radiaplex is designed to help with radiation, but if the skin worsens, then she should switch to the radiaplex.  ------------------------------------------------  Jodelle Gross, MD,  PhD  This document serves as a record of services personally performed by Kyung Rudd, MD. It was created on his behalf by Darcus Austin, a trained medical scribe. The creation of this record is based on the scribe's personal observations and the provider's statements to them. This document has been checked and approved by the attending provider.

## 2016-05-29 ENCOUNTER — Ambulatory Visit
Admission: RE | Admit: 2016-05-29 | Discharge: 2016-05-29 | Disposition: A | Discharge status: Home / Self Care | Payer: BLUE CROSS/BLUE SHIELD | Source: Ambulatory Visit | Attending: Radiation Oncology | Admitting: Radiation Oncology

## 2016-05-29 DIAGNOSIS — C50411 Malignant neoplasm of upper-outer quadrant of right female breast: Secondary | ICD-10-CM | POA: Diagnosis not present

## 2016-05-30 ENCOUNTER — Other Ambulatory Visit (HOSPITAL_COMMUNITY): Payer: Self-pay | Admitting: Cardiology

## 2016-05-30 ENCOUNTER — Ambulatory Visit
Admission: RE | Admit: 2016-05-30 | Discharge: 2016-05-30 | Disposition: A | Discharge status: Home / Self Care | Payer: BLUE CROSS/BLUE SHIELD | Source: Ambulatory Visit | Attending: Radiation Oncology | Admitting: Radiation Oncology

## 2016-05-30 ENCOUNTER — Ambulatory Visit (HOSPITAL_BASED_OUTPATIENT_CLINIC_OR_DEPARTMENT_OTHER)
Admission: RE | Admit: 2016-05-30 | Discharge: 2016-05-30 | Disposition: A | Discharge status: Home / Self Care | Payer: BLUE CROSS/BLUE SHIELD | Source: Ambulatory Visit | Attending: Internal Medicine | Admitting: Internal Medicine

## 2016-05-30 ENCOUNTER — Ambulatory Visit (HOSPITAL_COMMUNITY)
Admission: RE | Admit: 2016-05-30 | Discharge: 2016-05-30 | Disposition: A | Discharge status: Home / Self Care | Payer: BLUE CROSS/BLUE SHIELD | Source: Ambulatory Visit | Attending: Internal Medicine | Admitting: Internal Medicine

## 2016-05-30 ENCOUNTER — Encounter (HOSPITAL_COMMUNITY): Payer: Self-pay | Admitting: Internal Medicine

## 2016-05-30 VITALS — BP 102/68 | HR 77 | Wt 153.5 lb

## 2016-05-30 DIAGNOSIS — T451X5A Adverse effect of antineoplastic and immunosuppressive drugs, initial encounter: Secondary | ICD-10-CM

## 2016-05-30 DIAGNOSIS — C50411 Malignant neoplasm of upper-outer quadrant of right female breast: Secondary | ICD-10-CM

## 2016-05-30 DIAGNOSIS — I340 Nonrheumatic mitral (valve) insufficiency: Secondary | ICD-10-CM | POA: Diagnosis not present

## 2016-05-30 DIAGNOSIS — I429 Cardiomyopathy, unspecified: Secondary | ICD-10-CM | POA: Diagnosis present

## 2016-05-30 DIAGNOSIS — I427 Cardiomyopathy due to drug and external agent: Secondary | ICD-10-CM

## 2016-05-30 LAB — ECHOCARDIOGRAM LIMITED
E decel time: 116 msec
E/e' ratio: 5.49
FS: 35 % (ref 28–44)
IVS/LV PW RATIO, ED: 1.12
LA diam end sys: 35 mm
LA vol A4C: 24.8 ml
LA vol index: 15.6 mL/m2
LADIAMINDEX: 1.93 cm/m2
LASIZE: 35 mm
LAVOL: 28.3 mL
LV E/e' medial: 5.49
LV PW d: 8.45 mm — AB (ref 0.6–1.1)
LV SIMPSON'S DISK: 62
LV TDI E'LATERAL: 15.9
LV TDI E'MEDIAL: 10.9
LV dias vol: 70 mL (ref 46–106)
LV e' LATERAL: 15.9 cm/s
LV sys vol index: 15 mL/m2
LVDIAVOLIN: 39 mL/m2
LVEEAVG: 5.49
LVOT area: 2.54 cm2
LVOTD: 18 mm
LVSYSVOL: 27 mL (ref 14–42)
MV Dec: 116
MV Peak grad: 3 mmHg
MVPKAVEL: 79.1 m/s
MVPKEVEL: 87.3 m/s
Reg peak vel: 204 cm/s
Stroke v: 44 ml
TRMAXVEL: 204 cm/s

## 2016-05-30 MED ORDER — BISOPROLOL FUMARATE 5 MG PO TABS: 2.5000 mg | ORAL_TABLET | Freq: Every day | ORAL | Status: DC

## 2016-05-30 NOTE — Progress Notes (Signed)
Echocardiogram 2D Echocardiogram limited has been performed.  Sandra James 05/30/2016, 3:06 PM

## 2016-05-30 NOTE — Progress Notes (Signed)
Patient ID: Sandra James, female   DOB: 08/13/1966, 49 y.o.   MRN: [id number]     CARDIO-ONCOLOGY CLINIC NOTE  Referring Physician: Feng Primary Care: Enrico Jones Primary Cardiologist: Dr. Bensimhon   HPI:  Sandra James is a 49 y.o. female with h/o DM2 and GERD who was diagnosed with right breast invasive ductal carcinoma pT1cN1aM0, stage IIB, grade 2, ER+/PR+, HER2+ with + 1 out of 3 + sentinel lymph nodes. Discovered on screening mammogram 09/07/15. She is now s/p bilateral mastectomy 11/30/15. She is referred to toe cardio-oncology clinic by Dr. Feng  Of note, she had a left breast lumpectomy in 2004 and was treated at that time with Adriamycin.    Oncology History   Breast cancer of upper-outer quadrant of right female breast ([facility])  Staging form: Breast, AJCC 7th Edition  Clinical stage from 09/15/2015: Stage IA (T1b, N0, M0) - Unsigned       Breast cancer of upper-outer quadrant of right female breast ([facility])   09/07/2015 Mammogram Diagnostic mammogram and the ultrasound of the right breast showed a 0.8 cm lobulated mass in the right breast 11 to 12:00 position 9 cm from the nipple. No other lesions or adenopathy.   09/08/2015 Initial Diagnosis Breast cancer of upper-outer quadrant of right female breast ([facility])   09/08/2015 Initial Biopsy Right breast mass at the upper outer quadrant core needle biopsy showed invasive ductal carcinoma, grade 2-3, ductal carcinoma in situ.   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 Left breast ostectomy was negative for malignancy.       She returns today for follow up.  Recent  echos notable for very mild cardiotoxicity. Herceptin stopped after 4/6 cycles. No further fatigue since coreg stopped. Bisoprolol 2.5 started last visit. Tolerating ok. Denies SOB or swelling.. Denies edema, Orthopnea, or PND. No CP.    Scheduled for 1 year of herceptin. (started 01/07/16)   ECHO 12/21/15 55-60% Lateral s' 11.0   cm/s     GLS -18.5% no MVP ECHO 03/21/16 50-55% Lateral s' 9.2     cm/s     GLS -16.1% ECHO 05/09/16 55%      Lateral s' 12.06 cm/s     GLS -14.4% ECHO 05/30/16 60%      Lateral s' 12.1  cm/s     GLS -18.0%  Past Medical History  Diagnosis Date  . MVP (mitral valve prolapse)   . Infection     UTI  . Anxiety   . Fibroid   . Cancer ([facility])     left brast lumpectomy   . Breast cancer of upper-outer quadrant of right female breast ([facility]) 09/09/2015  . Diabetes mellitus without complication ([facility])   . Depression   . GERD (gastroesophageal reflux disease)   . Complication of anesthesia     slow to wake up- BP was low, fainted next day-  . Breast cancer ([facility]) 09/07/15    right breast  . Allergy   . UTI (lower urinary tract infection)   . Neuromuscular disorder ([facility])     sciatic nerve paion left side/leg    Current Outpatient Prescriptions  Medication Sig Dispense Refill  . bisoprolol (ZEBETA) 5 MG tablet   Take 0.5 tablets (2.5 mg total) by mouth at bedtime. 15 tablet 3  . fluticasone (FLONASE) 50 MCG/ACT nasal spray Place 1 spray into both nostrils daily as needed for allergies.   0  . hyaluronate sodium (RADIAPLEXRX) GEL Apply 1 application topically 2 (two) times daily.    Marland Kitchen ibuprofen (ADVIL,MOTRIN) 800 MG tablet Take 800 mg by mouth daily as needed for headache or moderate pain.    . Iron-Vitamins (GERITOL) LIQD Take 30 mLs by mouth daily. Energy support    . losartan (COZAAR) 25 MG tablet Take 0.5 tablets (12.5 mg total) by mouth daily. 90 tablet 3  . mirtazapine (REMERON) 30 MG tablet Take 30 mg by mouth as needed. Reported on 03/30/2016  3  . Multiple Vitamin  (MULTIVITAMIN) tablet Take 1 tablet by mouth daily.    . non-metallic deodorant Jethro Poling) MISC Apply 1 application topically daily as needed.    . polyethylene glycol (MIRALAX / GLYCOLAX) packet Take 17 g by mouth daily as needed for mild constipation. 14 each 0  . zolpidem (AMBIEN) 10 MG tablet Take 1 tablet (10 mg total) by mouth at bedtime as needed for sleep. 30 tablet 2   No current facility-administered medications for this encounter.   Facility-Administered Medications Ordered in Other Encounters  Medication Dose Route Frequency Provider Last Rate Last Dose  . sodium chloride 0.9 % injection 10 mL  10 mL Intracatheter PRN Truitt Merle, MD   10 mL at 01/06/16 1730    Allergies  Allergen Reactions  . Lisinopril Cough  . Oxycodone Itching  . Percocet [Oxycodone-Acetaminophen] Itching      Social History   Social History  . Marital Status: Single    Spouse Name: N/A  . Number of Children: N/A  . Years of Education: N/A   Occupational History  . Not on file.   Social History Main Topics  . Smoking status: Never Smoker   . Smokeless tobacco: Never Used     Comment: previous secondhand smoke exposure  . Alcohol Use: 2.4 oz/week    4 Glasses of wine per week     Comment: 1 bottle of wine per wk  . Drug Use: No  . Sexual Activity: Yes    Birth Control/ Protection: Surgical   Other Topics Concern  . Not on file   Social History Narrative      Family History  Problem Relation Age of Onset  . Hypertension Mother   . Stroke Mother   . Alzheimer's disease Mother   . Heart disease Father   . Stroke Father   . Heart attack Father   . Breast cancer Sister 57    double mastectomy  . Other Sister     "stomach tumor that wrapped around reproductive organs"; dx. 13s; required partial hysterectomy  . Alzheimer's disease Maternal Grandmother   . Alzheimer's disease Paternal Grandmother   . Other Other     had to have lymph nodes removed and radiation; dx. 66s  . Cancer  Maternal Aunt     unspecified type; dx. <50  . Colon cancer Maternal Uncle     dx. 80s  . Breast cancer Cousin     dx. 50s    Filed Vitals:   05/30/16 1502  BP: 102/68  Pulse: 77  Weight: 153 lb 8 oz (69.627 kg)  SpO2: 100%   Wt Readings from Last 3 Encounters:  05/30/16 153 lb 8 oz (69.627 kg)  05/26/16 157 lb 4.8 oz (71.351 kg)  05/25/16  155 lb 8 oz (70.534 kg)     PHYSICAL EXAM: General:  Well appearing. No respiratory difficulty HEENT: normal Neck: supple. no JVD. Carotids 2+ bilat; no bruits. No thyromegaly or nodule noted.  Cor: s/p bilateral mastectomies. PMI nondisplaced. RRR. No M/G/R.  Lungs: CTAB, normal effort Abdomen: soft, NT, ND, no HSM. No bruits or masses. +BS  Extremities: no cyanosis, clubbing, rash, edema Neuro: alert & oriented x 3, cranial nerves grossly intact. moves all 4 extremities w/o difficulty. Affect pleasant.  ASSESSMENT & PLAN:  1. Breast cancer - Right breast invasive ductal carcinoma, pT1cN1aM0, stage IIB, grade 2, ER+/PR+/HER2+  - Now s/p bilateral mastectomy - Has a history of stage 1 L breast CA s/p lumpectomy ( now as above, s/p B mastectomy) and was treated at that time with Adriamycin.  - Now s/p 4/6 cycles of adjuvant chemo + herceptin/perjeta. Currently on hold.   2. Cardiotoxicity - Very mild chemo-induced cardiomyoapthy. Last echo EF ~55%. Echo today with complete normalization of LV function.  - Can restart herceptin/perjeta.  Repeat echo 2 months.  - Continue bisoprolol 2.5 and losartan 12.5 - May need several scheduled interruptions to try and complete 1 year of biological therapy.   Kahlie Deutscher,MD 3:13 PM

## 2016-05-30 NOTE — Patient Instructions (Signed)
Follow up and Echo in 2 months with Dr.Bensimhon

## 2016-05-31 ENCOUNTER — Ambulatory Visit
Admission: RE | Admit: 2016-05-31 | Discharge: 2016-05-31 | Disposition: A | Discharge status: Home / Self Care | Payer: BLUE CROSS/BLUE SHIELD | Source: Ambulatory Visit | Attending: Radiation Oncology | Admitting: Radiation Oncology

## 2016-05-31 DIAGNOSIS — C50411 Malignant neoplasm of upper-outer quadrant of right female breast: Secondary | ICD-10-CM | POA: Diagnosis not present

## 2016-06-01 ENCOUNTER — Ambulatory Visit
Admission: RE | Admit: 2016-06-01 | Discharge: 2016-06-01 | Disposition: A | Discharge status: Home / Self Care | Payer: BLUE CROSS/BLUE SHIELD | Source: Ambulatory Visit | Attending: Radiation Oncology | Admitting: Radiation Oncology

## 2016-06-01 DIAGNOSIS — C50411 Malignant neoplasm of upper-outer quadrant of right female breast: Secondary | ICD-10-CM | POA: Diagnosis not present

## 2016-06-01 MED ORDER — ALRA NON-METALLIC DEODORANT (RAD-ONC)
1.0000 "application " | Freq: Once | TOPICAL | Status: AC
  Administered 2016-06-01: 1 via TOPICAL

## 2016-06-02 ENCOUNTER — Ambulatory Visit
Admission: RE | Admit: 2016-06-02 | Discharge: 2016-06-02 | Disposition: A | Discharge status: Home / Self Care | Payer: BLUE CROSS/BLUE SHIELD | Source: Ambulatory Visit | Attending: Radiation Oncology | Admitting: Radiation Oncology

## 2016-06-02 ENCOUNTER — Encounter: Payer: Self-pay | Admitting: Radiation Oncology

## 2016-06-02 ENCOUNTER — Telehealth: Payer: Self-pay | Admitting: *Deleted

## 2016-06-02 VITALS — BP 107/64 | HR 80 | Temp 97.8°F | Resp 16 | Wt 155.7 lb

## 2016-06-02 DIAGNOSIS — C50411 Malignant neoplasm of upper-outer quadrant of right female breast: Secondary | ICD-10-CM | POA: Diagnosis not present

## 2016-06-02 NOTE — Progress Notes (Signed)
Weekly rad txs right breast 7/33 complete.kd, no skin changes, using radiaplex bid, appetite good, no c/o pain 9:05 AM BP 107/64 mmHg  Pulse 80  Temp(Src) 97.8 F (36.6 C) (Oral)  Resp 16  Wt 155 lb 11.2 oz (70.625 kg)  Wt Readings from Last 3 Encounters:  06/02/16 155 lb 11.2 oz (70.625 kg)  05/30/16 153 lb 8 oz (69.627 kg)  05/26/16 157 lb 4.8 oz (71.351 kg)

## 2016-06-02 NOTE — Telephone Encounter (Signed)
Left vm for pt to return call to assess needs during xrt. Contact information provided. 

## 2016-06-02 NOTE — Progress Notes (Signed)
  Radiation Oncology         [phone]   Name: Sandra James MRN: [id number]   Date: 06/02/2016  DOB: [birth year]   Weekly Radiation Therapy Management    ICD-9-CM ICD-10-CM   1. Breast cancer of upper-outer quadrant of right female breast ([place]) 174.4 C50.411     Current Dose: 12.6 Gy  Planned Dose:  60.4 Gy  Narrative The patient presents for routine under treatment assessment.  Weekly rad txs right breast 7/33. The patient has a good appetite, reports neck pain, has difficulty sleeping, and is using radiaplex bid.  Set-up films were reviewed. The chart was checked.  Physical Findings  weight is 155 lb 11.2 oz (70.625 kg). Her oral temperature is 97.8 F (36.6 C). Her blood pressure is 107/64 and her pulse is 80. Her respiration is 16. . Weight essentially stable.  No significant changes.  Impression The patient is tolerating radiation.  Plan Continue treatment as planned.     Sheral Apley Tammi Klippel, M.D.  This document serves as a record of services personally performed by Tyler Pita, MD. It was created on his behalf by Darcus Austin, a trained medical scribe. The creation of this record is based on the scribe's personal observations and the provider's statements to them. This document has been checked and approved by the attending provider.

## 2016-06-02 NOTE — Progress Notes (Signed)
Gave 10 cans food from the pantry,patient funds are minimal, stated"I'm trying to move into another apartment, If I can get 20 cans  To give the moving Lucianne Lei  They will cut the price in half",patient very appreciative  And grateful, said thank you  9:11 AM

## 2016-06-05 ENCOUNTER — Ambulatory Visit
Admission: RE | Admit: 2016-06-05 | Discharge: 2016-06-05 | Disposition: A | Discharge status: Home / Self Care | Payer: BLUE CROSS/BLUE SHIELD | Source: Ambulatory Visit | Attending: Radiation Oncology | Admitting: Radiation Oncology

## 2016-06-05 DIAGNOSIS — C50411 Malignant neoplasm of upper-outer quadrant of right female breast: Secondary | ICD-10-CM | POA: Diagnosis not present

## 2016-06-06 ENCOUNTER — Ambulatory Visit
Admission: RE | Admit: 2016-06-06 | Discharge: 2016-06-06 | Disposition: A | Discharge status: Home / Self Care | Payer: BLUE CROSS/BLUE SHIELD | Source: Ambulatory Visit | Attending: Radiation Oncology | Admitting: Radiation Oncology

## 2016-06-06 ENCOUNTER — Encounter: Payer: Self-pay | Admitting: Hematology

## 2016-06-06 ENCOUNTER — Telehealth: Payer: Self-pay | Admitting: *Deleted

## 2016-06-06 DIAGNOSIS — C50411 Malignant neoplasm of upper-outer quadrant of right female breast: Secondary | ICD-10-CM | POA: Diagnosis not present

## 2016-06-06 NOTE — Progress Notes (Signed)
forms left in my box- left for dr. Burr Medico to sign

## 2016-06-06 NOTE — Telephone Encounter (Signed)
Pt called & left vm stating that she needs a release to work letter for her part time job without restrictions.  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  She will be in Thurs to see Dr Burr Medico & would like to p/u then.  Address to whom it may concern.  Message to Dr Burr Medico.

## 2016-06-06 NOTE — Progress Notes (Signed)
forms left in my box °

## 2016-06-07 ENCOUNTER — Ambulatory Visit
Admission: RE | Admit: 2016-06-07 | Discharge: 2016-06-07 | Disposition: A | Discharge status: Home / Self Care | Payer: BLUE CROSS/BLUE SHIELD | Source: Ambulatory Visit | Attending: Radiation Oncology | Admitting: Radiation Oncology

## 2016-06-07 DIAGNOSIS — C50411 Malignant neoplasm of upper-outer quadrant of right female breast: Secondary | ICD-10-CM | POA: Diagnosis not present

## 2016-06-08 ENCOUNTER — Other Ambulatory Visit (HOSPITAL_BASED_OUTPATIENT_CLINIC_OR_DEPARTMENT_OTHER): Payer: BLUE CROSS/BLUE SHIELD

## 2016-06-08 ENCOUNTER — Encounter: Payer: Self-pay | Admitting: Hematology

## 2016-06-08 ENCOUNTER — Ambulatory Visit (HOSPITAL_BASED_OUTPATIENT_CLINIC_OR_DEPARTMENT_OTHER): Payer: BLUE CROSS/BLUE SHIELD

## 2016-06-08 ENCOUNTER — Ambulatory Visit: Payer: BLUE CROSS/BLUE SHIELD

## 2016-06-08 ENCOUNTER — Telehealth: Payer: Self-pay | Admitting: Hematology

## 2016-06-08 ENCOUNTER — Ambulatory Visit
Admission: RE | Admit: 2016-06-08 | Discharge: 2016-06-08 | Disposition: A | Discharge status: Home / Self Care | Payer: BLUE CROSS/BLUE SHIELD | Source: Ambulatory Visit | Attending: Radiation Oncology | Admitting: Radiation Oncology

## 2016-06-08 ENCOUNTER — Ambulatory Visit (HOSPITAL_BASED_OUTPATIENT_CLINIC_OR_DEPARTMENT_OTHER): Payer: BLUE CROSS/BLUE SHIELD | Admitting: Hematology

## 2016-06-08 VITALS — BP 107/55 | HR 80 | Temp 98.7°F | Resp 18 | Ht 66.0 in | Wt 154.2 lb

## 2016-06-08 DIAGNOSIS — G4700 Insomnia, unspecified: Secondary | ICD-10-CM

## 2016-06-08 DIAGNOSIS — C50411 Malignant neoplasm of upper-outer quadrant of right female breast: Secondary | ICD-10-CM | POA: Diagnosis not present

## 2016-06-08 DIAGNOSIS — Z853 Personal history of malignant neoplasm of breast: Secondary | ICD-10-CM

## 2016-06-08 DIAGNOSIS — E119 Type 2 diabetes mellitus without complications: Secondary | ICD-10-CM

## 2016-06-08 DIAGNOSIS — M859 Disorder of bone density and structure, unspecified: Secondary | ICD-10-CM

## 2016-06-08 DIAGNOSIS — Z5112 Encounter for antineoplastic immunotherapy: Secondary | ICD-10-CM | POA: Diagnosis not present

## 2016-06-08 LAB — CBC WITH DIFFERENTIAL/PLATELET
BASO%: 0.4 % (ref 0.0–2.0)
BASOS ABS: 0 10*3/uL (ref 0.0–0.1)
EOS%: 2.4 % (ref 0.0–7.0)
Eosinophils Absolute: 0.1 10*3/uL (ref 0.0–0.5)
HEMATOCRIT: 36 % (ref 34.8–46.6)
HGB: 12 g/dL (ref 11.6–15.9)
LYMPH%: 23.4 % (ref 14.0–49.7)
MCH: 32.1 pg (ref 25.1–34.0)
MCHC: 33.3 g/dL (ref 31.5–36.0)
MCV: 96.3 fL (ref 79.5–101.0)
MONO#: 0.5 10*3/uL (ref 0.1–0.9)
MONO%: 9 % (ref 0.0–14.0)
NEUT#: 3.2 10*3/uL (ref 1.5–6.5)
NEUT%: 64.8 % (ref 38.4–76.8)
PLATELETS: 245 10*3/uL (ref 145–400)
RBC: 3.74 10*6/uL (ref 3.70–5.45)
RDW: 14.2 % (ref 11.2–14.5)
WBC: 5 10*3/uL (ref 3.9–10.3)
lymph#: 1.2 10*3/uL (ref 0.9–3.3)

## 2016-06-08 LAB — COMPREHENSIVE METABOLIC PANEL
ALT: 16 U/L (ref 0–55)
ANION GAP: 9 meq/L (ref 3–11)
AST: 17 U/L (ref 5–34)
Albumin: 3.9 g/dL (ref 3.5–5.0)
Alkaline Phosphatase: 77 U/L (ref 40–150)
BUN: 11.1 mg/dL (ref 7.0–26.0)
CALCIUM: 9.4 mg/dL (ref 8.4–10.4)
CHLORIDE: 104 meq/L (ref 98–109)
CO2: 27 meq/L (ref 22–29)
Creatinine: 0.7 mg/dL (ref 0.6–1.1)
Glucose: 120 mg/dl (ref 70–140)
POTASSIUM: 3.7 meq/L (ref 3.5–5.1)
Sodium: 140 mEq/L (ref 136–145)
Total Bilirubin: 0.49 mg/dL (ref 0.20–1.20)
Total Protein: 7.3 g/dL (ref 6.4–8.3)

## 2016-06-08 MED ORDER — SODIUM CHLORIDE 0.9% FLUSH
10.0000 mL | INTRAVENOUS | Status: DC | PRN
  Administered 2016-06-08: 10 mL
  Filled 2016-06-08: qty 10

## 2016-06-08 MED ORDER — TRASTUZUMAB CHEMO INJECTION 440 MG
8.0000 mg/kg | Freq: Once | INTRAVENOUS | Status: AC
  Administered 2016-06-08: 567 mg via INTRAVENOUS
  Filled 2016-06-08: qty 27

## 2016-06-08 MED ORDER — SODIUM CHLORIDE 0.9 % IV SOLN
Freq: Once | INTRAVENOUS | Status: AC
  Administered 2016-06-08: 09:00:00 via INTRAVENOUS

## 2016-06-08 MED ORDER — DIPHENHYDRAMINE HCL 25 MG PO CAPS
50.0000 mg | ORAL_CAPSULE | Freq: Once | ORAL | Status: AC
Start: 2016-06-08 — End: 2016-06-08
  Administered 2016-06-08: 50 mg via ORAL

## 2016-06-08 MED ORDER — HEPARIN SOD (PORK) LOCK FLUSH 100 UNIT/ML IV SOLN
500.0000 [IU] | Freq: Once | INTRAVENOUS | Status: AC | PRN
  Administered 2016-06-08: 500 [IU]
  Filled 2016-06-08: qty 5

## 2016-06-08 MED ORDER — ACETAMINOPHEN 325 MG PO TABS
650.0000 mg | ORAL_TABLET | Freq: Once | ORAL | Status: AC
  Administered 2016-06-08: 650 mg via ORAL

## 2016-06-08 MED ORDER — ACETAMINOPHEN 325 MG PO TABS
ORAL_TABLET | ORAL | Status: AC
  Filled 2016-06-08: qty 2

## 2016-06-08 MED ORDER — DIPHENHYDRAMINE HCL 25 MG PO CAPS
ORAL_CAPSULE | ORAL | Status: AC
  Filled 2016-06-08: qty 1

## 2016-06-08 MED ORDER — SODIUM CHLORIDE 0.9 % IJ SOLN
10.0000 mL | INTRAMUSCULAR | Status: DC | PRN
  Administered 2016-06-08: 10 mL
  Filled 2016-06-08: qty 10

## 2016-06-08 NOTE — Patient Instructions (Signed)

## 2016-06-08 NOTE — Telephone Encounter (Signed)
Late entry:  Prescription, History, and Statement of Medical Necessity faxed to [facility] @ [phone] on 06/07/16.

## 2016-06-08 NOTE — Patient Instructions (Signed)
[place] Discharge Instructions for Patients Receiving Chemotherapy  Today you received the following chemotherapy agents Herceptin  To help prevent nausea and vomiting after your treatment, we encourage you to take your nausea medication   If you develop nausea and vomiting that is not controlled by your nausea medication, call the clinic.   BELOW ARE SYMPTOMS THAT SHOULD BE REPORTED IMMEDIATELY:  *FEVER GREATER THAN 100.5 F  *CHILLS WITH OR WITHOUT FEVER  NAUSEA AND VOMITING THAT IS NOT CONTROLLED WITH YOUR NAUSEA MEDICATION  *UNUSUAL SHORTNESS OF BREATH  *UNUSUAL BRUISING OR BLEEDING  TENDERNESS IN MOUTH AND THROAT WITH OR WITHOUT PRESENCE OF ULCERS  *URINARY PROBLEMS  *BOWEL PROBLEMS  UNUSUAL RASH Items with * indicate a potential emergency and should be followed up as soon as possible.  Feel free to call the clinic you have any questions or concerns. The clinic phone number is [phone].  Please show the [place] at check-in to the Emergency Department and triage nurse.   Trastuzumab injection for infusion HERCEPTIN What is this medicine? TRASTUZUMAB (tras TOO zoo mab) is a monoclonal antibody. It is used to treat breast cancer and stomach cancer. This medicine may be used for other purposes; ask your health care provider or pharmacist if you have questions. What should I tell my health care provider before I take this medicine? They need to know if you have any of these conditions: -heart disease -heart failure -infection (especially a virus infection such as chickenpox, cold sores, or herpes) -lung or breathing disease, like asthma -recent or ongoing radiation therapy -an unusual or allergic reaction to trastuzumab, benzyl alcohol, or other medications, foods, dyes, or preservatives -pregnant or trying to get pregnant -breast-feeding How should I use this medicine? This drug is given as an infusion into a vein. It is administered  in a hospital or clinic by a specially trained health care professional. Talk to your pediatrician regarding the use of this medicine in children. This medicine is not approved for use in children. Overdosage: If you think you have taken too much of this medicine contact a poison control center or emergency room at once. NOTE: This medicine is only for you. Do not share this medicine with others. What if I miss a dose? It is important not to miss a dose. Call your doctor or health care professional if you are unable to keep an appointment. What may interact with this medicine? -doxorubicin -warfarin This list may not describe all possible interactions. Give your health care provider a list of all the medicines, herbs, non-prescription drugs, or dietary supplements you use. Also tell them if you smoke, drink alcohol, or use illegal drugs. Some items may interact with your medicine. What should I watch for while using this medicine? Visit your doctor for checks on your progress. Report any side effects. Continue your course of treatment even though you feel ill unless your doctor tells you to stop. Call your doctor or health care professional for advice if you get a fever, chills or sore throat, or other symptoms of a cold or flu. Do not treat yourself. Try to avoid being around people who are sick. You may experience fever, chills and shaking during your first infusion. These effects are usually mild and can be treated with other medicines. Report any side effects during the infusion to your health care professional. Fever and chills usually do not happen with later infusions. Do not become pregnant while taking this medicine or for  7 months after stopping it. Women should inform their doctor if they wish to become pregnant or think they might be pregnant. Women of child-bearing potential will need to have a negative pregnancy test before starting this medicine. There is a potential for serious side  effects to an unborn child. Talk to your health care professional or pharmacist for more information. Do not breast-feed an infant while taking this medicine or for 7 months after stopping it. Women must use effective birth control with this medicine. What side effects may I notice from receiving this medicine? Side effects that you should report to your doctor or other health care professional as soon as possible: -breathing difficulties -chest pain or palpitations -cough -dizziness or fainting -fever or chills, sore throat -skin rash, itching or hives -swelling of the legs or ankles -unusually weak or tired Side effects that usually do not require medical attention (report to your doctor or other health care professional if they continue or are bothersome): -loss of appetite -headache -muscle aches -nausea This list may not describe all possible side effects. Call your doctor for medical advice about side effects. You may report side effects to FDA at 1-800-FDA-1088. Where should I keep my medicine? This drug is given in a hospital or clinic and will not be stored at home. NOTE: This sheet is a summary. It may not cover all possible information. If you have questions about this medicine, talk to your doctor, pharmacist, or health care provider.    2016, [employer]. (2015-03-05 11:49:32)

## 2016-06-08 NOTE — Progress Notes (Signed)
[place]  Telephone:([phone] [phone]  Clinic follow Up Note   Patient Care Team: Kristie Cowman, MD as PCP - General (Family Medicine) Stark Klein, MD as Consulting Physician (General Surgery) Truitt Merle, MD as Consulting Physician (Hematology) Arloa Koh, MD as Consulting Physician (Radiation Oncology) Mauro Kaufmann, RN as Registered Nurse Rockwell Germany, RN as Registered Nurse Sylvan Cheese, NP as Nurse Practitioner (Nurse Practitioner) 06/08/2016  CHIEF COMPLAINTS:  Follow up right breast cancer  Oncology History   Breast cancer [facility]   Staging form: Breast, AJCC 7th Edition     Pathologic stage from 11/25/2015: Stage IIA (T1c, N1a, cM0) - Unsigned     Pathologic stage from 11/25/2015: Stage IIA (T1c, N1a, cM0) - Unsigned Breast cancer of upper-outer quadrant of right female breast [facility])   Staging form: Breast, AJCC 7th Edition     Clinical stage from 09/15/2015: Stage IA (T1b, N0, M0) - Signed by Truitt Merle, MD on 12/16/2015     Pathologic stage from 11/25/2015: Stage IIA (T1c, N1a, cM0) - Signed by Truitt Merle, MD on 12/16/2015         Breast cancer of upper-outer quadrant of right female breast ([place])   09/07/2015 Mammogram Diagnostic mammogram and the ultrasound of the right breast showed a 0.8 cm lobulated mass in the right breast 11 to 12:00 position 9 cm from the nipple. No other lesions or adenopathy.   09/08/2015 Initial Diagnosis Breast cancer of upper-outer quadrant of right female breast ([place])   09/08/2015 Initial Biopsy Right breast mass at the upper outer quadrant core needle biopsy showed invasive ductal carcinoma, grade 2-3, ductal carcinoma in situ.   09/08/2015 Receptors her2 ER 100% positive, PR 100% positive, Ki-67 60%, HER-2 positive with copy # 4.25 and ratio 2.58   11/25/2015 Surgery Right breast mastectomy and sentinel lymph node biopsy   11/25/2015 Pathology Results Right breast invasive ductal carcinoma, grade 2, 2.0  cm, DCIS, intermediate grade, (+) LVI, margins were negative, 1 out of 3 lymph nodes were negative. Left breast ostectomy was negative for malignancy.   01/06/2016 - 04/20/2016 Chemotherapy adjuvant chemotherapy TCH P, every 3 weeks, for a total of 6 cycles. Herceptin and Perjeta were held for last two cycles due to drop of her EF on echo    05/25/2016 -  Radiation Therapy Adjuvant breast radiation   06/08/2016 -  Chemotherapy She started maintenance Herceptin every 3 weeks, after clearance from cardiology.    HISTORY OF PRESENTING ILLNESS:  Sandra James 50 y.o. female with past medical history of stage I left breast cancer, is here because of newly diagnosed right breast cancer. She presents to our multidisciplinary breast clinic by herself.  The right breast cancer was discovered by screening mammogram. She did not have any palpable mass, or any constitutional symptoms. She was diagnosed with stage I left breast cancer at age of 34, she had lumpectomy, radiation, and adjuvant chemotherapy and 5 years of tamoxifen. She was treated by Dr. Louann Sjogren in [place]. She moved to [facility] about year ago due to job change. She has been very compliant with annual screening mammogram.  She had hysterectomy and bilateral oophorectomy and sphincterectomy 5 years ago for heavy bleeding.. She has been having hot flashes since then, moderate, but manageable. She is single, lives alone, works for 2 to [employer] has to go up children who live in [place].  CURRENT THERAPY: Adjuvant chemotherapy with docetaxel, carboplatin, Herceptin and pejeta (TCHP) every 3  weeks started on 01/06/2016,  Herceptin and Pejeta was held for cycle 5 and 6 due to study decreased EF. Will start maintenance Herceptin when her cardiomyopathy recovers   [place] returns for follow-up. She was seen by cardiologist Dr. Haroldine Laws a week ago and repeated echo showed normal EF. She was cleared for starting Herceptin  maintenance. She has started adjuvant radiation about 2 weeks ago, tolerating well, she has recovered well from chemotherapy also, watery eyes has much improved, leg pain resolved, she has better appetite and energy level, is planning to return to work part-time. She has no other new complaints.  MED ICAL HISTORY:  Past Medical History  Diagnosis Date  . MVP (mitral valve prolapse)   . Infection     UTI  . Anxiety   . Fibroid   . Cancer [facility])     left brast lumpectomy   . Breast cancer of upper-outer quadrant of right female breast ([place]) 09/09/2015  . Diabetes mellitus without complication ([place])   . Depression   . GERD (gastroesophageal reflux disease)   . Complication of anesthesia     slow to wake up- BP was low, fainted next day-  . Breast cancer ([place]) 09/07/15    right breast  . Allergy   . UTI (lower urinary tract infection)   . Neuromuscular disorder ([place])     sciatic nerve paion left side/leg    SURGICAL HISTORY: Past Surgical History  Procedure Laterality Date  . Abdominal hysterectomy    . Breast surgery  2004    left lumpectomy  . Oophorectomy Bilateral   . Mastectomy w/ sentinel node biopsy Bilateral 11/25/2015    Procedure: BILATERAL SKIN SPARING MASTECTOMIES WITH RIGHT SENTINEL LYMPH NODE BIOPSY;  Surgeon: Stark Klein, MD;  Location: [place];  Service: General;  Laterality: Bilateral;  . Breast reconstruction with placement of tissue expander and flex hd (acellular hydrated dermis) Bilateral 11/25/2015    Procedure: BILATERAL IMMEDIATE BREAST RECONSTRUCTION WITH PLACEMENT OF TISSUE EXPANDERS AND FLEX HD (ACELLULAR HYDRATED DERMIS);  Surgeon: Wallace Going, DO;  Location: [place];  Service: Plastics;  Laterality: Bilateral;  . Portacath placement Left 12/20/2015    Procedure: INSERTION PORT-A-CATH, left chest;  Surgeon: Stark Klein, MD;  Location: [place] OR;  Service: General;  Laterality: Left;    SOCIAL HISTORY: Social  History   Social History  . Marital Status: Single     Spouse Name: N/A  . Number of Children: 2 children, 66 daughter and 21 yo son    . Years of Education: N/A   Occupational History  . She is a Barista rep   Social History Main Topics  . Smoking status: Never Smoker   . Smokeless tobacco: Never Used  . Alcohol Use: Yes     Comment: 2-3/wk  . Drug Use: No  . Sexual Activity: Yes    Birth Control/ Protection: Surgical   Other Topics Concern  . Not on file   Social History Narrative    FAMILY HISTORY: Family History  Problem Relation Age of Onset  . Hypertension Mother   . Stroke Mother   . Alzheimer's disease Mother   . Heart disease Father   . Stroke Father   . Heart attack Father   . Breast cancer Sister 70    double mastectomy  . Other Sister     "stomach tumor that wrapped around reproductive organs"; dx. 71s; required partial hysterectomy  . Alzheimer's disease Maternal Grandmother   .  Alzheimer's disease Paternal Grandmother   . Other Other     had to have lymph nodes removed and radiation; dx. 40s  . Cancer Maternal Aunt     unspecified type; dx. <50  . Colon cancer Maternal Uncle     dx. 62s  . Breast cancer Cousin     dx. 82s    ALLERGIES:  is allergic to lisinopril; oxycodone; and percocet.  MEDICATIONS:  Current Outpatient Prescriptions  Medication Sig Dispense Refill  . bisoprolol (ZEBETA) 5 MG tablet Take 0.5 tablets (2.5 mg total) by mouth at bedtime. 15 tablet 3  . fluticasone (FLONASE) 50 MCG/ACT nasal spray Place 1 spray into both nostrils daily as needed for allergies.   0  . hyaluronate sodium (RADIAPLEXRX) GEL Apply 1 application topically 2 (two) times daily.    Marland Kitchen ibuprofen (ADVIL,MOTRIN) 800 MG tablet Take 800 mg by mouth daily as needed for headache or moderate pain.    . Iron-Vitamins (GERITOL) LIQD Take 30 mLs by mouth daily. Energy support    . losartan (COZAAR) 25 MG tablet Take 0.5 tablets (12.5 mg total) by mouth daily. 90  tablet 3  . mirtazapine (REMERON) 30 MG tablet Take 30 mg by mouth as needed. Reported on 03/30/2016  3  . Multiple Vitamin (MULTIVITAMIN) tablet Take 1 tablet by mouth daily.    . non-metallic deodorant Jethro Poling) MISC Apply 1 application topically daily as needed.    . polyethylene glycol (MIRALAX / GLYCOLAX) packet Take 17 g by mouth daily as needed for mild constipation. 14 each 0  . zolpidem (AMBIEN) 10 MG tablet Take 1 tablet (10 mg total) by mouth at bedtime as needed for sleep. 30 tablet 2   No current facility-administered medications for this visit.   Facility-Administered Medications Ordered in Other Visits  Medication Dose Route Frequency Provider Last Rate Last Dose  . acetaminophen (TYLENOL) tablet 650 mg  650 mg Oral Once Truitt Merle, MD      . diphenhydrAMINE (BENADRYL) capsule 50 mg  50 mg Oral Once Truitt Merle, MD      . heparin lock flush 100 unit/mL  500 Units Intracatheter Once PRN Truitt Merle, MD      . sodium chloride flush (NS) 0.9 % injection 10 mL  10 mL Intracatheter PRN Truitt Merle, MD      . trastuzumab (HERCEPTIN) 567 mg in sodium chloride 0.9 % 250 mL chemo infusion  8 mg/kg (Treatment Plan Actual) Intravenous Once Truitt Merle, MD        REVIEW OF SYSTEMS:   Constitutional: Denies fevers, chills or abnormal night sweats Eyes: Denies blurriness of vision, double vision or watery eyes Ears, nose, mouth, throat, and face: Denies mucositis or sore throat Respiratory: Denies cough, dyspnea or wheezes Cardiovascular: Denies palpitation, chest discomfort or lower extremity swelling Gastrointestinal:  Denies nausea, heartburn or change in bowel habits Skin: Denies abnormal skin rashes Lymphatics: Denies new lymphadenopathy or easy bruising Neurological:Denies numbness, tingling or new weaknesses Behavioral/Psych: Mood is stable, no new changes  All other systems were reviewed with the patient and are negative.  PHYSICAL EXAMINATION: ECOG PERFORMANCE STATUS: 1  Filed Vitals:    06/08/16 0827  BP: 107/55  Pulse: 80  Temp: 98.7 F (37.1 C)  Resp: 18   Filed Weights   06/08/16 0827  Weight: 154 lb 3.2 oz (69.945 kg)    GENERAL:alert, no distress and comfortable SKIN: skin color, texture, turgor are normal, no rashes or significant lesions EYES: normal, conjunctiva are pink and  non-injected, sclera clear, eye lids appears normal OROPHARYNX:no exudate, no erythema and lips, buccal mucosa, and tongue normal  NECK: supple, thyroid normal size, non-tender, without nodularity LYMPH:  no palpable lymphadenopathy in the cervical, axillary or inguinal LUNGS: clear to auscultation and percussion with normal breathing effort HEART: regular rate & rhythm and no murmurs and no lower extremity edema ABDOMEN:abdomen soft, non-tender and normal bowel sounds Musculoskeletal:no cyanosis of digits and no clubbing  PSYCH: alert & oriented x 3 with fluent speech NEURO: no focal motor/sensory deficits Breasts: s/p bilateral mastectomy and tissue spender placement. Surgical incision sites are clean, no surrounding skin erythema or discharge.    LABORATORY DATA:  I have reviewed the data as listed CBC Latest Ref Rng 06/08/2016 05/11/2016 04/20/2016  WBC 3.9 - 10.3 10e3/uL 5.0 8.3 6.0  Hemoglobin 11.6 - 15.9 g/dL 12.0 10.7(L) 11.1(L)  Hematocrit 34.8 - 46.6 % 36.0 31.8(L) 33.7(L)  Platelets 145 - 400 10e3/uL 245 203 177    CMP Latest Ref Rng 06/08/2016 05/11/2016 04/20/2016  Glucose 70 - 140 mg/dl 120 130 89  BUN 7.0 - 26.0 mg/dL 11.1 6.2(L) 7.7  Creatinine 0.6 - 1.1 mg/dL 0.7 0.7 0.8  Sodium 136 - 145 mEq/L 140 140 142  Potassium 3.5 - 5.1 mEq/L 3.7 3.3(L) 3.6  CO2 22 - 29 mEq/L 27 26 28   Calcium 8.4 - 10.4 mg/dL 9.4 8.8 9.4  Total Protein 6.4 - 8.3 g/dL 7.3 6.7 7.1  Total Bilirubin 0.20 - 1.20 mg/dL 0.49 0.33 0.69  Alkaline Phos 40 - 150 U/L 77 83 70  AST 5 - 34 U/L 17 15 24   ALT 0 - 55 U/L 16 17 31      Pathology report Diagnosis 11/25/2015 1. Breast, simple mastectomy,  right - INVASIVE DUCTAL CARCINOMA, GRADE 2/3, SPANNING 2.0 CM. - DUCTAL CARCINOMA IN SITU, INTERMEDIATE GRADE. - LYMPHOVASCULAR INVASION IS IDENTIFIED. - LOBULAR NEOPLASIA (ATYPICAL LOBULAR HYPERPLASIA). - THE SURGICAL RESECTION MARGINS ARE NEGATIVE FOR CARCINOMA. - SEE ONCOLOGY TABLE BELOW. 2. Lymph node, sentinel, biopsy, right axillary #1 - METASTATIC CARCINOMA IN 1 OF 1 LYMPH NODE (1/1). 3. Lymph node, sentinel, biopsy, right axillary #2 - THERE IS NO EVIDENCE OF CARCINOMA IN 1 OF 1 LYMPH NODE (0/1). 4. Lymph node, sentinel, biopsy, right axillary #3 - THERE IS NO EVIDENCE OF CARCINOMA IN 1 OF 1 LYMPH NODE (0/1). 5. Breast, simple mastectomy, left - BENIGN BREAST PARENCHYMA WITH DENSE STROMAL FIBROSIS. - FIBROADENOMA. - THERE IS NO EVIDENCE OF MALIGNANCY. 6. Breast, excision, left, additional lateral margin - BENIGN FIBROADIPOSE TISSUE. - THERE IS NO EVIDENCE OF MALIGNANCY. - SEE COMMENT. Microscopic Comment 1. BREAST, INVASIVE TUMOR, WITH LYMPH NODES PRESENT Specimen, including laterality and lymph node sampling (sentinel, non-sentinel): Right breast and right axillary lymph nodes Procedure: Bilateral mastectomy and multiple right axillary lymph node resections Histologic type: Ductal Grade: 2 Tubule formation: 2 1 of 4 FINAL for Dorce, Sydne ([id number]) Microscopic Comment(continued) Nuclear pleomorphism: 2 Mitotic: 2 Tumor size (gross measurement): 2.0 cm Margins: Negative for carcinoma Invasive, distance to closest margin: 1.8 cm to the posterior margin In-situ, distance to closest margin: 1.8 cm to the posterior margin Lymphovascular invasion: Present Ductal carcinoma in situ: Present Grade: Intermediate grade Extensive intraductal component: Not identified Lobular neoplasia: Present, atypical lobular hyperplasia Tumor focality: Unifocal Treatment effect: Not identified Extent of tumor: Confined to breast parenchyma Lymph nodes: Examined: 3 Sentinel 0  Non-sentinel 3 Total Lymph nodes with metastasis: 1 (macrometastasis) - no extracapsular extension is identified. Breast prognostic profile: [phone] Estrogen  receptor: 100%, strong staining intensity Progesterone receptor: 100%, strong staining intensity Her 2 neu: Amplification was detected. The ratio was 2.58 Ki-67: 60% TNM: pT1c, pN1a Non-neoplastic breast: No significant findings. 6. The surgical resection margin(s) of the specimen were inked and microscopically evaluated. Enid Cutter MD Pathologist, Electronic Signature (Case signed 11/29/2015)   RADIOGRAPHIC STUDIES: I have personally reviewed the radiological images as listed and agreed with the findings in the report.  Bone scan 12/28/2014 IMPRESSION: Foci of increased tracer localization at the sternum, unable to exclude metastases.  Uptake at inferior cervical spine could potentially be related to degenerative disc disease changes present at C5-C6 on CT.  Questionable abnormal increased tracer localization at the posterior LEFT ninth and tenth ribs.  CT chest, abdomen and pelvis 12/28/2014 IMPRESSION: 1. 2 cm enhancing lesion in segment 5 of the liver is indeterminate. It could reflect FNH, vascular shunt and less likely metastasis. Recommend MRI abdomen without and with contrast using Eovist for further evaluation. No other hepatic lesions. 2. No findings for metastatic disease involving the chest. There is a focal area of airspace disease in the superior segment of the right lower lobe which could be a developing or resolving infiltrate. Recommend correlation with clinical symptoms.  ECHO 05/09/2016 Study Conclusions  - Left ventricle: The cavity size was normal. Wall thickness was  normal. Systolic function was normal. The estimated ejection  fraction was in the range of 50% to 55%. Wall motion was normal;  there were no regional wall motion abnormalities. Doppler  parameters are consistent with  abnormal left ventricular  relaxation (grade 1 diastolic dysfunction). - Aortic valve: There was no stenosis. - Mitral valve: There was no significant regurgitation. - Right ventricle: The cavity size was normal. Wall thickness was  normal. Systolic function was normal. - Atrial septum: No defect or patent foramen ovale was identified. - Tricuspid valve: Peak RV-RA gradient (S): 19 mm Hg. - Pulmonary arteries: PA peak pressure: 22 mm Hg (S). - Inferior vena cava: The vessel was normal in size. The  respirophasic diameter changes were in the normal range (= 50%),  consistent with normal central venous pressure. - Impressions: Low normal EF 50-55% abnormal GLS -14.4  Impressions:  - Low normal EF 50-55% abnormal GLS -14.4   ASSESSMENT & PLAN:  50 year old African-American female, surgical postmenopausal, history of stage I left breast cancer, with newly diagnosed right stage I breast cancer.  1. Right breast invasive ductal carcinoma, pT1cN1aM0, stage IIB, grade 2, ER100%+/PR100%+/HER2+ -I previously reviewed her surgical pathology results with patient in great details. She has 1 out of 3 sentinel lymph nodes positive, her pathological stage is more advanced than her initial clinical stage.  -We reviewed the natural history of triple positive breast cancer. HER-2 positive tumors are more progressive, with higher risk of recurrence -I reviewed her restaging CT scan and bone scan images with her in person. I discussed the bone scan findings with radiologist Dr. Thornton Papas. The mild hypermetabolic uptake in the sternum and left ninth and 10th rib have no significant corresponding change on the CT scan, except for small lytic spot in the sternum. They're not amenable for biopsy, PET scan may not have much additional value, the suspicion for metastases from breast cancer is low. I recommend follow-up. The 2 cm lesion in the liver are likely benign, unfortunately she is not able to do liver MRI,  due to her breast implants.  -She is still concerned about the above scan findings. I recommend to have a  repeated CT or PET scan a few months after she completes adjuvant chemotherapy for follow-up. -Given her high risk of cancer recurrence, I recommend adjuvant chemotherapy with docetaxel, carboplatin, Herceptin and Perjeta (TCHP), every 3 weeks for 6 cycles,  followed by maintenance Herceptin to complete a 1 year therapy. She previously received Adriamycin, will not be able to tolerate more adrimycin. Herceptin and pejeta was held for the last 2 cycle chemotherapy, due to her decreased EF. -Her repeated echo has showed normal EF, she will start Herceptin maintenance therapy today. I do not plan to resume Perjeta.  -She tolerating adjuvant breast and axilla radiation well -Given her strong ER/PR positivity, I recommend adjuvant AI after she completes radiation -continue breast cancer surveillance     2. Watery eyes  -Secondary to chemotherapy, no signs of infection or vision change -near resolved now   3. Genetics -Due to her young age and recurrent breast cancer, she was referred to see a genetic counselor in our cancer center. -Her genetic test was negative  4. DM -she will continue follow-up with her primary care physician -She is on metformin   5. Bone health -She is postmenopausal by surgery -I encouraged her to take calcium and vitamin D -I'll obtain a baseline bone density scan before she starts AI   6. Insomnia and low appetite  -continue mirtazapine,  And Ambien   Plan for today  -restart herceptin today with loading dose 48m/kg and will continue at 660mm2 every 3 weeks  - she will see Dr. BeHaroldine Lawsnd have repeat echo  at the end of August  -She will continue radiation  -I'll see her back in 6 weeks, and start her on aromatase inhibitor if she recovers well from radiation  -I gave her a letter of returning work as part-time today   All questions were answered. The  patient knows to call the clinic with any problems, questions or concerns.  I spent 25 minutes counseling the patient face to face. The total time spent in the appointment was 30 minutes and more than 50% was on counseling.     FeTruitt MerleMD 06/08/2016

## 2016-06-08 NOTE — Telephone Encounter (Signed)
Gave and pritned appt sched and avs fo rpt for June July and Aug °

## 2016-06-09 ENCOUNTER — Ambulatory Visit
Admission: RE | Admit: 2016-06-09 | Discharge: 2016-06-09 | Disposition: A | Discharge status: Home / Self Care | Payer: BLUE CROSS/BLUE SHIELD | Source: Ambulatory Visit | Attending: Radiation Oncology | Admitting: Radiation Oncology

## 2016-06-09 DIAGNOSIS — C50411 Malignant neoplasm of upper-outer quadrant of right female breast: Secondary | ICD-10-CM

## 2016-06-09 NOTE — Progress Notes (Signed)
Weekly rad tx right breast,cw/sclav   12 completed,       Mild hyperpigmentation, swelling, and occasional twinges, appetite good, flight fatigue,using radiaplex bid There were no vitals taken for this visit.  Wt Readings from Last 3 Encounters:  06/08/16 154 lb 3.2 oz (69.945 kg)  06/02/16 155 lb 11.2 oz (70.625 kg)  05/30/16 153 lb 8 oz (69.627 kg)

## 2016-06-09 NOTE — Progress Notes (Signed)
   Department of Radiation Oncology  Phone:  [phone] Fax:        [phone]  Weekly Treatment Note    Name: Sandra James Date: 06/09/2016 MRN: [id number] DOB: 1966-05-06   Diagnosis:     ICD-9-CM ICD-10-CM   1. Breast cancer of upper-outer quadrant of right female breast ([place]) 174.4 C50.411      Current dose: 21.6 Gy  Current fraction: 12   MEDICATIONS: Current Outpatient Prescriptions  Medication Sig Dispense Refill  . bisoprolol (ZEBETA) 5 MG tablet Take 0.5 tablets (2.5 mg total) by mouth at bedtime. 15 tablet 3  . fluticasone (FLONASE) 50 MCG/ACT nasal spray Place 1 spray into both nostrils daily as needed for allergies.   0  . hyaluronate sodium (RADIAPLEXRX) GEL Apply 1 application topically 2 (two) times daily.    Marland Kitchen ibuprofen (ADVIL,MOTRIN) 800 MG tablet Take 800 mg by mouth daily as needed for headache or moderate pain.    . Iron-Vitamins (GERITOL) LIQD Take 30 mLs by mouth daily. Energy support    . losartan (COZAAR) 25 MG tablet Take 0.5 tablets (12.5 mg total) by mouth daily. 90 tablet 3  . mirtazapine (REMERON) 30 MG tablet Take 30 mg by mouth as needed. Reported on 03/30/2016  3  . Multiple Vitamin (MULTIVITAMIN) tablet Take 1 tablet by mouth daily.    . non-metallic deodorant Jethro Poling) MISC Apply 1 application topically daily as needed.    . polyethylene glycol (MIRALAX / GLYCOLAX) packet Take 17 g by mouth daily as needed for mild constipation. 14 each 0  . zolpidem (AMBIEN) 10 MG tablet Take 1 tablet (10 mg total) by mouth at bedtime as needed for sleep. 30 tablet 2   No current facility-administered medications for this encounter.     ALLERGIES: Lisinopril; Oxycodone; and Percocet   LABORATORY DATA:  Lab Results  Component Value Date   WBC 5.0 06/08/2016   HGB 12.0 06/08/2016   HCT 36.0 06/08/2016   MCV 96.3 06/08/2016   PLT 245 06/08/2016   Lab Results  Component Value Date   NA 140 06/08/2016   K 3.7 06/08/2016   CL 103 03/12/2016   CO2 27 06/08/2016   Lab Results  Component Value Date   ALT 16 06/08/2016   AST 17 06/08/2016   ALKPHOS 77 06/08/2016   BILITOT 0.49 06/08/2016     NARRATIVE: Sandra James was seen today for weekly treatment management. The chart was checked and the patient's films were reviewed.  Weekly rad tx right breast,cw/sclav   12 completed,       Mild hyperpigmentation, swelling, and occasional twinges, appetite good, flight fatigue,using radiaplex bid There were no vitals taken for this visit.  Wt Readings from Last 3 Encounters:  06/08/16 154 lb 3.2 oz (69.945 kg)  06/02/16 155 lb 11.2 oz (70.625 kg)  05/30/16 153 lb 8 oz (69.627 kg)    PHYSICAL EXAMINATION: vitals were not taken for this visit.     The patient's skin shows some mild hyperpigmentation. No significant desquamation currently.  ASSESSMENT: The patient is doing satisfactorily with treatment.  PLAN: We will continue with the patient's radiation treatment as planned.

## 2016-06-12 ENCOUNTER — Ambulatory Visit
Admission: RE | Admit: 2016-06-12 | Discharge: 2016-06-12 | Disposition: A | Discharge status: Home / Self Care | Payer: BLUE CROSS/BLUE SHIELD | Source: Ambulatory Visit | Attending: Radiation Oncology | Admitting: Radiation Oncology

## 2016-06-12 DIAGNOSIS — C50411 Malignant neoplasm of upper-outer quadrant of right female breast: Secondary | ICD-10-CM | POA: Diagnosis not present

## 2016-06-14 ENCOUNTER — Telehealth: Payer: Self-pay | Admitting: *Deleted

## 2016-06-14 ENCOUNTER — Ambulatory Visit
Admission: RE | Admit: 2016-06-14 | Discharge: 2016-06-14 | Disposition: A | Discharge status: Home / Self Care | Payer: BLUE CROSS/BLUE SHIELD | Source: Ambulatory Visit | Attending: Radiation Oncology | Admitting: Radiation Oncology

## 2016-06-14 DIAGNOSIS — C50411 Malignant neoplasm of upper-outer quadrant of right female breast: Secondary | ICD-10-CM | POA: Diagnosis not present

## 2016-06-14 NOTE — Telephone Encounter (Signed)
Returned patiet call statsding her arm where it is being radiated has started hurting, not sure if is a nerve pinching, asked if she could take advil or tyleon now,  She works until [employer], she will take advil tonight once she gets home and wants to see MD before treatment tomorrow, it didn't hurt today after tx until later in the day at work, will schedule pateit to see mD, arrive 20 minutes earlier than her treatment time 3:28 PM

## 2016-06-15 ENCOUNTER — Ambulatory Visit
Admission: RE | Admit: 2016-06-15 | Discharge: 2016-06-15 | Disposition: A | Discharge status: Home / Self Care | Payer: BLUE CROSS/BLUE SHIELD | Source: Ambulatory Visit | Attending: Radiation Oncology | Admitting: Radiation Oncology

## 2016-06-15 ENCOUNTER — Encounter: Payer: Self-pay | Admitting: Radiation Oncology

## 2016-06-15 DIAGNOSIS — C50411 Malignant neoplasm of upper-outer quadrant of right female breast: Secondary | ICD-10-CM | POA: Diagnosis not present

## 2016-06-15 NOTE — Progress Notes (Signed)
The patient was seen today with her complaining of some radiating pain to the right upper extremity. His began a couple of days ago although it is slightly better today. She states that she has been fine with the positioning for her radiation treatment and is able to raise her arm above her without difficulty. She does have a history of carpal tunnel but she feels this is different.  We discussed that this would not be related to her course of radiation treatment directly. Sometimes some discomfort can occur in the axilla due to positioning issues, but again she seems fine during treatment. We will continue to follow this and she will let us know if this continues or worsens. I do not believe this is related to her radiation treatment and therefore she may need a broader workup if necessary.

## 2016-06-15 NOTE — Progress Notes (Signed)
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

## 2016-06-16 ENCOUNTER — Ambulatory Visit
Admission: RE | Admit: 2016-06-16 | Discharge: 2016-06-16 | Disposition: A | Discharge status: Home / Self Care | Payer: BLUE CROSS/BLUE SHIELD | Source: Ambulatory Visit | Attending: Radiation Oncology | Admitting: Radiation Oncology

## 2016-06-16 DIAGNOSIS — C50411 Malignant neoplasm of upper-outer quadrant of right female breast: Secondary | ICD-10-CM | POA: Diagnosis not present

## 2016-06-16 NOTE — Progress Notes (Signed)
Department of Radiation Oncology  Phone:  [phone] Fax:        [phone]  Weekly Treatment Note    Name: Sandra James Date: 06/16/2016 MRN: [id number] DOB: [birth year]   Diagnosis:     ICD-9-CM ICD-10-CM   1. Breast cancer of upper-outer quadrant of right female breast ([place]) 174.4 C50.411      Current dose: 28.8 Gy  Current fraction: 16   MEDICATIONS: Current Outpatient Prescriptions  Medication Sig Dispense Refill  . bisoprolol (ZEBETA) 5 MG tablet Take 0.5 tablets (2.5 mg total) by mouth at bedtime. 15 tablet 3  . fluticasone (FLONASE) 50 MCG/ACT nasal spray Place 1 spray into both nostrils daily as needed for allergies.   0  . hyaluronate sodium (RADIAPLEXRX) GEL Apply 1 application topically 2 (two) times daily.    Marland Kitchen ibuprofen (ADVIL,MOTRIN) 800 MG tablet Take 800 mg by mouth daily as needed for headache or moderate pain.    . Iron-Vitamins (GERITOL) LIQD Take 30 mLs by mouth daily. Energy support    . losartan (COZAAR) 25 MG tablet Take 0.5 tablets (12.5 mg total) by mouth daily. 90 tablet 3  . mirtazapine (REMERON) 30 MG tablet Take 30 mg by mouth as needed. Reported on 03/30/2016  3  . Multiple Vitamin (MULTIVITAMIN) tablet Take 1 tablet by mouth daily.    . non-metallic deodorant Jethro Poling) MISC Apply 1 application topically daily as needed.    . polyethylene glycol (MIRALAX / GLYCOLAX) packet Take 17 g by mouth daily as needed for mild constipation. 14 each 0  . zolpidem (AMBIEN) 10 MG tablet Take 1 tablet (10 mg total) by mouth at bedtime as needed for sleep. 30 tablet 2   No current facility-administered medications for this encounter.     ALLERGIES: Lisinopril; Oxycodone; and Percocet   LABORATORY DATA:  Lab Results  Component Value Date   WBC 5.0 06/08/2016   HGB 12.0 06/08/2016   HCT 36.0 06/08/2016   MCV 96.3 06/08/2016   PLT 245 06/08/2016   Lab Results  Component Value Date   NA 140 06/08/2016   K 3.7 06/08/2016   CL 103 03/12/2016   CO2 27 06/08/2016   Lab Results  Component Value Date   ALT 16 06/08/2016   AST 17 06/08/2016   ALKPHOS 77 06/08/2016   BILITOT 0.49 06/08/2016     NARRATIVE: Sandra James was seen today for weekly treatment management. The chart was checked and the patient's films were reviewed.  She continues to experience some stiffness in her arm. She will be getting a lymphedema sleeve later today and hopes this will help.    There were no vitals taken for this visit.  Wt Readings from Last 3 Encounters:  06/15/16 154 lb 3.2 oz (69.945 kg)  06/08/16 154 lb 3.2 oz (69.945 kg)  06/02/16 155 lb 11.2 oz (70.625 kg)    PHYSICAL EXAMINATION: vitals were not taken for this visit.    Alert, in no acute distress.  ASSESSMENT: The patient is doing satisfactorily with treatment.  PLAN: We will continue with the patient's radiation treatment as planned.      ------------------------------------------------  Jodelle Gross, MD, PhD  This document serves as a record of services personally performed by Kyung Rudd, MD. It was created on his behalf by Arlyce Harman, a trained medical scribe. The creation of this record is based on the scribe's personal observations and the provider's statements to them. This document has been checked and approved by the attending provider.

## 2016-06-16 NOTE — Progress Notes (Signed)
Patient seen in the back by MD ,not sent to nursing, she was seen yesterday as well 9:47 AM

## 2016-06-18 ENCOUNTER — Ambulatory Visit: Payer: BLUE CROSS/BLUE SHIELD

## 2016-06-19 ENCOUNTER — Ambulatory Visit
Admission: RE | Admit: 2016-06-19 | Discharge: 2016-06-19 | Disposition: A | Discharge status: Home / Self Care | Payer: BLUE CROSS/BLUE SHIELD | Source: Ambulatory Visit | Attending: Radiation Oncology | Admitting: Radiation Oncology

## 2016-06-19 ENCOUNTER — Ambulatory Visit: Payer: BLUE CROSS/BLUE SHIELD

## 2016-06-19 ENCOUNTER — Telehealth: Payer: Self-pay | Admitting: *Deleted

## 2016-06-19 DIAGNOSIS — C50411 Malignant neoplasm of upper-outer quadrant of right female breast: Secondary | ICD-10-CM | POA: Insufficient documentation

## 2016-06-19 DIAGNOSIS — Z510 Encounter for antineoplastic radiation therapy: Secondary | ICD-10-CM | POA: Insufficient documentation

## 2016-06-19 MED FILL — ZOLPIDEM TARTRATE 10 MG TAB: 10 | 30 days supply | Qty: 30 | Fill #1

## 2016-06-19 NOTE — Telephone Encounter (Signed)
[facility] [place] per Brian,pharmacist,patient has refills on her Ambien, asked that he fill  The rx,called and left vm on patient's home phone,she can pick up rx today,call us for any questions 12:43 PM

## 2016-06-20 ENCOUNTER — Ambulatory Visit
Admission: RE | Admit: 2016-06-20 | Discharge: 2016-06-20 | Disposition: A | Discharge status: Home / Self Care | Payer: BLUE CROSS/BLUE SHIELD | Source: Ambulatory Visit | Attending: Radiation Oncology | Admitting: Radiation Oncology

## 2016-06-20 ENCOUNTER — Ambulatory Visit: Payer: BLUE CROSS/BLUE SHIELD

## 2016-06-20 DIAGNOSIS — Z510 Encounter for antineoplastic radiation therapy: Secondary | ICD-10-CM | POA: Diagnosis not present

## 2016-06-21 ENCOUNTER — Ambulatory Visit
Admission: RE | Admit: 2016-06-21 | Discharge: 2016-06-21 | Disposition: A | Discharge status: Home / Self Care | Payer: BLUE CROSS/BLUE SHIELD | Source: Ambulatory Visit | Attending: Radiation Oncology | Admitting: Radiation Oncology

## 2016-06-21 ENCOUNTER — Ambulatory Visit: Payer: BLUE CROSS/BLUE SHIELD

## 2016-06-21 DIAGNOSIS — Z510 Encounter for antineoplastic radiation therapy: Secondary | ICD-10-CM | POA: Diagnosis not present

## 2016-06-22 ENCOUNTER — Ambulatory Visit: Payer: BLUE CROSS/BLUE SHIELD

## 2016-06-22 ENCOUNTER — Ambulatory Visit
Admission: RE | Admit: 2016-06-22 | Discharge: 2016-06-22 | Disposition: A | Discharge status: Home / Self Care | Payer: BLUE CROSS/BLUE SHIELD | Source: Ambulatory Visit | Attending: Radiation Oncology | Admitting: Radiation Oncology

## 2016-06-22 DIAGNOSIS — Z510 Encounter for antineoplastic radiation therapy: Secondary | ICD-10-CM | POA: Diagnosis not present

## 2016-06-23 ENCOUNTER — Ambulatory Visit
Admission: RE | Admit: 2016-06-23 | Discharge: 2016-06-23 | Disposition: A | Discharge status: Home / Self Care | Payer: BLUE CROSS/BLUE SHIELD | Source: Ambulatory Visit | Attending: Radiation Oncology | Admitting: Radiation Oncology

## 2016-06-23 ENCOUNTER — Encounter: Payer: Self-pay | Admitting: Radiation Oncology

## 2016-06-23 VITALS — BP 97/61 | HR 67 | Temp 98.6°F | Resp 16 | Wt 153.9 lb

## 2016-06-23 DIAGNOSIS — C50411 Malignant neoplasm of upper-outer quadrant of right female breast: Secondary | ICD-10-CM

## 2016-06-23 DIAGNOSIS — Z510 Encounter for antineoplastic radiation therapy: Secondary | ICD-10-CM | POA: Diagnosis not present

## 2016-06-23 NOTE — Progress Notes (Addendum)
weekly rad txs,  Right breast 21/33 completed, mild tanning, skin intact, using coconut oil  At night after showering,  appetite good, waiting on lymphedema sleeve, wearing  A different sleeve on arm, pain in right arm 9:12 AM .BP 97/61 mmHg  Pulse 67  Temp(Src) 98.6 F (37 C)  Resp 16  Wt 153 lb 14.4 oz (69.809 kg)  Wt Readings from Last 3 Encounters:  06/23/16 153 lb 14.4 oz (69.809 kg)  06/15/16 154 lb 3.2 oz (69.945 kg)  06/08/16 154 lb 3.2 oz (69.945 kg)

## 2016-06-24 ENCOUNTER — Encounter: Payer: Self-pay | Admitting: Radiation Oncology

## 2016-06-24 NOTE — Progress Notes (Signed)
   Department of Radiation Oncology  Phone:  [phone] Fax:        [phone]  Weekly Treatment Note    Name: Sandra James Date: 06/24/2016 MRN: [id number] DOB: Aug 03, 1966   Diagnosis:     ICD-9-CM ICD-10-CM   1. Breast cancer of upper-outer quadrant of right female breast ([place]) 174.4 C50.411      Current dose: 37.8 Gy  Current fraction: 21   MEDICATIONS: Current Outpatient Prescriptions  Medication Sig Dispense Refill  . bisoprolol (ZEBETA) 5 MG tablet Take 0.5 tablets (2.5 mg total) by mouth at bedtime. 15 tablet 3  . fluticasone (FLONASE) 50 MCG/ACT nasal spray Place 1 spray into both nostrils daily as needed for allergies.   0  . hyaluronate sodium (RADIAPLEXRX) GEL Apply 1 application topically 2 (two) times daily.    Marland Kitchen ibuprofen (ADVIL,MOTRIN) 800 MG tablet Take 800 mg by mouth daily as needed for headache or moderate pain.    . Iron-Vitamins (GERITOL) LIQD Take 30 mLs by mouth daily. Energy support    . losartan (COZAAR) 25 MG tablet Take 0.5 tablets (12.5 mg total) by mouth daily. 90 tablet 3  . mirtazapine (REMERON) 30 MG tablet Take 30 mg by mouth as needed. Reported on 03/30/2016  3  . Multiple Vitamin (MULTIVITAMIN) tablet Take 1 tablet by mouth daily.    . non-metallic deodorant Jethro Poling) MISC Apply 1 application topically daily as needed.    . polyethylene glycol (MIRALAX / GLYCOLAX) packet Take 17 g by mouth daily as needed for mild constipation. 14 each 0  . zolpidem (AMBIEN) 10 MG tablet Take 1 tablet (10 mg total) by mouth at bedtime as needed for sleep. 30 tablet 2   No current facility-administered medications for this encounter.     ALLERGIES: Lisinopril; Oxycodone; and Percocet   LABORATORY DATA:  Lab Results  Component Value Date   WBC 5.0 06/08/2016   HGB 12.0 06/08/2016   HCT 36.0 06/08/2016   MCV 96.3 06/08/2016   PLT 245 06/08/2016   Lab Results  Component Value Date   NA 140 06/08/2016   K 3.7 06/08/2016   CL 103 03/12/2016   CO2 27 06/08/2016   Lab Results  Component Value Date   ALT 16 06/08/2016   AST 17 06/08/2016   ALKPHOS 77 06/08/2016   BILITOT 0.49 06/08/2016     NARRATIVE: Aideen Zehring was seen today for weekly treatment management. The chart was checked and the patient's films were reviewed.  weekly rad txs,  Right breast 21/33 completed, mild tanning, skin intact, using coconut oil  At night after showering,  appetite good, waiting on lymphedema sleeve, wearing  A different sleeve on arm, pain in right arm 8:16 PM .BP 97/61 mmHg  Pulse 67  Temp(Src) 98.6 F (37 C)  Resp 16  Wt 153 lb 14.4 oz (69.809 kg)  Wt Readings from Last 3 Encounters:  06/23/16 153 lb 14.4 oz (69.809 kg)  06/15/16 154 lb 3.2 oz (69.945 kg)  06/08/16 154 lb 3.2 oz (69.945 kg)    PHYSICAL EXAMINATION: weight is 153 lb 14.4 oz (69.809 kg). Her temperature is 98.6 F (37 C). Her blood pressure is 97/61 and her pulse is 67. Her respiration is 16.        ASSESSMENT: The patient is doing satisfactorily with treatment.  PLAN: We will continue with the patient's radiation treatment as planned.

## 2016-06-26 ENCOUNTER — Ambulatory Visit
Admission: RE | Admit: 2016-06-26 | Discharge: 2016-06-26 | Disposition: A | Discharge status: Home / Self Care | Payer: BLUE CROSS/BLUE SHIELD | Source: Ambulatory Visit | Attending: Radiation Oncology | Admitting: Radiation Oncology

## 2016-06-26 DIAGNOSIS — Z510 Encounter for antineoplastic radiation therapy: Secondary | ICD-10-CM | POA: Diagnosis not present

## 2016-06-27 ENCOUNTER — Ambulatory Visit
Admission: RE | Admit: 2016-06-27 | Discharge: 2016-06-27 | Disposition: A | Discharge status: Home / Self Care | Payer: BLUE CROSS/BLUE SHIELD | Source: Ambulatory Visit | Attending: Radiation Oncology | Admitting: Radiation Oncology

## 2016-06-27 ENCOUNTER — Telehealth: Payer: Self-pay | Admitting: *Deleted

## 2016-06-27 DIAGNOSIS — Z510 Encounter for antineoplastic radiation therapy: Secondary | ICD-10-CM | POA: Diagnosis not present

## 2016-06-27 NOTE — Telephone Encounter (Signed)
Patient called stating she is having a lot of pain in her breast, where her implants are, they are tight ,,  She isn't sure what to take a muscle relaxer or what?", informed her I would speak with our PA Bryson Ha when she gets back, she does have Ibuprofen prn, and will take that, she also asked if she could use an ice pack, advised no  at least not until I can speak with Bryson Ha,  , she is awaiting call back 12:54 PM

## 2016-06-27 NOTE — Telephone Encounter (Signed)
Left vm for Sandra James  to take ibuprofen q 8hours and will be seen before  tx tomorrow on the lianc table 1:27 PM

## 2016-06-28 ENCOUNTER — Ambulatory Visit: Payer: BLUE CROSS/BLUE SHIELD | Admitting: Radiation Oncology

## 2016-06-28 ENCOUNTER — Other Ambulatory Visit: Payer: Self-pay | Admitting: Radiation Oncology

## 2016-06-28 ENCOUNTER — Ambulatory Visit
Admission: RE | Admit: 2016-06-28 | Discharge: 2016-06-28 | Disposition: A | Discharge status: Home / Self Care | Payer: BLUE CROSS/BLUE SHIELD | Source: Ambulatory Visit | Attending: Radiation Oncology | Admitting: Radiation Oncology

## 2016-06-28 ENCOUNTER — Encounter: Payer: Self-pay | Admitting: Radiation Oncology

## 2016-06-28 DIAGNOSIS — C50411 Malignant neoplasm of upper-outer quadrant of right female breast: Secondary | ICD-10-CM

## 2016-06-28 DIAGNOSIS — Z510 Encounter for antineoplastic radiation therapy: Secondary | ICD-10-CM | POA: Diagnosis not present

## 2016-06-28 MED ORDER — TRAMADOL HCL 50 MG PO TABS: 50.0000 mg | ORAL_TABLET | Freq: Four times a day (QID) | ORAL | Status: DC | PRN

## 2016-06-28 MED ORDER — SONAFINE EX EMUL
1.0000 "application " | Freq: Two times a day (BID) | CUTANEOUS | Status: DC
  Administered 2016-06-28: 1 via TOPICAL

## 2016-06-28 MED ORDER — HYDROCODONE-ACETAMINOPHEN 5-325 MG PO TABS: 1.0000 | ORAL_TABLET | Freq: Four times a day (QID) | ORAL | Status: DC | PRN

## 2016-06-28 NOTE — Progress Notes (Addendum)
Spoke with the pateint in hall, before rad tx today, she stated ibuprofen heloped some, but the cool washcloth helped a lot, but her right breast is tight and getting hard again, she doesn't massage them, will page Bryson Ha aon linac tablke, skin looks good,did c/o itching more, gave sonafine cream to start using 10:04 AM

## 2016-06-28 NOTE — Progress Notes (Signed)
Mrs. Arbutus Ped was seen in the Today because of concerns with edema of her right breast and chest wall. The patient's history is significant for bilateral breast cancer with her most recent diagnosis of right breast cancer treated with mastectomy and tissue expander placement who is completed therapy is currently undergoing radiotherapy. She has less than 6 treatments to complete her course. She developed acute onset of pain in her left breast yesterday, and was encouraged to use cool compresses and ibuprofen. In examining the patient, there is notable for edema from the dorsal aspect of her hand extending cephalad to the level of the shoulder involving the chest wall inferior to the right axilla. The chest wall is assessed and her tissue expanders in place. The chest wall is notable for some edema which does show some asymmetry. No evidence of erythema, cellulitic streaking or bleeding is noted. No nipples are placed at this time and her mastectomy scars bilaterally are well-healed. No evidence of any edema of the neck is present. I've discussed the patient's symptoms and reiterated to her that I'm concerned that she is developed lymphedema. She has a compression sleeve that she reports she has ordered and is waiting to receive by female, we discussed and timely referral to physical therapy for management of this which she declines, I did stress the importance of the timing of her being evaluated as kidney, permanent deficit following she states agreement and understanding of this but is still interested in holding off on this until she completes her radiation. For the discomfort she is feeling from the final her prescription given her a prescription for Ultram to be taken 1-2 tablets at a time every 4-6 hours for pain as she has been told by her cardiologist to avoid long-term use of NSAIDs. 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 I provided a note stating that she should avoid this particular the role in her job and has suggested other forms of activity for her that would be appropriate so that she may maintaining her hours and her employment.      Carola Rhine, PAC

## 2016-06-29 ENCOUNTER — Ambulatory Visit
Admission: RE | Admit: 2016-06-29 | Discharge: 2016-06-29 | Disposition: A | Discharge status: Home / Self Care | Payer: BLUE CROSS/BLUE SHIELD | Source: Ambulatory Visit | Attending: Radiation Oncology | Admitting: Radiation Oncology

## 2016-06-29 ENCOUNTER — Ambulatory Visit: Payer: BLUE CROSS/BLUE SHIELD

## 2016-06-29 ENCOUNTER — Other Ambulatory Visit (HOSPITAL_BASED_OUTPATIENT_CLINIC_OR_DEPARTMENT_OTHER): Payer: BLUE CROSS/BLUE SHIELD

## 2016-06-29 ENCOUNTER — Ambulatory Visit (HOSPITAL_BASED_OUTPATIENT_CLINIC_OR_DEPARTMENT_OTHER): Payer: BLUE CROSS/BLUE SHIELD

## 2016-06-29 VITALS — BP 91/66 | HR 68 | Temp 98.2°F | Resp 17

## 2016-06-29 DIAGNOSIS — Z5112 Encounter for antineoplastic immunotherapy: Secondary | ICD-10-CM

## 2016-06-29 DIAGNOSIS — C50411 Malignant neoplasm of upper-outer quadrant of right female breast: Secondary | ICD-10-CM

## 2016-06-29 DIAGNOSIS — Z853 Personal history of malignant neoplasm of breast: Secondary | ICD-10-CM

## 2016-06-29 DIAGNOSIS — Z510 Encounter for antineoplastic radiation therapy: Secondary | ICD-10-CM | POA: Diagnosis not present

## 2016-06-29 DIAGNOSIS — Z95828 Presence of other vascular implants and grafts: Secondary | ICD-10-CM

## 2016-06-29 LAB — CBC WITH DIFFERENTIAL/PLATELET
BASO%: 0.6 % (ref 0.0–2.0)
BASOS ABS: 0 10*3/uL (ref 0.0–0.1)
EOS%: 2.2 % (ref 0.0–7.0)
Eosinophils Absolute: 0.1 10*3/uL (ref 0.0–0.5)
HCT: 34.8 % (ref 34.8–46.6)
HEMOGLOBIN: 11.6 g/dL (ref 11.6–15.9)
LYMPH%: 17.5 % (ref 14.0–49.7)
MCH: 32.2 pg (ref 25.1–34.0)
MCHC: 33.3 g/dL (ref 31.5–36.0)
MCV: 96.7 fL (ref 79.5–101.0)
MONO#: 0.4 10*3/uL (ref 0.1–0.9)
MONO%: 8.8 % (ref 0.0–14.0)
NEUT#: 3 10*3/uL (ref 1.5–6.5)
NEUT%: 70.9 % (ref 38.4–76.8)
Platelets: 207 10*3/uL (ref 145–400)
RBC: 3.6 10*6/uL — ABNORMAL LOW (ref 3.70–5.45)
RDW: 13.6 % (ref 11.2–14.5)
WBC: 4.3 10*3/uL (ref 3.9–10.3)
lymph#: 0.7 10*3/uL — ABNORMAL LOW (ref 0.9–3.3)

## 2016-06-29 LAB — COMPREHENSIVE METABOLIC PANEL
ALBUMIN: 4 g/dL (ref 3.5–5.0)
ALK PHOS: 63 U/L (ref 40–150)
ALT: 13 U/L (ref 0–55)
AST: 15 U/L (ref 5–34)
Anion Gap: 9 mEq/L (ref 3–11)
BUN: 12.6 mg/dL (ref 7.0–26.0)
CHLORIDE: 105 meq/L (ref 98–109)
CO2: 26 mEq/L (ref 22–29)
CREATININE: 0.7 mg/dL (ref 0.6–1.1)
Calcium: 9.6 mg/dL (ref 8.4–10.4)
EGFR: >90 mL/min/{1.73_m2} (ref >90)
GLUCOSE: 97 mg/dL (ref 70–140)
POTASSIUM: 3.6 meq/L (ref 3.5–5.1)
SODIUM: 140 meq/L (ref 136–145)
Total Bilirubin: 0.99 mg/dL (ref 0.20–1.20)
Total Protein: 7.6 g/dL (ref 6.4–8.3)

## 2016-06-29 MED ORDER — SODIUM CHLORIDE 0.9 % IV SOLN
Freq: Once | INTRAVENOUS | Status: AC
  Administered 2016-06-29: 10:00:00 via INTRAVENOUS

## 2016-06-29 MED ORDER — TRASTUZUMAB CHEMO 150 MG IV SOLR
6.0000 mg/kg | Freq: Once | INTRAVENOUS | Status: AC
  Administered 2016-06-29: 420 mg via INTRAVENOUS
  Filled 2016-06-29: qty 20

## 2016-06-29 MED ORDER — SODIUM CHLORIDE 0.9% FLUSH
10.0000 mL | INTRAVENOUS | Status: DC | PRN
  Administered 2016-06-29: 10 mL via INTRAVENOUS
  Filled 2016-06-29: qty 10

## 2016-06-29 MED ORDER — DIPHENHYDRAMINE HCL 25 MG PO CAPS
ORAL_CAPSULE | ORAL | Status: AC
  Filled 2016-06-29: qty 2

## 2016-06-29 MED ORDER — ACETAMINOPHEN 325 MG PO TABS
ORAL_TABLET | ORAL | Status: AC
  Filled 2016-06-29: qty 2

## 2016-06-29 MED ORDER — HEPARIN SOD (PORK) LOCK FLUSH 100 UNIT/ML IV SOLN
500.0000 [IU] | Freq: Once | INTRAVENOUS | Status: AC | PRN
  Administered 2016-06-29: 500 [IU]
  Filled 2016-06-29: qty 5

## 2016-06-29 MED ORDER — SODIUM CHLORIDE 0.9% FLUSH
10.0000 mL | INTRAVENOUS | Status: DC | PRN
  Administered 2016-06-29: 10 mL
  Filled 2016-06-29: qty 10

## 2016-06-29 MED ORDER — ACETAMINOPHEN 325 MG PO TABS
650.0000 mg | ORAL_TABLET | Freq: Once | ORAL | Status: AC
  Administered 2016-06-29: 650 mg via ORAL

## 2016-06-29 MED ORDER — DIPHENHYDRAMINE HCL 25 MG PO CAPS
50.0000 mg | ORAL_CAPSULE | Freq: Once | ORAL | Status: AC
Start: 2016-06-29 — End: 2016-06-29
  Administered 2016-06-29: 50 mg via ORAL

## 2016-06-29 NOTE — Patient Instructions (Signed)
[facility] Discharge Instructions for Patients Receiving Chemotherapy  Today you received the following chemotherapy agents:  Herceptin  To help prevent nausea and vomiting after your treatment, we encourage you to take your nausea medication as prescribed.   If you develop nausea and vomiting that is not controlled by your nausea medication, call the clinic.   BELOW ARE SYMPTOMS THAT SHOULD BE REPORTED IMMEDIATELY:  *FEVER GREATER THAN 100.5 F  *CHILLS WITH OR WITHOUT FEVER  NAUSEA AND VOMITING THAT IS NOT CONTROLLED WITH YOUR NAUSEA MEDICATION  *UNUSUAL SHORTNESS OF BREATH  *UNUSUAL BRUISING OR BLEEDING  TENDERNESS IN MOUTH AND THROAT WITH OR WITHOUT PRESENCE OF ULCERS  *URINARY PROBLEMS  *BOWEL PROBLEMS  UNUSUAL RASH Items with * indicate a potential emergency and should be followed up as soon as possible.  Feel free to call the clinic you have any questions or concerns. The clinic phone number is [phone].  Please show the CHEMO ALERT CARD at check-in to the Emergency Department and triage nurse.   

## 2016-06-30 ENCOUNTER — Ambulatory Visit
Admission: RE | Admit: 2016-06-30 | Discharge: 2016-06-30 | Disposition: A | Discharge status: Home / Self Care | Payer: BLUE CROSS/BLUE SHIELD | Source: Ambulatory Visit | Attending: Radiation Oncology | Admitting: Radiation Oncology

## 2016-06-30 ENCOUNTER — Ambulatory Visit: Payer: BLUE CROSS/BLUE SHIELD | Admitting: Radiation Oncology

## 2016-06-30 ENCOUNTER — Encounter: Payer: Self-pay | Admitting: Radiation Oncology

## 2016-06-30 DIAGNOSIS — C50411 Malignant neoplasm of upper-outer quadrant of right female breast: Secondary | ICD-10-CM

## 2016-06-30 DIAGNOSIS — Z510 Encounter for antineoplastic radiation therapy: Secondary | ICD-10-CM | POA: Diagnosis not present

## 2016-06-30 NOTE — Progress Notes (Signed)
Patient seen in the back treatment area  By MD  Not sent to nursing for assessment 8:39 AM

## 2016-07-01 NOTE — Progress Notes (Signed)
  Radiation Oncology         [phone] ________________________________  Name: Sandra James MRN: [id number]  Date: 05/17/2016  DOB: 13-Feb-1966  Optical Surface Tracking Plan:  Since intensity modulated radiotherapy (IMRT) and 3D conformal radiation treatment methods are predicated on accurate and precise positioning for treatment, intrafraction motion monitoring is medically necessary to ensure accurate and safe treatment delivery.  The ability to quantify intrafraction motion without excessive ionizing radiation dose can only be performed with optical surface tracking. Accordingly, surface imaging offers the opportunity to obtain 3D measurements of patient position throughout IMRT and 3D treatments without excessive radiation exposure.  I am ordering optical surface tracking for this patient's upcoming course of radiotherapy. ________________________________  Kyung Rudd, MD 07/01/2016 1:25 PM    Reference:   Ursula Alert, J, et al. Surface imaging-based analysis of intrafraction motion for breast radiotherapy patients.Journal of [place], n. 6, nov. 2014. ISSN [id number].   Available at: <http://www.jacmp.org/index.php/jacmp/article/view/4957>.

## 2016-07-01 NOTE — Addendum Note (Signed)
Encounter addended by: Kyung Rudd, MD on: 07/01/2016  1:25 PM<BR>     Documentation filed: Notes Section, Visit Diagnoses

## 2016-07-01 NOTE — Progress Notes (Signed)
  Radiation Oncology         [phone] ________________________________  Name: Sandra James MRN: [id number]  Date: 05/17/2016  DOB: [birth year]  DIAGNOSIS:     ICD-9-CM ICD-10-CM   1. Breast cancer of upper-outer quadrant of right female breast ([place]) 174.4 C50.411      SIMULATION AND TREATMENT PLANNING NOTE  The patient presented for simulation prior to beginning her course of radiation treatment for her diagnosis of right-sided breast cancer. The patient was placed in a supine position on a breast board. A customized vac-lock bag was also constructed and this complex treatment device will be used on a daily basis during her treatment. In this fashion, a CT scan was obtained through the chest area and an isocenter was placed near the chest wall at the upper aspect of the right chest.  The patient will be planned to receive a course of radiation initially to a dose of 50.4 gray. This will consist of a 4 field technique targeting the right chest wall as well as the supraclavicular region. Therefore 2 customized medial and lateral tangent fields have been created targeting the chest wall, and also 2 additional customized fields have been designed to treat the supraclavicular region both with a right supraclavicular field and a right posterior axillary boost field. A forward planning/reduced field technique will also be evaluated to determine if this significantly improves the dose homogeneity of the overall plan. Therefore, additional customized blocks/fields may be necessary.  This initial treatment will be accomplished at 1.8 gray per fraction.   The initial plan will consist of a 3-D conformal technique. The target volume/scar, heart and lungs have been contoured and dose volume histograms of each of these structures will be evaluated as part of the 3-D conformal treatment planning process.   It is anticipated that the patient will then receive a 10 gray boost to the surgical scar. This will be  accomplished at 2 gray per fraction. The final anticipated total dose therefore will correspond to 60.4 gray.    _______________________________   Jodelle Gross, MD, PhD

## 2016-07-01 NOTE — Progress Notes (Signed)
   Department of Radiation Oncology  Phone:  [phone] Fax:        [phone]  Weekly Treatment Note    Name: Sandra James Date: 07/01/2016 MRN: [id number] DOB: 01-Mar-1966   Diagnosis:     ICD-9-CM ICD-10-CM   1. Breast cancer of upper-outer quadrant of right female breast ([place]) 174.4 C50.411      Current dose: 46.8 Gy  Current fraction: 26   MEDICATIONS: Current Outpatient Prescriptions  Medication Sig Dispense Refill  . bisoprolol (ZEBETA) 5 MG tablet Take 0.5 tablets (2.5 mg total) by mouth at bedtime. 15 tablet 3  . fluticasone (FLONASE) 50 MCG/ACT nasal spray Place 1 spray into both nostrils daily as needed for allergies.   0  . hyaluronate sodium (RADIAPLEXRX) GEL Apply 1 application topically 2 (two) times daily.    Marland Kitchen ibuprofen (ADVIL,MOTRIN) 800 MG tablet Take 800 mg by mouth daily as needed for headache or moderate pain.    . Iron-Vitamins (GERITOL) LIQD Take 30 mLs by mouth daily. Energy support    . losartan (COZAAR) 25 MG tablet Take 0.5 tablets (12.5 mg total) by mouth daily. 90 tablet 3  . mirtazapine (REMERON) 30 MG tablet Take 30 mg by mouth as needed. Reported on 03/30/2016  3  . Multiple Vitamin (MULTIVITAMIN) tablet Take 1 tablet by mouth daily.    . non-metallic deodorant Jethro Poling) MISC Apply 1 application topically daily as needed.    . polyethylene glycol (MIRALAX / GLYCOLAX) packet Take 17 g by mouth daily as needed for mild constipation. 14 each 0  . traMADol (ULTRAM) 50 MG tablet Take 1-2 tablets (50-100 mg total) by mouth every 6 (six) hours as needed. 60 tablet 0  . Wound Dressings (SONAFINE) Apply 1 application topically 2 (two) times daily.    Marland Kitchen zolpidem (AMBIEN) 10 MG tablet Take 1 tablet (10 mg total) by mouth at bedtime as needed for sleep. 30 tablet 2   No current facility-administered medications for this encounter.     ALLERGIES: Lisinopril; Oxycodone; and Percocet   LABORATORY DATA:  Lab Results  Component Value Date   WBC 4.3  06/29/2016   HGB 11.6 06/29/2016   HCT 34.8 06/29/2016   MCV 96.7 06/29/2016   PLT 207 06/29/2016   Lab Results  Component Value Date   NA 140 06/29/2016   K 3.6 06/29/2016   CL 103 03/12/2016   CO2 26 06/29/2016   Lab Results  Component Value Date   ALT 13 06/29/2016   AST 15 06/29/2016   ALKPHOS 63 06/29/2016   BILITOT 0.99 06/29/2016     NARRATIVE: Sandra James was seen today for weekly treatment management. The chart was checked and the patient's films were reviewed.  The patient states that she is doing well. She does have some lymphedema and wishes to see folks in the lymphedema clinic after her radiation treatment has been completed. She states that she cannot schedule this during her course of radiation treatment. She does understand the importance of seeing them as soon as possible once we complete therefore.  PHYSICAL EXAMINATION: vitals were not taken for this visit.     The patient's skin looks quite good. Her boost treatment was set up today in the boost area looks quite good without any desquamation.  ASSESSMENT: The patient is doing satisfactorily with treatment.  PLAN: We will continue with the patient's radiation treatment as planned.

## 2016-07-01 NOTE — Progress Notes (Signed)
  Radiation Oncology         [phone] ________________________________  Name: Sandra James MRN: [id number]  Date: 06/30/2016  DOB: 06-27-1966  Complex simulation note  The patient has undergone complex simulation for her upcoming boost treatment for her diagnosis of breast cancer. The patient has initially been planned to receive 50.4 Gy to the chest wall. The patient will now receive a 10 Gy boost to the mastectomy scar which has been identified. This will be accomplished using an en face electron field. Based on the depth of the target area, 6 MeV electrons will be used. The patient's final total dose therefore will be 60.4 Gy. A special port plan is requested for the boost treatment.   _______________________________  Jodelle Gross, MD, PhD

## 2016-07-03 ENCOUNTER — Ambulatory Visit
Admission: RE | Admit: 2016-07-03 | Discharge: 2016-07-03 | Disposition: A | Discharge status: Home / Self Care | Payer: BLUE CROSS/BLUE SHIELD | Source: Ambulatory Visit | Attending: Radiation Oncology | Admitting: Radiation Oncology

## 2016-07-03 DIAGNOSIS — Z510 Encounter for antineoplastic radiation therapy: Secondary | ICD-10-CM | POA: Diagnosis not present

## 2016-07-04 ENCOUNTER — Ambulatory Visit
Admission: RE | Admit: 2016-07-04 | Discharge: 2016-07-04 | Disposition: A | Discharge status: Home / Self Care | Payer: BLUE CROSS/BLUE SHIELD | Source: Ambulatory Visit | Attending: Radiation Oncology | Admitting: Radiation Oncology

## 2016-07-04 DIAGNOSIS — Z510 Encounter for antineoplastic radiation therapy: Secondary | ICD-10-CM | POA: Diagnosis not present

## 2016-07-05 ENCOUNTER — Ambulatory Visit
Admission: RE | Admit: 2016-07-05 | Discharge: 2016-07-05 | Disposition: A | Discharge status: Home / Self Care | Payer: BLUE CROSS/BLUE SHIELD | Source: Ambulatory Visit | Attending: Radiation Oncology | Admitting: Radiation Oncology

## 2016-07-05 DIAGNOSIS — Z510 Encounter for antineoplastic radiation therapy: Secondary | ICD-10-CM | POA: Diagnosis not present

## 2016-07-06 ENCOUNTER — Ambulatory Visit
Admission: RE | Admit: 2016-07-06 | Discharge: 2016-07-06 | Disposition: A | Discharge status: Home / Self Care | Payer: BLUE CROSS/BLUE SHIELD | Source: Ambulatory Visit | Attending: Radiation Oncology | Admitting: Radiation Oncology

## 2016-07-06 DIAGNOSIS — Z510 Encounter for antineoplastic radiation therapy: Secondary | ICD-10-CM | POA: Diagnosis not present

## 2016-07-06 NOTE — Progress Notes (Signed)
Department of Radiation Oncology  Phone:  [phone] Fax:        [phone]  Weekly Treatment Note    Name: Sandra James Date: 07/07/2016 MRN: [id number] DOB: [birth year]   Diagnosis:     ICD-9-CM ICD-10-CM   1. Breast cancer of upper-outer quadrant of right female breast ([place]) 174.4 C50.411      Current dose: 55.8 Gy  Current fraction: 26   MEDICATIONS: Current Outpatient Prescriptions  Medication Sig Dispense Refill  . bisoprolol (ZEBETA) 5 MG tablet Take 0.5 tablets (2.5 mg total) by mouth at bedtime. 15 tablet 3  . fluticasone (FLONASE) 50 MCG/ACT nasal spray Place 1 spray into both nostrils daily as needed for allergies.   0  . ibuprofen (ADVIL,MOTRIN) 800 MG tablet Take 800 mg by mouth daily as needed for headache or moderate pain.    . Iron-Vitamins (GERITOL) LIQD Take 30 mLs by mouth daily. Energy support    . losartan (COZAAR) 25 MG tablet Take 0.5 tablets (12.5 mg total) by mouth daily. 90 tablet 3  . traMADol (ULTRAM) 50 MG tablet Take 1-2 tablets (50-100 mg total) by mouth every 6 (six) hours as needed. 60 tablet 0  . zolpidem (AMBIEN) 10 MG tablet Take 1 tablet (10 mg total) by mouth at bedtime as needed for sleep. 30 tablet 2  . hyaluronate sodium (RADIAPLEXRX) GEL Apply 1 application topically 2 (two) times daily.    . mirtazapine (REMERON) 30 MG tablet Take 30 mg by mouth as needed. Reported on 03/30/2016  3  . Multiple Vitamin (MULTIVITAMIN) tablet Take 1 tablet by mouth daily.    . non-metallic deodorant Sandra James) MISC Apply 1 application topically daily as needed.    . Wound Dressings (SONAFINE) Apply 1 application topically 2 (two) times daily.     No current facility-administered medications for this encounter.      ALLERGIES: Lisinopril; Oxycodone; and Percocet [oxycodone-acetaminophen]   LABORATORY DATA:  Lab Results  Component Value Date   WBC 4.3 06/29/2016   HGB 11.6 06/29/2016   HCT 34.8 06/29/2016   MCV 96.7 06/29/2016   PLT 207  06/29/2016   Lab Results  Component Value Date   NA 140 06/29/2016   K 3.6 06/29/2016   CL 103 03/12/2016   CO2 26 06/29/2016   Lab Results  Component Value Date   ALT 13 06/29/2016   AST 15 06/29/2016   ALKPHOS 63 06/29/2016   BILITOT 0.99 06/29/2016     NARRATIVE: Sandra James was seen today for weekly treatment management. The chart was checked and the patient's films were reviewed.  Sandra James has completed 31 fractions to her right chest wall and subclavian area.  She denies having any pain.  She has been taking tramadol and says it has been making her itch all over.  She reports having fatigue "in waves" during the day.  She reports she has a poor appetite.  The skin on her right breast and subclavian area has hyperpigmentation.  She is using coconut oil.  Her bp was low today at 93/63.  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    PHYSICAL EXAMINATION: height is 5\' 6"  (1.676 m) and weight is 148 lb 12.8 oz (67.5 kg). Her oral temperature is 97.9 F (36.6 C). Her blood pressure is 93/63 and her pulse is 74. Her oxygen saturation is 100%.      The patient's skin looks quite good. Her  boost treatment was set up today in the boost area looks quite good without any desquamation.  ASSESSMENT: The patient has completed treatment.   PLAN: The patient will see Dr. Lisbeth Renshaw in one month   ------------------------------------------------   Tyler Pita, MD [place] Director and Director of Stereotactic Radiosurgery Direct Dial: [phone]  Fax: [phone] [email or web address]  Skype  [email or web address]       This document serves as a record of services personally performed by Tyler Pita, MD. It was created on his behalf by Truddie Hidden, a trained medical scribe. The creation of this record is based on the scribe's personal observations and the provider's statements to them.  This document has been checked and approved by the attending provider.

## 2016-07-07 ENCOUNTER — Encounter: Payer: Self-pay | Admitting: Radiation Oncology

## 2016-07-07 ENCOUNTER — Ambulatory Visit
Admission: RE | Admit: 2016-07-07 | Discharge: 2016-07-07 | Disposition: A | Discharge status: Home / Self Care | Payer: BLUE CROSS/BLUE SHIELD | Source: Ambulatory Visit | Attending: Radiation Oncology | Admitting: Radiation Oncology

## 2016-07-07 VITALS — BP 93/63 | HR 74 | Temp 97.9°F | Ht 66.0 in | Wt 148.8 lb

## 2016-07-07 DIAGNOSIS — Z510 Encounter for antineoplastic radiation therapy: Secondary | ICD-10-CM | POA: Diagnosis not present

## 2016-07-07 DIAGNOSIS — C50411 Malignant neoplasm of upper-outer quadrant of right female breast: Secondary | ICD-10-CM

## 2016-07-07 NOTE — Progress Notes (Signed)
Sandra James has completed 31 fractions to her right chest wall and subclavian area.  She denies having any pain.  She has been taking tramadol and says it has been making her itch all over.  She reports having fatigue "in waves" during the day.  She reports she has a poor appetite.  The skin on her right breast and subclavian area has hyperpigmentation.  She is using coconut oil.  Her bp was low today at 93/63.  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  BP 93/63 (BP Location: Left Arm, Patient Position: Sitting)   Pulse 74   Temp 97.9 F (36.6 C) (Oral)   Ht 5\' 6"  (1.676 m)   Wt 148 lb 12.8 oz (67.5 kg)   SpO2 100%   BMI 24.02 kg/m    Wt Readings from Last 3 Encounters:  07/07/16 148 lb 12.8 oz (67.5 kg)  06/23/16 153 lb 14.4 oz (69.8 kg)  06/15/16 154 lb 3.2 oz (69.9 kg)

## 2016-07-10 ENCOUNTER — Ambulatory Visit
Admission: RE | Admit: 2016-07-10 | Discharge: 2016-07-10 | Disposition: A | Discharge status: Home / Self Care | Payer: BLUE CROSS/BLUE SHIELD | Source: Ambulatory Visit | Attending: Radiation Oncology | Admitting: Radiation Oncology

## 2016-07-10 DIAGNOSIS — Z510 Encounter for antineoplastic radiation therapy: Secondary | ICD-10-CM | POA: Diagnosis not present

## 2016-07-11 ENCOUNTER — Ambulatory Visit
Admission: RE | Admit: 2016-07-11 | Discharge: 2016-07-11 | Disposition: A | Discharge status: Home / Self Care | Payer: BLUE CROSS/BLUE SHIELD | Source: Ambulatory Visit | Attending: Radiation Oncology | Admitting: Radiation Oncology

## 2016-07-11 ENCOUNTER — Encounter: Payer: Self-pay | Admitting: Radiation Oncology

## 2016-07-11 DIAGNOSIS — Z510 Encounter for antineoplastic radiation therapy: Secondary | ICD-10-CM | POA: Diagnosis not present

## 2016-07-12 ENCOUNTER — Telehealth: Payer: Self-pay | Admitting: *Deleted

## 2016-07-12 NOTE — Telephone Encounter (Signed)
  Oncology Nurse Navigator Documentation    Navigator Encounter Type: Telephone (Left vm to congratulate on completion of xrt) (07/12/16 1100) Telephone: Outgoing Call (07/12/16 1100)         Patient Visit Type: [id number] (07/12/16 1100) Treatment Phase: Final Radiation Tx (07/12/16 1100)                            Time Spent with Patient: 15 (07/12/16 1100)

## 2016-07-17 ENCOUNTER — Ambulatory Visit: Payer: BLUE CROSS/BLUE SHIELD | Admitting: Physical Therapy

## 2016-07-17 ENCOUNTER — Telehealth: Payer: Self-pay | Admitting: *Deleted

## 2016-07-17 MED FILL — ZOLPIDEM TARTRATE 10 MG TAB: 10 | 30 days supply | Qty: 30 | Fill #2

## 2016-07-17 NOTE — Telephone Encounter (Signed)
Patient called to report that she is having trouble sleeping and she is not eating.  Called patient back and spoke with her.  She has not had much appetite for a week and she wonders if it is due to the radiation.  She also is not sleeping.  Asked her if she is taking anything for sleep.  She reports she was using [place], but she does not have anymore.  Checked her med list.  She was given #30 in June with 2 refills (taking one a night), so she should have a refill on that.  She will contact her pharmacy.   Patient reports she is able to drink sufficiently.  Let her know how to look at her urine and check for dehydration.  She already has an appt. To see Dr. Burr Medico this Thursday.  Offered for her to come in today our symptom management clinic and see the NP.  She prefers to wait and see Dr. Burr Medico on Thursday, but assures me she will call before then if needed.  Patient does like smoothies.  Suggested that she might try something like that and also suggested that someone else do the food prep (olike her roommate), she may have a better appetite if she is not involved in the food prep.  She appreciated these suggestions and will see [place] on Thursday if not sooner.

## 2016-07-19 ENCOUNTER — Ambulatory Visit: Payer: BLUE CROSS/BLUE SHIELD | Attending: Radiation Oncology | Admitting: Physical Therapy

## 2016-07-20 ENCOUNTER — Ambulatory Visit (HOSPITAL_BASED_OUTPATIENT_CLINIC_OR_DEPARTMENT_OTHER): Payer: BLUE CROSS/BLUE SHIELD

## 2016-07-20 ENCOUNTER — Encounter: Payer: Self-pay | Admitting: Hematology

## 2016-07-20 ENCOUNTER — Other Ambulatory Visit (HOSPITAL_BASED_OUTPATIENT_CLINIC_OR_DEPARTMENT_OTHER): Payer: BLUE CROSS/BLUE SHIELD

## 2016-07-20 ENCOUNTER — Ambulatory Visit: Payer: BLUE CROSS/BLUE SHIELD

## 2016-07-20 ENCOUNTER — Ambulatory Visit (HOSPITAL_BASED_OUTPATIENT_CLINIC_OR_DEPARTMENT_OTHER): Payer: BLUE CROSS/BLUE SHIELD | Admitting: Hematology

## 2016-07-20 VITALS — BP 114/76 | HR 80 | Temp 98.1°F | Resp 18 | Ht 66.0 in | Wt 147.5 lb

## 2016-07-20 DIAGNOSIS — Z5112 Encounter for antineoplastic immunotherapy: Secondary | ICD-10-CM | POA: Diagnosis not present

## 2016-07-20 DIAGNOSIS — E119 Type 2 diabetes mellitus without complications: Secondary | ICD-10-CM

## 2016-07-20 DIAGNOSIS — G4700 Insomnia, unspecified: Secondary | ICD-10-CM | POA: Diagnosis not present

## 2016-07-20 DIAGNOSIS — C50411 Malignant neoplasm of upper-outer quadrant of right female breast: Secondary | ICD-10-CM

## 2016-07-20 DIAGNOSIS — Z853 Personal history of malignant neoplasm of breast: Secondary | ICD-10-CM

## 2016-07-20 LAB — CBC WITH DIFFERENTIAL/PLATELET
BASO%: 0.4 % (ref 0.0–2.0)
Basophils Absolute: 0 10*3/uL (ref 0.0–0.1)
EOS ABS: 0.1 10*3/uL (ref 0.0–0.5)
EOS%: 1.5 % (ref 0.0–7.0)
HCT: 33 % — ABNORMAL LOW (ref 34.8–46.6)
HGB: 11.4 g/dL — ABNORMAL LOW (ref 11.6–15.9)
LYMPH%: 18.4 % (ref 14.0–49.7)
MCH: 31.8 pg (ref 25.1–34.0)
MCHC: 34.5 g/dL (ref 31.5–36.0)
MCV: 91.9 fL (ref 79.5–101.0)
MONO#: 0.2 10*3/uL (ref 0.1–0.9)
MONO%: 5.3 % (ref 0.0–14.0)
NEUT#: 3.4 10*3/uL (ref 1.5–6.5)
NEUT%: 74.4 % (ref 38.4–76.8)
PLATELETS: 227 10*3/uL (ref 145–400)
RBC: 3.59 10*6/uL — AB (ref 3.70–5.45)
RDW: 12.6 % (ref 11.2–14.5)
WBC: 4.6 10*3/uL (ref 3.9–10.3)
lymph#: 0.8 10*3/uL — ABNORMAL LOW (ref 0.9–3.3)

## 2016-07-20 LAB — COMPREHENSIVE METABOLIC PANEL
ALT: 18 U/L (ref 0–55)
ANION GAP: 9 meq/L (ref 3–11)
AST: 15 U/L (ref 5–34)
Albumin: 3.8 g/dL (ref 3.5–5.0)
Alkaline Phosphatase: 65 U/L (ref 40–150)
BILIRUBIN TOTAL: 0.84 mg/dL (ref 0.20–1.20)
BUN: 9.5 mg/dL (ref 7.0–26.0)
CHLORIDE: 106 meq/L (ref 98–109)
CO2: 26 meq/L (ref 22–29)
Calcium: 9.5 mg/dL (ref 8.4–10.4)
Creatinine: 0.7 mg/dL (ref 0.6–1.1)
Glucose: 107 mg/dl (ref 70–140)
POTASSIUM: 3.3 meq/L — AB (ref 3.5–5.1)
Sodium: 141 mEq/L (ref 136–145)
Total Protein: 7.6 g/dL (ref 6.4–8.3)

## 2016-07-20 MED ORDER — DIPHENHYDRAMINE HCL 25 MG PO CAPS
ORAL_CAPSULE | ORAL | Status: AC
  Filled 2016-07-20: qty 2

## 2016-07-20 MED ORDER — SODIUM CHLORIDE 0.9 % IV SOLN
Freq: Once | INTRAVENOUS | Status: AC
  Administered 2016-07-20: 11:00:00 via INTRAVENOUS

## 2016-07-20 MED ORDER — ACETAMINOPHEN 325 MG PO TABS
ORAL_TABLET | ORAL | Status: AC
  Filled 2016-07-20: qty 2

## 2016-07-20 MED ORDER — TRASTUZUMAB CHEMO 150 MG IV SOLR
6.0000 mg/kg | Freq: Once | INTRAVENOUS | Status: AC
  Administered 2016-07-20: 420 mg via INTRAVENOUS
  Filled 2016-07-20: qty 20

## 2016-07-20 MED ORDER — ACETAMINOPHEN 325 MG PO TABS
650.0000 mg | ORAL_TABLET | Freq: Once | ORAL | Status: AC
  Administered 2016-07-20: 650 mg via ORAL

## 2016-07-20 MED ORDER — HEPARIN SOD (PORK) LOCK FLUSH 100 UNIT/ML IV SOLN
500.0000 [IU] | Freq: Once | INTRAVENOUS | Status: AC | PRN
  Administered 2016-07-20: 500 [IU]
  Filled 2016-07-20: qty 5

## 2016-07-20 MED ORDER — DIPHENHYDRAMINE HCL 25 MG PO CAPS
50.0000 mg | ORAL_CAPSULE | Freq: Once | ORAL | Status: AC
  Administered 2016-07-20: 50 mg via ORAL

## 2016-07-20 MED ORDER — SODIUM CHLORIDE 0.9 % IJ SOLN
10.0000 mL | INTRAMUSCULAR | Status: DC | PRN
  Administered 2016-07-20: 10 mL
  Filled 2016-07-20: qty 10

## 2016-07-20 MED ORDER — EXEMESTANE 25 MG PO TABS: 25.0000 mg | ORAL_TABLET | Freq: Every day | ORAL | 1 refills | Status: DC

## 2016-07-20 MED ORDER — SODIUM CHLORIDE 0.9% FLUSH
10.0000 mL | INTRAVENOUS | Status: DC | PRN
  Administered 2016-07-20: 10 mL
  Filled 2016-07-20: qty 10

## 2016-07-20 NOTE — Progress Notes (Signed)
[place]  [phone] [phone]  Clinic follow Up Note   Patient Care Team: Kristie Cowman, MD as PCP - General (Family Medicine) Stark Klein, MD as Consulting Physician (General Surgery) Truitt Merle, MD as Consulting Physician (Hematology) Arloa Koh, MD as Consulting Physician (Radiation Oncology) Mauro Kaufmann, RN as Registered Nurse Rockwell Germany, RN as Registered Nurse Sylvan Cheese, NP as Nurse Practitioner (Nurse Practitioner) 07/20/2016  CHIEF COMPLAINTS:  Follow up right breast cancer  Oncology History   Breast cancer [facility])   Staging form: Breast, AJCC 7th Edition     Pathologic stage from 11/25/2015: Stage IIA (T1c, N1a, cM0) - Unsigned     Pathologic stage from 11/25/2015: Stage IIA (T1c, N1a, cM0) - Unsigned Breast cancer of upper-outer quadrant of right female breast [facility])   Staging form: Breast, AJCC 7th Edition     Clinical stage from 09/15/2015: Stage IA (T1b, N0, M0) - Signed by Truitt Merle, MD on 12/16/2015     Pathologic stage from 11/25/2015: Stage IIA (T1c, N1a, cM0) - Signed by Truitt Merle, MD on 12/16/2015         Breast cancer of upper-outer quadrant of right female breast ([place])   09/07/2015 Mammogram    Diagnostic mammogram and the ultrasound of the right breast showed a 0.8 cm lobulated mass in the right breast 11 to 12:00 position 9 cm from the nipple. No other lesions or adenopathy.     09/08/2015 Initial Diagnosis    Breast cancer of upper-outer quadrant of right female breast ([place])     09/08/2015 Initial Biopsy    Right breast mass at the upper outer quadrant core needle biopsy showed invasive ductal carcinoma, grade 2-3, ductal carcinoma in situ.     09/08/2015 Receptors her2    ER 100% positive, PR 100% positive, Ki-67 60%, HER-2 positive with copy # 4.25 and ratio 2.58     11/25/2015 Surgery    Right breast mastectomy and sentinel lymph node biopsy     11/25/2015 Pathology Results    Right breast  invasive ductal carcinoma, grade 2, 2.0 cm, DCIS, intermediate grade, (+) LVI, margins were negative, 1 out of 3 lymph nodes were negative. Left breast ostectomy was negative for malignancy.     01/06/2016 - 04/20/2016 Chemotherapy    adjuvant chemotherapy TCH P, every 3 weeks, for a total of 6 cycles. Herceptin and Perjeta were held for last two cycles due to drop of her EF on echo      05/25/2016 - 07/11/2016 Radiation Therapy    Adjuvant breast radiation     06/08/2016 -  Chemotherapy    She started maintenance Herceptin every 3 weeks, after clearance from cardiology.      HISTORY OF PRESENTING ILLNESS:  Sandra James 50 y.o. female with past medical history of stage I left breast cancer, is here because of newly diagnosed right breast cancer. She presents to our multidisciplinary breast clinic by herself.  The right breast cancer was discovered by screening mammogram. She did not have any palpable mass, or any constitutional symptoms. She was diagnosed with stage I left breast cancer at age of 66, she had lumpectomy, radiation, and adjuvant chemotherapy and 5 years of tamoxifen. She was treated by Dr. Louann Sjogren in [place]. She moved to [facility] about year ago due to job change. She has been very compliant with annual screening mammogram.  She had hysterectomy and bilateral oophorectomy and sphincterectomy 5 years ago for heavy bleeding.Marland Kitchen  She has been having hot flashes since then, moderate, but manageable. She is single, lives alone, works for 2 to [employer] [place] has to go up children who live in [place].  CURRENT THERAPY: maintenance Herceptin every 3 weeks   [place] returns for follow-up. She has completed breast radiation on 07/11/2016. She tolerated the radiation well overall, had a moderate radiation dermatitis, but no skin ulcers or breakdown. She also complains of moderate fatigue, some are contributed to radiation. She has intermittent dizziness in the low blood  pressure, so she has not been taking her cardiac medication in the past few days. She also complains of insomnia, she was on Ambien 10 mg daily, and has been trying to wean off, but she could not sleep well when she is off. No other new complaints.  MED ICAL HISTORY:  Past Medical History:  Diagnosis Date  . Allergy   . Anxiety   . Breast cancer ([place]) 09/07/15   right breast  . Breast cancer of upper-outer quadrant of right female breast ([place]) 09/09/2015  . Cancer [facility])    left brast lumpectomy   . Complication of anesthesia    slow to wake up- BP was low, fainted next day-  . Depression   . Diabetes mellitus without complication ([place])   . Fibroid   . GERD (gastroesophageal reflux disease)   . Infection    UTI  . MVP (mitral valve prolapse)   . Neuromuscular disorder ([place])    sciatic nerve paion left side/leg  . UTI (lower urinary tract infection)     SURGICAL HISTORY: Past Surgical History:  Procedure Laterality Date  . ABDOMINAL HYSTERECTOMY    . BREAST RECONSTRUCTION WITH PLACEMENT OF TISSUE EXPANDER AND FLEX HD (ACELLULAR HYDRATED DERMIS) Bilateral 11/25/2015   Procedure: BILATERAL IMMEDIATE BREAST RECONSTRUCTION WITH PLACEMENT OF TISSUE EXPANDERS AND FLEX HD (ACELLULAR HYDRATED DERMIS);  Surgeon: Wallace Going, DO;  Location: [facility];  Service: Plastics;  Laterality: Bilateral;  . BREAST SURGERY  2004   left lumpectomy  . MASTECTOMY W/ SENTINEL NODE BIOPSY Bilateral 11/25/2015   Procedure: BILATERAL SKIN SPARING MASTECTOMIES WITH RIGHT SENTINEL LYMPH NODE BIOPSY;  Surgeon: Stark Klein, MD;  Location: [place];  Service: General;  Laterality: Bilateral;  . OOPHORECTOMY Bilateral   . PORTACATH PLACEMENT Left 12/20/2015   Procedure: INSERTION PORT-A-CATH, left chest;  Surgeon: Stark Klein, MD;  Location: [place];  Service: General;  Laterality: Left;    SOCIAL HISTORY: Social History   Social History  . Marital Status: Single      Spouse Name: N/A  . Number of Children: 2 children, 30 daughter and 29 yo son    . Years of Education: N/A   Occupational History  . She is a Barista rep   Social History Main Topics  . Smoking status: Never Smoker   . Smokeless tobacco: Never Used  . Alcohol Use: Yes     Comment: 2-3/wk  . Drug Use: No  . Sexual Activity: Yes    Birth Control/ Protection: Surgical   Other Topics Concern  . Not on file   Social History Narrative    FAMILY HISTORY: Family History  Problem Relation Age of Onset  . Hypertension Mother   . Stroke Mother   . Alzheimer's disease Mother   . Heart disease Father   . Stroke Father   . Heart attack Father   . Breast cancer Sister 27    double mastectomy  . Other Sister     "  stomach tumor that wrapped around reproductive organs"; dx. 33s; required partial hysterectomy  . Alzheimer's disease Maternal Grandmother   . Alzheimer's disease Paternal Grandmother   . Other Other     had to have lymph nodes removed and radiation; dx. 18s  . Cancer Maternal Aunt     unspecified type; dx. <50  . Colon cancer Maternal Uncle     dx. 57s  . Breast cancer Cousin     dx. 14s    ALLERGIES:  is allergic to lisinopril; oxycodone; and percocet [oxycodone-acetaminophen].  MEDICATIONS:  Current Outpatient Prescriptions  Medication Sig Dispense Refill  . bisoprolol (ZEBETA) 5 MG tablet Take 0.5 tablets (2.5 mg total) by mouth at bedtime. 15 tablet 3  . fluticasone (FLONASE) 50 MCG/ACT nasal spray Place 1 spray into both nostrils daily as needed for allergies.   0  . ibuprofen (ADVIL,MOTRIN) 800 MG tablet Take 800 mg by mouth daily as needed for headache or moderate pain.    . Iron-Vitamins (GERITOL) LIQD Take 30 mLs by mouth daily. Energy support    . losartan (COZAAR) 25 MG tablet Take 0.5 tablets (12.5 mg total) by mouth daily. 90 tablet 3  . mirtazapine (REMERON) 30 MG tablet Take 30 mg by mouth as needed. Reported on 03/30/2016  3  . Multiple Vitamin  (MULTIVITAMIN) tablet Take 1 tablet by mouth daily.    . traMADol (ULTRAM) 50 MG tablet Take 1-2 tablets (50-100 mg total) by mouth every 6 (six) hours as needed. 60 tablet 0  . zolpidem (AMBIEN) 10 MG tablet Take 1 tablet (10 mg total) by mouth at bedtime as needed for sleep. 30 tablet 2  . exemestane (AROMASIN) 25 MG tablet Take 1 tablet (25 mg total) by mouth daily after breakfast. 30 tablet 1   No current facility-administered medications for this visit.    Facility-Administered Medications Ordered in Other Visits  Medication Dose Route Frequency Provider Last Rate Last Dose  . heparin lock flush 100 unit/mL  500 Units Intracatheter Once PRN Truitt Merle, MD      . sodium chloride flush (NS) 0.9 % injection 10 mL  10 mL Intracatheter PRN Truitt Merle, MD      . trastuzumab (HERCEPTIN) 420 mg in sodium chloride 0.9 % 250 mL chemo infusion  6 mg/kg (Treatment Plan Recorded) Intravenous Once Truitt Merle, MD 540 mL/hr at 07/20/16 1129 420 mg at 07/20/16 1129    REVIEW OF SYSTEMS:   Constitutional: Denies fevers, chills or abnormal night sweats Eyes: Denies blurriness of vision, double vision or watery eyes Ears, nose, mouth, throat, and face: Denies mucositis or sore throat Respiratory: Denies cough, dyspnea or wheezes Cardiovascular: Denies palpitation, chest discomfort or lower extremity swelling Gastrointestinal:  Denies nausea, heartburn or change in bowel habits Skin: Denies abnormal skin rashes Lymphatics: Denies new lymphadenopathy or easy bruising Neurological:Denies numbness, tingling or new weaknesses Behavioral/Psych: Mood is stable, no new changes  All other systems were reviewed with the patient and are negative.  PHYSICAL EXAMINATION: ECOG PERFORMANCE STATUS: 1  Vitals:   07/20/16 1016  BP: 114/76  Pulse: 80  Resp: 18  Temp: 98.1 F (36.7 C)   Filed Weights   07/20/16 1016  Weight: 147 lb 8 oz (66.9 kg)    GENERAL:alert, no distress and comfortable SKIN: skin color,  texture, turgor are normal, no rashes or significant lesions EYES: normal, conjunctiva are pink and non-injected, sclera clear, eye lids appears normal OROPHARYNX:no exudate, no erythema and lips, buccal mucosa, and tongue normal  NECK: supple, thyroid normal size, non-tender, without nodularity LYMPH:  no palpable lymphadenopathy in the cervical, axillary or inguinal LUNGS: clear to auscultation and percussion with normal breathing effort HEART: regular rate & rhythm and no murmurs and no lower extremity edema ABDOMEN:abdomen soft, non-tender and normal bowel sounds Musculoskeletal:no cyanosis of digits and no clubbing  PSYCH: alert & oriented x 3 with fluent speech NEURO: no focal motor/sensory deficits Breasts: s/p bilateral mastectomy and tissue spender placement. Surgical incision sites are clean and well healed. (+) Diffuse skin pigmentation in the right breast, no skin ulcers.     LABORATORY DATA:  I have reviewed the data as listed CBC Latest Ref Rng & Units 07/20/2016 06/29/2016 06/08/2016  WBC 3.9 - 10.3 10e3/uL 4.6 4.3 5.0  Hemoglobin 11.6 - 15.9 g/dL 11.4(L) 11.6 12.0  Hematocrit 34.8 - 46.6 % 33.0(L) 34.8 36.0  Platelets 145 - 400 10e3/uL 227 207 245    CMP Latest Ref Rng & Units 07/20/2016 06/29/2016 06/08/2016  Glucose 70 - 140 mg/dl 107 97 120  BUN 7.0 - 26.0 mg/dL 9.5 12.6 11.1  Creatinine 0.6 - 1.1 mg/dL 0.7 0.7 0.7  Sodium 136 - 145 mEq/L 141 140 140  Potassium 3.5 - 5.1 mEq/L 3.3(L) 3.6 3.7  Chloride 101 - 111 mmol/L - - -  CO2 22 - 29 mEq/L 26 26 27   Calcium 8.4 - 10.4 mg/dL 9.5 9.6 9.4  Total Protein 6.4 - 8.3 g/dL 7.6 7.6 7.3  Total Bilirubin 0.20 - 1.20 mg/dL 0.84 0.99 0.49  Alkaline Phos 40 - 150 U/L 65 63 77  AST 5 - 34 U/L 15 15 17   ALT 0 - 55 U/L 18 13 16      Pathology report Diagnosis 11/25/2015 1. Breast, simple mastectomy, right - INVASIVE DUCTAL CARCINOMA, GRADE 2/3, SPANNING 2.0 CM. - DUCTAL CARCINOMA IN SITU, INTERMEDIATE GRADE. -  LYMPHOVASCULAR INVASION IS IDENTIFIED. - LOBULAR NEOPLASIA (ATYPICAL LOBULAR HYPERPLASIA). - THE SURGICAL RESECTION MARGINS ARE NEGATIVE FOR CARCINOMA. - SEE ONCOLOGY TABLE BELOW. 2. Lymph node, sentinel, biopsy, right axillary #1 - METASTATIC CARCINOMA IN 1 OF 1 LYMPH NODE (1/1). 3. Lymph node, sentinel, biopsy, right axillary #2 - THERE IS NO EVIDENCE OF CARCINOMA IN 1 OF 1 LYMPH NODE (0/1). 4. Lymph node, sentinel, biopsy, right axillary #3 - THERE IS NO EVIDENCE OF CARCINOMA IN 1 OF 1 LYMPH NODE (0/1). 5. Breast, simple mastectomy, left - BENIGN BREAST PARENCHYMA WITH DENSE STROMAL FIBROSIS. - FIBROADENOMA. - THERE IS NO EVIDENCE OF MALIGNANCY. 6. Breast, excision, left, additional lateral margin - BENIGN FIBROADIPOSE TISSUE. - THERE IS NO EVIDENCE OF MALIGNANCY. - SEE COMMENT. Microscopic Comment 1. BREAST, INVASIVE TUMOR, WITH LYMPH NODES PRESENT Specimen, including laterality and lymph node sampling (sentinel, non-sentinel): Right breast and right axillary lymph nodes Procedure: Bilateral mastectomy and multiple right axillary lymph node resections Histologic type: Ductal Grade: 2 Tubule formation: 2 1 of 4 FINAL for Sattler, Albirda ([id number]) Microscopic Comment(continued) Nuclear pleomorphism: 2 Mitotic: 2 Tumor size (gross measurement): 2.0 cm Margins: Negative for carcinoma Invasive, distance to closest margin: 1.8 cm to the posterior margin In-situ, distance to closest margin: 1.8 cm to the posterior margin Lymphovascular invasion: Present Ductal carcinoma in situ: Present Grade: Intermediate grade Extensive intraductal component: Not identified Lobular neoplasia: Present, atypical lobular hyperplasia Tumor focality: Unifocal Treatment effect: Not identified Extent of tumor: Confined to breast parenchyma Lymph nodes: Examined: 3 Sentinel 0 Non-sentinel 3 Total Lymph nodes with metastasis: 1 (macrometastasis) - no extracapsular extension is  identified. Breast prognostic  profile: [phone] Estrogen receptor: 100%, strong staining intensity Progesterone receptor: 100%, strong staining intensity Her 2 neu: Amplification was detected. The ratio was 2.58 Ki-67: 60% TNM: pT1c, pN1a Non-neoplastic breast: No significant findings. 6. The surgical resection margin(s) of the specimen were inked and microscopically evaluated. Enid Cutter MD Pathologist, Electronic Signature (Case signed 11/29/2015)   RADIOGRAPHIC STUDIES: I have personally reviewed the radiological images as listed and agreed with the findings in the report.  Bone scan 12/28/2014 IMPRESSION: Foci of increased tracer localization at the sternum, unable to exclude metastases.  Uptake at inferior cervical spine could potentially be related to degenerative disc disease changes present at C5-C6 on CT.  Questionable abnormal increased tracer localization at the posterior LEFT ninth and tenth ribs.  CT chest, abdomen and pelvis 12/28/2014 IMPRESSION: 1. 2 cm enhancing lesion in segment 5 of the liver is indeterminate. It could reflect FNH, vascular shunt and less likely metastasis. Recommend MRI abdomen without and with contrast using Eovist for further evaluation. No other hepatic lesions. 2. No findings for metastatic disease involving the chest. There is a focal area of airspace disease in the superior segment of the right lower lobe which could be a developing or resolving infiltrate. Recommend correlation with clinical symptoms.  ECHO 05/09/2016 Study Conclusions  - Left ventricle: The cavity size was normal. Wall thickness was  normal. Systolic function was normal. The estimated ejection  fraction was in the range of 50% to 55%. Wall motion was normal;  there were no regional wall motion abnormalities. Doppler  parameters are consistent with abnormal left ventricular  relaxation (grade 1 diastolic dysfunction). - Aortic valve: There was no  stenosis. - Mitral valve: There was no significant regurgitation. - Right ventricle: The cavity size was normal. Wall thickness was  normal. Systolic function was normal. - Atrial septum: No defect or patent foramen ovale was identified. - Tricuspid valve: Peak RV-RA gradient (S): 19 mm Hg. - Pulmonary arteries: PA peak pressure: 22 mm Hg (S). - Inferior vena cava: The vessel was normal in size. The  respirophasic diameter changes were in the normal range (= 50%),  consistent with normal central venous pressure. - Impressions: Low normal EF 50-55% abnormal GLS -14.4  Impressions:  - Low normal EF 50-55% abnormal GLS -14.4   ASSESSMENT & PLAN:  50 year old African-American female, surgical postmenopausal, history of stage I left breast cancer, with newly diagnosed right breast cancer.  1. Right breast invasive ductal carcinoma, pT1cN1aM0, stage IIB, grade 2, ER100%+/PR100%+/HER2+ -I previously reviewed her surgical pathology results with patient in great details. She has 1 out of 3 sentinel lymph nodes positive, her pathological stage is more advanced than her initial clinical stage.  -We reviewed the natural history of triple positive breast cancer. HER-2 positive tumors are more progressive, with higher risk of recurrence -I reviewed her restaging CT scan and bone scan images with her in person. I discussed the bone scan findings with radiologist Dr. Thornton Papas. The mild hypermetabolic uptake in the sternum and left ninth and 10th rib have no significant corresponding change on the CT scan, except for small lytic spot in the sternum. They're not amenable for biopsy, PET scan may not have much additional value, the suspicion for metastases from breast cancer is low. I recommend follow-up. The 2 cm lesion in the liver are likely benign, unfortunately she is not able to do liver MRI, due to her breast implants.  -She is still concerned about the above scan findings. I recommend to have a  repeated CT or PET scan a few months after she completes adjuvant chemotherapy for follow-up. -Given her high risk of cancer recurrence, I recommend adjuvant chemotherapy with docetaxel, carboplatin, Herceptin and Perjeta (TCHP), every 3 weeks for 6 cycles,  followed by maintenance Herceptin to complete a 1 year therapy. She previously received Adriamycin, will not be able to tolerate more adrimycin. Herceptin and pejeta was held for the last 2 cycle chemotherapy, due to her decreased EF. -Her repeated echo has showed normal EF, she has restarted Herceptin maintenance therapy. -She  has now completed adjuvant breast and axilla radiation and tolerated well. --Given the strong ER and PR positivity, I do recommend adjuvant aromatase inhibitor to reduce her risk of cancer recurrence,  The potential benefit and side effects, which includes but not limited to, hot flash, skin and vaginal dryness, metabolic changes ( increased blood glucose, cholesterol, weight, etc.), slightly in increased risk of cardiovascular disease, cataracts, muscular and joint discomfort, osteopenia and osteoporosis, etc, were discussed with her in great details. She previously did not tolerate letrozole, I recommend her to try exemestane. We discussed the other option of tamoxifen which she took before. Given her postmenopausal status, I would prefer aromatase inhibitor if she can tolerate. She agrees to try. -I cautery in exemestane to her pharmacy, she will start at half tablet daily for 1 week, then go to for dose afterwards. -continue breast cancer surveillance    2. Genetics -Due to her young age and recurrent breast cancer, she was referred to see a genetic counselor in our cancer center. -Her genetic test was negative  3. DM -she will continue follow-up with her primary care physician -She is on metformin   4. Bone health -She is postmenopausal by surgery -I encouraged her to take calcium and vitamin D -I'll obtain a  baseline bone density scan in the next few months   6. Insomnia and low appetite  -continue mirtazapine -I recommend her to wean off Ambien, changed to 5 mg at bedtime for a few weeks, and then every other night and wean off.   Plan for today  -Lab reviewed, continue Herceptin. -She is scheduled to see Dr. Rhae Hammock and have a repeat echo on August 23 -She'll start exemestane in 2 weeks -I'll see her back in 3 weeks before next dose  Herceptin. I'll order a bone density scan on next visit.   All questions were answered. The patient knows to call the clinic with any problems, questions or concerns.  I spent 25 minutes counseling the patient face to face. The total time spent in the appointment was 30 minutes and more than 50% was on counseling.     Truitt Merle, MD 07/20/2016

## 2016-07-20 NOTE — Progress Notes (Unsigned)
Patient came in to follow up on Financial applications to organizations. Advised patient that the foundations contact the patient's directly and provided her with contact information for the [employer] and the [employer]. [employer] was only assisting patient in the [place] area. Gave her a [facility] FAA for hardship if her balance exceeds $5000. Not showing a balance of that amount on the PB side. Contacted Meredith with Radiation to see what her balance is for Radiation but it is still pending in insurance. Patient has my card for any additional financial questions or concerns.

## 2016-07-20 NOTE — Patient Instructions (Signed)
[facility] Discharge Instructions for Patients Receiving Chemotherapy  Today you received the following chemotherapy agents: Herceptin   To help prevent nausea and vomiting after your treatment, we encourage you to take your nausea medication as directed.    If you develop nausea and vomiting that is not controlled by your nausea medication, call the clinic.   BELOW ARE SYMPTOMS THAT SHOULD BE REPORTED IMMEDIATELY:  *FEVER GREATER THAN 100.5 F  *CHILLS WITH OR WITHOUT FEVER  NAUSEA AND VOMITING THAT IS NOT CONTROLLED WITH YOUR NAUSEA MEDICATION  *UNUSUAL SHORTNESS OF BREATH  *UNUSUAL BRUISING OR BLEEDING  TENDERNESS IN MOUTH AND THROAT WITH OR WITHOUT PRESENCE OF ULCERS  *URINARY PROBLEMS  *BOWEL PROBLEMS  UNUSUAL RASH Items with * indicate a potential emergency and should be followed up as soon as possible.  Feel free to call the clinic you have any questions or concerns. The clinic phone number is [phone].  Please show the CHEMO ALERT CARD at check-in to the Emergency Department and triage nurse.   

## 2016-07-20 NOTE — Progress Notes (Signed)
Pt given list of potassium rich foods

## 2016-07-21 NOTE — Progress Notes (Signed)
°  Radiation Oncology         [phone]) [phone] ________________________________  Name: Sandra James MRN: [id number]  Date: 07/11/2016  DOB: Jul 17, 1966  End of Treatment Note  Diagnosis:      ICD-9-CM ICD-10-CM   1. Breast cancer of upper-outer quadrant of right female breast ([place]) 174.4 C50.411        Indication for treatment:  Curative       Radiation treatment dates:   05/25/2016 to 07/11/2016  Site/dose:    1. The Right chest wall (4-field) was treated to 50.4 Gy in 28 fractions at 1.8 Gy per fraction. 2. The Right chest wall was boosted to 10 Gy in 5 fractions at 2 Gy per fraction. 3. The Right supraclavicular region was treated to 50.4 Gy in 28 fractions at 1.8 Gy per fraction.  Beams/energy:    1. 3D // 6X 2. En face // 6 MeV 3. 3D // 10X, 6X  Narrative: The patient tolerated radiation treatment relatively well.   She experienced mild fatigue with treatment. Her skin looked quite good without any desquamation.  Plan: The patient has completed radiation treatment. The patient will return to radiation oncology clinic for routine followup in one month. I advised them to call or return sooner if they have any questions or concerns related to their recovery or treatment.  ------------------------------------------------  Jodelle Gross, MD, PhD  This document serves as a record of services personally performed by Kyung Rudd, MD. It was created on his behalf by Arlyce Harman, a trained medical scribe. The creation of this record is based on the scribe's personal observations and the provider's statements to them. This document has been checked and approved by the attending provider.

## 2016-07-25 ENCOUNTER — Telehealth: Payer: Self-pay | Admitting: *Deleted

## 2016-07-25 NOTE — Telephone Encounter (Signed)
Pt called and left message about feeling light headedness since yesterday.  Spoke with pt and was informed that pt had experienced light headedness before.  Symptom comes and goes.   Stated now she has been experienced light headedness since yesterday intermittently.  Denied blurred vision, denied pain, denied nausea/vomiting.  Stated her blood pressure readings were fine.  Stated drinking lots of fluids as tolerated. Dr. Burr Medico notified.   Spoke with pt and instructed pt to continue to monitor.  If symptom occurs again and lasted longer in duration, pt needs to call office back for further instructions.   Pt voiced understanding. Pt's    Phone    [phone].

## 2016-07-28 ENCOUNTER — Telehealth: Payer: Self-pay | Admitting: Hematology

## 2016-07-28 NOTE — Telephone Encounter (Signed)
APPTS CONF WITH PATIENT.

## 2016-07-31 ENCOUNTER — Telehealth (HOSPITAL_COMMUNITY): Payer: Self-pay | Admitting: *Deleted

## 2016-07-31 ENCOUNTER — Telehealth: Payer: Self-pay | Admitting: *Deleted

## 2016-07-31 NOTE — Telephone Encounter (Signed)
Spoke with pt and was informed pt is still waiting to hear back from her cardiologist.  Informed pt that she could take Tylenol for headache to see if symptom would resolve.   Pt understood that Dr. Burr Medico will discuss further with pt at her next office appt on on 08/10/16.

## 2016-07-31 NOTE — Telephone Encounter (Signed)
Pt called stating that she now has constant headache, and requested call back from nurse.   Spoke with pt and was informed that she experienced  Headache on Friday ,  Constant  Headache  All  Day  Saturday  8/19.  Sunday, lightheadedness and  headache comes and goes.   Denied blurred vision, denied nausea/vomiting.   Pt has not  Taken  Exemestane , but pt does have script for med.   Pt did not take Tylenol for headache due to concerns about her recurrence.   Instructed pt to contact her cardiologist  Dr. Haroldine Laws also to see if headache can be caused by any cardiac meds.   Dr. Burr Medico notified. Pt's   Phone    [phone].

## 2016-07-31 NOTE — Telephone Encounter (Signed)
Pt called earlier this AM to report she has been having a headache for past several days off/on and wanted to know if it could be r/t her heart meds.  She denied blurry vision and nausea/vomiting.  Discussed w/Dr Bensimhon, pt has been on Bisoprolol and losartan for while at current doses, he does not feel headache is related to meds at all and advised she f/u with onc or pcp.  Attempted to call pt and and left her a detailed mess w/DrBesnimhon recommendations.

## 2016-08-02 ENCOUNTER — Ambulatory Visit (HOSPITAL_COMMUNITY)
Admission: RE | Admit: 2016-08-02 | Discharge: 2016-08-02 | Disposition: A | Discharge status: Home / Self Care | Payer: BLUE CROSS/BLUE SHIELD | Source: Ambulatory Visit | Attending: Internal Medicine | Admitting: Internal Medicine

## 2016-08-02 ENCOUNTER — Encounter (HOSPITAL_COMMUNITY): Payer: Self-pay | Admitting: Internal Medicine

## 2016-08-02 ENCOUNTER — Ambulatory Visit (HOSPITAL_BASED_OUTPATIENT_CLINIC_OR_DEPARTMENT_OTHER)
Admission: RE | Admit: 2016-08-02 | Discharge: 2016-08-02 | Disposition: A | Discharge status: Home / Self Care | Payer: BLUE CROSS/BLUE SHIELD | Source: Ambulatory Visit | Attending: Internal Medicine | Admitting: Internal Medicine

## 2016-08-02 VITALS — BP 102/66 | HR 84 | Wt 144.5 lb

## 2016-08-02 DIAGNOSIS — Z9221 Personal history of antineoplastic chemotherapy: Secondary | ICD-10-CM | POA: Insufficient documentation

## 2016-08-02 DIAGNOSIS — Z79811 Long term (current) use of aromatase inhibitors: Secondary | ICD-10-CM | POA: Diagnosis not present

## 2016-08-02 DIAGNOSIS — Z9013 Acquired absence of bilateral breasts and nipples: Secondary | ICD-10-CM | POA: Insufficient documentation

## 2016-08-02 DIAGNOSIS — Z8249 Family history of ischemic heart disease and other diseases of the circulatory system: Secondary | ICD-10-CM | POA: Diagnosis not present

## 2016-08-02 DIAGNOSIS — Z888 Allergy status to other drugs, medicaments and biological substances status: Secondary | ICD-10-CM | POA: Insufficient documentation

## 2016-08-02 DIAGNOSIS — Z170 Estrogen receptor positive status [ER+]: Secondary | ICD-10-CM | POA: Insufficient documentation

## 2016-08-02 DIAGNOSIS — E119 Type 2 diabetes mellitus without complications: Secondary | ICD-10-CM | POA: Insufficient documentation

## 2016-08-02 DIAGNOSIS — T451X5A Adverse effect of antineoplastic and immunosuppressive drugs, initial encounter: Secondary | ICD-10-CM | POA: Diagnosis not present

## 2016-08-02 DIAGNOSIS — R51 Headache: Secondary | ICD-10-CM

## 2016-08-02 DIAGNOSIS — Z79899 Other long term (current) drug therapy: Secondary | ICD-10-CM | POA: Diagnosis not present

## 2016-08-02 DIAGNOSIS — C50411 Malignant neoplasm of upper-outer quadrant of right female breast: Secondary | ICD-10-CM | POA: Diagnosis not present

## 2016-08-02 DIAGNOSIS — I427 Cardiomyopathy due to drug and external agent: Secondary | ICD-10-CM

## 2016-08-02 DIAGNOSIS — Z885 Allergy status to narcotic agent status: Secondary | ICD-10-CM | POA: Diagnosis not present

## 2016-08-02 DIAGNOSIS — I341 Nonrheumatic mitral (valve) prolapse: Secondary | ICD-10-CM | POA: Insufficient documentation

## 2016-08-02 DIAGNOSIS — K219 Gastro-esophageal reflux disease without esophagitis: Secondary | ICD-10-CM | POA: Diagnosis not present

## 2016-08-02 DIAGNOSIS — R519 Headache, unspecified: Secondary | ICD-10-CM

## 2016-08-02 DIAGNOSIS — Z823 Family history of stroke: Secondary | ICD-10-CM | POA: Insufficient documentation

## 2016-08-02 NOTE — Progress Notes (Signed)
Patient ID: Sandra James, female   DOB: 03-Jun-1966, 50 y.o.   MRN: [id number]     [place] NOTE  Referring Physician: Burr Medico Primary Care: Kristie Cowman Primary Cardiologist: Dr. Haroldine Laws   HPI:  Sandra James is a 50 y.o. female with h/o DM2 and GERD who was diagnosed with right breast invasive ductal carcinoma pT1cN1aM0, stage IIB, grade 2, ER+/PR+, HER2+ with + 1 out of 3 + sentinel lymph nodes. Discovered on screening mammogram 09/07/15. She is now s/p bilateral mastectomy 11/30/15. She is referred to toe cardio-oncology clinic by Dr. Burr Medico  Of note, she had a left breast lumpectomy in 2004 and was treated at that time with Adriamycin.    Oncology History   Breast cancer of upper-outer quadrant of right female breast [facility])  Staging form: Breast, AJCC 7th Edition  Clinical stage from 09/15/2015: Stage IA (T1b, N0, M0) - Unsigned       Breast cancer of upper-outer quadrant of right female breast ([place])   09/07/2015 Mammogram Diagnostic mammogram and the ultrasound of the right breast showed a 0.8 cm lobulated mass in the right breast 11 to 12:00 position 9 cm from the nipple. No other lesions or adenopathy.   09/08/2015 Initial Diagnosis Breast cancer of upper-outer quadrant of right female breast ([place])   09/08/2015 Initial Biopsy Right breast mass at the upper outer quadrant core needle biopsy showed invasive ductal carcinoma, grade 2-3, ductal carcinoma in situ.   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 Left breast ostectomy was negative for malignancy.       She returns today for unscheduled follow up  due to HAs. Previously herceptin stopped very mild cardiotoxicity which recovered. Herceptin restarted in June. Has had at least 2 doses of Herceptin since that time. Over past 2-3 weeks has been having severe HAs and dizziness that can occur at any time. Not related to position or exertion. No swelling or SOB. No vertigo. No other neuro sx. No h/o migraine. Stopped bisoprolol and losartan without benefit   Scheduled for 1 year of herceptin. (started 01/07/16)   ECHO 12/21/15 55-60% Lateral s' 11.0   cm/s     GLS -18.5% no MVP ECHO 03/21/16 50-55% Lateral s' 9.2     cm/s     GLS -16.1% ECHO 05/09/16 55%      Lateral s' 12.06 cm/s     GLS -14.4% ECHO 05/30/16 60%      Lateral s' 12.1  cm/s     GLS -18.0% ECHO 08/02/16 55-60%        GLS -18.0% (echor reviewed personally)   Past Medical History:  Diagnosis Date  . Allergy   . Anxiety   . Breast cancer ([place]) 09/07/15   right breast  . Breast cancer of upper-outer quadrant of right female breast ([place]) 09/09/2015  . Cancer [facility])    left brast lumpectomy   . Complication of anesthesia    slow to wake up- BP was low, fainted next day-  . Depression   . Diabetes mellitus without complication ([place])   . Fibroid   . GERD (gastroesophageal reflux disease)   . Infection    UTI  . MVP (mitral  valve prolapse)   . Neuromuscular disorder ([place])    sciatic nerve paion left side/leg  . UTI (lower urinary tract infection)     Current Outpatient Prescriptions  Medication Sig Dispense Refill  . bisoprolol (ZEBETA) 5 MG tablet Take 0.5 tablets (2.5 mg total) by mouth at bedtime. 15 tablet 3  . exemestane (AROMASIN) 25 MG tablet Take 1 tablet (25 mg total) by mouth daily after breakfast. 30 tablet 1  . fluticasone (FLONASE) 50 MCG/ACT nasal spray Place 1 spray into both nostrils daily as needed for allergies.   0  . ibuprofen (ADVIL,MOTRIN) 800 MG tablet Take 800 mg by mouth daily as needed for headache or moderate pain.    . Iron-Vitamins (GERITOL) LIQD Take  30 mLs by mouth daily. Energy support    . losartan (COZAAR) 25 MG tablet Take 0.5 tablets (12.5 mg total) by mouth daily. 90 tablet 3  . mirtazapine (REMERON) 30 MG tablet Take 30 mg by mouth as needed. Reported on 03/30/2016  3  . Multiple Vitamin (MULTIVITAMIN) tablet Take 1 tablet by mouth daily.    . traMADol (ULTRAM) 50 MG tablet Take 1-2 tablets (50-100 mg total) by mouth every 6 (six) hours as needed. 60 tablet 0  . zolpidem (AMBIEN) 10 MG tablet Take 1 tablet (10 mg total) by mouth at bedtime as needed for sleep. 30 tablet 2   No current facility-administered medications for this encounter.     Allergies  Allergen Reactions  . Lisinopril Cough  . Oxycodone Itching  . Percocet [Oxycodone-Acetaminophen] Itching      Social History   Social History  . Marital status: Single    Spouse name: N/A  . Number of children: N/A  . Years of education: N/A   Occupational History  . Not on file.   Social History Main Topics  . Smoking status: Never Smoker  . Smokeless tobacco: Never Used     Comment: previous secondhand smoke exposure  . Alcohol use 2.4 oz/week    4 Glasses of wine per week     Comment: 1 bottle of wine per wk  . Drug use: No  . Sexual activity: Yes    Birth control/ protection: Surgical   Other Topics Concern  . Not on file   Social History Narrative  . No narrative on file      Family History  Problem Relation Age of Onset  . Hypertension Mother   . Stroke Mother   . Alzheimer's disease Mother   . Heart disease Father   . Stroke Father   . Heart attack Father   . Breast cancer Sister 41    double mastectomy  . Other Sister     "stomach tumor that wrapped around reproductive organs"; dx. 64s; required partial hysterectomy  . Alzheimer's disease Maternal Grandmother   . Alzheimer's disease Paternal Grandmother   . Other Other     had to have lymph nodes removed and radiation; dx. 9s  . Cancer Maternal Aunt     unspecified type; dx. <50  .  Colon cancer Maternal Uncle     dx. 30s  . Breast cancer Cousin     dx. 50s    Vitals:   08/02/16 1128  BP: 102/66  Pulse: 84  SpO2: 100%  Weight: 144 lb 8 oz (65.5 kg)   Wt Readings from Last 3 Encounters:  08/02/16 144 lb 8 oz (65.5 kg)  07/20/16 147 lb 8 oz (66.9 kg)  07/07/16 148 lb 12.8  oz (67.5 kg)     PHYSICAL EXAM: General:  Well appearing. No respiratory difficulty HEENT: normal Neck: supple. no JVD. Carotids 2+ bilat; no bruits. No thyromegaly or nodule noted.  Cor: s/p bilateral mastectomies. PMI nondisplaced. RRR. No M/G/R.  Lungs: CTAB, normal effort Abdomen: soft, NT, ND, no HSM. No bruits or masses. +BS  Extremities: no cyanosis, clubbing, rash, edema Neuro: alert & oriented x 3, cranial nerves grossly intact. moves all 4 extremities w/o difficulty. Affect pleasant.  ASSESSMENT & PLAN:  1. Breast cancer - Right breast invasive ductal carcinoma, pT1cN1aM0, stage IIB, grade 2, ER+/PR+/HER2+  - Now s/p bilateral mastectomy - Has a history of stage 1 L breast CA s/p lumpectomy ( now as above, s/p B mastectomy) and was treated at that time with Adriamycin.  - Now s/p 4/6 cycles of adjuvant chemo + herceptin/perjeta. Recently on hold due to mild cardiotxicity   2. Cardiotoxicity - Very mild chemo-induced cardiomyoapthy. Echo today reviewed personally with complete normalization of LV function.  - Can restart herceptin/perjeta.  Repeat echo 2 months.  - Resume bisoprolol 2.5 and losartan 12.5 - May need several scheduled interruptions to try and complete 1 year of biological therapy.  3. Headaches - Will f/u with Oncology. Consider CT imaging.   Bensimhon, Daniel,MD 11:38 AM

## 2016-08-02 NOTE — Patient Instructions (Signed)
Your physician has requested that you have an echocardiogram. Echocardiography is a painless test that uses sound waves to create images of your heart. It provides your doctor with information about the size and shape of your heart and how well your heart's chambers and valves are working. This procedure takes approximately one hour. There are no restrictions for this procedure.  Your physician recommends that you schedule a follow-up appointment in: 3 months  

## 2016-08-02 NOTE — Progress Notes (Signed)
  Echocardiogram 2D Echocardiogram has been performed.  Sandra James 08/02/2016, 11:23 AM

## 2016-08-10 ENCOUNTER — Encounter: Payer: Self-pay | Admitting: Hematology

## 2016-08-10 ENCOUNTER — Ambulatory Visit (HOSPITAL_BASED_OUTPATIENT_CLINIC_OR_DEPARTMENT_OTHER): Payer: BLUE CROSS/BLUE SHIELD

## 2016-08-10 ENCOUNTER — Other Ambulatory Visit (HOSPITAL_BASED_OUTPATIENT_CLINIC_OR_DEPARTMENT_OTHER): Payer: BLUE CROSS/BLUE SHIELD

## 2016-08-10 ENCOUNTER — Ambulatory Visit: Payer: BLUE CROSS/BLUE SHIELD

## 2016-08-10 ENCOUNTER — Ambulatory Visit (HOSPITAL_BASED_OUTPATIENT_CLINIC_OR_DEPARTMENT_OTHER): Payer: BLUE CROSS/BLUE SHIELD | Admitting: Hematology

## 2016-08-10 VITALS — BP 125/80 | HR 66 | Temp 98.0°F | Resp 18 | Ht 66.0 in | Wt 144.4 lb

## 2016-08-10 DIAGNOSIS — Z170 Estrogen receptor positive status [ER+]: Secondary | ICD-10-CM | POA: Diagnosis not present

## 2016-08-10 DIAGNOSIS — C50411 Malignant neoplasm of upper-outer quadrant of right female breast: Secondary | ICD-10-CM

## 2016-08-10 DIAGNOSIS — Z853 Personal history of malignant neoplasm of breast: Secondary | ICD-10-CM

## 2016-08-10 DIAGNOSIS — E119 Type 2 diabetes mellitus without complications: Secondary | ICD-10-CM | POA: Diagnosis not present

## 2016-08-10 DIAGNOSIS — Z5112 Encounter for antineoplastic immunotherapy: Secondary | ICD-10-CM

## 2016-08-10 DIAGNOSIS — Z780 Asymptomatic menopausal state: Secondary | ICD-10-CM

## 2016-08-10 LAB — CBC WITH DIFFERENTIAL/PLATELET
BASO%: 0.4 % (ref 0.0–2.0)
Basophils Absolute: 0 10*3/uL (ref 0.0–0.1)
EOS%: 2.9 % (ref 0.0–7.0)
Eosinophils Absolute: 0.2 10*3/uL (ref 0.0–0.5)
HCT: 36.2 % (ref 34.8–46.6)
HEMOGLOBIN: 12.1 g/dL (ref 11.6–15.9)
LYMPH%: 20.9 % (ref 14.0–49.7)
MCH: 31.1 pg (ref 25.1–34.0)
MCHC: 33.3 g/dL (ref 31.5–36.0)
MCV: 93.5 fL (ref 79.5–101.0)
MONO#: 0.4 10*3/uL (ref 0.1–0.9)
MONO%: 7.3 % (ref 0.0–14.0)
NEUT%: 68.5 % (ref 38.4–76.8)
NEUTROS ABS: 4 10*3/uL (ref 1.5–6.5)
Platelets: 278 10*3/uL (ref 145–400)
RBC: 3.87 10*6/uL (ref 3.70–5.45)
RDW: 13.1 % (ref 11.2–14.5)
WBC: 5.8 10*3/uL (ref 3.9–10.3)
lymph#: 1.2 10*3/uL (ref 0.9–3.3)

## 2016-08-10 LAB — COMPREHENSIVE METABOLIC PANEL
ALT: 12 U/L (ref 0–55)
AST: 14 U/L (ref 5–34)
Albumin: 3.9 g/dL (ref 3.5–5.0)
Alkaline Phosphatase: 73 U/L (ref 40–150)
Anion Gap: 11 mEq/L (ref 3–11)
BILIRUBIN TOTAL: 0.85 mg/dL (ref 0.20–1.20)
BUN: 9.9 mg/dL (ref 7.0–26.0)
CO2: 25 meq/L (ref 22–29)
Calcium: 9.4 mg/dL (ref 8.4–10.4)
Chloride: 105 mEq/L (ref 98–109)
Creatinine: 0.8 mg/dL (ref 0.6–1.1)
GLUCOSE: 107 mg/dL (ref 70–140)
Potassium: 3.3 mEq/L — ABNORMAL LOW (ref 3.5–5.1)
SODIUM: 141 meq/L (ref 136–145)
TOTAL PROTEIN: 7.6 g/dL (ref 6.4–8.3)

## 2016-08-10 MED ORDER — DIPHENHYDRAMINE HCL 25 MG PO CAPS
50.0000 mg | ORAL_CAPSULE | Freq: Once | ORAL | Status: AC
  Administered 2016-08-10: 50 mg via ORAL

## 2016-08-10 MED ORDER — SODIUM CHLORIDE 0.9% FLUSH
10.0000 mL | INTRAVENOUS | Status: DC | PRN
  Administered 2016-08-10: 10 mL
  Filled 2016-08-10: qty 10

## 2016-08-10 MED ORDER — ACETAMINOPHEN 325 MG PO TABS
650.0000 mg | ORAL_TABLET | Freq: Once | ORAL | Status: AC
  Administered 2016-08-10: 650 mg via ORAL

## 2016-08-10 MED ORDER — TRASTUZUMAB CHEMO 150 MG IV SOLR
6.0000 mg/kg | Freq: Once | INTRAVENOUS | Status: AC
  Administered 2016-08-10: 420 mg via INTRAVENOUS
  Filled 2016-08-10: qty 20

## 2016-08-10 MED ORDER — SODIUM CHLORIDE 0.9 % IJ SOLN
10.0000 mL | INTRAMUSCULAR | Status: DC | PRN
Start: 2016-08-10 — End: 2016-08-10
  Administered 2016-08-10: 10 mL
  Filled 2016-08-10: qty 10

## 2016-08-10 MED ORDER — ZOLPIDEM TARTRATE 10 MG PO TABS: 10.0000 mg | ORAL_TABLET | Freq: Every evening | ORAL | 2 refills | Status: DC | PRN

## 2016-08-10 MED ORDER — DIPHENHYDRAMINE HCL 25 MG PO CAPS
ORAL_CAPSULE | ORAL | Status: AC
  Filled 2016-08-10: qty 2

## 2016-08-10 MED ORDER — ACETAMINOPHEN 325 MG PO TABS
ORAL_TABLET | ORAL | Status: AC
  Filled 2016-08-10: qty 2

## 2016-08-10 MED ORDER — SODIUM CHLORIDE 0.9 % IV SOLN
Freq: Once | INTRAVENOUS | Status: AC
  Administered 2016-08-10: 10:00:00 via INTRAVENOUS

## 2016-08-10 MED ORDER — HEPARIN SOD (PORK) LOCK FLUSH 100 UNIT/ML IV SOLN
500.0000 [IU] | Freq: Once | INTRAVENOUS | Status: AC | PRN
  Administered 2016-08-10: 500 [IU]
  Filled 2016-08-10: qty 5

## 2016-08-10 MED FILL — EXEMESTANE 25 MG TABLET: 25 | 30 days supply | Qty: 30 | Fill #0

## 2016-08-10 NOTE — Progress Notes (Signed)
[place]  [phone] [phone]  Clinic follow Up Note   Patient Care Team: Kristie Cowman, MD as PCP - General (Family Medicine) Stark Klein, MD as Consulting Physician (General Surgery) Truitt Merle, MD as Consulting Physician (Hematology) Arloa Koh, MD as Consulting Physician (Radiation Oncology) Mauro Kaufmann, RN as Registered Nurse Rockwell Germany, RN as Registered Nurse Sylvan Cheese, NP as Nurse Practitioner (Nurse Practitioner) 08/10/2016  CHIEF COMPLAINTS:  Follow up right breast cancer  Oncology History   Breast cancer [facility])   Staging form: Breast, AJCC 7th Edition     Pathologic stage from 11/25/2015: Stage IIA (T1c, N1a, cM0) - Unsigned     Pathologic stage from 11/25/2015: Stage IIA (T1c, N1a, cM0) - Unsigned Breast cancer of upper-outer quadrant of right female breast [facility])   Staging form: Breast, AJCC 7th Edition     Clinical stage from 09/15/2015: Stage IA (T1b, N0, M0) - Signed by Truitt Merle, MD on 12/16/2015     Pathologic stage from 11/25/2015: Stage IIA (T1c, N1a, cM0) - Signed by Truitt Merle, MD on 12/16/2015         Breast cancer of upper-outer quadrant of right female breast ([place])   09/07/2015 Mammogram    Diagnostic mammogram and the ultrasound of the right breast showed a 0.8 cm lobulated mass in the right breast 11 to 12:00 position 9 cm from the nipple. No other lesions or adenopathy.      09/08/2015 Initial Diagnosis    Breast cancer of upper-outer quadrant of right female breast ([place])      09/08/2015 Initial Biopsy    Right breast mass at the upper outer quadrant core needle biopsy showed invasive ductal carcinoma, grade 2-3, ductal carcinoma in situ.      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 Left breast ostectomy was negative for malignancy.      01/06/2016 - 04/20/2016 Chemotherapy    adjuvant chemotherapy TCH P, every 3 weeks, for a total of 6 cycles. Herceptin and Perjeta were held for last two cycles due to drop of her EF on echo       05/25/2016 - 07/11/2016 Radiation Therapy    Adjuvant breast radiation      06/08/2016 -  Chemotherapy    She started maintenance Herceptin every 3 weeks, after clearance from cardiology.       HISTORY OF PRESENTING ILLNESS:  Sandra James 50 y.o. female with past medical history of stage I left breast cancer, is here because of newly diagnosed right breast cancer. She presents to our multidisciplinary breast clinic by herself.  The right breast cancer was discovered by screening mammogram. She did not have any palpable mass, or any constitutional symptoms. She was diagnosed with stage I left breast cancer at age of 5, she had lumpectomy, radiation, and adjuvant chemotherapy and 5 years of tamoxifen. She was treated by Dr. Louann Sjogren in [place]. She moved to [facility] about year ago due to job change. She has been very compliant with annual screening mammogram.  She had hysterectomy and bilateral oophorectomy and  sphincterectomy 5 years ago for heavy bleeding.. She has been having hot flashes since then, moderate, but manageable. She is single, lives alone, works for 2 to [place] has to go up children who live in [place].  CURRENT THERAPY: maintenance Herceptin every 3 weeks   [place] returns for follow-up. Her headaches has much improved after a steroid injection to her cervical spine, she was felt to have tension headaches from her degenerative arthritis of cervical spine. No recent dizzy spells. She feels well overall. She has been working two jobs, quite busy. She  still has insomnia, but able to sleep for 7-8 hours which takes Ambien. No other new complaints.  MED ICAL HISTORY:  Past Medical History:  Diagnosis Date  . Allergy   . Anxiety   . Breast cancer ([place]) 09/07/15   right breast  . Breast cancer of upper-outer quadrant of right female breast ([place]) 09/09/2015  . Cancer [facility])    left brast lumpectomy   . Complication of anesthesia    slow to wake up- BP was low, fainted next day-  . Depression   . Diabetes mellitus without complication ([place])   . Fibroid   . GERD (gastroesophageal reflux disease)   . Infection    UTI  . MVP (mitral valve prolapse)   . Neuromuscular disorder ([place])    sciatic nerve paion left side/leg  . UTI (lower urinary tract infection)     SURGICAL HISTORY: Past Surgical History:  Procedure Laterality Date  . ABDOMINAL HYSTERECTOMY    . BREAST RECONSTRUCTION WITH PLACEMENT OF TISSUE EXPANDER AND FLEX HD (ACELLULAR HYDRATED DERMIS) Bilateral 11/25/2015   Procedure: BILATERAL IMMEDIATE BREAST RECONSTRUCTION WITH PLACEMENT OF TISSUE EXPANDERS AND FLEX HD (ACELLULAR HYDRATED DERMIS);  Surgeon: Wallace Going, DO;  Location: [place];  Service: Plastics;  Laterality: Bilateral;  . BREAST SURGERY  2004   left lumpectomy  . MASTECTOMY W/ SENTINEL NODE BIOPSY Bilateral 11/25/2015   Procedure: BILATERAL SKIN SPARING MASTECTOMIES WITH RIGHT SENTINEL LYMPH NODE BIOPSY;  Surgeon: Stark Klein, MD;  Location: [place];  Service: General;  Laterality: Bilateral;  . OOPHORECTOMY Bilateral   . PORTACATH PLACEMENT Left 12/20/2015   Procedure: INSERTION PORT-A-CATH, left chest;  Surgeon: Stark Klein, MD;  Location: [place];  Service: General;  Laterality: Left;    SOCIAL HISTORY: Social History   Social History  . Marital Status: Single     Spouse Name: N/A  . Number of Children: 2 children, 50 daughter and 60 yo son    . Years of Education: N/A   Occupational History  . She is a  Barista rep   Social History Main Topics  . Smoking status: Never Smoker   . Smokeless tobacco: Never Used  . Alcohol Use: Yes     Comment: 2-3/wk  . Drug Use: No  . Sexual Activity: Yes    Birth Control/ Protection: Surgical   Other Topics Concern  . Not on file   Social History Narrative    FAMILY HISTORY: Family History  Problem Relation Age of Onset  . Hypertension Mother   . Stroke Mother   . Alzheimer's disease Mother   . Heart disease Father   . Stroke Father   . Heart attack Father   . Breast cancer Sister 37    double mastectomy  . Other Sister     "stomach tumor that wrapped around reproductive organs"; dx. 39s; required partial hysterectomy  . Alzheimer's disease Maternal Grandmother   .  Alzheimer's disease Paternal Grandmother   . Other Other     had to have lymph nodes removed and radiation; dx. 63s  . Cancer Maternal Aunt     unspecified type; dx. <50  . Colon cancer Maternal Uncle     dx. 60s  . Breast cancer Cousin     dx. 34s    ALLERGIES:  is allergic to lisinopril; oxycodone; and percocet [oxycodone-acetaminophen].  MEDICATIONS:  Current Outpatient Prescriptions  Medication Sig Dispense Refill  . bisoprolol (ZEBETA) 5 MG tablet Take 0.5 tablets (2.5 mg total) by mouth at bedtime. 15 tablet 3  . exemestane (AROMASIN) 25 MG tablet Take 1 tablet (25 mg total) by mouth daily after breakfast. 30 tablet 1  . fluticasone (FLONASE) 50 MCG/ACT nasal spray Place 1 spray into both nostrils daily as needed for allergies.   0  . ibuprofen (ADVIL,MOTRIN) 800 MG tablet Take 800 mg by mouth daily as needed for headache or moderate pain.    . Iron-Vitamins (GERITOL) LIQD Take 30 mLs by mouth daily. Energy support    . losartan (COZAAR) 25 MG tablet Take 0.5 tablets (12.5 mg total) by mouth daily. 90 tablet 3  . mirtazapine (REMERON) 30 MG tablet Take 30 mg by mouth as needed. Reported on 03/30/2016  3  . Multiple Vitamin (MULTIVITAMIN) tablet Take 1 tablet by  mouth daily.    . traMADol (ULTRAM) 50 MG tablet Take 1-2 tablets (50-100 mg total) by mouth every 6 (six) hours as needed. 60 tablet 0  . zolpidem (AMBIEN) 10 MG tablet Take 1 tablet (10 mg total) by mouth at bedtime as needed for sleep. 30 tablet 2   No current facility-administered medications for this visit.    Facility-Administered Medications Ordered in Other Visits  Medication Dose Route Frequency Provider Last Rate Last Dose  . sodium chloride flush (NS) 0.9 % injection 10 mL  10 mL Intracatheter PRN Truitt Merle, MD   10 mL at 08/10/16 1110    REVIEW OF SYSTEMS:   Constitutional: Denies fevers, chills or abnormal night sweats Eyes: Denies blurriness of vision, double vision or watery eyes Ears, nose, mouth, throat, and face: Denies mucositis or sore throat Respiratory: Denies cough, dyspnea or wheezes Cardiovascular: Denies palpitation, chest discomfort or lower extremity swelling Gastrointestinal:  Denies nausea, heartburn or change in bowel habits Skin: Denies abnormal skin rashes Lymphatics: Denies new lymphadenopathy or easy bruising Neurological:Denies numbness, tingling or new weaknesses Behavioral/Psych: Mood is stable, no new changes  All other systems were reviewed with the patient and are negative.  PHYSICAL EXAMINATION: ECOG PERFORMANCE STATUS: 1  Vitals:   08/10/16 0929  BP: 125/80  Pulse: 66  Resp: 18  Temp: 98 F (36.7 C)   Filed Weights   08/10/16 0929  Weight: 144 lb 6.4 oz (65.5 kg)    GENERAL:alert, no distress and comfortable SKIN: skin color, texture, turgor are normal, no rashes or significant lesions EYES: normal, conjunctiva are pink and non-injected, sclera clear, eye lids appears normal OROPHARYNX:no exudate, no erythema and lips, buccal mucosa, and tongue normal  NECK: supple, thyroid normal size, non-tender, without nodularity LYMPH:  no palpable lymphadenopathy in the cervical, axillary or inguinal LUNGS: clear to auscultation and  percussion with normal breathing effort HEART: regular rate & rhythm and no murmurs and no lower extremity edema ABDOMEN:abdomen soft, non-tender and normal bowel sounds Musculoskeletal:no cyanosis of digits and no clubbing  PSYCH: alert & oriented x 3 with fluent speech NEURO: no focal motor/sensory deficits Breasts: s/p  bilateral mastectomy and tissue spender placement. Surgical incision sites are clean and well healed. (+) Diffuse skin pigmentation in the right breast, no skin ulcers.     LABORATORY DATA:  I have reviewed the data as listed CBC Latest Ref Rng & Units 08/10/2016 07/20/2016 06/29/2016  WBC 3.9 - 10.3 10e3/uL 5.8 4.6 4.3  Hemoglobin 11.6 - 15.9 g/dL 12.1 11.4(L) 11.6  Hematocrit 34.8 - 46.6 % 36.2 33.0(L) 34.8  Platelets 145 - 400 10e3/uL 278 227 207    CMP Latest Ref Rng & Units 08/10/2016 07/20/2016 06/29/2016  Glucose 70 - 140 mg/dl 107 107 97  BUN 7.0 - 26.0 mg/dL 9.9 9.5 12.6  Creatinine 0.6 - 1.1 mg/dL 0.8 0.7 0.7  Sodium 136 - 145 mEq/L 141 141 140  Potassium 3.5 - 5.1 mEq/L 3.3(L) 3.3(L) 3.6  Chloride 101 - 111 mmol/L - - -  CO2 22 - 29 mEq/L 25 26 26   Calcium 8.4 - 10.4 mg/dL 9.4 9.5 9.6  Total Protein 6.4 - 8.3 g/dL 7.6 7.6 7.6  Total Bilirubin 0.20 - 1.20 mg/dL 0.85 0.84 0.99  Alkaline Phos 40 - 150 U/L 73 65 63  AST 5 - 34 U/L 14 15 15   ALT 0 - 55 U/L 12 18 13      Pathology report Diagnosis 11/25/2015 1. Breast, simple mastectomy, right - INVASIVE DUCTAL CARCINOMA, GRADE 2/3, SPANNING 2.0 CM. - DUCTAL CARCINOMA IN SITU, INTERMEDIATE GRADE. - LYMPHOVASCULAR INVASION IS IDENTIFIED. - LOBULAR NEOPLASIA (ATYPICAL LOBULAR HYPERPLASIA). - THE SURGICAL RESECTION MARGINS ARE NEGATIVE FOR CARCINOMA. - SEE ONCOLOGY TABLE BELOW. 2. Lymph node, sentinel, biopsy, right axillary #1 - METASTATIC CARCINOMA IN 1 OF 1 LYMPH NODE (1/1). 3. Lymph node, sentinel, biopsy, right axillary #2 - THERE IS NO EVIDENCE OF CARCINOMA IN 1 OF 1 LYMPH NODE (0/1). 4. Lymph node,  sentinel, biopsy, right axillary #3 - THERE IS NO EVIDENCE OF CARCINOMA IN 1 OF 1 LYMPH NODE (0/1). 5. Breast, simple mastectomy, left - BENIGN BREAST PARENCHYMA WITH DENSE STROMAL FIBROSIS. - FIBROADENOMA. - THERE IS NO EVIDENCE OF MALIGNANCY. 6. Breast, excision, left, additional lateral margin - BENIGN FIBROADIPOSE TISSUE. - THERE IS NO EVIDENCE OF MALIGNANCY. - SEE COMMENT. Microscopic Comment 1. BREAST, INVASIVE TUMOR, WITH LYMPH NODES PRESENT Specimen, including laterality and lymph node sampling (sentinel, non-sentinel): Right breast and right axillary lymph nodes Procedure: Bilateral mastectomy and multiple right axillary lymph node resections Histologic type: Ductal Grade: 2 Tubule formation: 2 1 of 4 FINAL for [place], Jameya ([id number]) Microscopic Comment(continued) Nuclear pleomorphism: 2 Mitotic: 2 Tumor size (gross measurement): 2.0 cm Margins: Negative for carcinoma Invasive, distance to closest margin: 1.8 cm to the posterior margin In-situ, distance to closest margin: 1.8 cm to the posterior margin Lymphovascular invasion: Present Ductal carcinoma in situ: Present Grade: Intermediate grade Extensive intraductal component: Not identified Lobular neoplasia: Present, atypical lobular hyperplasia Tumor focality: Unifocal Treatment effect: Not identified Extent of tumor: Confined to breast parenchyma Lymph nodes: Examined: 3 Sentinel 0 Non-sentinel 3 Total Lymph nodes with metastasis: 1 (macrometastasis) - no extracapsular extension is identified. Breast prognostic profile: [phone] Estrogen receptor: 100%, strong staining intensity Progesterone receptor: 100%, strong staining intensity Her 2 neu: Amplification was detected. The ratio was 2.58 Ki-67: 60% TNM: pT1c, pN1a Non-neoplastic breast: No significant findings. 6. The surgical resection margin(s) of the specimen were inked and microscopically evaluated. Enid Cutter MD Pathologist, Electronic  Signature (Case signed 11/29/2015)   RADIOGRAPHIC STUDIES: I have personally reviewed the radiological images as listed and agreed with the  findings in the report.  Bone scan 12/28/2014 IMPRESSION: Foci of increased tracer localization at the sternum, unable to exclude metastases.  Uptake at inferior cervical spine could potentially be related to degenerative disc disease changes present at C5-C6 on CT.  Questionable abnormal increased tracer localization at the posterior LEFT ninth and tenth ribs.  CT chest, abdomen and pelvis 12/28/2014 IMPRESSION: 1. 2 cm enhancing lesion in segment 5 of the liver is indeterminate. It could reflect FNH, vascular shunt and less likely metastasis. Recommend MRI abdomen without and with contrast using Eovist for further evaluation. No other hepatic lesions. 2. No findings for metastatic disease involving the chest. There is a focal area of airspace disease in the superior segment of the right lower lobe which could be a developing or resolving infiltrate. Recommend correlation with clinical symptoms.  ECHO 08/02/2016 Impressions:  - Normal LV size with EF 55%. Strain as noted above. Normal RV size   and systolic function.  ASSESSMENT & PLAN:  50 year old African-American female, surgical postmenopausal, history of stage I left breast cancer, with newly diagnosed right breast cancer.  1. Right breast invasive ductal carcinoma, pT1cN1aM0, stage IIB, grade 2, ER100%+/PR100%+/HER2+ -I previously reviewed her surgical pathology results with patient in great details. She has 1 out of 3 sentinel lymph nodes positive, her pathological stage is more advanced than her initial clinical stage.  -We reviewed the natural history of triple positive breast cancer. HER-2 positive tumors are more progressive, with higher risk of recurrence -I reviewed her restaging CT scan and bone scan images with her in person. I discussed the bone scan findings with  radiologist Dr. Thornton Papas. The mild hypermetabolic uptake in the sternum and left ninth and 10th rib have no significant corresponding change on the CT scan, except for small lytic spot in the sternum. They're not amenable for biopsy, PET scan may not have much additional value, the suspicion for metastases from breast cancer is low. I recommend follow-up. The 2 cm lesion in the liver are likely benign, unfortunately she is not able to do liver MRI, due to her breast implants.  -She is still concerned about the above scan findings. I recommend to have a repeated CT or PET scan a few months after she completes adjuvant chemotherapy for follow-up. -Given her high risk of cancer recurrence, I recommend adjuvant chemotherapy with docetaxel, carboplatin, Herceptin and Perjeta (TCHP), every 3 weeks for 6 cycles,  followed by maintenance Herceptin to complete a 1 year therapy. She previously received Adriamycin, will not be able to tolerate more adrimycin. Herceptin and pejeta was held for the last 2 cycle chemotherapy, due to her decreased EF. -Her repeated echo has showed normal EF, she has restarted Herceptin maintenance therapy. -She  has now completed adjuvant breast and axilla radiation and tolerated well. -I recommend her to try exemestane, she did not tolerate letrozole previously. She agrees, we'll pick up her prescription and starting a few days -Lab results reviewed with her, her recent repeat echo is normal, we'll continue Herceptin maintenance therapy  2. Genetics -Due to her young age and recurrent breast cancer, she was referred to see a genetic counselor in our cancer center. -Her genetic test was negative  3. DM -she will continue follow-up with her primary care physician -She is on metformin  4. Bone health -She is postmenopausal by surgery -I encouraged her to take calcium and vitamin D -I'll obtain a baseline bone density scan in the next few months   6. Insomnia and low  appetite    -continue mirtazapine -she has not been able to wean off ambien, will continue for now   7. Headaches -Secondary to her cervical spine degenerative arthritis -Improved after steroids injection   Plan for today  -Lab reviewed, continue Herceptin. -She'll start exemestane in a few days -bone density scan in the next month  -I will see her back in 3 weeks, will order repeated bone scan and CT   All questions were answered. The patient knows to call the clinic with any problems, questions or concerns.  I spent 25 minutes counseling the patient face to face. The total time spent in the appointment was 30 minutes and more than 50% was on counseling.     Truitt Merle, MD 08/10/2016

## 2016-08-10 NOTE — Patient Instructions (Signed)
[facility] Discharge Instructions for Patients Receiving Chemotherapy  Today you received the following chemotherapy agents: Herceptin   To help prevent nausea and vomiting after your treatment, we encourage you to take your nausea medication as directed.    If you develop nausea and vomiting that is not controlled by your nausea medication, call the clinic.   BELOW ARE SYMPTOMS THAT SHOULD BE REPORTED IMMEDIATELY:  *FEVER GREATER THAN 100.5 F  *CHILLS WITH OR WITHOUT FEVER  NAUSEA AND VOMITING THAT IS NOT CONTROLLED WITH YOUR NAUSEA MEDICATION  *UNUSUAL SHORTNESS OF BREATH  *UNUSUAL BRUISING OR BLEEDING  TENDERNESS IN MOUTH AND THROAT WITH OR WITHOUT PRESENCE OF ULCERS  *URINARY PROBLEMS  *BOWEL PROBLEMS  UNUSUAL RASH Items with * indicate a potential emergency and should be followed up as soon as possible.  Feel free to call the clinic you have any questions or concerns. The clinic phone number is [phone].  Please show the CHEMO ALERT CARD at check-in to the Emergency Department and triage nurse.   

## 2016-08-11 MED FILL — ZOLPIDEM TARTRATE 10 MG TAB: 10 | 30 days supply | Qty: 30 | Fill #0

## 2016-08-15 ENCOUNTER — Ambulatory Visit
Admission: RE | Admit: 2016-08-15 | Discharge: 2016-08-15 | Disposition: A | Discharge status: Home / Self Care | Payer: BLUE CROSS/BLUE SHIELD | Source: Ambulatory Visit | Attending: Radiation Oncology | Admitting: Radiation Oncology

## 2016-08-15 VITALS — BP 94/66 | HR 102 | Temp 98.6°F | Resp 16 | Ht 66.0 in | Wt 141.6 lb

## 2016-08-15 DIAGNOSIS — C50911 Malignant neoplasm of unspecified site of right female breast: Secondary | ICD-10-CM | POA: Insufficient documentation

## 2016-08-15 DIAGNOSIS — C50411 Malignant neoplasm of upper-outer quadrant of right female breast: Secondary | ICD-10-CM

## 2016-08-15 DIAGNOSIS — Z170 Estrogen receptor positive status [ER+]: Secondary | ICD-10-CM | POA: Diagnosis not present

## 2016-08-15 NOTE — Progress Notes (Signed)
Ms. Fernholz is here for a one month followup visit.  Skin to right breasr with mild hypermentation using coconut oil bid to right breast.  Appetite is good.  Denies pain and fatigue. Started anti-estrogen Aromasin this past weekend.  Will see Dr. Burr Medico 08-31-16.  Survivorship has not been scheduled. Wt Readings from Last 3 Encounters:  08/15/16 141 lb 9.6 oz (64.2 kg)  08/10/16 144 lb 6.4 oz (65.5 kg)  08/02/16 144 lb 8 oz (65.5 kg)  BP 94/66 (BP Location: Left Arm, Patient Position: Sitting, Cuff Size: Normal)   Pulse (!) 102   Temp 98.6 F (37 C) (Oral)   Resp 16   Ht 5\' 6"  (1.676 m)   Wt 141 lb 9.6 oz (64.2 kg)   SpO2 99%   BMI 22.85 kg/m

## 2016-08-15 NOTE — Addendum Note (Signed)
Encounter addended by: Benn Moulder, RN on: 08/15/2016 11:53 AM<BR>    Actions taken: Charge Capture section accepted

## 2016-08-15 NOTE — Progress Notes (Signed)
Radiation Oncology         [phone]) [phone] ________________________________  Name: Sandra James MRN: [id number]  Date: 08/15/2016  DOB: May 14, 1966  Post Treatment Note  CC: Andria Frames, MD  Stark Klein, MD  Diagnosis:  Stage IIB, [phone], ER/PR/HER2 positive grade 2 invasive ductal carcinoma of the right breast.  Interval Since Last Radiation: 4 weeks   05/25/2016 to 07/11/2016:  1. The Right chest wall (4-field) was treated to 50.4 Gy in 28 fractions at 1.8 Gy per fraction. 2. The Right chest wall was boosted to 10 Gy in 5 fractions at 2 Gy per fraction. 3. The Right supraclavicular region was treated to 50.4 Gy in 28 fractions at 1.8 Gy per fraction.  Narrative:  The patient returns today for routine follow-up.  During the course of treatment, she did well in tolerating mild fatigue and did not develop desquamation.                            On review of systems, the patient states she is doing pretty well overall. She is currently using cocaine and oil on her skin and feels as though this is improved the texture. From time to time she will notice crampy discomfort along the chest wall, she denies any concerns with sternal chest pain, fevers or chills.  ALLERGIES:  is allergic to lisinopril; oxycodone; and percocet [oxycodone-acetaminophen].  Meds: Current Outpatient Prescriptions  Medication Sig Dispense Refill  . bisoprolol (ZEBETA) 5 MG tablet Take 0.5 tablets (2.5 mg total) by mouth at bedtime. 15 tablet 3  . exemestane (AROMASIN) 25 MG tablet Take 1 tablet (25 mg total) by mouth daily after breakfast. 30 tablet 1  . Iron-Vitamins (GERITOL) LIQD Take 30 mLs by mouth daily. Energy support    . losartan (COZAAR) 25 MG tablet Take 0.5 tablets (12.5 mg total) by mouth daily. 90 tablet 3  . Multiple Vitamin (MULTIVITAMIN) tablet Take 1 tablet by mouth daily.    Marland Kitchen zolpidem (AMBIEN) 10 MG tablet Take 1 tablet (10 mg total) by mouth at bedtime as needed for sleep. 30 tablet 2  .  fluticasone (FLONASE) 50 MCG/ACT nasal spray Place 1 spray into both nostrils daily as needed for allergies.   0  . ibuprofen (ADVIL,MOTRIN) 800 MG tablet Take 800 mg by mouth daily as needed for headache or moderate pain.    . mirtazapine (REMERON) 30 MG tablet Take 30 mg by mouth as needed. Reported on 03/30/2016  3  . traMADol (ULTRAM) 50 MG tablet Take 1-2 tablets (50-100 mg total) by mouth every 6 (six) hours as needed. (Patient not taking: Reported on 08/15/2016) 60 tablet 0   No current facility-administered medications for this encounter.     Physical Findings:  height is 5' 6"  (1.676 m) and weight is 141 lb 9.6 oz (64.2 kg). Her oral temperature is 98.6 F (37 C). Her blood pressure is 94/66 and her pulse is 102 (abnormal). Her respiration is 16 and oxygen saturation is 99%.  In general this is a well appearing African American female in no acute distress. She's alert and oriented x4 and appropriate throughout the examination. Cardiopulmonary assessment is negative for acute distress and she exhibits normal effort. Her right chest wall is evaluated and reveals well-healed mastectomy scars with tissue expanders bilaterally. There is hyperpigmentation along the chest wall consistent with previous radiotherapy but no evidence of desquamation.  Lab Findings: Lab Results  Component Value Date  WBC 5.8 08/10/2016   HGB 12.1 08/10/2016   HCT 36.2 08/10/2016   MCV 93.5 08/10/2016   PLT 278 08/10/2016     Radiographic Findings: No results found.  Impression/Plan: 1. Stage IIB, T1cN1aM0, ER/PR/HER2 positive grade 2 invasive ductal carcinoma of the right breast.the patient has plans to continue on systemic therapy under the care of Dr. Burr Medico. She's also just started Aromasin. We discussed the importance of close follow-up evaluation with Dr. Burr Medico. The course of her treatment and following treatment survivorship. 2. Survivorship. As above, we have discussed the importance of the visit, and she  will be referred for this surveillance following completion of her adjuvant therapy.      Carola Rhine, PAC

## 2016-08-16 ENCOUNTER — Encounter: Payer: Self-pay | Admitting: Hematology

## 2016-08-16 NOTE — Progress Notes (Signed)
patient left forms with me- left for dr. Burr Medico to sign

## 2016-08-16 NOTE — Progress Notes (Signed)
patient left forms with me- left for dr. Burr Medico to sign-faxed bb&t [phone] and mailed copy to patient-sent to medical [facility]

## 2016-08-31 ENCOUNTER — Encounter: Payer: Self-pay | Admitting: Hematology

## 2016-08-31 ENCOUNTER — Ambulatory Visit (HOSPITAL_BASED_OUTPATIENT_CLINIC_OR_DEPARTMENT_OTHER): Payer: BLUE CROSS/BLUE SHIELD | Admitting: Hematology

## 2016-08-31 ENCOUNTER — Ambulatory Visit (HOSPITAL_BASED_OUTPATIENT_CLINIC_OR_DEPARTMENT_OTHER): Payer: BLUE CROSS/BLUE SHIELD

## 2016-08-31 ENCOUNTER — Other Ambulatory Visit (HOSPITAL_BASED_OUTPATIENT_CLINIC_OR_DEPARTMENT_OTHER): Payer: BLUE CROSS/BLUE SHIELD

## 2016-08-31 ENCOUNTER — Ambulatory Visit: Payer: BLUE CROSS/BLUE SHIELD

## 2016-08-31 ENCOUNTER — Telehealth: Payer: Self-pay | Admitting: Hematology

## 2016-08-31 VITALS — BP 110/68 | HR 86 | Temp 98.6°F | Resp 18 | Ht 66.0 in | Wt 139.4 lb

## 2016-08-31 DIAGNOSIS — C50411 Malignant neoplasm of upper-outer quadrant of right female breast: Secondary | ICD-10-CM | POA: Diagnosis not present

## 2016-08-31 DIAGNOSIS — E119 Type 2 diabetes mellitus without complications: Secondary | ICD-10-CM | POA: Diagnosis not present

## 2016-08-31 DIAGNOSIS — G4700 Insomnia, unspecified: Secondary | ICD-10-CM | POA: Diagnosis not present

## 2016-08-31 DIAGNOSIS — Z170 Estrogen receptor positive status [ER+]: Secondary | ICD-10-CM

## 2016-08-31 DIAGNOSIS — Z5111 Encounter for antineoplastic chemotherapy: Secondary | ICD-10-CM

## 2016-08-31 DIAGNOSIS — Z853 Personal history of malignant neoplasm of breast: Secondary | ICD-10-CM

## 2016-08-31 DIAGNOSIS — R51 Headache: Secondary | ICD-10-CM | POA: Diagnosis not present

## 2016-08-31 LAB — CBC WITH DIFFERENTIAL/PLATELET
BASO%: 0.2 % (ref 0.0–2.0)
BASOS ABS: 0 10*3/uL (ref 0.0–0.1)
EOS ABS: 0.1 10*3/uL (ref 0.0–0.5)
EOS%: 2.3 % (ref 0.0–7.0)
HCT: 34.7 % — ABNORMAL LOW (ref 34.8–46.6)
HEMOGLOBIN: 12 g/dL (ref 11.6–15.9)
LYMPH#: 0.8 10*3/uL — AB (ref 0.9–3.3)
LYMPH%: 19.3 % (ref 14.0–49.7)
MCH: 31.1 pg (ref 25.1–34.0)
MCHC: 34.6 g/dL (ref 31.5–36.0)
MCV: 89.9 fL (ref 79.5–101.0)
MONO#: 0.4 10*3/uL (ref 0.1–0.9)
MONO%: 8.8 % (ref 0.0–14.0)
NEUT%: 69.4 % (ref 38.4–76.8)
NEUTROS ABS: 3 10*3/uL (ref 1.5–6.5)
Platelets: 234 10*3/uL (ref 145–400)
RBC: 3.86 10*6/uL (ref 3.70–5.45)
RDW: 13 % (ref 11.2–14.5)
WBC: 4.3 10*3/uL (ref 3.9–10.3)

## 2016-08-31 LAB — COMPREHENSIVE METABOLIC PANEL
ALBUMIN: 3.9 g/dL (ref 3.5–5.0)
ALK PHOS: 78 U/L (ref 40–150)
ALT: 11 U/L (ref 0–55)
ANION GAP: 11 meq/L (ref 3–11)
AST: 13 U/L (ref 5–34)
BILIRUBIN TOTAL: 1.04 mg/dL (ref 0.20–1.20)
BUN: 8 mg/dL (ref 7.0–26.0)
CALCIUM: 9.7 mg/dL (ref 8.4–10.4)
CO2: 25 mEq/L (ref 22–29)
CREATININE: 0.8 mg/dL (ref 0.6–1.1)
Chloride: 106 mEq/L (ref 98–109)
Glucose: 91 mg/dl (ref 70–140)
Potassium: 3.7 mEq/L (ref 3.5–5.1)
Sodium: 141 mEq/L (ref 136–145)
TOTAL PROTEIN: 8 g/dL (ref 6.4–8.3)

## 2016-08-31 MED ORDER — SODIUM CHLORIDE 0.9 % IV SOLN
Freq: Once | INTRAVENOUS | Status: AC
  Administered 2016-08-31: 11:00:00 via INTRAVENOUS

## 2016-08-31 MED ORDER — ACETAMINOPHEN 325 MG PO TABS
ORAL_TABLET | ORAL | Status: AC
  Filled 2016-08-31: qty 2

## 2016-08-31 MED ORDER — SODIUM CHLORIDE 0.9% FLUSH
10.0000 mL | INTRAVENOUS | Status: DC | PRN
  Administered 2016-08-31: 10 mL
  Filled 2016-08-31: qty 10

## 2016-08-31 MED ORDER — HEPARIN SOD (PORK) LOCK FLUSH 100 UNIT/ML IV SOLN
500.0000 [IU] | Freq: Once | INTRAVENOUS | Status: AC | PRN
  Administered 2016-08-31: 500 [IU]
  Filled 2016-08-31: qty 5

## 2016-08-31 MED ORDER — TRASTUZUMAB CHEMO 150 MG IV SOLR
6.0000 mg/kg | Freq: Once | INTRAVENOUS | Status: AC
  Administered 2016-08-31: 420 mg via INTRAVENOUS
  Filled 2016-08-31: qty 20

## 2016-08-31 MED ORDER — GABAPENTIN 100 MG PO CAPS: 100.0000 mg | ORAL_CAPSULE | Freq: Every day | ORAL | 1 refills | Status: DC

## 2016-08-31 MED ORDER — DIPHENHYDRAMINE HCL 25 MG PO CAPS
50.0000 mg | ORAL_CAPSULE | Freq: Once | ORAL | Status: AC
Start: 2016-08-31 — End: 2016-08-31
  Administered 2016-08-31: 50 mg via ORAL

## 2016-08-31 MED ORDER — DIPHENHYDRAMINE HCL 25 MG PO CAPS
ORAL_CAPSULE | ORAL | Status: AC
  Filled 2016-08-31: qty 2

## 2016-08-31 MED ORDER — ACETAMINOPHEN 325 MG PO TABS
650.0000 mg | ORAL_TABLET | Freq: Once | ORAL | Status: AC
  Administered 2016-08-31: 650 mg via ORAL

## 2016-08-31 MED ORDER — INFLUENZA VAC SPLIT QUAD 0.5 ML IM SUSY
0.5000 mL | PREFILLED_SYRINGE | Freq: Once | INTRAMUSCULAR | Status: DC
  Filled 2016-08-31: qty 0.5

## 2016-08-31 MED ORDER — SODIUM CHLORIDE 0.9 % IJ SOLN
10.0000 mL | INTRAMUSCULAR | Status: DC | PRN
  Administered 2016-08-31: 10 mL
  Filled 2016-08-31: qty 10

## 2016-08-31 MED FILL — GABAPENTIN 100 MG CAPSULE: 100 | 30 days supply | Qty: 60 | Fill #0

## 2016-08-31 NOTE — Addendum Note (Signed)
Addended by: Margaret Pyle on: 08/31/2016 01:42 PM   Modules accepted: Orders

## 2016-08-31 NOTE — Patient Instructions (Signed)
Cancer Center Discharge Instructions for Patients Receiving Chemotherapy  Today you received the following chemotherapy agents herceptin. To help prevent nausea and vomiting after your treatment, we encourage you to take your nausea medication as directed.  If you develop nausea and vomiting that is not controlled by your nausea medication, call the clinic.   BELOW ARE SYMPTOMS THAT SHOULD BE REPORTED IMMEDIATELY:  *FEVER GREATER THAN 100.5 F  *CHILLS WITH OR WITHOUT FEVER  NAUSEA AND VOMITING THAT IS NOT CONTROLLED WITH YOUR NAUSEA MEDICATION  *UNUSUAL SHORTNESS OF BREATH  *UNUSUAL BRUISING OR BLEEDING  TENDERNESS IN MOUTH AND THROAT WITH OR WITHOUT PRESENCE OF ULCERS  *URINARY PROBLEMS  *BOWEL PROBLEMS  UNUSUAL RASH Items with * indicate a potential emergency and should be followed up as soon as possible.  Feel free to call the clinic you have any questions or concerns. The clinic phone number is [phone].  Please show the CHEMO ALERT CARD at check-in to the Emergency Department and triage nurse.    

## 2016-08-31 NOTE — Telephone Encounter (Signed)
Gave patient avs report and appointments for October and November.  °

## 2016-08-31 NOTE — Addendum Note (Signed)
Addended by: Margaret Pyle on: 08/31/2016 01:46 PM   Modules accepted: Orders

## 2016-08-31 NOTE — Progress Notes (Signed)
OK for flu vaccine today per Dr. Burr Medico

## 2016-08-31 NOTE — Progress Notes (Signed)
[facility]  [phone] [phone]  Clinic follow Up Note   Patient Care Team: Kristie Cowman, MD as PCP - General (Family Medicine) Stark Klein, MD as Consulting Physician (General Surgery) Truitt Merle, MD as Consulting Physician (Hematology) Arloa Koh, MD as Consulting Physician (Radiation Oncology) Mauro Kaufmann, RN as Registered Nurse Rockwell Germany, RN as Registered Nurse Sylvan Cheese, NP as Nurse Practitioner (Nurse Practitioner) 08/31/2016  CHIEF COMPLAINTS:  Follow up right breast cancer  Oncology History   Breast cancer [facility])   Staging form: Breast, AJCC 7th Edition     Pathologic stage from 11/25/2015: Stage IIA (T1c, N1a, cM0) - Unsigned     Pathologic stage from 11/25/2015: Stage IIA (T1c, N1a, cM0) - Unsigned Breast cancer of upper-outer quadrant of right female breast [facility])   Staging form: Breast, AJCC 7th Edition     Clinical stage from 09/15/2015: Stage IA (T1b, N0, M0) - Signed by Truitt Merle, MD on 12/16/2015     Pathologic stage from 11/25/2015: Stage IIA (T1c, N1a, cM0) - Signed by Truitt Merle, MD on 12/16/2015         Breast cancer of upper-outer quadrant of right female breast ([place])   09/07/2015 Mammogram    Diagnostic mammogram and the ultrasound of the right breast showed a 0.8 cm lobulated mass in the right breast 11 to 12:00 position 9 cm from the nipple. No other lesions or adenopathy.      09/08/2015 Initial Diagnosis    Breast cancer of upper-outer quadrant of right female breast ([place])      09/08/2015 Initial Biopsy    Right breast mass at the upper outer quadrant core needle biopsy showed invasive ductal carcinoma, grade 2-3, ductal carcinoma in situ.      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 Left breast ostectomy was negative for malignancy.      01/06/2016 - 04/20/2016 Chemotherapy    adjuvant chemotherapy TCH P, every 3 weeks, for a total of 6 cycles. Herceptin and Perjeta were held for last two cycles due to drop of her EF on echo       05/25/2016 - 07/11/2016 Radiation Therapy    Adjuvant breast radiation      06/08/2016 -  Chemotherapy    She started maintenance Herceptin every 3 weeks, after clearance from cardiology.       HISTORY OF PRESENTING ILLNESS:  Sandra James 50 y.o. female with past medical history of stage I left breast cancer, is here because of newly diagnosed right breast cancer. She presents to our multidisciplinary breast clinic by herself.  The right breast cancer was discovered by screening mammogram. She did not have any palpable mass, or any constitutional symptoms. She was diagnosed with stage I left breast cancer at age of 27, she had lumpectomy, radiation, and adjuvant chemotherapy and 5 years of tamoxifen. She was treated by Dr. Louann Sjogren in [place]. She moved to [facility] about year ago due to job change. She has been very compliant with annual screening mammogram.  She had hysterectomy and bilateral oophorectomy and  sphincterectomy 5 years ago for heavy bleeding.. She has been having hot flashes since then, moderate, but manageable. She is single, lives alone, works for 2 to [employer] has to go up children who live in [place].  CURRENT THERAPY:  1. maintenance Herceptin every 3 weeks  2. Exemestane 25 mg daily, started on 08/11/2016  [name] returns for follow-up And Herceptin therapy. She is doing well overall. She has been on exemestane for 2-3 weeks, she has been tolerating well overall. She has moderate hot flash, slightly worse since she started exemestane, but  overall manageable. She still has mild intermittent headache, overall better than before. Her appetite and energy level remains well, no other new complaints.  MED ICAL HISTORY:  Past Medical History:  Diagnosis Date  . Allergy   . Anxiety   . Breast cancer ([place]) 09/07/15   right breast  . Breast cancer of upper-outer quadrant of right female breast ([place]) 09/09/2015  . Cancer [facility])    left brast lumpectomy   . Complication of anesthesia    slow to wake up- BP was low, fainted next day-  . Depression   . Diabetes mellitus without complication ([place])   . Fibroid   . GERD (gastroesophageal reflux disease)   . Infection    UTI  . MVP (mitral valve prolapse)   . Neuromuscular disorder ([place])    sciatic nerve paion left side/leg  . UTI (lower urinary tract infection)     SURGICAL HISTORY: Past Surgical History:  Procedure Laterality Date  . ABDOMINAL HYSTERECTOMY    . BREAST RECONSTRUCTION WITH PLACEMENT OF TISSUE EXPANDER AND FLEX HD (ACELLULAR HYDRATED DERMIS) Bilateral 11/25/2015   Procedure: BILATERAL IMMEDIATE BREAST RECONSTRUCTION WITH PLACEMENT OF TISSUE EXPANDERS AND FLEX HD (ACELLULAR HYDRATED DERMIS);  Surgeon: Wallace Going, DO;  Location: [facility];  Service: Plastics;  Laterality: Bilateral;  . BREAST SURGERY  2004   left lumpectomy  . MASTECTOMY W/ SENTINEL NODE BIOPSY Bilateral 11/25/2015   Procedure: BILATERAL SKIN SPARING MASTECTOMIES WITH RIGHT SENTINEL LYMPH NODE BIOPSY;  Surgeon: Stark Klein, MD;  Location: [facility];  Service: General;  Laterality: Bilateral;  . OOPHORECTOMY Bilateral   . PORTACATH PLACEMENT Left 12/20/2015   Procedure: INSERTION PORT-A-CATH, left chest;  Surgeon: Stark Klein, MD;  Location: [facility];  Service: General;  Laterality: Left;    SOCIAL HISTORY: Social History   Social History  . Marital Status: Single     Spouse Name: N/A  . Number of Children: 2 children, 36 daughter and 33 yo son    . Years  of Education: N/A   Occupational History  . She is a Barista rep   Social History Main Topics  . Smoking status: Never Smoker   . Smokeless tobacco: Never Used  . Alcohol Use: Yes     Comment: 2-3/wk  . Drug Use: No  . Sexual Activity: Yes    Birth Control/ Protection: Surgical   Other Topics Concern  . Not on file   Social History Narrative    FAMILY HISTORY: Family History  Problem Relation Age of Onset  . Hypertension Mother   . Stroke Mother   . Alzheimer's disease Mother   . Heart disease Father   . Stroke Father   . Heart attack Father   . Breast cancer Sister 30    double mastectomy  . Other Sister     "stomach tumor that wrapped around reproductive organs"; dx. 41s; required partial hysterectomy  .  Alzheimer's disease Maternal Grandmother   . Alzheimer's disease Paternal Grandmother   . Other Other     had to have lymph nodes removed and radiation; dx. 96s  . Cancer Maternal Aunt     unspecified type; dx. <50  . Colon cancer Maternal Uncle     dx. 11s  . Breast cancer Cousin     dx. 17s    ALLERGIES:  is allergic to lisinopril; oxycodone; and percocet [oxycodone-acetaminophen].  MEDICATIONS:  Current Outpatient Prescriptions  Medication Sig Dispense Refill  . bisoprolol (ZEBETA) 5 MG tablet Take 0.5 tablets (2.5 mg total) by mouth at bedtime. 15 tablet 3  . exemestane (AROMASIN) 25 MG tablet Take 1 tablet (25 mg total) by mouth daily after breakfast. 30 tablet 1  . fluticasone (FLONASE) 50 MCG/ACT nasal spray Place 1 spray into both nostrils daily as needed for allergies.   0  . ibuprofen (ADVIL,MOTRIN) 800 MG tablet Take 800 mg by mouth daily as needed for headache or moderate pain.    . Iron-Vitamins (GERITOL) LIQD Take 30 mLs by mouth daily. Energy support    . losartan (COZAAR) 25 MG tablet Take 0.5 tablets (12.5 mg total) by mouth daily. 90 tablet 3  . mirtazapine (REMERON) 30 MG tablet Take 30 mg by mouth as needed. Reported on 03/30/2016  3  .  Multiple Vitamin (MULTIVITAMIN) tablet Take 1 tablet by mouth daily.    . traMADol (ULTRAM) 50 MG tablet Take 1-2 tablets (50-100 mg total) by mouth every 6 (six) hours as needed. (Patient not taking: Reported on 08/15/2016) 60 tablet 0  . zolpidem (AMBIEN) 10 MG tablet Take 1 tablet (10 mg total) by mouth at bedtime as needed for sleep. 30 tablet 2   No current facility-administered medications for this visit.     REVIEW OF SYSTEMS:   Constitutional: Denies fevers, chills or abnormal night sweats Eyes: Denies blurriness of vision, double vision or watery eyes Ears, nose, mouth, throat, and face: Denies mucositis or sore throat Respiratory: Denies cough, dyspnea or wheezes Cardiovascular: Denies palpitation, chest discomfort or lower extremity swelling Gastrointestinal:  Denies nausea, heartburn or change in bowel habits Skin: Denies abnormal skin rashes Lymphatics: Denies new lymphadenopathy or easy bruising Neurological:Denies numbness, tingling or new weaknesses Behavioral/Psych: Mood is stable, no new changes  All other systems were reviewed with the patient and are negative.  PHYSICAL EXAMINATION: ECOG PERFORMANCE STATUS: 1  Vitals:   08/31/16 1023  BP: 110/68  Pulse: 86  Resp: 18  Temp: 98.6 F (37 C)   Filed Weights   08/31/16 1023  Weight: 139 lb 6.4 oz (63.2 kg)    GENERAL:alert, no distress and comfortable SKIN: skin color, texture, turgor are normal, no rashes or significant lesions EYES: normal, conjunctiva are pink and non-injected, sclera clear, eye lids appears normal OROPHARYNX:no exudate, no erythema and lips, buccal mucosa, and tongue normal  NECK: supple, thyroid normal size, non-tender, without nodularity LYMPH:  no palpable lymphadenopathy in the cervical, axillary or inguinal LUNGS: clear to auscultation and percussion with normal breathing effort HEART: regular rate & rhythm and no murmurs and no lower extremity edema ABDOMEN:abdomen soft, non-tender  and normal bowel sounds Musculoskeletal:no cyanosis of digits and no clubbing  PSYCH: alert & oriented x 3 with fluent speech NEURO: no focal motor/sensory deficits Breasts: s/p bilateral mastectomy and tissue spender placement. Surgical incision sites are clean and well healed. (+) Diffuse skin pigmentation in the right breast, no skin ulcers.     LABORATORY DATA:  I have reviewed the data as listed CBC Latest Ref Rng & Units 08/31/2016 08/10/2016 07/20/2016  WBC 3.9 - 10.3 10e3/uL 4.3 5.8 4.6  Hemoglobin 11.6 - 15.9 g/dL 12.0 12.1 11.4(L)  Hematocrit 34.8 - 46.6 % 34.7(L) 36.2 33.0(L)  Platelets 145 - 400 10e3/uL 234 278 227    CMP Latest Ref Rng & Units 08/31/2016 08/10/2016 07/20/2016  Glucose 70 - 140 mg/dl 91 107 107  BUN 7.0 - 26.0 mg/dL 8.0 9.9 9.5  Creatinine 0.6 - 1.1 mg/dL 0.8 0.8 0.7  Sodium 136 - 145 mEq/L 141 141 141  Potassium 3.5 - 5.1 mEq/L 3.7 3.3(L) 3.3(L)  Chloride 101 - 111 mmol/L - - -  CO2 22 - 29 mEq/L _0 Calcium 8.4 - 10.4 mg/dL 9.7 9.4 9.5  Total Protein 6.4 - 8.3 g/dL 8.0 7.6 7.6  Total Bilirubin 0.20 - 1.20 mg/dL 1.04 0.85 0.84  Alkaline Phos 40 - 150 U/L 78 73 65  AST 5 - 34 U/L _1 ALT 0 - 55 U/L _2 Pathology report Diagnosis 11/25/2015 1. Breast, simple mastectomy, right - INVASIVE DUCTAL CARCINOMA, GRADE 2/3, SPANNING 2.0 CM. - DUCTAL CARCINOMA IN SITU, INTERMEDIATE GRADE. - LYMPHOVASCULAR INVASION IS IDENTIFIED. - LOBULAR NEOPLASIA (ATYPICAL LOBULAR HYPERPLASIA). - THE SURGICAL RESECTION MARGINS ARE NEGATIVE FOR CARCINOMA. - SEE ONCOLOGY TABLE BELOW. 2. Lymph node, sentinel, biopsy, right axillary #1 - METASTATIC CARCINOMA IN 1 OF 1 LYMPH NODE (1/1). 3. Lymph node, sentinel, biopsy, right axillary #2 - THERE IS NO EVIDENCE OF CARCINOMA IN 1 OF 1 LYMPH NODE (0/1). 4. Lymph node, sentinel, biopsy, right axillary #3 - THERE IS NO EVIDENCE OF CARCINOMA IN 1 OF 1 LYMPH NODE (0/1). 5. Breast, simple mastectomy, left - BENIGN  BREAST PARENCHYMA WITH DENSE STROMAL FIBROSIS. - FIBROADENOMA. - THERE IS NO EVIDENCE OF MALIGNANCY. 6. Breast, excision, left, additional lateral margin - BENIGN FIBROADIPOSE TISSUE. - THERE IS NO EVIDENCE OF MALIGNANCY. - SEE COMMENT. Microscopic Comment 1. BREAST, INVASIVE TUMOR, WITH LYMPH NODES PRESENT Specimen, including laterality and lymph node sampling (sentinel, non-sentinel): Right breast and right axillary lymph nodes Procedure: Bilateral mastectomy and multiple right axillary lymph node resections Histologic type: Ductal Grade: 2 Tubule formation: 2 1 of 4 FINAL for [place], [place] ([id number]) Microscopic Comment(continued) Nuclear pleomorphism: 2 Mitotic: 2 Tumor size (gross measurement): 2.0 cm Margins: Negative for carcinoma Invasive, distance to closest margin: 1.8 cm to the posterior margin In-situ, distance to closest margin: 1.8 cm to the posterior margin Lymphovascular invasion: Present Ductal carcinoma in situ: Present Grade: Intermediate grade Extensive intraductal component: Not identified Lobular neoplasia: Present, atypical lobular hyperplasia Tumor focality: Unifocal Treatment effect: Not identified Extent of tumor: Confined to breast parenchyma Lymph nodes: Examined: 3 Sentinel 0 Non-sentinel 3 Total Lymph nodes with metastasis: 1 (macrometastasis) - no extracapsular extension is identified. Breast prognostic profile: [phone] Estrogen receptor: 100%, strong staining intensity Progesterone receptor: 100%, strong staining intensity Her 2 neu: Amplification was detected. The ratio was 2.58 Ki-67: 60% TNM: pT1c, pN1a Non-neoplastic breast: No significant findings. 6. The surgical resection margin(s) of the specimen were inked and microscopically evaluated. Enid Cutter MD Pathologist, Electronic Signature (Case signed 11/29/2015)   RADIOGRAPHIC STUDIES: I have personally reviewed the radiological images as listed and agreed with the  findings in the report.  Bone scan 12/28/2014 IMPRESSION: Foci of increased tracer localization at the sternum, unable to exclude metastases.  Uptake at inferior cervical spine could potentially be related to  degenerative disc disease changes present at C5-C6 on CT.  Questionable abnormal increased tracer localization at the posterior LEFT ninth and tenth ribs.  CT chest, abdomen and pelvis 12/28/2014 IMPRESSION: 1. 2 cm enhancing lesion in segment 5 of the liver is indeterminate. It could reflect FNH, vascular shunt and less likely metastasis. Recommend MRI abdomen without and with contrast using Eovist for further evaluation. No other hepatic lesions. 2. No findings for metastatic disease involving the chest. There is a focal area of airspace disease in the superior segment of the right lower lobe which could be a developing or resolving infiltrate. Recommend correlation with clinical symptoms.  ECHO 08/02/2016 Impressions:  - Normal LV size with EF 55%. Strain as noted above. Normal RV size   and systolic function.  ASSESSMENT & PLAN:  50 year old African-American female, surgical postmenopausal, history of stage I left breast cancer, with newly diagnosed right breast cancer.  1. Right breast invasive ductal carcinoma, pT1cN1aM0, stage IIB, grade 2, ER100%+/PR100%+/HER2+ -I previously reviewed her surgical pathology results with patient in great details. She has 1 out of 3 sentinel lymph nodes positive, her pathological stage is more advanced than her initial clinical stage.  -We reviewed the natural history of triple positive breast cancer. HER-2 positive tumors are more progressive, with higher risk of recurrence -I reviewed her restaging CT scan and bone scan images with her in person. I discussed the bone scan findings with radiologist Dr. Thornton Papas. The mild hypermetabolic uptake in the sternum and left ninth and 10th rib have no significant corresponding change on the CT  scan, except for small lytic spot in the sternum. They're not amenable for biopsy, PET scan may not have much additional value, the suspicion for metastases from breast cancer is low. I recommend follow-up. The 2 cm lesion in the liver are likely benign, unfortunately she is not able to do liver MRI, due to her breast implants.  -She is still concerned about the above scan findings. I recommend to have a repeated CT or PET scan a few months after she completes adjuvant chemotherapy for follow-up. -Given her high risk of cancer recurrence, I recommend adjuvant chemotherapy with docetaxel, carboplatin, Herceptin and Perjeta (TCHP), every 3 weeks for 6 cycles,  followed by maintenance Herceptin to complete a 1 year therapy. She previously received Adriamycin, will not be able to tolerate more adrimycin. Herceptin and pejeta was held for the last 2 cycle chemotherapy, due to her decreased EF. -Her repeated echo has showed normal EF, she has restarted Herceptin maintenance therapy. -She  has now completed adjuvant breast and axilla radiation and tolerated well. -She previously could not tolerate letrozole. She has started exemestane a few weeks ago, overall tolerates well, we'll continue. -Lab results reviewed with her, we'll continue Herceptin maintenance therapy  2. Genetics -Due to her young age and recurrent breast cancer, she was referred to see a genetic counselor in our cancer center. -Her genetic test was negative  3. DM -she will continue follow-up with her primary care physician -She is on metformin  4. Bone health -She is postmenopausal by surgery -I encouraged her to take calcium and vitamin D -I'll obtain a baseline bone density scan in the next few months   6. Insomnia and low appetite  -continue mirtazapine -she has not been able to wean off ambien, will continue for now   7. Headaches -Secondary to her cervical spine degenerative arthritis -Improved after steroids  injection   Plan for today  -Lab reviewed, continue Herceptin. -  Continue exemestane -bone density scan in the next month  -I will see her back in 9 weeks, will repeat bone scan and CT abdomen before next visit   All questions were answered. The patient knows to call the clinic with any problems, questions or concerns.  I spent 25 minutes counseling the patient face to face. The total time spent in the appointment was 30 minutes and more than 50% was on counseling.     Truitt Merle, MD 08/31/2016

## 2016-09-03 ENCOUNTER — Encounter: Payer: Self-pay | Admitting: Hematology

## 2016-09-18 MED FILL — ZOLPIDEM TARTRATE 10 MG TAB: 10 | 30 days supply | Qty: 30 | Fill #1

## 2016-09-20 ENCOUNTER — Other Ambulatory Visit: Payer: Self-pay | Admitting: *Deleted

## 2016-09-21 ENCOUNTER — Ambulatory Visit (HOSPITAL_BASED_OUTPATIENT_CLINIC_OR_DEPARTMENT_OTHER): Payer: BLUE CROSS/BLUE SHIELD

## 2016-09-21 VITALS — BP 106/69 | HR 86 | Temp 98.7°F | Resp 18

## 2016-09-21 DIAGNOSIS — C50411 Malignant neoplasm of upper-outer quadrant of right female breast: Secondary | ICD-10-CM

## 2016-09-21 DIAGNOSIS — Z853 Personal history of malignant neoplasm of breast: Secondary | ICD-10-CM

## 2016-09-21 DIAGNOSIS — Z5112 Encounter for antineoplastic immunotherapy: Secondary | ICD-10-CM | POA: Diagnosis not present

## 2016-09-21 LAB — COMPREHENSIVE METABOLIC PANEL
ALT: 11 U/L (ref 0–55)
ANION GAP: 10 meq/L (ref 3–11)
AST: 13 U/L (ref 5–34)
Albumin: 3.8 g/dL (ref 3.5–5.0)
Alkaline Phosphatase: 69 U/L (ref 40–150)
BUN: 8.6 mg/dL (ref 7.0–26.0)
CALCIUM: 9.3 mg/dL (ref 8.4–10.4)
CHLORIDE: 105 meq/L (ref 98–109)
CO2: 24 mEq/L (ref 22–29)
CREATININE: 0.7 mg/dL (ref 0.6–1.1)
Glucose: 101 mg/dl (ref 70–140)
POTASSIUM: 3.6 meq/L (ref 3.5–5.1)
Sodium: 140 mEq/L (ref 136–145)
Total Bilirubin: 0.78 mg/dL (ref 0.20–1.20)
Total Protein: 7.4 g/dL (ref 6.4–8.3)

## 2016-09-21 LAB — CBC WITH DIFFERENTIAL/PLATELET
BASO%: 0.2 % (ref 0.0–2.0)
BASOS ABS: 0 10*3/uL (ref 0.0–0.1)
EOS ABS: 0.1 10*3/uL (ref 0.0–0.5)
EOS%: 1 % (ref 0.0–7.0)
HEMATOCRIT: 33.8 % — AB (ref 34.8–46.6)
HGB: 11.4 g/dL — ABNORMAL LOW (ref 11.6–15.9)
LYMPH#: 0.9 10*3/uL (ref 0.9–3.3)
LYMPH%: 19 % (ref 14.0–49.7)
MCH: 30.6 pg (ref 25.1–34.0)
MCHC: 33.7 g/dL (ref 31.5–36.0)
MCV: 90.6 fL (ref 79.5–101.0)
MONO#: 0.3 10*3/uL (ref 0.1–0.9)
MONO%: 6.5 % (ref 0.0–14.0)
NEUT#: 3.5 10*3/uL (ref 1.5–6.5)
NEUT%: 73.3 % (ref 38.4–76.8)
PLATELETS: 237 10*3/uL (ref 145–400)
RBC: 3.73 10*6/uL (ref 3.70–5.45)
RDW: 14.1 % (ref 11.2–14.5)
WBC: 4.8 10*3/uL (ref 3.9–10.3)

## 2016-09-21 MED ORDER — SODIUM CHLORIDE 0.9% FLUSH
10.0000 mL | INTRAVENOUS | Status: DC | PRN
  Administered 2016-09-21: 10 mL
  Filled 2016-09-21: qty 10

## 2016-09-21 MED ORDER — ACETAMINOPHEN 325 MG PO TABS
650.0000 mg | ORAL_TABLET | Freq: Once | ORAL | Status: AC
  Administered 2016-09-21: 650 mg via ORAL

## 2016-09-21 MED ORDER — DIPHENHYDRAMINE HCL 25 MG PO CAPS
50.0000 mg | ORAL_CAPSULE | Freq: Once | ORAL | Status: AC
  Administered 2016-09-21: 50 mg via ORAL

## 2016-09-21 MED ORDER — SODIUM CHLORIDE 0.9 % IV SOLN
Freq: Once | INTRAVENOUS | Status: AC
  Administered 2016-09-21: 11:00:00 via INTRAVENOUS

## 2016-09-21 MED ORDER — ACETAMINOPHEN 325 MG PO TABS
ORAL_TABLET | ORAL | Status: AC
  Filled 2016-09-21: qty 2

## 2016-09-21 MED ORDER — DIPHENHYDRAMINE HCL 25 MG PO CAPS
ORAL_CAPSULE | ORAL | Status: AC
  Filled 2016-09-21: qty 2

## 2016-09-21 MED ORDER — HEPARIN SOD (PORK) LOCK FLUSH 100 UNIT/ML IV SOLN
500.0000 [IU] | Freq: Once | INTRAVENOUS | Status: AC | PRN
  Administered 2016-09-21: 500 [IU]
  Filled 2016-09-21: qty 5

## 2016-09-21 MED ORDER — TRASTUZUMAB CHEMO 150 MG IV SOLR
6.0000 mg/kg | Freq: Once | INTRAVENOUS | Status: AC
  Administered 2016-09-21: 378 mg via INTRAVENOUS
  Filled 2016-09-21: qty 18

## 2016-09-21 NOTE — Patient Instructions (Signed)
[facility] Discharge Instructions for Patients Receiving Chemotherapy  Today you received the following chemotherapy agents herceptin. To help prevent nausea and vomiting after your treatment, we encourage you to take your nausea medication as directed.  If you develop nausea and vomiting that is not controlled by your nausea medication, call the clinic.   BELOW ARE SYMPTOMS THAT SHOULD BE REPORTED IMMEDIATELY:  *FEVER GREATER THAN 100.5 F  *CHILLS WITH OR WITHOUT FEVER  NAUSEA AND VOMITING THAT IS NOT CONTROLLED WITH YOUR NAUSEA MEDICATION  *UNUSUAL SHORTNESS OF BREATH  *UNUSUAL BRUISING OR BLEEDING  TENDERNESS IN MOUTH AND THROAT WITH OR WITHOUT PRESENCE OF ULCERS  *URINARY PROBLEMS  *BOWEL PROBLEMS  UNUSUAL RASH Items with * indicate a potential emergency and should be followed up as soon as possible.  Feel free to call the clinic you have any questions or concerns. The clinic phone number is [phone].  Please show the CHEMO ALERT CARD at check-in to the Emergency Department and triage nurse.    

## 2016-09-21 NOTE — Progress Notes (Signed)
Patient arrived stating that "I feel draggy. I went on vacation last week and didn't get anything done like I wanted to. My appetite is not normal either." Patient requested that her labs be drawn. Writer spoke with Dr. Burr Medico and orders received for CBC and CMET.

## 2016-09-26 ENCOUNTER — Encounter: Payer: Self-pay | Admitting: Nurse Practitioner

## 2016-09-26 ENCOUNTER — Ambulatory Visit (HOSPITAL_BASED_OUTPATIENT_CLINIC_OR_DEPARTMENT_OTHER): Payer: BLUE CROSS/BLUE SHIELD | Admitting: Nurse Practitioner

## 2016-09-26 ENCOUNTER — Other Ambulatory Visit: Payer: Self-pay | Admitting: Nurse Practitioner

## 2016-09-26 VITALS — BP 105/70 | HR 65 | Temp 98.4°F | Resp 16 | Ht 66.0 in | Wt 141.1 lb

## 2016-09-26 DIAGNOSIS — C50411 Malignant neoplasm of upper-outer quadrant of right female breast: Secondary | ICD-10-CM

## 2016-09-26 DIAGNOSIS — R51 Headache: Secondary | ICD-10-CM

## 2016-09-26 DIAGNOSIS — Z170 Estrogen receptor positive status [ER+]: Secondary | ICD-10-CM

## 2016-09-26 DIAGNOSIS — G8929 Other chronic pain: Secondary | ICD-10-CM

## 2016-09-26 DIAGNOSIS — R519 Headache, unspecified: Secondary | ICD-10-CM

## 2016-09-26 MED ORDER — TRAMADOL HCL 50 MG PO TABS: 50.0000 mg | ORAL_TABLET | Freq: Four times a day (QID) | ORAL | 0 refills | Status: DC | PRN

## 2016-09-26 NOTE — Progress Notes (Signed)
SYMPTOM MANAGEMENT CLINIC    Chief Complaint: Chronic headaches  HPI:  Sandra James 50 y.o. female diagnosed with breast cancer.  Currently undergoing Herceptin infusions and Aromasin oral therapy.   Oncology History   Breast cancer [facility])   Staging form: Breast, AJCC 7th Edition     Pathologic stage from 11/25/2015: Stage IIA (T1c, N1a, cM0) - Unsigned     Pathologic stage from 11/25/2015: Stage IIA (T1c, N1a, cM0) - Unsigned Breast cancer of upper-outer quadrant of right female breast [facility])   Staging form: Breast, AJCC 7th Edition     Clinical stage from 09/15/2015: Stage IA (T1b, N0, M0) - Signed by Truitt Merle, MD on 12/16/2015     Pathologic stage from 11/25/2015: Stage IIA (T1c, N1a, cM0) - Signed by Truitt Merle, MD on 12/16/2015         Breast cancer of upper-outer quadrant of right female breast ([place])   09/07/2015 Mammogram    Diagnostic mammogram and the ultrasound of the right breast showed a 0.8 cm lobulated mass in the right breast 11 to 12:00 position 9 cm from the nipple. No other lesions or adenopathy.      09/08/2015 Initial Diagnosis    Breast cancer of upper-outer quadrant of right female breast ([place])      09/08/2015 Initial Biopsy    Right breast mass at the upper outer quadrant core needle biopsy showed invasive ductal carcinoma, grade 2-3, ductal carcinoma in situ.      09/08/2015 Receptors her2    ER 100% positive, PR 100% positive, Ki-67 60%, HER-2 positive with copy # 4.25 and ratio 2.58      11/25/2015 Surgery    Right breast mastectomy and sentinel lymph node biopsy      11/25/2015 Pathology Results    Right breast invasive ductal carcinoma, grade 2, 2.0 cm, DCIS, intermediate grade, (+) LVI, margins were negative, 1 out of 3 lymph nodes were negative. Left breast ostectomy was negative for malignancy.      01/06/2016 - 04/20/2016 Chemotherapy    adjuvant chemotherapy TCH P, every 3 weeks, for a total of 6 cycles. Herceptin and Perjeta were held for last  two cycles due to drop of her EF on echo       05/25/2016 - 07/11/2016 Radiation Therapy    Adjuvant breast radiation      06/08/2016 -  Chemotherapy    She started maintenance Herceptin every 3 weeks, after clearance from cardiology.      08/11/2016 -  Anti-estrogen oral therapy    Exemestane 25 mg daily       Review of Systems  Musculoskeletal: Positive for neck pain.  Neurological: Positive for headaches.  All other systems reviewed and are negative.   Past Medical History:  Diagnosis Date  . Allergy   . Anxiety   . Breast cancer ([place]) 09/07/15   right breast  . Breast cancer of upper-outer quadrant of right female breast ([place]) 09/09/2015  . Cancer [facility])    left brast lumpectomy   . Complication of anesthesia    slow to wake up- BP was low, fainted next day-  . Depression   . Diabetes mellitus without complication ([place])   . Fibroid   . GERD (gastroesophageal reflux disease)   . Infection    UTI  . MVP (mitral valve prolapse)   . Neuromuscular disorder ([place])    sciatic nerve paion left side/leg  . UTI (lower urinary tract infection)     Past Surgical History:  Procedure Laterality Date  . ABDOMINAL HYSTERECTOMY    . BREAST RECONSTRUCTION WITH PLACEMENT OF TISSUE EXPANDER AND FLEX HD (ACELLULAR HYDRATED DERMIS) Bilateral 11/25/2015   Procedure: BILATERAL IMMEDIATE BREAST RECONSTRUCTION WITH PLACEMENT OF TISSUE EXPANDERS AND FLEX HD (ACELLULAR HYDRATED DERMIS);  Surgeon: Wallace Going, DO;  Location: [place];  Service: Plastics;  Laterality: Bilateral;  . BREAST SURGERY  2004   left lumpectomy  . MASTECTOMY W/ SENTINEL NODE BIOPSY Bilateral 11/25/2015   Procedure: BILATERAL SKIN SPARING MASTECTOMIES WITH RIGHT SENTINEL LYMPH NODE BIOPSY;  Surgeon: Stark Klein, MD;  Location: [place];  Service: General;  Laterality: Bilateral;  . OOPHORECTOMY Bilateral   . 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    is allergic to lisinopril; oxycodone; and percocet [oxycodone-acetaminophen].    Medication List       Accurate as of 09/26/16  4:58 PM. Always use your most recent med list.          bisoprolol 5 MG tablet Commonly known as:  ZEBETA Take 0.5 tablets (2.5 mg total) by mouth at bedtime.   exemestane 25 MG tablet Commonly known as:  AROMASIN Take 1 tablet (25 mg total) by mouth daily after breakfast.   fluticasone 50 MCG/ACT nasal spray Commonly known as:  FLONASE Place 1 spray into both nostrils daily as needed for allergies.   gabapentin 100 MG capsule Commonly known as:  NEURONTIN Take 1-2 capsules (100-200 mg total) by mouth at bedtime.   Geritol Liqd Take 30 mLs by mouth daily. Energy support   ibuprofen 800 MG tablet Commonly known as:  ADVIL,MOTRIN Take 800 mg by mouth daily as needed for headache or moderate pain.   losartan 25 MG tablet Commonly known as:  COZAAR Take 0.5 tablets (12.5 mg total) by mouth daily.   traMADol 50 MG tablet Commonly known as:  ULTRAM Take 1 tablet (50 mg total) by mouth every 6 (six) hours as needed.   zolpidem 10 MG tablet Commonly known as:  AMBIEN Take 1 tablet (10 mg total) by mouth at bedtime as needed for sleep.        PHYSICAL EXAMINATION  Oncology Vitals 09/26/2016 09/21/2016  Height 168 cm -  Weight 64.003 kg -  Weight (lbs) 141 lbs 2 oz -  BMI (kg/m2) 22.77 kg/m2 -  Temp 98.4 98.7  Pulse 65 86  Resp 16 18  SpO2 100 100  BSA (m2) 1.73 m2 -   BP Readings from Last 2  Encounters:  09/26/16 105/70  09/21/16 106/69    Physical Exam  Constitutional: She is oriented to person, place, and time and well-developed, well-nourished, and in no distress.  HENT:  Head: Normocephalic and atraumatic.  Mouth/Throat: Oropharynx is clear and moist.  Eyes: Conjunctivae and EOM are normal. Pupils are equal, round, and reactive to light. Right eye exhibits no discharge. Left eye exhibits no discharge. No scleral icterus.  Neck: Normal range of motion. Neck supple. No JVD present. No tracheal deviation present. No thyromegaly present.  Cardiovascular: Normal rate, regular rhythm, normal heart sounds  and intact distal pulses.   Pulmonary/Chest: Effort normal and breath sounds normal. No respiratory distress. She has no wheezes. She has no rales. She exhibits no tenderness.  Abdominal: Soft. Bowel sounds are normal. She exhibits no distension and no mass. There is no tenderness. There is no rebound and no guarding.  Musculoskeletal: Normal range of motion. She exhibits no edema or tenderness.  Lymphadenopathy:    She has no cervical adenopathy.  Neurological: She is alert and oriented to person, place, and time. Gait normal.  Skin: Skin is warm and dry. No rash noted. No erythema. No pallor.  Psychiatric: Affect normal.  Nursing note and vitals reviewed.   LABORATORY DATA:. No visits with results within 3 Day(s) from this visit.  Latest known visit with results is:  Appointment on 09/21/2016  Component Date Value Ref Range Status  . WBC 09/21/2016 4.8  3.9 - 10.3 10e3/uL Final  . NEUT# 09/21/2016 3.5  1.5 - 6.5 10e3/uL Final  . HGB 09/21/2016 11.4* 11.6 - 15.9 g/dL Final  . HCT 09/21/2016 33.8* 34.8 - 46.6 % Final  . Platelets 09/21/2016 237  145 - 400 10e3/uL Final  . MCV 09/21/2016 90.6  79.5 - 101.0 fL Final  . MCH 09/21/2016 30.6  25.1 - 34.0 pg Final  . MCHC 09/21/2016 33.7  31.5 - 36.0 g/dL Final  . RBC 09/21/2016 3.73  3.70 - 5.45 10e6/uL Final  . RDW  09/21/2016 14.1  11.2 - 14.5 % Final  . lymph# 09/21/2016 0.9  0.9 - 3.3 10e3/uL Final  . MONO# 09/21/2016 0.3  0.1 - 0.9 10e3/uL Final  . Eosinophils Absolute 09/21/2016 0.1  0.0 - 0.5 10e3/uL Final  . Basophils Absolute 09/21/2016 0.0  0.0 - 0.1 10e3/uL Final  . NEUT% 09/21/2016 73.3  38.4 - 76.8 % Final  . LYMPH% 09/21/2016 19.0  14.0 - 49.7 % Final  . MONO% 09/21/2016 6.5  0.0 - 14.0 % Final  . EOS% 09/21/2016 1.0  0.0 - 7.0 % Final  . BASO% 09/21/2016 0.2  0.0 - 2.0 % Final  . Sodium 09/21/2016 140  136 - 145 mEq/L Final  . Potassium 09/21/2016 3.6  3.5 - 5.1 mEq/L Final  . Chloride 09/21/2016 105  98 - 109 mEq/L Final  . CO2 09/21/2016 24  22 - 29 mEq/L Final  . Glucose 09/21/2016 101  70 - 140 mg/dl Final  . BUN 09/21/2016 8.6  7.0 - 26.0 mg/dL Final  . Creatinine 09/21/2016 0.7  0.6 - 1.1 mg/dL Final  . Total Bilirubin 09/21/2016 0.78  0.20 - 1.20 mg/dL Final  . Alkaline Phosphatase 09/21/2016 69  40 - 150 U/L Final  . AST 09/21/2016 13  5 - 34 U/L Final  . ALT 09/21/2016 11  0 - 55 U/L Final  . Total Protein 09/21/2016 7.4  6.4 - 8.3 g/dL Final  . Albumin 09/21/2016 3.8  3.5 - 5.0 g/dL Final  . Calcium 09/21/2016 9.3  8.4 - 10.4 mg/dL Final  . Anion Gap 09/21/2016 10  3 - 11 mEq/L Final  . EGFR 09/21/2016 >90  >90 ml/min/1.73 m2 Final    RADIOGRAPHIC STUDIES: No results found.  ASSESSMENT/PLAN:    Chronic headaches Patient has had a history of chronic, intermittent headaches for several months to years.  She states that she has been told in the past that she has chronic cervical degenerative disc disease; and this is been causing her headaches.  She states that she has been to the emergency department  in the past and urgent cares; and most recently received a steroid injection in her buttock for treatment of the headache.  She was also given a prescription for one week of prednisone.  When asked-patient stated that this greatly relieved her headache issues.  She denies any  neurological changes.  She denies any vision changes.  She denies any nausea or vomiting.  She denies any dizziness.  She denies any weakness, numbness, or tingling.  She states she is still able to manage all of her daily activities; and is in a hurry to go to her part-time job this evening.  Exam today reveals the patient is intact.  Neurologically; and in no acute distress whatsoever.  Reviewed all with Dr. Burr Medico, and she suggested that patient undergo a brain MRI with and without contrast within the next week to rule out any brain metastasis.  Also, will order a orthopedic referral to [place] for further evaluation of her chronic cervical issues.  Patient states that she has taken tramadol in the past; and so will be prescribed a refill of the tramadol for any increased headache issues.  Patient was advised to call/return or go directly to the emergency department for any worsening symptoms whatsoever.  Breast cancer of upper-outer quadrant of right female breast [facility] 2) Patient received cycle 6 of her Herceptin only.  Infusion on 09/21/2016.  She also continues to take Aromasin oral therapy as directed.  Patient is scheduled to return on 10/12/2016 for labs, flush, and Herceptin.   Patient stated understanding of all instructions; and was in agreement with this plan of care. The patient knows to call the clinic with any problems, questions or concerns.   Total time spent with patient was 25 minutes;  with greater than 75 percent of that time spent in face to face counseling regarding patient's symptoms,  and coordination of care and follow up.  Disclaimer:This dictation was prepared with Dragon/digital dictation along with [employer]. Any transcriptional errors that result from this process are unintentional.  Drue Second, NP 09/26/2016

## 2016-09-26 NOTE — Assessment & Plan Note (Signed)
Patient received cycle 6 of her Herceptin only.  Infusion on 09/21/2016.  She also continues to take Aromasin oral therapy as directed.  Patient is scheduled to return on 10/12/2016 for labs, flush, and Herceptin.

## 2016-09-26 NOTE — Assessment & Plan Note (Signed)
Patient has had a history of chronic, intermittent headaches for several months to years.  She states that she has been told in the past that she has chronic cervical degenerative disc disease; and this is been causing her headaches.  She states that she has been to the emergency department in the past and urgent cares; and most recently received a steroid injection in her buttock for treatment of the headache.  She was also given a prescription for one week of prednisone.  When asked-patient stated that this greatly relieved her headache issues.  She denies any neurological changes.  She denies any vision changes.  She denies any nausea or vomiting.  She denies any dizziness.  She denies any weakness, numbness, or tingling.  She states she is still able to manage all of her daily activities; and is in a hurry to go to her part-time job this evening.  Exam today reveals the patient is intact.  Neurologically; and in no acute distress whatsoever.  Reviewed all with Dr. Burr Medico, and she suggested that patient undergo a brain MRI with and without contrast within the next week to rule out any brain metastasis.  Also, will order a orthopedic referral to [facility] for further evaluation of her chronic cervical issues.  Patient states that she has taken tramadol in the past; and so will be prescribed a refill of the tramadol for any increased headache issues.  Patient was advised to call/return or go directly to the emergency department for any worsening symptoms whatsoever.

## 2016-09-27 MED FILL — traMADol HCL 50 MG TABS: 50 | 13 days supply | Qty: 50 | Fill #0

## 2016-09-29 ENCOUNTER — Other Ambulatory Visit: Payer: Self-pay | Admitting: *Deleted

## 2016-09-29 ENCOUNTER — Other Ambulatory Visit: Payer: Self-pay | Admitting: Nurse Practitioner

## 2016-09-29 DIAGNOSIS — C50411 Malignant neoplasm of upper-outer quadrant of right female breast: Secondary | ICD-10-CM

## 2016-10-03 ENCOUNTER — Ambulatory Visit (HOSPITAL_COMMUNITY): Admission: RE | Admit: 2016-10-03 | Payer: BLUE CROSS/BLUE SHIELD | Source: Ambulatory Visit

## 2016-10-12 ENCOUNTER — Encounter: Payer: Self-pay | Admitting: Hematology

## 2016-10-12 ENCOUNTER — Ambulatory Visit (HOSPITAL_BASED_OUTPATIENT_CLINIC_OR_DEPARTMENT_OTHER): Payer: BLUE CROSS/BLUE SHIELD | Admitting: Hematology

## 2016-10-12 ENCOUNTER — Other Ambulatory Visit (HOSPITAL_BASED_OUTPATIENT_CLINIC_OR_DEPARTMENT_OTHER): Payer: BLUE CROSS/BLUE SHIELD

## 2016-10-12 ENCOUNTER — Encounter: Payer: Self-pay | Admitting: *Deleted

## 2016-10-12 ENCOUNTER — Ambulatory Visit (HOSPITAL_BASED_OUTPATIENT_CLINIC_OR_DEPARTMENT_OTHER): Payer: BLUE CROSS/BLUE SHIELD

## 2016-10-12 VITALS — BP 95/65 | HR 98 | Temp 98.4°F | Resp 18

## 2016-10-12 DIAGNOSIS — E119 Type 2 diabetes mellitus without complications: Secondary | ICD-10-CM | POA: Diagnosis not present

## 2016-10-12 DIAGNOSIS — R51 Headache: Secondary | ICD-10-CM | POA: Diagnosis not present

## 2016-10-12 DIAGNOSIS — C50411 Malignant neoplasm of upper-outer quadrant of right female breast: Secondary | ICD-10-CM

## 2016-10-12 DIAGNOSIS — Z853 Personal history of malignant neoplasm of breast: Secondary | ICD-10-CM

## 2016-10-12 DIAGNOSIS — Z5112 Encounter for antineoplastic immunotherapy: Secondary | ICD-10-CM

## 2016-10-12 DIAGNOSIS — Z170 Estrogen receptor positive status [ER+]: Secondary | ICD-10-CM

## 2016-10-12 LAB — CBC WITH DIFFERENTIAL/PLATELET
BASO%: 0.9 % (ref 0.0–2.0)
BASOS ABS: 0 10*3/uL (ref 0.0–0.1)
EOS%: 1.5 % (ref 0.0–7.0)
Eosinophils Absolute: 0.1 10*3/uL (ref 0.0–0.5)
HEMATOCRIT: 37.7 % (ref 34.8–46.6)
HEMOGLOBIN: 12.6 g/dL (ref 11.6–15.9)
LYMPH#: 1.1 10*3/uL (ref 0.9–3.3)
LYMPH%: 24.8 % (ref 14.0–49.7)
MCH: 30.8 pg (ref 25.1–34.0)
MCHC: 33.4 g/dL (ref 31.5–36.0)
MCV: 92.2 fL (ref 79.5–101.0)
MONO#: 0.3 10*3/uL (ref 0.1–0.9)
MONO%: 7.4 % (ref 0.0–14.0)
NEUT#: 3 10*3/uL (ref 1.5–6.5)
NEUT%: 65.4 % (ref 38.4–76.8)
PLATELETS: 331 10*3/uL (ref 145–400)
RBC: 4.09 10*6/uL (ref 3.70–5.45)
RDW: 14.3 % (ref 11.2–14.5)
WBC: 4.6 10*3/uL (ref 3.9–10.3)

## 2016-10-12 LAB — COMPREHENSIVE METABOLIC PANEL
ALBUMIN: 3.9 g/dL (ref 3.5–5.0)
ALK PHOS: 83 U/L (ref 40–150)
ALT: 19 U/L (ref 0–55)
AST: 18 U/L (ref 5–34)
Anion Gap: 11 mEq/L (ref 3–11)
BILIRUBIN TOTAL: 0.35 mg/dL (ref 0.20–1.20)
BUN: 8.5 mg/dL (ref 7.0–26.0)
CALCIUM: 9.5 mg/dL (ref 8.4–10.4)
CO2: 25 mEq/L (ref 22–29)
CREATININE: 0.7 mg/dL (ref 0.6–1.1)
Chloride: 107 mEq/L (ref 98–109)
EGFR: >90 mL/min/{1.73_m2} (ref >90)
Glucose: 101 mg/dl (ref 70–140)
Potassium: 3.5 mEq/L (ref 3.5–5.1)
Sodium: 143 mEq/L (ref 136–145)
Total Protein: 7.9 g/dL (ref 6.4–8.3)

## 2016-10-12 MED ORDER — DIPHENHYDRAMINE HCL 25 MG PO CAPS
ORAL_CAPSULE | ORAL | Status: AC
  Filled 2016-10-12: qty 2

## 2016-10-12 MED ORDER — ACETAMINOPHEN 325 MG PO TABS
ORAL_TABLET | ORAL | Status: AC
  Filled 2016-10-12: qty 2

## 2016-10-12 MED ORDER — SODIUM CHLORIDE 0.9% FLUSH
10.0000 mL | INTRAVENOUS | Status: DC | PRN
  Administered 2016-10-12: 10 mL
  Filled 2016-10-12: qty 10

## 2016-10-12 MED ORDER — HEPARIN SOD (PORK) LOCK FLUSH 100 UNIT/ML IV SOLN
500.0000 [IU] | Freq: Once | INTRAVENOUS | Status: AC | PRN
  Administered 2016-10-12: 500 [IU]
  Filled 2016-10-12: qty 5

## 2016-10-12 MED ORDER — VENLAFAXINE HCL ER 37.5 MG PO CP24: 37.5000 mg | ORAL_CAPSULE | Freq: Every day | ORAL | 0 refills | Status: DC

## 2016-10-12 MED ORDER — SODIUM CHLORIDE 0.9 % IV SOLN
Freq: Once | INTRAVENOUS | Status: AC
  Administered 2016-10-12: 10:00:00 via INTRAVENOUS

## 2016-10-12 MED ORDER — TRASTUZUMAB CHEMO 150 MG IV SOLR
6.0000 mg/kg | Freq: Once | INTRAVENOUS | Status: AC
  Administered 2016-10-12: 378 mg via INTRAVENOUS
  Filled 2016-10-12: qty 18

## 2016-10-12 MED ORDER — ACETAMINOPHEN 325 MG PO TABS
650.0000 mg | ORAL_TABLET | Freq: Once | ORAL | Status: AC
  Administered 2016-10-12: 650 mg via ORAL

## 2016-10-12 MED ORDER — DIPHENHYDRAMINE HCL 25 MG PO CAPS
50.0000 mg | ORAL_CAPSULE | Freq: Once | ORAL | Status: AC
  Administered 2016-10-12: 50 mg via ORAL

## 2016-10-12 MED FILL — VENLAFAXINE HCL ER 37.5 MG: 37.5 | 29 days supply | Qty: 45 | Fill #0

## 2016-10-12 NOTE — Progress Notes (Signed)
Sandra James  [phone] Fax:([phone]  Clinic follow Up Note   Patient Care Team: Kristie Cowman, MD as PCP - General (Family Medicine) Stark Klein, MD as Consulting Physician (General Surgery) Truitt Merle, MD as Consulting Physician (Hematology) Arloa Koh, MD as Consulting Physician (Radiation Oncology) Mauro Kaufmann, RN as Registered Nurse Rockwell Germany, RN as Registered Nurse Sylvan Cheese, NP as Nurse Practitioner (Nurse Practitioner) 10/12/2016  CHIEF COMPLAINTS:  Follow up right breast cancer  Oncology History   Breast cancer [facility])   Staging form: Breast, AJCC 7th Edition     Pathologic stage from 11/25/2015: Stage IIA (T1c, N1a, cM0) - Unsigned     Pathologic stage from 11/25/2015: Stage IIA (T1c, N1a, cM0) - Unsigned Breast cancer of upper-outer quadrant of right female breast [facility])   Staging form: Breast, AJCC 7th Edition     Clinical stage from 09/15/2015: Stage IA (T1b, N0, M0) - Signed by Truitt Merle, MD on 12/16/2015     Pathologic stage from 11/25/2015: Stage IIA (T1c, N1a, cM0) - Signed by Truitt Merle, MD on 12/16/2015         Breast cancer of upper-outer quadrant of right female breast ([place])   09/07/2015 Mammogram    Diagnostic mammogram and the ultrasound of the right breast showed a 0.8 cm lobulated mass in the right breast 11 to 12:00 position 9 cm from the nipple. No other lesions or adenopathy.      09/08/2015 Initial Diagnosis    Breast cancer of upper-outer quadrant of right female breast ([place])      09/08/2015 Initial Biopsy    Right breast mass at the upper outer quadrant core needle biopsy showed invasive ductal carcinoma, grade 2-3, ductal carcinoma in situ.      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 Left breast ostectomy was negative for malignancy.      01/06/2016 - 04/20/2016 Chemotherapy    adjuvant chemotherapy TCH P, every 3 weeks, for a total of 6 cycles. Herceptin and Perjeta were held for last two cycles due to drop of her EF on echo       05/25/2016 - 07/11/2016 Radiation Therapy    Adjuvant breast radiation      06/08/2016 -  Chemotherapy    She started maintenance Herceptin every 3 weeks, after clearance from cardiology.      08/11/2016 -  Anti-estrogen oral therapy    Exemestane 25 mg daily       HISTORY OF PRESENTING ILLNESS:  Sandra James 50 y.o. female with past medical history of stage I left breast cancer, is here because of newly diagnosed right breast cancer. She presents to our multidisciplinary breast clinic by herself.  The right breast cancer was discovered by screening mammogram. She did not have any palpable mass, or any constitutional symptoms. She was diagnosed with stage I left breast cancer at age of 64, she had lumpectomy, radiation, and adjuvant chemotherapy and 5 years of tamoxifen. She was treated by Dr. Louann Sjogren in [place]. She moved to [facility] about year ago due to job  change. She has been very compliant with annual screening mammogram.  She had hysterectomy and bilateral oophorectomy and sphincterectomy 5 years ago for heavy bleeding.. She has been having hot flashes since then, moderate, but manageable. She is single, lives alone, works for 2 to [employer] has to go up children who live in [place].  CURRENT THERAPY:  1. maintenance Herceptin every 3 weeks  2. Exemestane 25 mg daily, started on 08/11/2016  Sandra James returns for follow-up and herceptin treatment. She was seen by our symptom management clinic about 2 weeks ago for her worsening headaches, brain MRI was  ordered, but has not been scheduled due to the concern of her breast implants. She states her headaches has slightly improved lately, no other neurological symptoms. She's been tolerating exemestane well overall. She still has moderate hot flash, Neurontin did not help much, she also noticed mood swing daily. He denies depression. Her appetite and and this level are moderate to good. She still has issues with insomnia if she does not take Ambien. She works 2 jobs, quite stressed.  MED ICAL HISTORY:  Past Medical History:  Diagnosis Date  . Allergy   . Anxiety   . Breast cancer ([place]) 09/07/15   right breast  . Breast cancer of upper-outer quadrant of right female breast ([place]) 09/09/2015  . Cancer [facility])    left brast lumpectomy   . Complication of anesthesia    slow to wake up- BP was low, fainted next day-  . Depression   . Diabetes mellitus without complication ([place])   . Fibroid   . GERD (gastroesophageal reflux disease)   . Infection    UTI  . MVP (mitral valve prolapse)   . Neuromuscular disorder ([place])    sciatic nerve paion left side/leg  . UTI (lower urinary tract infection)     SURGICAL HISTORY: Past Surgical History:  Procedure Laterality Date  . ABDOMINAL HYSTERECTOMY    . BREAST RECONSTRUCTION WITH PLACEMENT OF TISSUE EXPANDER AND FLEX HD (ACELLULAR HYDRATED DERMIS) Bilateral 11/25/2015   Procedure: BILATERAL IMMEDIATE BREAST RECONSTRUCTION WITH PLACEMENT OF TISSUE EXPANDERS AND FLEX HD (ACELLULAR HYDRATED DERMIS);  Surgeon: Wallace Going, DO;  Location[place];  Service: Plastics;  Laterality: Bilateral;  . BREAST SURGERY  2004   left lumpectomy  . MASTECTOMY W/ SENTINEL NODE BIOPSY Bilateral 11/25/2015   Procedure: BILATERAL SKIN SPARING MASTECTOMIES WITH RIGHT SENTINEL LYMPH NODE BIOPSY;  Surgeon: Stark Klein, MD;  Location: [place];  Service: General;  Laterality: Bilateral;  . OOPHORECTOMY Bilateral   . PORTACATH PLACEMENT  Left 12/20/2015   Procedure: INSERTION PORT-A-CATH, left chest;  Surgeon: Stark Klein, MD;  Location: [place];  Service: General;  Laterality: Left;    SOCIAL HISTORY: Social History   Social History  . Marital Status: Single     Spouse Name: N/A  . Number of Children: 2 children, 33 daughter and 15 yo son    . Years of Education: N/A   Occupational History  . She is a Barista rep   Social History Main Topics  . Smoking status: Never Smoker   . Smokeless tobacco: Never Used  . Alcohol Use: Yes     Comment: 2-3/wk  . Drug Use: No  . Sexual Activity: Yes    Birth Control/ Protection: Surgical   Other Topics Concern  . Not on file   Social History Narrative    FAMILY HISTORY: Family History  Problem Relation Age of Onset  .  Hypertension Mother   . Stroke Mother   . Alzheimer's disease Mother   . Heart disease Father   . Stroke Father   . Heart attack Father   . Breast cancer Sister 3    double mastectomy  . Other Sister     "stomach tumor that wrapped around reproductive organs"; dx. 42s; required partial hysterectomy  . Alzheimer's disease Maternal Grandmother   . Alzheimer's disease Paternal Grandmother   . Other Other     had to have lymph nodes removed and radiation; dx. 2s  . Cancer Maternal Aunt     unspecified type; dx. <50  . Colon cancer Maternal Uncle     dx. 70s  . Breast cancer Cousin     dx. 9s    ALLERGIES:  is allergic to lisinopril; oxycodone; and percocet [oxycodone-acetaminophen].  MEDICATIONS:  Current Outpatient Prescriptions  Medication Sig Dispense Refill  . bisoprolol (ZEBETA) 5 MG tablet Take 0.5 tablets (2.5 mg total) by mouth at bedtime. 15 tablet 3  . exemestane (AROMASIN) 25 MG tablet Take 1 tablet (25 mg total) by mouth daily after breakfast. 30 tablet 1  . fluticasone (FLONASE) 50 MCG/ACT nasal spray Place 1 spray into both nostrils daily as needed for allergies.   0  . ibuprofen (ADVIL,MOTRIN) 800 MG tablet Take 800 mg by  mouth daily as needed for headache or moderate pain.    . Iron-Vitamins (GERITOL) LIQD Take 30 mLs by mouth daily. Energy support    . losartan (COZAAR) 25 MG tablet Take 0.5 tablets (12.5 mg total) by mouth daily. 90 tablet 3  . traMADol (ULTRAM) 50 MG tablet Take 1 tablet (50 mg total) by mouth every 6 (six) hours as needed. 50 tablet 0  . venlafaxine XR (EFFEXOR XR) 37.5 MG 24 hr capsule Take 1 capsule (37.5 mg total) by mouth daily with breakfast. 45 capsule 0  . zolpidem (AMBIEN) 10 MG tablet Take 1 tablet (10 mg total) by mouth at bedtime as needed for sleep. 30 tablet 2   No current facility-administered medications for this visit.    Facility-Administered Medications Ordered in Other Visits  Medication Dose Route Frequency Provider Last Rate Last Dose  . sodium chloride flush (NS) 0.9 % injection 10 mL  10 mL Intracatheter PRN Truitt Merle, MD   10 mL at 10/12/16 1120    REVIEW OF SYSTEMS:   Constitutional: Denies fevers, chills or abnormal night sweats Eyes: Denies blurriness of vision, double vision or watery eyes Ears, nose, mouth, throat, and face: Denies mucositis or sore throat Respiratory: Denies cough, dyspnea or wheezes Cardiovascular: Denies palpitation, chest discomfort or lower extremity swelling Gastrointestinal:  Denies nausea, heartburn or change in bowel habits Skin: Denies abnormal skin rashes Lymphatics: Denies new lymphadenopathy or easy bruising Neurological:Denies numbness, tingling or new weaknesses Behavioral/Psych: Mood is stable, no new changes  All other systems were reviewed with the patient and are negative.  PHYSICAL EXAMINATION: ECOG PERFORMANCE STATUS: 1 Blood pressure 95/65, pulse 98, respiratory rate 18, temperature 36.9, pulse ox 98% on room air GENERAL:alert, no distress and comfortable SKIN: skin color, texture, turgor are normal, no rashes or significant lesions EYES: normal, conjunctiva are pink and non-injected, sclera clear, eye lids appears  normal OROPHARYNX:no exudate, no erythema and lips, buccal mucosa, and tongue normal  NECK: supple, thyroid normal size, non-tender, without nodularity LYMPH:  no palpable lymphadenopathy in the cervical, axillary or inguinal LUNGS: clear to auscultation and percussion with normal breathing effort HEART: regular rate & rhythm  and no murmurs and no lower extremity edema ABDOMEN:abdomen soft, non-tender and normal bowel sounds Musculoskeletal:no cyanosis of digits and no clubbing  PSYCH: alert & oriented x 3 with fluent speech NEURO: no focal motor/sensory deficits Breasts: s/p bilateral mastectomy and tissue spender placement. Surgical incision sites are clean and well healed. (+) Diffuse skin pigmentation in the right breast, no skin ulcers.     LABORATORY DATA:  I have reviewed the data as listed CBC Latest Ref Rng & Units 10/12/2016 09/21/2016 08/31/2016  WBC 3.9 - 10.3 10e3/uL 4.6 4.8 4.3  Hemoglobin 11.6 - 15.9 g/dL 12.6 11.4(L) 12.0  Hematocrit 34.8 - 46.6 % 37.7 33.8(L) 34.7(L)  Platelets 145 - 400 10e3/uL 331 237 234    CMP Latest Ref Rng & Units 10/12/2016 09/21/2016 08/31/2016  Glucose 70 - 140 mg/dl 101 101 91  BUN 7.0 - 26.0 mg/dL 8.5 8.6 8.0  Creatinine 0.6 - 1.1 mg/dL 0.7 0.7 0.8  Sodium 136 - 145 mEq/L 143 140 141  Potassium 3.5 - 5.1 mEq/L 3.5 3.6 3.7  Chloride 101 - 111 mmol/L - - -  CO2 22 - 29 mEq/L 25 24 25   Calcium 8.4 - 10.4 mg/dL 9.5 9.3 9.7  Total Protein 6.4 - 8.3 g/dL 7.9 7.4 8.0  Total Bilirubin 0.20 - 1.20 mg/dL 0.35 0.78 1.04  Alkaline Phos 40 - 150 U/L 83 69 78  AST 5 - 34 U/L 18 13 13   ALT 0 - 55 U/L 19 11 11      Pathology report Diagnosis 11/25/2015 1. Breast, simple mastectomy, right - INVASIVE DUCTAL CARCINOMA, GRADE 2/3, SPANNING 2.0 CM. - DUCTAL CARCINOMA IN SITU, INTERMEDIATE GRADE. - LYMPHOVASCULAR INVASION IS IDENTIFIED. - LOBULAR NEOPLASIA (ATYPICAL LOBULAR HYPERPLASIA). - THE SURGICAL RESECTION MARGINS ARE NEGATIVE FOR CARCINOMA. -  SEE ONCOLOGY TABLE BELOW. 2. Lymph node, sentinel, biopsy, right axillary #1 - METASTATIC CARCINOMA IN 1 OF 1 LYMPH NODE (1/1). 3. Lymph node, sentinel, biopsy, right axillary #2 - THERE IS NO EVIDENCE OF CARCINOMA IN 1 OF 1 LYMPH NODE (0/1). 4. Lymph node, sentinel, biopsy, right axillary #3 - THERE IS NO EVIDENCE OF CARCINOMA IN 1 OF 1 LYMPH NODE (0/1). 5. Breast, simple mastectomy, left - BENIGN BREAST PARENCHYMA WITH DENSE STROMAL FIBROSIS. - FIBROADENOMA. - THERE IS NO EVIDENCE OF MALIGNANCY. 6. Breast, excision, left, additional lateral margin - BENIGN FIBROADIPOSE TISSUE. - THERE IS NO EVIDENCE OF MALIGNANCY. - SEE COMMENT. Microscopic Comment 1. BREAST, INVASIVE TUMOR, WITH LYMPH NODES PRESENT Specimen, including laterality and lymph node sampling (sentinel, non-sentinel): Right breast and right axillary lymph nodes Procedure: Bilateral mastectomy and multiple right axillary lymph node resections Histologic type: Ductal Grade: 2 Tubule formation: 2 1 of 4 FINAL for Kenneth, Walterine ([id number]) Microscopic Comment(continued) Nuclear pleomorphism: 2 Mitotic: 2 Tumor size (gross measurement): 2.0 cm Margins: Negative for carcinoma Invasive, distance to closest margin: 1.8 cm to the posterior margin In-situ, distance to closest margin: 1.8 cm to the posterior margin Lymphovascular invasion: Present Ductal carcinoma in situ: Present Grade: Intermediate grade Extensive intraductal component: Not identified Lobular neoplasia: Present, atypical lobular hyperplasia Tumor focality: Unifocal Treatment effect: Not identified Extent of tumor: Confined to breast parenchyma Lymph nodes: Examined: 3 Sentinel 0 Non-sentinel 3 Total Lymph nodes with metastasis: 1 (macrometastasis) - no extracapsular extension is identified. Breast prognostic profile: [phone] Estrogen receptor: 100%, strong staining intensity Progesterone receptor: 100%, strong staining intensity Her 2  neu: Amplification was detected. The ratio was 2.58 Ki-67: 60% TNM: pT1c, pN1a Non-neoplastic breast: No significant findings.  6. The surgical resection margin(s) of the specimen were inked and microscopically evaluated. Enid Cutter MD Pathologist, Electronic Signature (Case signed 11/29/2015)   RADIOGRAPHIC STUDIES: I have personally reviewed the radiological images as listed and agreed with the findings in the report.  Bone scan 12/28/2014 IMPRESSION: Foci of increased tracer localization at the sternum, unable to exclude metastases.  Uptake at inferior cervical spine could potentially be related to degenerative disc disease changes present at C5-C6 on CT.  Questionable abnormal increased tracer localization at the posterior LEFT ninth and tenth ribs.  CT chest, abdomen and pelvis 12/29/2015 IMPRESSION: 1. 2 cm enhancing lesion in segment 5 of the liver is indeterminate. It could reflect FNH, vascular shunt and less likely metastasis. Recommend MRI abdomen without and with contrast using Eovist for further evaluation. No other hepatic lesions. 2. No findings for metastatic disease involving the chest. There is a focal area of airspace disease in the superior segment of the right lower lobe which could be a developing or resolving infiltrate. Recommend correlation with clinical symptoms.  ECHO 08/02/2016 Impressions:  - Normal LV size with EF 55%. Strain as noted above. Normal RV size   and systolic function.  ASSESSMENT & PLAN:  51 year old African-American female, surgical postmenopausal, history of stage I left breast cancer, with newly diagnosed right breast cancer.  1. Right breast invasive ductal carcinoma, pT1cN1aM0, stage IIB, grade 2, ER100%+/PR100%+/HER2+ -I previously reviewed her surgical pathology results with patient in great details. She has 1 out of 3 sentinel lymph nodes positive, her pathological stage is more advanced than her initial clinical stage.    -We reviewed the natural history of triple positive breast cancer. HER-2 positive tumors are more progressive, with higher risk of recurrence -I reviewed her restaging CT scan and bone scan images with her in person. I discussed the bone scan findings with radiologist Dr. Thornton Papas. The mild hypermetabolic uptake in the sternum and left ninth and 10th rib have no significant corresponding change on the CT scan, except for small lytic spot in the sternum. They're not amenable for biopsy, PET scan may not have much additional value, the suspicion for metastases from breast cancer is low. I recommend follow-up. The 2 cm lesion in the liver are likely benign, unfortunately she is not able to do liver MRI, due to her breast implants.  -She is still concerned about the above scan findings. I recommend to have a repeated CT abdomen and bone scan for follow up since she has completed chemo  -She has completed adjuvant chemotherapy TCHP, Herceptin and pejeta were held for the last 2 cycle due to her decreased EF. -Her repeated echo has showed normal EF, she has restarted Herceptin maintenance therapy. -She  has now completed adjuvant breast and axilla radiation and tolerated well. -She previously could not tolerate letrozole. She has been tolerating exemestane well lately. -Due to her mood swing and hot flash, I recommend her to start Effexor 37.5 mg daily, if she tolerates well, we'll go up to 75 mg daily after 2 weeks. Potential benefit and side effects discussed with her, she agrees. -Lab results reviewed with her, we'll continue Herceptin maintenance therapy  2. Genetics -Due to her young age and recurrent breast cancer, she was referred to see a genetic counselor in our cancer center. -Her genetic test was negative  3. DM -she will continue follow-up with her primary care physician -She is on metformin  4. Bone health -She is postmenopausal by surgery -I encouraged her to take  calcium and vitamin  D -I'll obtain a baseline bone density scan in the next few months   6. Insomnia and low appetite  - improved some, continue mirtazapine -she has not been able to wean off ambien, will continue for now   7. Headaches - probably secondary to her cervical spine degenerative arthritis -Improved after steroids injection, however it got worse again lately. -Not able to get a brain MRI, I'll order a CT head with and without contrast to rule out brain metastasis.   Plan for today  -Lab reviewed, continue Herceptin. -Continue exemestane -I called in Effexor 37.5 mg daily, if she tolerates well, she will increase her dose to 75 mg daily. She'll stop Neurontin. -CT abdomen and pelvis, CT head, and a bone scan in the next 2 weeks -bone density scan in the next month  -I will see her back in 3 weeks   All questions were answered. The patient knows to call the clinic with any problems, questions or concerns.  I spent 25 minutes counseling the patient face to face. The total time spent in the appointment was 30 minutes and more than 50% was on counseling.     Truitt Merle, MD 10/12/2016

## 2016-10-12 NOTE — Patient Instructions (Signed)
[facility] Discharge Instructions for Patients Receiving Chemotherapy  Today you received the following therapy: Herceptin.  To help prevent nausea and vomiting after your treatment, we encourage you to take your nausea medication as directed   If you develop nausea and vomiting that is not controlled by your nausea medication, call the clinic.   BELOW ARE SYMPTOMS THAT SHOULD BE REPORTED IMMEDIATELY:  *FEVER GREATER THAN 100.5 F  *CHILLS WITH OR WITHOUT FEVER  NAUSEA AND VOMITING THAT IS NOT CONTROLLED WITH YOUR NAUSEA MEDICATION  *UNUSUAL SHORTNESS OF BREATH  *UNUSUAL BRUISING OR BLEEDING  TENDERNESS IN MOUTH AND THROAT WITH OR WITHOUT PRESENCE OF ULCERS  *URINARY PROBLEMS  *BOWEL PROBLEMS  UNUSUAL RASH Items with * indicate a potential emergency and should be followed up as soon as possible.  Feel free to call the clinic you have any questions or concerns. The clinic phone number is [phone].  Please show the [facility] at check-in to the Emergency Department and triage nurse.

## 2016-10-17 MED FILL — EXEMESTANE 25 MG TABLET: 25 | 30 days supply | Qty: 30 | Fill #1

## 2016-10-17 MED FILL — ZOLPIDEM TARTRATE 10 MG TAB: 10 | 30 days supply | Qty: 30 | Fill #2

## 2016-10-27 ENCOUNTER — Encounter (HOSPITAL_COMMUNITY): Payer: BLUE CROSS/BLUE SHIELD

## 2016-10-27 ENCOUNTER — Ambulatory Visit (HOSPITAL_COMMUNITY): Payer: BLUE CROSS/BLUE SHIELD

## 2016-10-30 ENCOUNTER — Ambulatory Visit (HOSPITAL_COMMUNITY): Payer: BLUE CROSS/BLUE SHIELD

## 2016-11-01 ENCOUNTER — Ambulatory Visit (HOSPITAL_BASED_OUTPATIENT_CLINIC_OR_DEPARTMENT_OTHER): Payer: BLUE CROSS/BLUE SHIELD | Admitting: Hematology

## 2016-11-01 ENCOUNTER — Encounter: Payer: Self-pay | Admitting: Hematology

## 2016-11-01 ENCOUNTER — Ambulatory Visit (HOSPITAL_BASED_OUTPATIENT_CLINIC_OR_DEPARTMENT_OTHER): Payer: BLUE CROSS/BLUE SHIELD

## 2016-11-01 VITALS — BP 94/64 | HR 90 | Temp 98.6°F | Resp 17 | Ht 66.0 in | Wt 139.1 lb

## 2016-11-01 DIAGNOSIS — E119 Type 2 diabetes mellitus without complications: Secondary | ICD-10-CM | POA: Diagnosis not present

## 2016-11-01 DIAGNOSIS — Z5112 Encounter for antineoplastic immunotherapy: Secondary | ICD-10-CM

## 2016-11-01 DIAGNOSIS — I427 Cardiomyopathy due to drug and external agent: Secondary | ICD-10-CM | POA: Diagnosis not present

## 2016-11-01 DIAGNOSIS — T451X5A Adverse effect of antineoplastic and immunosuppressive drugs, initial encounter: Secondary | ICD-10-CM

## 2016-11-01 DIAGNOSIS — C50411 Malignant neoplasm of upper-outer quadrant of right female breast: Secondary | ICD-10-CM

## 2016-11-01 DIAGNOSIS — R51 Headache: Secondary | ICD-10-CM

## 2016-11-01 DIAGNOSIS — Z170 Estrogen receptor positive status [ER+]: Secondary | ICD-10-CM

## 2016-11-01 MED ORDER — SODIUM CHLORIDE 0.9% FLUSH
10.0000 mL | INTRAVENOUS | Status: DC | PRN
  Administered 2016-11-01: 10 mL
  Filled 2016-11-01: qty 10

## 2016-11-01 MED ORDER — DIPHENHYDRAMINE HCL 25 MG PO CAPS
ORAL_CAPSULE | ORAL | Status: AC
  Filled 2016-11-01: qty 2

## 2016-11-01 MED ORDER — DIPHENHYDRAMINE HCL 25 MG PO CAPS
50.0000 mg | ORAL_CAPSULE | Freq: Once | ORAL | Status: AC
  Administered 2016-11-01: 50 mg via ORAL

## 2016-11-01 MED ORDER — TRASTUZUMAB CHEMO 150 MG IV SOLR
6.0000 mg/kg | Freq: Once | INTRAVENOUS | Status: AC
  Administered 2016-11-01: 378 mg via INTRAVENOUS
  Filled 2016-11-01: qty 18

## 2016-11-01 MED ORDER — SODIUM CHLORIDE 0.9 % IV SOLN
Freq: Once | INTRAVENOUS | Status: AC
  Administered 2016-11-01: 11:00:00 via INTRAVENOUS

## 2016-11-01 MED ORDER — HEPARIN SOD (PORK) LOCK FLUSH 100 UNIT/ML IV SOLN
500.0000 [IU] | Freq: Once | INTRAVENOUS | Status: AC | PRN
  Administered 2016-11-01: 500 [IU]
  Filled 2016-11-01: qty 5

## 2016-11-01 MED ORDER — ZOLPIDEM TARTRATE 10 MG PO TABS: 10.0000 mg | ORAL_TABLET | Freq: Every evening | ORAL | 2 refills | Status: DC | PRN

## 2016-11-01 MED ORDER — ACETAMINOPHEN 325 MG PO TABS
ORAL_TABLET | ORAL | Status: AC
  Filled 2016-11-01: qty 2

## 2016-11-01 MED ORDER — ACETAMINOPHEN 325 MG PO TABS
650.0000 mg | ORAL_TABLET | Freq: Once | ORAL | Status: AC
  Administered 2016-11-01: 650 mg via ORAL

## 2016-11-01 NOTE — Progress Notes (Signed)
Sandra James  Clinic follow Up Note   Patient Care Team: Sandra Cowman, MD as PCP - General (Family Medicine) Sandra Klein, MD as Consulting Physician (General Surgery) Sandra Merle, MD as Consulting Physician (Hematology) Sandra Koh, MD as Consulting Physician (Radiation Oncology) Sandra Kaufmann, RN as Registered Nurse Sandra Germany, RN as Registered Nurse Sandra Cheese, NP as Nurse Practitioner (Nurse Practitioner) 11/01/2016  CHIEF COMPLAINTS:  Follow up right breast cancer  Oncology History   Breast cancer [facility])   Staging form: Breast, AJCC 7th Edition     Pathologic stage from 11/25/2015: Stage IIA (T1c, N1a, cM0) - Unsigned     Pathologic stage from 11/25/2015: Stage IIA (T1c, N1a, cM0) - Unsigned Breast cancer of upper-outer quadrant of right female breast [facility])   Staging form: Breast, AJCC 7th Edition     Clinical stage from 09/15/2015: Stage IA (T1b, N0, M0) - Signed by Sandra Merle, MD on 12/16/2015     Pathologic stage from 11/25/2015: Stage IIA (T1c, N1a, cM0) - Signed by Sandra Merle, MD on 12/16/2015         Breast cancer of upper-outer quadrant of right female breast ([place])   09/07/2015 Mammogram    Diagnostic mammogram and the ultrasound of the right breast showed a 0.8 cm lobulated mass in the right breast 11 to 12:00 position 9 cm from the nipple. No other lesions or adenopathy.      09/08/2015 Initial Diagnosis    Breast cancer of upper-outer quadrant of right female breast ([place])      09/08/2015 Initial Biopsy    Right breast mass at the upper outer quadrant core needle biopsy showed invasive ductal carcinoma, grade 2-3, ductal carcinoma in situ.      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 Left breast ostectomy was negative for malignancy.      01/06/2016 - 04/20/2016 Chemotherapy    adjuvant chemotherapy TCH P, every 3 weeks, for a total of 6 cycles. Herceptin and Perjeta were held for last two cycles due to drop of her EF on echo       05/25/2016 - 07/11/2016 Radiation Therapy    Adjuvant breast radiation      06/08/2016 -  Chemotherapy    She started maintenance Herceptin every 3 weeks, after clearance from cardiology.      08/11/2016 -  Anti-estrogen oral therapy    Exemestane 25 mg daily       HISTORY OF PRESENTING ILLNESS:  Sandra James 50 y.o. female with past medical history of stage I left breast cancer, is here because of newly diagnosed right breast cancer. She presents to our multidisciplinary breast clinic by herself.  The right breast cancer was discovered by screening mammogram. She did not have any palpable mass, or any constitutional symptoms. She was diagnosed with stage I left breast cancer at age of 68, she had lumpectomy, radiation, and adjuvant chemotherapy and 5 years of tamoxifen. She was treated by Dr. Louann James in [place]. She moved to [facility] about year ago due to job  change. She has been very compliant with annual screening mammogram.  She had hysterectomy and bilateral oophorectomy and sphincterectomy 5 years ago for heavy bleeding.. She has been having hot flashes since then, moderate, but manageable. She is single, lives alone, works for 2 to Sandra James has to go up children who live in [place].  CURRENT THERAPY:  1. maintenance Herceptin every 3 weeks, until Feb 2018  2. Exemestane 25 mg daily, started on 08/11/2016  [name] returns for follow-up and herceptin treatment.  She is doing well overall, has tried Effexor for hot flash, did not help her much, has not been  compliant. Headaches has improved, no other neurological symptoms. No other new complaints  MED ICAL HISTORY:  Past Medical History:  Diagnosis Date  . Allergy   . Anxiety   . Breast cancer ([place]) 09/07/15   right breast  . Breast cancer of upper-outer quadrant of right female breast ([place]) 09/09/2015  . Cancer [facility])    left brast lumpectomy   . Complication of anesthesia    slow to wake up- BP was low, fainted next day-  . Depression   . Diabetes mellitus without complication ([place])   . Fibroid   . GERD (gastroesophageal reflux disease)   . Infection    UTI  . MVP (mitral valve prolapse)   . Neuromuscular disorder ([place])    sciatic nerve paion left side/leg  . UTI (lower urinary tract infection)     SURGICAL HISTORY: Past Surgical History:  Procedure Laterality Date  . ABDOMINAL HYSTERECTOMY    . BREAST RECONSTRUCTION WITH PLACEMENT OF TISSUE EXPANDER AND FLEX HD (ACELLULAR HYDRATED DERMIS) Bilateral 11/25/2015   Procedure: BILATERAL IMMEDIATE BREAST RECONSTRUCTION WITH PLACEMENT OF TISSUE EXPANDERS AND FLEX HD (ACELLULAR HYDRATED DERMIS);  Surgeon: Sandra Going, DO;  Location: [place];  Service: Plastics;  Laterality: Bilateral;  . BREAST SURGERY  2004   left lumpectomy  . MASTECTOMY W/ SENTINEL NODE BIOPSY Bilateral 11/25/2015   Procedure: BILATERAL SKIN SPARING MASTECTOMIES WITH RIGHT SENTINEL LYMPH NODE BIOPSY;  Surgeon: Sandra Klein, MD;  Location: [place];  Service: General;  Laterality: Bilateral;  . OOPHORECTOMY Bilateral   . PORTACATH PLACEMENT Left 12/20/2015   Procedure: INSERTION PORT-A-CATH, left chest;  Surgeon: Sandra Klein, MD;  Location: [place];  Service: General;  Laterality: Left;    SOCIAL HISTORY: Social History   Social History  . Marital Status: Single     Spouse Name: N/A  . Number of Children: 2 children, 40 daughter and 19 yo son    . Years of Education: N/A   Occupational History  . She is a Barista rep    Social History Main Topics  . Smoking status: Never Smoker   . Smokeless tobacco: Never Used  . Alcohol Use: Yes     Comment: 2-3/wk  . Drug Use: No  . Sexual Activity: Yes    Birth Control/ Protection: Surgical   Other Topics Concern  . Not on file   Social History Narrative    FAMILY HISTORY: Family History  Problem Relation Age of Onset  . Hypertension Mother   . Stroke Mother   . Alzheimer's disease Mother   . Heart disease Father   . Stroke Father   . Heart attack Father   . Breast cancer Sister 12    double mastectomy  . Other Sister     "stomach tumor that wrapped around reproductive organs"; dx. 6s; required partial hysterectomy  .  Alzheimer's disease Maternal Grandmother   . Alzheimer's disease Paternal Grandmother   . Other Other     had to have lymph nodes removed and radiation; dx. 71s  . Cancer Maternal Aunt     unspecified type; dx. <50  . Colon cancer Maternal Uncle     dx. 81s  . Breast cancer Cousin     dx. 71s    ALLERGIES:  is allergic to lisinopril; oxycodone; and percocet [oxycodone-acetaminophen].  MEDICATIONS:  Current Outpatient Prescriptions  Medication Sig Dispense Refill  . bisoprolol (ZEBETA) 5 MG tablet Take 0.5 tablets (2.5 mg total) by mouth at bedtime. 15 tablet 3  . exemestane (AROMASIN) 25 MG tablet Take 1 tablet (25 mg total) by mouth daily after breakfast. 30 tablet 1  . fluticasone (FLONASE) 50 MCG/ACT nasal spray Place 1 spray into both nostrils daily as needed for allergies.   0  . ibuprofen (ADVIL,MOTRIN) 800 MG tablet Take 800 mg by mouth daily as needed for headache or moderate pain.    . Iron-Vitamins (GERITOL) LIQD Take 30 mLs by mouth daily. Energy support    . losartan (COZAAR) 25 MG tablet Take 0.5 tablets (12.5 mg total) by mouth daily. 90 tablet 3  . traMADol (ULTRAM) 50 MG tablet Take 1 tablet (50 mg total) by mouth every 6 (six) hours as needed. 50 tablet 0  . venlafaxine XR (EFFEXOR XR) 37.5 MG 24 hr capsule  Take 1 capsule (37.5 mg total) by mouth daily with breakfast. 45 capsule 0  . zolpidem (AMBIEN) 10 MG tablet Take 1 tablet (10 mg total) by mouth at bedtime as needed for sleep. 30 tablet 2   No current facility-administered medications for this visit.     REVIEW OF SYSTEMS:   Constitutional: Denies fevers, chills or abnormal night sweats Eyes: Denies blurriness of vision, double vision or watery eyes Ears, nose, mouth, throat, and face: Denies mucositis or sore throat Respiratory: Denies cough, dyspnea or wheezes Cardiovascular: Denies palpitation, chest discomfort or lower extremity swelling Gastrointestinal:  Denies nausea, heartburn or change in bowel habits Skin: Denies abnormal skin rashes Lymphatics: Denies new lymphadenopathy or easy bruising Neurological:Denies numbness, tingling or new weaknesses Behavioral/Psych: Mood is stable, no new changes  All other systems were reviewed with the patient and are negative.  PHYSICAL EXAMINATION: ECOG PERFORMANCE STATUS: 1 Blood pressure 95/65, pulse 98, respiratory rate 18, temperature 36.9, pulse ox 98% on room air GENERAL:alert, no distress and comfortable SKIN: skin color, texture, turgor are normal, no rashes or significant lesions EYES: normal, conjunctiva are pink and non-injected, sclera clear, eye lids appears normal OROPHARYNX:no exudate, no erythema and lips, buccal mucosa, and tongue normal  NECK: supple, thyroid normal size, non-tender, without nodularity LYMPH:  no palpable lymphadenopathy in the cervical, axillary or inguinal LUNGS: clear to auscultation and percussion with normal breathing effort HEART: regular rate & rhythm and no murmurs and no lower extremity edema ABDOMEN:abdomen soft, non-tender and normal bowel sounds Musculoskeletal:no cyanosis of digits and no clubbing  PSYCH: alert & oriented x 3 with fluent speech NEURO: no focal motor/sensory deficits Breasts: s/p bilateral mastectomy and tissue spender  placement. Surgical incision sites are clean and well healed. (+) Diffuse skin pigmentation in the right breast, no skin ulcers.     LABORATORY DATA:  I have reviewed the data as listed CBC Latest Ref Rng & Units 10/12/2016 09/21/2016 08/31/2016  WBC 3.9 - 10.3 10e3/uL 4.6 4.8 4.3  Hemoglobin 11.6 - 15.9 g/dL 12.6 11.4(L) 12.0  Hematocrit 34.8 -  46.6 % 37.7 33.8(L) 34.7(L)  Platelets 145 - 400 10e3/uL 331 237 234    CMP Latest Ref Rng & Units 10/12/2016 09/21/2016 08/31/2016  Glucose 70 - 140 mg/dl 101 101 91  BUN 7.0 - 26.0 mg/dL 8.5 8.6 8.0  Creatinine 0.6 - 1.1 mg/dL 0.7 0.7 0.8  Sodium 136 - 145 mEq/L 143 140 141  Potassium 3.5 - 5.1 mEq/L 3.5 3.6 3.7  Chloride 101 - 111 mmol/L - - -  CO2 22 - 29 mEq/L 25 24 25   Calcium 8.4 - 10.4 mg/dL 9.5 9.3 9.7  Total Protein 6.4 - 8.3 g/dL 7.9 7.4 8.0  Total Bilirubin 0.20 - 1.20 mg/dL 0.35 0.78 1.04  Alkaline Phos 40 - 150 U/L 83 69 78  AST 5 - 34 U/L 18 13 13   ALT 0 - 55 U/L 19 11 11      Pathology report Diagnosis 11/25/2015 1. Breast, simple mastectomy, right - INVASIVE DUCTAL CARCINOMA, GRADE 2/3, SPANNING 2.0 CM. - DUCTAL CARCINOMA IN SITU, INTERMEDIATE GRADE. - LYMPHOVASCULAR INVASION IS IDENTIFIED. - LOBULAR NEOPLASIA (ATYPICAL LOBULAR HYPERPLASIA). - THE SURGICAL RESECTION MARGINS ARE NEGATIVE FOR CARCINOMA. - SEE ONCOLOGY TABLE BELOW. 2. Lymph node, sentinel, biopsy, right axillary #1 - METASTATIC CARCINOMA IN 1 OF 1 LYMPH NODE (1/1). 3. Lymph node, sentinel, biopsy, right axillary #2 - THERE IS NO EVIDENCE OF CARCINOMA IN 1 OF 1 LYMPH NODE (0/1). 4. Lymph node, sentinel, biopsy, right axillary #3 - THERE IS NO EVIDENCE OF CARCINOMA IN 1 OF 1 LYMPH NODE (0/1). 5. Breast, simple mastectomy, left - BENIGN BREAST PARENCHYMA WITH DENSE STROMAL FIBROSIS. - FIBROADENOMA. - THERE IS NO EVIDENCE OF MALIGNANCY. 6. Breast, excision, left, additional lateral margin - BENIGN FIBROADIPOSE TISSUE. - THERE IS NO EVIDENCE OF  MALIGNANCY. - SEE COMMENT. Microscopic Comment 1. BREAST, INVASIVE TUMOR, WITH LYMPH NODES PRESENT Specimen, including laterality and lymph node sampling (sentinel, non-sentinel): Right breast and right axillary lymph nodes Procedure: Bilateral mastectomy and multiple right axillary lymph node resections Histologic type: Ductal Grade: 2 Tubule formation: 2 1 of 4 FINAL for Loughry, Alaylah ([id number]) Microscopic Comment(continued) Nuclear pleomorphism: 2 Mitotic: 2 Tumor size (gross measurement): 2.0 cm Margins: Negative for carcinoma Invasive, distance to closest margin: 1.8 cm to the posterior margin In-situ, distance to closest margin: 1.8 cm to the posterior margin Lymphovascular invasion: Present Ductal carcinoma in situ: Present Grade: Intermediate grade Extensive intraductal component: Not identified Lobular neoplasia: Present, atypical lobular hyperplasia Tumor focality: Unifocal Treatment effect: Not identified Extent of tumor: Confined to breast parenchyma Lymph nodes: Examined: 3 Sentinel 0 Non-sentinel 3 Total Lymph nodes with metastasis: 1 (macrometastasis) - no extracapsular extension is identified. Breast prognostic profile: [phone] Estrogen receptor: 100%, strong staining intensity Progesterone receptor: 100%, strong staining intensity Her 2 neu: Amplification was detected. The ratio was 2.58 Ki-67: 60% TNM: pT1c, pN1a Non-neoplastic breast: No significant findings. 6. The surgical resection margin(s) of the specimen were inked and microscopically evaluated. Enid Cutter MD Pathologist, Electronic Signature (Case signed 11/29/2015)   RADIOGRAPHIC STUDIES: I have personally reviewed the radiological images as listed and agreed with the findings in the report.  Bone scan 12/28/2014 IMPRESSION: Foci of increased tracer localization at the sternum, unable to exclude metastases.  Uptake at inferior cervical spine could potentially be related  to degenerative disc disease changes present at C5-C6 on CT.  Questionable abnormal increased tracer localization at the posterior LEFT ninth and tenth ribs.  CT chest, abdomen and pelvis 12/29/2015 IMPRESSION: 1. 2 cm enhancing lesion in segment  5 of the liver is indeterminate. It could reflect FNH, vascular shunt and less likely metastasis. Recommend MRI abdomen without and with contrast using Eovist for further evaluation. No other hepatic lesions. 2. No findings for metastatic disease involving the chest. There is a focal area of airspace disease in the superior segment of the right lower lobe which could be a developing or resolving infiltrate. Recommend correlation with clinical symptoms.  ECHO 08/02/2016 Impressions:  - Normal LV size with EF 55%. Strain as noted above. Normal RV size   and systolic function.  ASSESSMENT & PLAN:  50 year old African-American female, surgical postmenopausal, history of stage I left breast cancer, with newly diagnosed right breast cancer.  1. Right breast invasive ductal carcinoma, pT1cN1aM0, stage IIB, grade 2, ER100%+/PR100%+/HER2+ -I previously reviewed her surgical pathology results with patient in great details. She has 1 out of 3 sentinel lymph nodes positive, her pathological stage is more advanced than her initial clinical stage.  -We reviewed the natural history of triple positive breast cancer. HER-2 positive tumors are more progressive, with higher risk of recurrence -I reviewed her restaging CT scan and bone scan images with her in person. I discussed the bone scan findings with radiologist Dr. Thornton Papas. The mild hypermetabolic uptake in the sternum and left ninth and 10th rib have no significant corresponding change on the CT scan, except for small lytic spot in the sternum. They're not amenable for biopsy, PET scan may not have much additional value, the suspicion for metastases from breast cancer is low. I recommend follow-up. The 2 cm  lesion in the liver are likely benign, unfortunately she is not able to do liver MRI, due to her breast implants.  -She is still concerned about the above scan findings. I recommend to have a repeated CT abdomen and bone scan for follow up, and it's scheduled for 12/4  -She has completed adjuvant chemotherapy TCHP, Herceptin and pejeta were held for the last 2 cycle due to her decreased EF. -Her repeated echo has showed normal EF, she has restarted Herceptin maintenance therapy. -She  has now completed adjuvant breast and axilla radiation and tolerated well. -She previously could not tolerate letrozole. She has been tolerating exemestane well lately. -She still has moderate hot flash, tolerable. She tried Effexor, did not help. -we'll continue Herceptin maintenance therapy until feb 2018   2. Genetics -Due to her young age and recurrent breast cancer, she was referred to see a genetic counselor in our cancer center. -Her genetic test was negative  3. DM -she will continue follow-up with her primary care physician -She is on metformin  4. Bone health -She is postmenopausal by surgery -I encouraged her to take calcium and vitamin D -I'll obtain a baseline bone density scan in the next few months   6. Insomnia and low appetite  - improved some, continue mirtazapine -she has not been able to wean off ambien, will continue for now   7. Headaches - probably secondary to her cervical spine degenerative arthritis -Improved after steroids injection -CT head is scheduled   8. Hot flush -Secondary to menopause and exemestane -I encouraged her to try Effexor 75 mg daily for week, to see if it helps. If does not, she will stop.  Plan for today  -continue Herceptin today and every 3 weeks  -Continue exemestane. -I refilled her Lorrin Mais  -CT abdomen and pelvis, CT head, and a bone scan is scheduled for 12/4 -bone density scan in the next month  -I will  see her back in 3 weeks   All  questions were answered. The patient knows to call the clinic with any problems, questions or concerns.  I spent 25 minutes counseling the patient face to face. The total time spent in the appointment was 30 minutes and more than 50% was on counseling.     Sandra Merle, MD 11/01/2016

## 2016-11-01 NOTE — Patient Instructions (Signed)
[facility] Discharge Instructions for Patients Receiving Chemotherapy  Today you received the following chemotherapy agents: Herceptin   To help prevent nausea and vomiting after your treatment, we encourage you to take your nausea medication as directed.    If you develop nausea and vomiting that is not controlled by your nausea medication, call the clinic.   BELOW ARE SYMPTOMS THAT SHOULD BE REPORTED IMMEDIATELY:  *FEVER GREATER THAN 100.5 F  *CHILLS WITH OR WITHOUT FEVER  NAUSEA AND VOMITING THAT IS NOT CONTROLLED WITH YOUR NAUSEA MEDICATION  *UNUSUAL SHORTNESS OF BREATH  *UNUSUAL BRUISING OR BLEEDING  TENDERNESS IN MOUTH AND THROAT WITH OR WITHOUT PRESENCE OF ULCERS  *URINARY PROBLEMS  *BOWEL PROBLEMS  UNUSUAL RASH Items with * indicate a potential emergency and should be followed up as soon as possible.  Feel free to call the clinic you have any questions or concerns. The clinic phone number is [phone].  Please show the CHEMO ALERT CARD at check-in to the Emergency Department and triage nurse.   

## 2016-11-03 ENCOUNTER — Telehealth: Payer: Self-pay | Admitting: Hematology

## 2016-11-03 NOTE — Telephone Encounter (Signed)
Left message re 12/13 appointments. Schedule mailed.

## 2016-11-09 ENCOUNTER — Telehealth: Payer: Self-pay | Admitting: Hematology

## 2016-11-09 MED FILL — ZOLPIDEM TARTRATE 10 MG TAB: 10 | 30 days supply | Qty: 30 | Fill #0

## 2016-11-09 MED FILL — GABAPENTIN 100 MG CAPSULE: 100 | 30 days supply | Qty: 60 | Fill #1

## 2016-11-09 NOTE — Telephone Encounter (Signed)
Spoke with patient re bone density appointment for 12/8 @ 8:30 am at the [facility] and 12/13 appointments at [facility].

## 2016-11-13 ENCOUNTER — Encounter (HOSPITAL_COMMUNITY)
Admission: RE | Admit: 2016-11-13 | Discharge: 2016-11-13 | Disposition: A | Discharge status: Home / Self Care | Payer: BLUE CROSS/BLUE SHIELD | Source: Ambulatory Visit | Attending: Hematology | Admitting: Hematology

## 2016-11-13 ENCOUNTER — Encounter (HOSPITAL_COMMUNITY): Payer: Self-pay

## 2016-11-13 ENCOUNTER — Ambulatory Visit (HOSPITAL_COMMUNITY)
Admission: RE | Admit: 2016-11-13 | Discharge: 2016-11-13 | Disposition: A | Discharge status: Home / Self Care | Payer: BLUE CROSS/BLUE SHIELD | Source: Ambulatory Visit | Attending: Hematology | Admitting: Hematology

## 2016-11-13 DIAGNOSIS — M5136 Other intervertebral disc degeneration, lumbar region: Secondary | ICD-10-CM | POA: Insufficient documentation

## 2016-11-13 DIAGNOSIS — C50411 Malignant neoplasm of upper-outer quadrant of right female breast: Secondary | ICD-10-CM

## 2016-11-13 DIAGNOSIS — Z170 Estrogen receptor positive status [ER+]: Secondary | ICD-10-CM | POA: Diagnosis not present

## 2016-11-13 DIAGNOSIS — K769 Liver disease, unspecified: Secondary | ICD-10-CM | POA: Insufficient documentation

## 2016-11-13 MED ORDER — IOPAMIDOL (ISOVUE-300) INJECTION 61%
100.0000 mL | Freq: Once | INTRAVENOUS | Status: AC | PRN
  Administered 2016-11-13: 100 mL via INTRAVENOUS

## 2016-11-13 MED ORDER — TECHNETIUM TC 99M MEDRONATE IV KIT: 25.0000 | PACK | Freq: Once | INTRAVENOUS | Status: DC | PRN

## 2016-11-13 MED ORDER — HEPARIN SOD (PORK) LOCK FLUSH 100 UNIT/ML IV SOLN
INTRAVENOUS | Status: AC
  Filled 2016-11-13: qty 5

## 2016-11-13 MED ORDER — SODIUM CHLORIDE 0.9 % IJ SOLN
INTRAMUSCULAR | Status: AC
  Filled 2016-11-13: qty 50

## 2016-11-13 MED ORDER — IOPAMIDOL (ISOVUE-300) INJECTION 61%
INTRAVENOUS | Status: AC
  Filled 2016-11-13: qty 100

## 2016-11-16 ENCOUNTER — Telehealth (HOSPITAL_COMMUNITY): Payer: Self-pay | Admitting: Cardiology

## 2016-11-16 ENCOUNTER — Other Ambulatory Visit (HOSPITAL_COMMUNITY): Payer: Self-pay | Admitting: Cardiology

## 2016-11-16 DIAGNOSIS — I427 Cardiomyopathy due to drug and external agent: Secondary | ICD-10-CM

## 2016-11-16 DIAGNOSIS — T451X5A Adverse effect of antineoplastic and immunosuppressive drugs, initial encounter: Principal | ICD-10-CM

## 2016-11-16 MED ORDER — LOSARTAN POTASSIUM 25 MG PO TABS: 12.5000 mg | ORAL_TABLET | Freq: Every day | ORAL | 3 refills | Status: DC

## 2016-11-16 MED ORDER — BISOPROLOL FUMARATE 5 MG PO TABS: 2.5000 mg | ORAL_TABLET | Freq: Every day | ORAL | 3 refills | Status: DC

## 2016-11-16 NOTE — Telephone Encounter (Signed)
PATIENT WOULD LIKE TO BEGIN EXERCISING, ARE THERE ANY RESTRICTIONS/GUIDELINES SHE SHOULD FOLLOW? [employer] HR?

## 2016-11-17 ENCOUNTER — Ambulatory Visit
Admission: RE | Admit: 2016-11-17 | Discharge: 2016-11-17 | Disposition: A | Discharge status: Home / Self Care | Payer: BLUE CROSS/BLUE SHIELD | Source: Ambulatory Visit | Attending: Hematology | Admitting: Hematology

## 2016-11-17 ENCOUNTER — Other Ambulatory Visit: Payer: BLUE CROSS/BLUE SHIELD

## 2016-11-17 DIAGNOSIS — Z780 Asymptomatic menopausal state: Secondary | ICD-10-CM

## 2016-11-22 ENCOUNTER — Ambulatory Visit (HOSPITAL_BASED_OUTPATIENT_CLINIC_OR_DEPARTMENT_OTHER): Payer: BLUE CROSS/BLUE SHIELD | Admitting: Hematology

## 2016-11-22 ENCOUNTER — Ambulatory Visit (HOSPITAL_BASED_OUTPATIENT_CLINIC_OR_DEPARTMENT_OTHER): Payer: BLUE CROSS/BLUE SHIELD

## 2016-11-22 ENCOUNTER — Telehealth: Payer: Self-pay | Admitting: Hematology

## 2016-11-22 ENCOUNTER — Encounter: Payer: Self-pay | Admitting: Hematology

## 2016-11-22 ENCOUNTER — Other Ambulatory Visit (HOSPITAL_BASED_OUTPATIENT_CLINIC_OR_DEPARTMENT_OTHER): Payer: BLUE CROSS/BLUE SHIELD

## 2016-11-22 VITALS — BP 104/69 | HR 74 | Temp 98.4°F | Resp 18 | Ht 66.0 in | Wt 136.4 lb

## 2016-11-22 DIAGNOSIS — C50411 Malignant neoplasm of upper-outer quadrant of right female breast: Secondary | ICD-10-CM | POA: Diagnosis not present

## 2016-11-22 DIAGNOSIS — E119 Type 2 diabetes mellitus without complications: Secondary | ICD-10-CM

## 2016-11-22 DIAGNOSIS — Z170 Estrogen receptor positive status [ER+]: Secondary | ICD-10-CM

## 2016-11-22 DIAGNOSIS — G4700 Insomnia, unspecified: Secondary | ICD-10-CM | POA: Diagnosis not present

## 2016-11-22 DIAGNOSIS — Z853 Personal history of malignant neoplasm of breast: Secondary | ICD-10-CM

## 2016-11-22 DIAGNOSIS — Z5112 Encounter for antineoplastic immunotherapy: Secondary | ICD-10-CM | POA: Diagnosis not present

## 2016-11-22 LAB — CBC WITH DIFFERENTIAL/PLATELET
BASO%: 0.9 % (ref 0.0–2.0)
BASOS ABS: 0 10*3/uL (ref 0.0–0.1)
EOS%: 1.7 % (ref 0.0–7.0)
Eosinophils Absolute: 0.1 10*3/uL (ref 0.0–0.5)
HEMATOCRIT: 36.5 % (ref 34.8–46.6)
HEMOGLOBIN: 12.1 g/dL (ref 11.6–15.9)
LYMPH#: 1.1 10*3/uL (ref 0.9–3.3)
LYMPH%: 21.5 % (ref 14.0–49.7)
MCH: 30.7 pg (ref 25.1–34.0)
MCHC: 33.1 g/dL (ref 31.5–36.0)
MCV: 92.6 fL (ref 79.5–101.0)
MONO#: 0.5 10*3/uL (ref 0.1–0.9)
MONO%: 9.4 % (ref 0.0–14.0)
NEUT#: 3.3 10*3/uL (ref 1.5–6.5)
NEUT%: 66.5 % (ref 38.4–76.8)
Platelets: 295 10*3/uL (ref 145–400)
RBC: 3.94 10*6/uL (ref 3.70–5.45)
RDW: 13.7 % (ref 11.2–14.5)
WBC: 4.9 10*3/uL (ref 3.9–10.3)

## 2016-11-22 LAB — COMPREHENSIVE METABOLIC PANEL
ALBUMIN: 3.9 g/dL (ref 3.5–5.0)
ALK PHOS: 71 U/L (ref 40–150)
ALT: 15 U/L (ref 0–55)
AST: 17 U/L (ref 5–34)
Anion Gap: 10 mEq/L (ref 3–11)
BUN: 7.9 mg/dL (ref 7.0–26.0)
CALCIUM: 9.7 mg/dL (ref 8.4–10.4)
CO2: 24 mEq/L (ref 22–29)
CREATININE: 0.7 mg/dL (ref 0.6–1.1)
Chloride: 106 mEq/L (ref 98–109)
EGFR: >90 mL/min/{1.73_m2} (ref >90)
Glucose: 88 mg/dl (ref 70–140)
Potassium: 3.8 mEq/L (ref 3.5–5.1)
Sodium: 140 mEq/L (ref 136–145)
TOTAL PROTEIN: 7.8 g/dL (ref 6.4–8.3)
Total Bilirubin: 0.72 mg/dL (ref 0.20–1.20)

## 2016-11-22 MED ORDER — DIPHENHYDRAMINE HCL 25 MG PO CAPS
50.0000 mg | ORAL_CAPSULE | Freq: Once | ORAL | Status: AC
  Administered 2016-11-22: 50 mg via ORAL

## 2016-11-22 MED ORDER — SODIUM CHLORIDE 0.9% FLUSH
10.0000 mL | INTRAVENOUS | Status: DC | PRN
  Administered 2016-11-22: 10 mL
  Filled 2016-11-22: qty 10

## 2016-11-22 MED ORDER — ACETAMINOPHEN 325 MG PO TABS
ORAL_TABLET | ORAL | Status: AC
  Filled 2016-11-22: qty 2

## 2016-11-22 MED ORDER — SODIUM CHLORIDE 0.9 % IV SOLN
Freq: Once | INTRAVENOUS | Status: AC
  Administered 2016-11-22: 13:00:00 via INTRAVENOUS

## 2016-11-22 MED ORDER — SODIUM CHLORIDE 0.9% FLUSH
10.0000 mL | INTRAVENOUS | Status: DC | PRN
  Administered 2016-11-22: 10 mL via INTRAVENOUS
  Filled 2016-11-22: qty 10

## 2016-11-22 MED ORDER — DIPHENHYDRAMINE HCL 25 MG PO CAPS
ORAL_CAPSULE | ORAL | Status: AC
  Filled 2016-11-22: qty 2

## 2016-11-22 MED ORDER — TRASTUZUMAB CHEMO 150 MG IV SOLR
6.0000 mg/kg | Freq: Once | INTRAVENOUS | Status: AC
  Administered 2016-11-22: 378 mg via INTRAVENOUS
  Filled 2016-11-22: qty 18

## 2016-11-22 MED ORDER — HEPARIN SOD (PORK) LOCK FLUSH 100 UNIT/ML IV SOLN
500.0000 [IU] | Freq: Once | INTRAVENOUS | Status: AC
  Administered 2016-11-22: 500 [IU] via INTRAVENOUS
  Filled 2016-11-22: qty 5

## 2016-11-22 MED ORDER — HEPARIN SOD (PORK) LOCK FLUSH 100 UNIT/ML IV SOLN
500.0000 [IU] | Freq: Once | INTRAVENOUS | Status: AC | PRN
  Administered 2016-11-22: 500 [IU]
  Filled 2016-11-22: qty 5

## 2016-11-22 MED ORDER — ACETAMINOPHEN 325 MG PO TABS
650.0000 mg | ORAL_TABLET | Freq: Once | ORAL | Status: AC
  Administered 2016-11-22: 650 mg via ORAL

## 2016-11-22 NOTE — Telephone Encounter (Signed)
Appointments scheduled per 12/13 LOS. Patient had to leave per she was called back to the infusion room. The patient plans to pick up her calendars and reports from the infusion room.

## 2016-11-22 NOTE — Patient Instructions (Signed)
[facility] Discharge Instructions for Patients Receiving Chemotherapy  Today you received the following chemotherapy agents: Herceptin   To help prevent nausea and vomiting after your treatment, we encourage you to take your nausea medication as directed.    If you develop nausea and vomiting that is not controlled by your nausea medication, call the clinic.   BELOW ARE SYMPTOMS THAT SHOULD BE REPORTED IMMEDIATELY:  *FEVER GREATER THAN 100.5 F  *CHILLS WITH OR WITHOUT FEVER  NAUSEA AND VOMITING THAT IS NOT CONTROLLED WITH YOUR NAUSEA MEDICATION  *UNUSUAL SHORTNESS OF BREATH  *UNUSUAL BRUISING OR BLEEDING  TENDERNESS IN MOUTH AND THROAT WITH OR WITHOUT PRESENCE OF ULCERS  *URINARY PROBLEMS  *BOWEL PROBLEMS  UNUSUAL RASH Items with * indicate a potential emergency and should be followed up as soon as possible.  Feel free to call the clinic you have any questions or concerns. The clinic phone number is [phone].  Please show the CHEMO ALERT CARD at check-in to the Emergency Department and triage nurse.   

## 2016-11-22 NOTE — Progress Notes (Signed)
[place]  Telephone:([phone]) [phone] Fax:([phone]) [phone]  Clinic follow Up Note   Patient Care Team: Kristie Cowman, MD as PCP - General (Family Medicine) Stark Klein, MD as Consulting Physician (General Surgery) Truitt Merle, MD as Consulting Physician (Hematology) Arloa Koh, MD as Consulting Physician (Radiation Oncology) Mauro Kaufmann, RN as Registered Nurse Rockwell Germany, RN as Registered Nurse Sylvan Cheese, NP as Nurse Practitioner (Nurse Practitioner) 11/22/2016  CHIEF COMPLAINTS:  Follow up right breast cancer  Oncology History   Breast cancer [facility])   Staging form: Breast, AJCC 7th Edition     Pathologic stage from 11/25/2015: Stage IIA (T1c, N1a, cM0) - Unsigned     Pathologic stage from 11/25/2015: Stage IIA (T1c, N1a, cM0) - Unsigned Breast cancer of upper-outer quadrant of right female breast [facility])   Staging form: Breast, AJCC 7th Edition     Clinical stage from 09/15/2015: Stage IA (T1b, N0, M0) - Signed by Truitt Merle, MD on 12/16/2015     Pathologic stage from 11/25/2015: Stage IIA (T1c, N1a, cM0) - Signed by Truitt Merle, MD on 12/16/2015         Breast cancer of upper-outer quadrant of right female breast ([place])   09/07/2015 Mammogram    Diagnostic mammogram and the ultrasound of the right breast showed a 0.8 cm lobulated mass in the right breast 11 to 12:00 position 9 cm from the nipple. No other lesions or adenopathy.      09/08/2015 Initial Diagnosis    Breast cancer of upper-outer quadrant of right female breast ([place])      09/08/2015 Initial Biopsy    Right breast mass at the upper outer quadrant core needle biopsy showed invasive ductal carcinoma, grade 2-3, ductal carcinoma in situ.      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 Left breast ostectomy was negative for malignancy.      01/06/2016 - 04/20/2016 Chemotherapy    adjuvant chemotherapy TCH P, every 3 weeks, for a total of 6 cycles. Herceptin and Perjeta were held for last two cycles due to drop of her EF on echo       05/25/2016 - 07/11/2016 Radiation Therapy    Adjuvant breast radiation      06/08/2016 -  Chemotherapy    She started maintenance Herceptin every 3 weeks, after clearance from cardiology.      08/11/2016 -  Anti-estrogen oral therapy    Exemestane 25 mg daily       HISTORY OF PRESENTING ILLNESS:  Sandra James 50 y.o. female with past medical history of stage I left breast cancer, is here because of newly diagnosed right breast cancer. She presents to our multidisciplinary breast clinic by herself.  The right breast cancer was discovered by screening mammogram. She did not have any palpable mass, or any constitutional symptoms. She was diagnosed with stage I left breast cancer at age of 41, she had lumpectomy, radiation, and adjuvant chemotherapy and 5 years of tamoxifen. She was treated by Dr. Louann Sjogren in [place]. She moved to [facility] about year ago due to job  change. She has been very compliant with annual screening mammogram.  She had hysterectomy and bilateral oophorectomy and sphincterectomy 5 years ago for heavy bleeding.. She has been having hot flashes since then, moderate, but manageable. She is single, lives alone, works for 2 to [place] has to go up children who live in [place].  CURRENT THERAPY:  1. maintenance Herceptin every 3 weeks, until March 2018  2. Exemestane 25 mg daily, started on 08/11/2016  [place] returns for follow-up and herceptin treatment. She is doing well overall. Her headaches has been mild and intermittent, overall better than  before. No other new complaints. She has been tolerating exemestane well overall, mild hot flash, manageable. No significant arthralgia.  MED ICAL HISTORY:  Past Medical History:  Diagnosis Date  . Allergy   . Anxiety   . Breast cancer ([place]) 09/07/15   right breast  . Breast cancer of upper-outer quadrant of right female breast ([place]) 09/09/2015  . [facility] [address])    left brast lumpectomy   . Complication of anesthesia    slow to wake up- BP was low, fainted next day-  . Depression   . Diabetes mellitus without complication ([place])   . Fibroid   . GERD (gastroesophageal reflux disease)   . Infection    UTI  . MVP (mitral valve prolapse)   . Neuromuscular disorder ([place])    sciatic nerve paion left side/leg  . UTI (lower urinary tract infection)     SURGICAL HISTORY: Past Surgical History:  Procedure Laterality Date  . ABDOMINAL HYSTERECTOMY    . BREAST RECONSTRUCTION WITH PLACEMENT OF TISSUE EXPANDER AND FLEX HD (ACELLULAR HYDRATED DERMIS) Bilateral 11/25/2015   Procedure: BILATERAL IMMEDIATE BREAST RECONSTRUCTION WITH PLACEMENT OF TISSUE EXPANDERS AND FLEX HD (ACELLULAR HYDRATED DERMIS);  Surgeon: Wallace Going, DO;  Location: [place];  Service: Plastics;  Laterality: Bilateral;  . BREAST SURGERY  2004   left lumpectomy  . MASTECTOMY W/ SENTINEL NODE BIOPSY Bilateral 11/25/2015   Procedure: BILATERAL SKIN SPARING MASTECTOMIES WITH RIGHT SENTINEL LYMPH NODE BIOPSY;  Surgeon: Stark Klein, MD;  Location: [place];  Service: General;  Laterality: Bilateral;  . OOPHORECTOMY Bilateral   . PORTACATH PLACEMENT Left 12/20/2015   Procedure: INSERTION PORT-A-CATH, left chest;  Surgeon: Stark Klein, MD;  Location: [place];  Service: General;  Laterality: Left;    SOCIAL HISTORY: Social History   Social History  . Marital Status: Single     Spouse Name: N/A  . Number of Children: 2 children, 39 daughter and 22 yo son    . Years of Education: N/A    Occupational History  . She is a Barista rep   Social History Main Topics  . Smoking status: Never Smoker   . Smokeless tobacco: Never Used  . Alcohol Use: Yes     Comment: 2-3/wk  . Drug Use: No  . Sexual Activity: Yes    Birth Control/ Protection: Surgical   Other Topics Concern  . Not on file   Social History Narrative    FAMILY HISTORY: Family History  Problem Relation Age of Onset  . Hypertension Mother   . Stroke Mother   . Alzheimer's disease Mother   . Heart disease Father   . Stroke Father   . Heart attack Father   . Breast cancer Sister 36    double mastectomy  . Other Sister     "stomach tumor that wrapped around reproductive organs"; dx. 83s; required partial hysterectomy  .  Alzheimer's disease Maternal Grandmother   . Alzheimer's disease Paternal Grandmother   . Other Other     had to have lymph nodes removed and radiation; dx. 56s  . Cancer Maternal Aunt     unspecified type; dx. <50  . Colon cancer Maternal Uncle     dx. 43s  . Breast cancer Cousin     dx. 27s    ALLERGIES:  is allergic to lisinopril; oxycodone; and percocet [oxycodone-acetaminophen].  MEDICATIONS:  Current Outpatient Prescriptions  Medication Sig Dispense Refill  . bisoprolol (ZEBETA) 5 MG tablet Take 0.5 tablets (2.5 mg total) by mouth at bedtime. 15 tablet 3  . exemestane (AROMASIN) 25 MG tablet Take 1 tablet (25 mg total) by mouth daily after breakfast. 30 tablet 1  . fluticasone (FLONASE) 50 MCG/ACT nasal spray Place 1 spray into both nostrils daily as needed for allergies.   0  . ibuprofen (ADVIL,MOTRIN) 800 MG tablet Take 800 mg by mouth daily as needed for headache or moderate pain.    . Iron-Vitamins (GERITOL) LIQD Take 30 mLs by mouth daily. Energy support    . losartan (COZAAR) 25 MG tablet Take 0.5 tablets (12.5 mg total) by mouth daily. 90 tablet 3  . traMADol (ULTRAM) 50 MG tablet Take 1 tablet (50 mg total) by mouth every 6 (six) hours as needed. 50 tablet 0  .  venlafaxine XR (EFFEXOR XR) 37.5 MG 24 hr capsule Take 1 capsule (37.5 mg total) by mouth daily with breakfast. 45 capsule 0  . zolpidem (AMBIEN) 10 MG tablet Take 1 tablet (10 mg total) by mouth at bedtime as needed for sleep. 30 tablet 2  . gabapentin (NEURONTIN) 100 MG capsule   1   No current facility-administered medications for this visit.     REVIEW OF SYSTEMS:   Constitutional: Denies fevers, chills or abnormal night sweats Eyes: Denies blurriness of vision, double vision or watery eyes Ears, nose, mouth, throat, and face: Denies mucositis or sore throat Respiratory: Denies cough, dyspnea or wheezes Cardiovascular: Denies palpitation, chest discomfort or lower extremity swelling Gastrointestinal:  Denies nausea, heartburn or change in bowel habits Skin: Denies abnormal skin rashes Lymphatics: Denies new lymphadenopathy or easy bruising Neurological:Denies numbness, tingling or new weaknesses Behavioral/Psych: Mood is stable, no new changes  All other systems were reviewed with the patient and are negative.  PHYSICAL EXAMINATION: ECOG PERFORMANCE STATUS: 1 BP 104/69 (BP Location: Left Arm, Patient Position: Sitting)   Pulse 74   Temp 98.4 F (36.9 C) (Oral)   Resp 18   Ht 5' 6"  (1.676 m)   Wt 136 lb 6.4 oz (61.9 kg)   SpO2 100%   BMI 22.02 kg/m   GENERAL:alert, no distress and comfortable SKIN: skin color, texture, turgor are normal, no rashes or significant lesions EYES: normal, conjunctiva are pink and non-injected, sclera clear, eye lids appears normal OROPHARYNX:no exudate, no erythema and lips, buccal mucosa, and tongue normal  NECK: supple, thyroid normal size, non-tender, without nodularity LYMPH:  no palpable lymphadenopathy in the cervical, axillary or inguinal LUNGS: clear to auscultation and percussion with normal breathing effort HEART: regular rate & rhythm and no murmurs and no lower extremity edema ABDOMEN:abdomen soft, non-tender and normal bowel  sounds Musculoskeletal:no cyanosis of digits and no clubbing  PSYCH: alert & oriented x 3 with fluent speech NEURO: no focal motor/sensory deficits Breasts: s/p bilateral mastectomy and tissue spender placement. Surgical incision sites are clean and well healed. (+) Diffuse skin pigmentation in the right breast, no  skin ulcers.     LABORATORY DATA:  I have reviewed the data as listed CBC Latest Ref Rng & Units 11/22/2016 10/12/2016 09/21/2016  WBC 3.9 - 10.3 10e3/uL 4.9 4.6 4.8  Hemoglobin 11.6 - 15.9 g/dL 12.1 12.6 11.4(L)  Hematocrit 34.8 - 46.6 % 36.5 37.7 33.8(L)  Platelets 145 - 400 10e3/uL 295 331 237    CMP Latest Ref Rng & Units 11/22/2016 10/12/2016 09/21/2016  Glucose 70 - 140 mg/dl 88 101 101  BUN 7.0 - 26.0 mg/dL 7.9 8.5 8.6  Creatinine 0.6 - 1.1 mg/dL 0.7 0.7 0.7  Sodium 136 - 145 mEq/L 140 143 140  Potassium 3.5 - 5.1 mEq/L 3.8 3.5 3.6  Chloride 101 - 111 mmol/L - - -  CO2 22 - 29 mEq/L 24 25 24   Calcium 8.4 - 10.4 mg/dL 9.7 9.5 9.3  Total Protein 6.4 - 8.3 g/dL 7.8 7.9 7.4  Total Bilirubin 0.20 - 1.20 mg/dL 0.72 0.35 0.78  Alkaline Phos 40 - 150 U/L 71 83 69  AST 5 - 34 U/L 17 18 13   ALT 0 - 55 U/L 15 19 11      Pathology report Diagnosis 11/25/2015 1. Breast, simple mastectomy, right - INVASIVE DUCTAL CARCINOMA, GRADE 2/3, SPANNING 2.0 CM. - DUCTAL CARCINOMA IN SITU, INTERMEDIATE GRADE. - LYMPHOVASCULAR INVASION IS IDENTIFIED. - LOBULAR NEOPLASIA (ATYPICAL LOBULAR HYPERPLASIA). - THE SURGICAL RESECTION MARGINS ARE NEGATIVE FOR CARCINOMA. - SEE ONCOLOGY TABLE BELOW. 2. Lymph node, sentinel, biopsy, right axillary #1 - METASTATIC CARCINOMA IN 1 OF 1 LYMPH NODE (1/1). 3. Lymph node, sentinel, biopsy, right axillary #2 - THERE IS NO EVIDENCE OF CARCINOMA IN 1 OF 1 LYMPH NODE (0/1). 4. Lymph node, sentinel, biopsy, right axillary #3 - THERE IS NO EVIDENCE OF CARCINOMA IN 1 OF 1 LYMPH NODE (0/1). 5. Breast, simple mastectomy, left - BENIGN BREAST PARENCHYMA  WITH DENSE STROMAL FIBROSIS. - FIBROADENOMA. - THERE IS NO EVIDENCE OF MALIGNANCY. 6. Breast, excision, left, additional lateral margin - BENIGN FIBROADIPOSE TISSUE. - THERE IS NO EVIDENCE OF MALIGNANCY. - SEE COMMENT. Microscopic Comment 1. BREAST, INVASIVE TUMOR, WITH LYMPH NODES PRESENT Specimen, including laterality and lymph node sampling (sentinel, non-sentinel): Right breast and right axillary lymph nodes Procedure: Bilateral mastectomy and multiple right axillary lymph node resections Histologic type: Ductal Grade: 2 Tubule formation: 2 1 of 4 FINAL for Keagle, Krysta ([id number]) Microscopic Comment(continued) Nuclear pleomorphism: 2 Mitotic: 2 Tumor size (gross measurement): 2.0 cm Margins: Negative for carcinoma Invasive, distance to closest margin: 1.8 cm to the posterior margin In-situ, distance to closest margin: 1.8 cm to the posterior margin Lymphovascular invasion: Present Ductal carcinoma in situ: Present Grade: Intermediate grade Extensive intraductal component: Not identified Lobular neoplasia: Present, atypical lobular hyperplasia Tumor focality: Unifocal Treatment effect: Not identified Extent of tumor: Confined to breast parenchyma Lymph nodes: Examined: 3 Sentinel 0 Non-sentinel 3 Total Lymph nodes with metastasis: 1 (macrometastasis) - no extracapsular extension is identified. Breast prognostic profile: [id number] Estrogen receptor: 100%, strong staining intensity Progesterone receptor: 100%, strong staining intensity Her 2 neu: Amplification was detected. The ratio was 2.58 Ki-67: 60% TNM: pT1c, pN1a Non-neoplastic breast: No significant findings. 6. The surgical resection margin(s) of the specimen were inked and microscopically evaluated. Enid Cutter MD Pathologist, Electronic Signature (Case signed 11/29/2015)   RADIOGRAPHIC STUDIES: I have personally reviewed the radiological images as listed and agreed with the findings in the  report.  Bone scan 11/13/2016 IMPRESSION: IMPRESSION: 1. No evidence for metastatic disease. 2. Lumbar spine degenerative disc disease.  CT abdomen and pelvis w contrast 11/13/2016 IMPRESSION: 1.8 cm hypervascular lesion in segment 6, unchanged. Given stability, a benign etiology such as FNH or flash filling hemangioma is favored. Metastasis is difficult to exclude but is considered less likely.  1.9 cm lesion in segment 3, compatible with a benign hemangioma.  No findings specific for metastatic disease in the abdomen. Please note that the pelvis was not imaged.   CT head w wo contrast 11/13/2016 IMPRESSION: Negative CT head with contrast.   ECHO 08/02/2016 Impressions:  - Normal LV size with EF 55%. Strain as noted above. Normal RV size   and systolic function.  ASSESSMENT & PLAN:  50 year old African-American female, surgical postmenopausal, history of stage I left breast cancer, with newly diagnosed right breast cancer.  1. Right breast invasive ductal carcinoma, pT1cN1aM0, stage IIB, grade 2, ER100%+/PR100%+/HER2+ -I previously reviewed her surgical pathology results with patient in great details. She has 1 out of 3 sentinel lymph nodes positive, her pathological stage is more advanced than her initial clinical stage.  -We reviewed the natural history of triple positive breast cancer. HER-2 positive tumors are more progressive, with higher risk of recurrence -I reviewed her repeated CT and bone scan images from 11/13/2016 with pt in detail, which are negative for metastatic disease. She has 2 small liver lesions, likely benign, unchanged. Unfortunately she is not able to do liver MRI, due to her breast implants.  -She has completed adjuvant chemotherapy TCHP, Herceptin and pejeta were held for the last 2 cycle due to her decreased EF. -Her repeated echo has showed normal EF, she has restarted Herceptin maintenance therapy. -She  has now completed adjuvant breast and  axilla radiation and tolerated well. -She previously could not tolerate letrozole and it was switched to exemestane, she tolerates it well lately. -She still has moderate hot flash, tolerable. She tried Effexor, did not help. -we'll continue Herceptin maintenance therapy until March 2018   2. Genetics -Due to her young age and recurrent breast cancer, she was referred to see a genetic counselor in our cancer center. -Her genetic test was negative  3. DM -she will continue follow-up with her primary care physician -She is on metformin  4. Bone health -She is postmenopausal by surgery -I encouraged her to take calcium and vitamin D - her bone density scan from December 2017 was normal, I discussed with her   6. Insomnia and low appetite  - improved some, continue mirtazapine -she has not been able to wean off ambien, will continue for now   7. Headaches - probably secondary to her cervical spine degenerative arthritis -Improved after steroids injection -CT head was negative   8. Hot flush -Secondary to menopause and exemestane, improved, manageable- -she tried Effexor, didn't feel helpful  Plan for today  -continue Herceptin today and every 3 weeks, will do lab every 6 weeks  -Continue exemestane. -scan findings reviewed with her -I will see her back in 9 weeks -she is scheduled to see cardiology and repeat echo in January   All questions were answered. The patient knows to call the clinic with any problems, questions or concerns.  I spent 25 minutes counseling the patient face to face. The total time spent in the appointment was 30 minutes and more than 50% was on counseling.     Truitt Merle, MD 11/22/2016

## 2016-11-23 ENCOUNTER — Telehealth: Payer: Self-pay | Admitting: *Deleted

## 2016-11-23 NOTE — Telephone Encounter (Signed)
Per LOS I have scheduled apapts and notified the scheduler 

## 2016-12-06 ENCOUNTER — Other Ambulatory Visit: Payer: Self-pay | Admitting: Hematology

## 2016-12-06 ENCOUNTER — Other Ambulatory Visit (HOSPITAL_COMMUNITY): Payer: Self-pay | Admitting: Cardiology

## 2016-12-06 ENCOUNTER — Other Ambulatory Visit (HOSPITAL_COMMUNITY): Payer: Self-pay | Admitting: Internal Medicine

## 2016-12-06 DIAGNOSIS — T451X5A Adverse effect of antineoplastic and immunosuppressive drugs, initial encounter: Principal | ICD-10-CM

## 2016-12-06 DIAGNOSIS — C50411 Malignant neoplasm of upper-outer quadrant of right female breast: Secondary | ICD-10-CM

## 2016-12-06 DIAGNOSIS — Z170 Estrogen receptor positive status [ER+]: Principal | ICD-10-CM

## 2016-12-06 DIAGNOSIS — I427 Cardiomyopathy due to drug and external agent: Secondary | ICD-10-CM

## 2016-12-06 MED FILL — ZOLPIDEM TARTRATE 10 MG TAB: 10 | 30 days supply | Qty: 30 | Fill #1

## 2016-12-07 ENCOUNTER — Other Ambulatory Visit: Payer: Self-pay | Admitting: *Deleted

## 2016-12-13 ENCOUNTER — Ambulatory Visit (HOSPITAL_COMMUNITY)
Admission: RE | Admit: 2016-12-13 | Discharge: 2016-12-13 | Disposition: A | Discharge status: Home / Self Care | Payer: BLUE CROSS/BLUE SHIELD | Source: Ambulatory Visit | Attending: Nurse Practitioner | Admitting: Nurse Practitioner

## 2016-12-13 ENCOUNTER — Other Ambulatory Visit: Payer: Self-pay | Admitting: Nurse Practitioner

## 2016-12-13 ENCOUNTER — Telehealth: Payer: Self-pay | Admitting: Nurse Practitioner

## 2016-12-13 ENCOUNTER — Ambulatory Visit (HOSPITAL_BASED_OUTPATIENT_CLINIC_OR_DEPARTMENT_OTHER): Payer: BLUE CROSS/BLUE SHIELD

## 2016-12-13 ENCOUNTER — Other Ambulatory Visit: Payer: Self-pay | Admitting: Hematology

## 2016-12-13 ENCOUNTER — Ambulatory Visit (HOSPITAL_BASED_OUTPATIENT_CLINIC_OR_DEPARTMENT_OTHER): Payer: BLUE CROSS/BLUE SHIELD | Admitting: Nurse Practitioner

## 2016-12-13 ENCOUNTER — Encounter: Payer: Self-pay | Admitting: Nurse Practitioner

## 2016-12-13 VITALS — BP 107/68 | HR 71 | Temp 98.3°F | Resp 18

## 2016-12-13 DIAGNOSIS — C50411 Malignant neoplasm of upper-outer quadrant of right female breast: Secondary | ICD-10-CM

## 2016-12-13 DIAGNOSIS — R600 Localized edema: Secondary | ICD-10-CM | POA: Diagnosis not present

## 2016-12-13 DIAGNOSIS — E119 Type 2 diabetes mellitus without complications: Secondary | ICD-10-CM | POA: Diagnosis not present

## 2016-12-13 DIAGNOSIS — Z5112 Encounter for antineoplastic immunotherapy: Secondary | ICD-10-CM | POA: Diagnosis not present

## 2016-12-13 MED ORDER — ACETAMINOPHEN 325 MG PO TABS
ORAL_TABLET | ORAL | Status: AC
Start: 2016-12-13 — End: 2016-12-13
  Filled 2016-12-13: qty 2

## 2016-12-13 MED ORDER — SODIUM CHLORIDE 0.9 % IV SOLN
Freq: Once | INTRAVENOUS | Status: AC
Start: 2016-12-13 — End: 2016-12-13
  Administered 2016-12-13: 10:00:00 via INTRAVENOUS

## 2016-12-13 MED ORDER — SODIUM CHLORIDE 0.9% FLUSH
10.0000 mL | INTRAVENOUS | Status: DC | PRN
  Administered 2016-12-13: 10 mL
  Filled 2016-12-13: qty 10

## 2016-12-13 MED ORDER — DIPHENHYDRAMINE HCL 25 MG PO CAPS
ORAL_CAPSULE | ORAL | Status: AC
  Filled 2016-12-13: qty 2

## 2016-12-13 MED ORDER — DIPHENHYDRAMINE HCL 25 MG PO CAPS
50.0000 mg | ORAL_CAPSULE | Freq: Once | ORAL | Status: AC
  Administered 2016-12-13: 50 mg via ORAL

## 2016-12-13 MED ORDER — ACETAMINOPHEN 325 MG PO TABS
650.0000 mg | ORAL_TABLET | Freq: Once | ORAL | Status: AC
  Administered 2016-12-13: 650 mg via ORAL

## 2016-12-13 MED ORDER — HEPARIN SOD (PORK) LOCK FLUSH 100 UNIT/ML IV SOLN
500.0000 [IU] | Freq: Once | INTRAVENOUS | Status: AC | PRN
  Administered 2016-12-13: 500 [IU]
  Filled 2016-12-13: qty 5

## 2016-12-13 MED ORDER — SODIUM CHLORIDE 0.9 % IV SOLN
6.0000 mg/kg | Freq: Once | INTRAVENOUS | Status: AC
  Administered 2016-12-13: 378 mg via INTRAVENOUS
  Filled 2016-12-13: qty 18

## 2016-12-13 NOTE — Progress Notes (Signed)
SYMPTOM MANAGEMENT CLINIC    Chief Complaint: Right arm edema and axilla pain  HPI:  Sandra James 51 y.o. female diagnosed with breast cancer.  Patient is currently undergoing Herceptin infusions only.   Oncology History   Breast cancer [facility])   Staging form: Breast, AJCC 7th Edition     Pathologic stage from 11/25/2015: Stage IIA (T1c, N1a, cM0) - Unsigned     Pathologic stage from 11/25/2015: Stage IIA (T1c, N1a, cM0) - Unsigned Breast cancer of upper-outer quadrant of right female breast [facility])   Staging form: Breast, AJCC 7th Edition     Clinical stage from 09/15/2015: Stage IA (T1b, N0, M0) - Signed by Truitt Merle, MD on 12/16/2015     Pathologic stage from 11/25/2015: Stage IIA (T1c, N1a, cM0) - Signed by Truitt Merle, MD on 12/16/2015         Breast cancer of upper-outer quadrant of right female breast ([place])   09/07/2015 Mammogram    Diagnostic mammogram and the ultrasound of the right breast showed a 0.8 cm lobulated mass in the right breast 11 to 12:00 position 9 cm from the nipple. No other lesions or adenopathy.      09/08/2015 Initial Diagnosis    Breast cancer of upper-outer quadrant of right female breast ([place])      09/08/2015 Initial Biopsy    Right breast mass at the upper outer quadrant core needle biopsy showed invasive ductal carcinoma, grade 2-3, ductal carcinoma in situ.      09/08/2015 Receptors her2    ER 100% positive, PR 100% positive, Ki-67 60%, HER-2 positive with copy # 4.25 and ratio 2.58      11/25/2015 Surgery    Right breast mastectomy and sentinel lymph node biopsy      11/25/2015 Pathology Results    Right breast invasive ductal carcinoma, grade 2, 2.0 cm, DCIS, intermediate grade, (+) LVI, margins were negative, 1 out of 3 lymph nodes were negative. Left breast ostectomy was negative for malignancy.      01/06/2016 - 04/20/2016 Chemotherapy    adjuvant chemotherapy TCH P, every 3 weeks, for a total of 6 cycles. Herceptin and Perjeta were held for  last two cycles due to drop of her EF on echo       05/25/2016 - 07/11/2016 Radiation Therapy    Adjuvant breast radiation      06/08/2016 -  Chemotherapy    She started maintenance Herceptin every 3 weeks, after clearance from cardiology.      08/11/2016 -  Anti-estrogen oral therapy    Exemestane 25 mg daily       Review of Systems  Musculoskeletal:       Right axilla and right arm pain and mild edema.  All other systems reviewed and are negative.   Past Medical History:  Diagnosis Date  . Allergy   . Anxiety   . Breast cancer ([place]) 09/07/15   right breast  . Breast cancer of upper-outer quadrant of right female breast ([place]) 09/09/2015  . Cancer [facility])    left brast lumpectomy   . Complication of anesthesia    slow to wake up- BP was low, fainted next day-  . Depression   . Diabetes mellitus without complication ([place])   . Fibroid   . GERD (gastroesophageal reflux disease)   . Infection    UTI  . MVP (mitral valve prolapse)   . Neuromuscular disorder ([place])    sciatic nerve paion left side/leg  . UTI (lower urinary  tract infection)     Past Surgical History:  Procedure Laterality Date  . ABDOMINAL HYSTERECTOMY    . BREAST RECONSTRUCTION WITH PLACEMENT OF TISSUE EXPANDER AND FLEX HD (ACELLULAR HYDRATED DERMIS) Bilateral 11/25/2015   Procedure: BILATERAL IMMEDIATE BREAST RECONSTRUCTION WITH PLACEMENT OF TISSUE EXPANDERS AND FLEX HD (ACELLULAR HYDRATED DERMIS);  Surgeon: Wallace Going, DO;  Location: [place];  Service: Plastics;  Laterality: Bilateral;  . BREAST SURGERY  2004   left lumpectomy  . MASTECTOMY W/ SENTINEL NODE BIOPSY Bilateral 11/25/2015   Procedure: BILATERAL SKIN SPARING MASTECTOMIES WITH RIGHT SENTINEL LYMPH NODE BIOPSY;  Surgeon: Stark Klein, MD;  Location: [place];  Service: General;  Laterality: Bilateral;  . OOPHORECTOMY Bilateral   . 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    is allergic to lisinopril; oxycodone; and percocet [oxycodone-acetaminophen].  Allergies as of 12/13/2016      Reactions   Lisinopril Cough   Oxycodone Itching   Percocet [oxycodone-acetaminophen] Itching      Medication List       Accurate as of 12/13/16 11:52 AM. Always use your most recent med list.          bisoprolol 5 MG tablet Commonly known as:  ZEBETA Take 0.5 tablets (2.5 mg total) by mouth at bedtime.   bisoprolol 5 MG tablet Commonly known as:  ZEBETA TAKE 1/2 TABLET BY MOUTH AT BEDTIME.   exemestane 25 MG tablet Commonly known as:  AROMASIN TAKE 1 TABLET BY MOUTH DAILY AFTER BREAKFAST.   fluticasone 50 MCG/ACT nasal spray Commonly known as:  FLONASE Place 1 spray into both nostrils daily as needed for allergies.   gabapentin 100 MG capsule Commonly known as:  NEURONTIN   Geritol Liqd Take 30 mLs by mouth daily. Energy support   ibuprofen 800 MG tablet Commonly known as:  ADVIL,MOTRIN Take 800 mg by mouth daily as needed for headache or moderate pain.   losartan 25 MG tablet Commonly known as:  COZAAR Take 0.5 tablets (12.5 mg total) by mouth daily.   losartan 25 MG tablet Commonly known as:  COZAAR TAKE 1/2 TABLET BY MOUTH DAILY   traMADol 50 MG tablet Commonly known as:  ULTRAM Take 1 tablet (50 mg total) by mouth every 6 (six) hours as needed.   venlafaxine XR 37.5 MG 24 hr capsule Commonly known as:  EFFEXOR XR Take 1 capsule (37.5 mg total) by mouth daily with breakfast.   zolpidem 10 MG tablet Commonly  known as:  AMBIEN Take 1 tablet (10 mg total) by mouth at bedtime as needed for sleep.        PHYSICAL EXAMINATION  Oncology Vitals 12/13/2016 11/22/2016  Height - 168 cm  Weight - 61.871 kg  Weight (lbs) - 136 lbs 6 oz  BMI (kg/m2) - 22.02 kg/m2  Temp 98.3 98.4  Pulse 71 74  Resp 18 18  SpO2 100 100  BSA (m2) - 1.7 m2   BP Readings from Last 2 Encounters:  12/13/16 107/68  11/22/16 104/69    Physical Exam  Constitutional: She  is oriented to person, place, and time and well-developed, well-nourished, and in no distress.  HENT:  Head: Normocephalic and atraumatic.  Eyes: Conjunctivae and EOM are normal. Pupils are equal, round, and reactive to light.  Neck: Normal range of motion.  Pulmonary/Chest: Effort normal. No respiratory distress.  Musculoskeletal: Normal range of motion. She exhibits edema. She exhibits no tenderness.  Questionable trace edema to the right arm.  No masses palpated to the right axilla region.  There is no erythema, warmth, or red streaks.  Also, no tenderness with palpation.  Neurological: She is alert and oriented to person, place, and time.  Skin: Skin is warm and dry.  Psychiatric: Affect normal.  Nursing note and vitals reviewed.   LABORATORY DATA:. No visits with results within 3 Day(s) from this visit.  Latest known visit with results is:  Appointment on 11/22/2016  Component Date Value Ref Range Status  . WBC 11/22/2016 4.9  3.9 - 10.3 10e3/uL Final  . NEUT# 11/22/2016 3.3  1.5 - 6.5 10e3/uL Final  . HGB 11/22/2016 12.1  11.6 - 15.9 g/dL Final  . HCT 11/22/2016 36.5  34.8 - 46.6 % Final  . Platelets 11/22/2016 295  145 - 400 10e3/uL Final  . MCV 11/22/2016 92.6  79.5 - 101.0 fL Final  . MCH 11/22/2016 30.7  25.1 - 34.0 pg Final  . MCHC 11/22/2016 33.1  31.5 - 36.0 g/dL Final  . RBC 11/22/2016 3.94  3.70 - 5.45 10e6/uL Final  . RDW 11/22/2016 13.7  11.2 - 14.5 % Final  . lymph# 11/22/2016 1.1  0.9 - 3.3 10e3/uL Final  . MONO#  11/22/2016 0.5  0.1 - 0.9 10e3/uL Final  . Eosinophils Absolute 11/22/2016 0.1  0.0 - 0.5 10e3/uL Final  . Basophils Absolute 11/22/2016 0.0  0.0 - 0.1 10e3/uL Final  . NEUT% 11/22/2016 66.5  38.4 - 76.8 % Final  . LYMPH% 11/22/2016 21.5  14.0 - 49.7 % Final  . MONO% 11/22/2016 9.4  0.0 - 14.0 % Final  . EOS% 11/22/2016 1.7  0.0 - 7.0 % Final  . BASO% 11/22/2016 0.9  0.0 - 2.0 % Final  . Sodium 11/22/2016 140  136 - 145 mEq/L Final  . Potassium 11/22/2016 3.8  3.5 - 5.1 mEq/L Final  . Chloride 11/22/2016 106  98 - 109 mEq/L Final  . CO2 11/22/2016 24  22 - 29 mEq/L Final  . Glucose 11/22/2016 88  70 - 140 mg/dl Final  . BUN 11/22/2016 7.9  7.0 - 26.0 mg/dL Final  . Creatinine 11/22/2016 0.7  0.6 - 1.1 mg/dL Final  . Total Bilirubin 11/22/2016 0.72  0.20 - 1.20 mg/dL Final  . Alkaline Phosphatase 11/22/2016 71  40 - 150 U/L Final  . AST 11/22/2016 17  5 - 34 U/L Final  . ALT 11/22/2016 15  0 - 55 U/L Final  . Total Protein 11/22/2016 7.8  6.4 - 8.3 g/dL Final  . Albumin 11/22/2016 3.9  3.5 - 5.0 g/dL Final  . Calcium 11/22/2016 9.7  8.4 - 10.4 mg/dL Final  . Anion Gap 11/22/2016 10  3 - 11 mEq/L Final  . EGFR 11/22/2016 >90  >90 ml/min/1.73 m2 Final   Doppler US:  *PRELIMINARY RESULTS* Vascular Ultrasound Right upper extremity venous duplex has been completed.  Preliminary findings: Right upper extremity is negative for deep and superficial vein thrombosis.  Preliminary results called to Retta Mac @ 13:09   RADIOGRAPHIC STUDIES: No results found.  ASSESSMENT/PLAN:    Breast cancer  of upper-outer quadrant of right female breast [facility]) Patient presents to the [place] today to receive her Herceptin infusion.  Patient states that she's been doing very well recently; with exception of some discomfort and mild swelling to her right axilla and right arm.  Patient states that she started using her elliptical machine approximate 2 weeks ago; and has had this discomfort to her  right axilla and right upper arm.  Since that time.  She denies any chest pain, chest pressure, shortness of breath, or pain with inspiration.  Also, patient states she intermittently has some very mild lymphedema that she wears her compression sleeve.  4.  Exam today reveals trace, questionable edema to the right upper extremity.  There was no obvious thought patient of masses or lesions to the right axilla region.  There was also no tenderness with palpation to that area.  Will order a Doppler ultrasound of the right upper extremity to rule out a DVT.  If the Doppler ultrasound is negative-patient was advised to elevate her arm above the level of for heart.  Whenever she is at rest and to try using her compression sleeve again.  Discussion with patient regarding postsurgical changes after mastectomy which include development of scar tissue-which can be uncomfortable when you start an exercise program.  Patient was also advised to call/return to go directly to the emergency department for any worsening symptoms whatsoever.   Patient stated understanding of all instructions; and was in agreement with this plan of care. The patient knows to call the clinic with any problems, questions or concerns.   Total time spent with patient was 40 minutes;  with greater than 75 percent of that time spent in face to face counseling regarding patient's symptoms,  and coordination of care and follow up.  Disclaimer:This dictation was prepared with Dragon/digital dictation along with [employer]. Any transcriptional errors that result from this process are unintentional.  Drue Second, NP 12/13/2016

## 2016-12-13 NOTE — Progress Notes (Signed)
*  PRELIMINARY RESULTS* Vascular Ultrasound Right upper extremity venous duplex has been completed.  Preliminary findings: Right upper extremity is negative for deep and superficial vein thrombosis.  Preliminary results called to [facility] @ 13:09  Everrett Coombe 12/13/2016, 1:08 PM

## 2016-12-13 NOTE — Telephone Encounter (Signed)
Called patient to review her Doppler ultrasound results for her right arm.  There was no evidence of DVT or superficial thrombus to the right arm.  Patient was advised to elevate her arm, use occasional ibuprofen, and use her compression sleeve as previously directed.  She was encouraged to call/return or go directly to the emergency department for any worsening symptoms whatsoever.

## 2016-12-13 NOTE — Patient Instructions (Signed)
[facility] Discharge Instructions for Patients Receiving Chemotherapy  Today you received the following chemotherapy agents: Herceptin   To help prevent nausea and vomiting after your treatment, we encourage you to take your nausea medication as directed.    If you develop nausea and vomiting that is not controlled by your nausea medication, call the clinic.   BELOW ARE SYMPTOMS THAT SHOULD BE REPORTED IMMEDIATELY:  *FEVER GREATER THAN 100.5 F  *CHILLS WITH OR WITHOUT FEVER  NAUSEA AND VOMITING THAT IS NOT CONTROLLED WITH YOUR NAUSEA MEDICATION  *UNUSUAL SHORTNESS OF BREATH  *UNUSUAL BRUISING OR BLEEDING  TENDERNESS IN MOUTH AND THROAT WITH OR WITHOUT PRESENCE OF ULCERS  *URINARY PROBLEMS  *BOWEL PROBLEMS  UNUSUAL RASH Items with * indicate a potential emergency and should be followed up as soon as possible.  Feel free to call the clinic you have any questions or concerns. The clinic phone number is [phone].  Please show the CHEMO ALERT CARD at check-in to the Emergency Department and triage nurse.   

## 2016-12-13 NOTE — Assessment & Plan Note (Signed)
Patient presents to the [place] today to receive her Herceptin infusion.  Patient states that she's been doing very well recently; with exception of some discomfort and mild swelling to her right axilla and right arm.  Patient states that she started using her elliptical machine approximate 2 weeks ago; and has had this discomfort to her right axilla and right upper arm.  Since that time.  She denies any chest pain, chest pressure, shortness of breath, or pain with inspiration.  Also, patient states she intermittently has some very mild lymphedema that she wears her compression sleeve.  4.  Exam today reveals trace, questionable edema to the right upper extremity.  There was no obvious thought patient of masses or lesions to the right axilla region.  There was also no tenderness with palpation to that area.  Will order a Doppler ultrasound of the right upper extremity to rule out a DVT.  If the Doppler ultrasound is negative-patient was advised to elevate her arm above the level of for heart.  Whenever she is at rest and to try using her compression sleeve again.  Discussion with patient regarding postsurgical changes after mastectomy which include development of scar tissue-which can be uncomfortable when you start an exercise program.  Patient was also advised to call/return to go directly to the emergency department for any worsening symptoms whatsoever.

## 2017-01-01 ENCOUNTER — Telehealth: Payer: Self-pay

## 2017-01-01 ENCOUNTER — Other Ambulatory Visit (HOSPITAL_COMMUNITY): Payer: Self-pay | Admitting: *Deleted

## 2017-01-01 DIAGNOSIS — C50412 Malignant neoplasm of upper-outer quadrant of left female breast: Secondary | ICD-10-CM

## 2017-01-01 NOTE — Telephone Encounter (Signed)
Pt called stated she is feeling really bad.  Called back, she feels no energy and really dragging. Barely able to get out of bed today. Feeling this was for last 2-3 days. No SOB, no fever, no heart racing or pounding, no GI issues, no GU issues, no swelling of hands or feet and R arm is improved from 12/13/16. Next herceptin due 1/24 Next echo due 1/29. Can leave message on phone.   S/w Dr Burr Medico. She requested pt call Dr Mahalia Longest to see if she can get echo sooner. We will postpone herceptin to after echo is done. Pt is aware and calling Dr Mahalia Longest right away. She will let us know when the echo is scheduled to adjust herceptin appt.

## 2017-01-01 NOTE — Telephone Encounter (Signed)
Noted echo has been changed to 1/23 at 11 am.

## 2017-01-02 ENCOUNTER — Ambulatory Visit (HOSPITAL_COMMUNITY)
Admission: RE | Admit: 2017-01-02 | Discharge: 2017-01-02 | Disposition: A | Discharge status: Home / Self Care | Payer: BLUE CROSS/BLUE SHIELD | Source: Ambulatory Visit | Attending: Family Medicine | Admitting: Family Medicine

## 2017-01-02 DIAGNOSIS — I517 Cardiomegaly: Secondary | ICD-10-CM | POA: Diagnosis not present

## 2017-01-02 DIAGNOSIS — E119 Type 2 diabetes mellitus without complications: Secondary | ICD-10-CM | POA: Insufficient documentation

## 2017-01-02 DIAGNOSIS — Z09 Encounter for follow-up examination after completed treatment for conditions other than malignant neoplasm: Secondary | ICD-10-CM | POA: Diagnosis present

## 2017-01-02 DIAGNOSIS — C50412 Malignant neoplasm of upper-outer quadrant of left female breast: Secondary | ICD-10-CM | POA: Insufficient documentation

## 2017-01-03 ENCOUNTER — Ambulatory Visit (HOSPITAL_BASED_OUTPATIENT_CLINIC_OR_DEPARTMENT_OTHER): Payer: BLUE CROSS/BLUE SHIELD

## 2017-01-03 ENCOUNTER — Other Ambulatory Visit: Payer: Self-pay | Admitting: *Deleted

## 2017-01-03 ENCOUNTER — Encounter: Payer: Self-pay | Admitting: Hematology

## 2017-01-03 ENCOUNTER — Ambulatory Visit (HOSPITAL_BASED_OUTPATIENT_CLINIC_OR_DEPARTMENT_OTHER): Payer: BLUE CROSS/BLUE SHIELD | Admitting: Hematology

## 2017-01-03 ENCOUNTER — Other Ambulatory Visit (HOSPITAL_BASED_OUTPATIENT_CLINIC_OR_DEPARTMENT_OTHER): Payer: BLUE CROSS/BLUE SHIELD

## 2017-01-03 VITALS — BP 115/68 | HR 78 | Temp 98.2°F | Resp 18

## 2017-01-03 DIAGNOSIS — C50411 Malignant neoplasm of upper-outer quadrant of right female breast: Secondary | ICD-10-CM

## 2017-01-03 DIAGNOSIS — Z170 Estrogen receptor positive status [ER+]: Principal | ICD-10-CM

## 2017-01-03 DIAGNOSIS — R51 Headache: Secondary | ICD-10-CM

## 2017-01-03 DIAGNOSIS — E119 Type 2 diabetes mellitus without complications: Secondary | ICD-10-CM

## 2017-01-03 DIAGNOSIS — Z5112 Encounter for antineoplastic immunotherapy: Secondary | ICD-10-CM

## 2017-01-03 LAB — CBC WITH DIFFERENTIAL/PLATELET
BASO%: 0.7 % (ref 0.0–2.0)
BASOS ABS: 0 10*3/uL (ref 0.0–0.1)
EOS%: 2 % (ref 0.0–7.0)
Eosinophils Absolute: 0.1 10*3/uL (ref 0.0–0.5)
HCT: 34.3 % — ABNORMAL LOW (ref 34.8–46.6)
HGB: 11.7 g/dL (ref 11.6–15.9)
LYMPH%: 21.2 % (ref 14.0–49.7)
MCH: 31.3 pg (ref 25.1–34.0)
MCHC: 34.1 g/dL (ref 31.5–36.0)
MCV: 91.9 fL (ref 79.5–101.0)
MONO#: 0.3 10*3/uL (ref 0.1–0.9)
MONO%: 7 % (ref 0.0–14.0)
NEUT#: 3.2 10*3/uL (ref 1.5–6.5)
NEUT%: 69.1 % (ref 38.4–76.8)
Platelets: 304 10*3/uL (ref 145–400)
RBC: 3.73 10*6/uL (ref 3.70–5.45)
RDW: 13 % (ref 11.2–14.5)
WBC: 4.6 10*3/uL (ref 3.9–10.3)
lymph#: 1 10*3/uL (ref 0.9–3.3)

## 2017-01-03 LAB — COMPREHENSIVE METABOLIC PANEL
ALBUMIN: 3.8 g/dL (ref 3.5–5.0)
ALK PHOS: 79 U/L (ref 40–150)
ALT: 10 U/L (ref 0–55)
AST: 15 U/L (ref 5–34)
Anion Gap: 11 mEq/L (ref 3–11)
BILIRUBIN TOTAL: 0.98 mg/dL (ref 0.20–1.20)
BUN: 12 mg/dL (ref 7.0–26.0)
CO2: 26 meq/L (ref 22–29)
Calcium: 9.5 mg/dL (ref 8.4–10.4)
Chloride: 103 mEq/L (ref 98–109)
Creatinine: 0.7 mg/dL (ref 0.6–1.1)
EGFR: >90 mL/min/{1.73_m2} (ref >90)
GLUCOSE: 92 mg/dL (ref 70–140)
Potassium: 3.4 mEq/L — ABNORMAL LOW (ref 3.5–5.1)
SODIUM: 140 meq/L (ref 136–145)
TOTAL PROTEIN: 7.4 g/dL (ref 6.4–8.3)

## 2017-01-03 MED ORDER — DIPHENHYDRAMINE HCL 25 MG PO CAPS
ORAL_CAPSULE | ORAL | Status: AC
  Filled 2017-01-03: qty 2

## 2017-01-03 MED ORDER — TRASTUZUMAB CHEMO 150 MG IV SOLR
6.0000 mg/kg | Freq: Once | INTRAVENOUS | Status: AC
  Administered 2017-01-03: 378 mg via INTRAVENOUS
  Filled 2017-01-03: qty 18

## 2017-01-03 MED ORDER — ZOLPIDEM TARTRATE 10 MG PO TABS: 10.0000 mg | ORAL_TABLET | Freq: Every evening | ORAL | 2 refills | Status: DC | PRN

## 2017-01-03 MED ORDER — DIPHENHYDRAMINE HCL 25 MG PO CAPS
50.0000 mg | ORAL_CAPSULE | Freq: Once | ORAL | Status: AC
  Administered 2017-01-03: 50 mg via ORAL

## 2017-01-03 MED ORDER — ACETAMINOPHEN 325 MG PO TABS
ORAL_TABLET | ORAL | Status: AC
  Filled 2017-01-03: qty 2

## 2017-01-03 MED ORDER — HEPARIN SOD (PORK) LOCK FLUSH 100 UNIT/ML IV SOLN
500.0000 [IU] | Freq: Once | INTRAVENOUS | Status: AC | PRN
  Administered 2017-01-03: 500 [IU]
  Filled 2017-01-03: qty 5

## 2017-01-03 MED ORDER — ACETAMINOPHEN 325 MG PO TABS
650.0000 mg | ORAL_TABLET | Freq: Once | ORAL | Status: AC
  Administered 2017-01-03: 650 mg via ORAL

## 2017-01-03 MED ORDER — EXEMESTANE 25 MG PO TABS: ORAL_TABLET | ORAL | 1 refills | Status: DC

## 2017-01-03 MED ORDER — SODIUM CHLORIDE 0.9 % IV SOLN
Freq: Once | INTRAVENOUS | Status: AC
  Administered 2017-01-03: 11:00:00 via INTRAVENOUS

## 2017-01-03 MED ORDER — SODIUM CHLORIDE 0.9% FLUSH
10.0000 mL | INTRAVENOUS | Status: DC | PRN
  Administered 2017-01-03: 10 mL
  Filled 2017-01-03: qty 10

## 2017-01-03 MED FILL — ZOLPIDEM TARTRATE 10 MG TAB: 10 | 30 days supply | Qty: 30 | Fill #2

## 2017-01-03 MED FILL — EXEMESTANE 25 MG TABLET: 25 | 30 days supply | Qty: 30 | Fill #0

## 2017-01-03 NOTE — Progress Notes (Signed)
[place]  [phone] [phone]  Clinic follow Up Note   Patient Care Team: Sandra Cowman, MD as PCP - General (Family Medicine) Sandra Klein, MD as Consulting Physician (General Surgery) Sandra Merle, MD as Consulting Physician (Hematology) Sandra Koh, MD as Consulting Physician (Radiation Oncology) Sandra Kaufmann, RN as Registered Nurse Sandra Germany, RN as Registered Nurse Sandra Cheese, NP as Nurse Practitioner (Nurse Practitioner) 01/03/2017  CHIEF COMPLAINTS:  Follow up right breast cancer  Oncology History   Breast cancer [facility])   Staging form: Breast, AJCC 7th Edition     Pathologic stage from 11/25/2015: Stage IIA (T1c, N1a, cM0) - Unsigned     Pathologic stage from 11/25/2015: Stage IIA (T1c, N1a, cM0) - Unsigned Breast cancer of upper-outer quadrant of right female breast [facility])   Staging form: Breast, AJCC 7th Edition     Clinical stage from 09/15/2015: Stage IA (T1b, N0, M0) - Signed by Sandra Merle, MD on 12/16/2015     Pathologic stage from 11/25/2015: Stage IIA (T1c, N1a, cM0) - Signed by Sandra Merle, MD on 12/16/2015         Breast cancer of upper-outer quadrant of right female breast ([place])   09/07/2015 Mammogram    Diagnostic mammogram and the ultrasound of the right breast showed a 0.8 cm lobulated mass in the right breast 11 to 12:00 position 9 cm from the nipple. No other lesions or adenopathy.      09/08/2015 Initial Diagnosis    Breast cancer of upper-outer quadrant of right female breast ([place])      09/08/2015 Initial Biopsy    Right breast mass at the upper outer quadrant core needle biopsy showed invasive ductal carcinoma, grade 2-3, ductal carcinoma in situ.      09/08/2015 Receptors her2    James 100% positive, PR 100% positive, Ki-67 60%, HER-2 positive with copy # 4.25 and ratio 2.58      11/25/2015 Surgery    Right breast mastectomy and sentinel lymph node biopsy      11/25/2015 Pathology Results   Right breast invasive ductal carcinoma, grade 2, 2.0 cm, DCIS, intermediate grade, (+) LVI, margins were negative, 1 out of 3 lymph nodes were negative. Left breast ostectomy was negative for malignancy.      01/06/2016 - 04/20/2016 Chemotherapy    adjuvant chemotherapy TCH P, every 3 weeks, for a total of 6 cycles. Herceptin and Perjeta were held for last two cycles due to drop of her EF on echo       05/25/2016 - 07/11/2016 Radiation Therapy    Adjuvant breast radiation      06/08/2016 -  Chemotherapy    She started maintenance Herceptin every 3 weeks, after clearance from cardiology.      08/11/2016 -  Anti-estrogen oral therapy    Exemestane 25 mg daily       HISTORY OF PRESENTING ILLNESS:  Sandra James 51 y.o. female with past medical history of stage I left breast cancer, is here because of newly diagnosed right breast cancer. She presents to our multidisciplinary breast clinic by herself.  The right breast cancer was discovered by screening mammogram. She did not have any palpable mass, or any constitutional symptoms. She was diagnosed with stage I left breast cancer at age of 75, she had lumpectomy, radiation, and adjuvant chemotherapy and 5 years of tamoxifen. She was treated by Dr. Louann James in [place]. She moved to [facility] about year ago due to job  change. She has been very compliant with annual screening mammogram.  She had hysterectomy and bilateral oophorectomy and sphincterectomy 5 years ago for heavy bleeding.. She has been having hot flashes since then, moderate, but manageable. She is single, lives alone, works for 2 to [place] has to go up children who live in [place].  CURRENT THERAPY:  1. maintenance Herceptin every 3 weeks, until March 2018  2. Exemestane 25 mg daily, started on 08/11/2016  [name] returns for follow-up and herceptin treatment. She reports she had no energy one day last week. This fatigue resolved on its own. She denies  cold or fever. She reports she has been running, but has no additional muscle aching. She feels better today, and felt really well yesterday. She had a panic attack last month, but has learned to relax and manage it. She has gotten used to the hot flashes, secondary to exemestane. She reports they have had issues accessing her port lately.   MED ICAL HISTORY:  Past Medical History:  Diagnosis Date  . Allergy   . Anxiety   . Breast cancer ([place]) 09/07/15   right breast  . Breast cancer of upper-outer quadrant of right female breast ([place]) 09/09/2015  . Cancer [facility])    left brast lumpectomy   . Complication of anesthesia    slow to wake up- BP was low, fainted next day-  . Depression   . Diabetes mellitus without complication ([place])   . Fibroid   . GERD (gastroesophageal reflux disease)   . Infection    UTI  . MVP (mitral valve prolapse)   . Neuromuscular disorder ([place])    sciatic nerve paion left side/leg  . UTI (lower urinary tract infection)     SURGICAL HISTORY: Past Surgical History:  Procedure Laterality Date  . ABDOMINAL HYSTERECTOMY    . BREAST RECONSTRUCTION WITH PLACEMENT OF TISSUE EXPANDER AND FLEX HD (ACELLULAR HYDRATED DERMIS) Bilateral 11/25/2015   Procedure: BILATERAL IMMEDIATE BREAST RECONSTRUCTION WITH PLACEMENT OF TISSUE EXPANDERS AND FLEX HD (ACELLULAR HYDRATED DERMIS);  Surgeon: Sandra Going, DO;  Location: [place];  Service: Plastics;  Laterality: Bilateral;  . BREAST SURGERY  2004   left lumpectomy  . MASTECTOMY W/ SENTINEL NODE BIOPSY Bilateral 11/25/2015   Procedure: BILATERAL SKIN SPARING MASTECTOMIES WITH RIGHT SENTINEL LYMPH NODE BIOPSY;  Surgeon: Sandra Klein, MD;  Location: [place];  Service: General;  Laterality: Bilateral;  . OOPHORECTOMY Bilateral   . PORTACATH PLACEMENT Left 12/20/2015   Procedure: INSERTION PORT-A-CATH, left chest;  Surgeon: Sandra Klein, MD;  Location: [place];  Service: General;  Laterality:  Left;    SOCIAL HISTORY: Social History   Social History  . Marital Status: Single     Spouse Name: N/A  . Number of Children: 2 children, 83 daughter and 69 yo son    . Years of Education: N/A   Occupational History  . She is a Barista rep   Social History Main Topics  . Smoking status: Never Smoker   . Smokeless tobacco: Never Used  . Alcohol Use: Yes     Comment: 2-3/wk  . Drug Use: No  . Sexual Activity: Yes    Birth Control/ Protection: Surgical   Other Topics Concern  . Not on file   Social History Narrative    FAMILY HISTORY: Family History  Problem Relation Age of Onset  . Hypertension Mother   . Stroke Mother   . Alzheimer's disease Mother   . Heart  disease Father   . Stroke Father   . Heart attack Father   . Breast cancer Sister 50    double mastectomy  . Other Sister     "stomach tumor that wrapped around reproductive organs"; dx. 26s; required partial hysterectomy  . Alzheimer's disease Maternal Grandmother   . Alzheimer's disease Paternal Grandmother   . Other Other     had to have lymph nodes removed and radiation; dx. 70s  . Cancer Maternal Aunt     unspecified type; dx. <50  . Colon cancer Maternal Uncle     dx. 85s  . Breast cancer Cousin     dx. 11s    ALLERGIES:  is allergic to lisinopril; oxycodone; and percocet [oxycodone-acetaminophen].  MEDICATIONS:  Current Outpatient Prescriptions  Medication Sig Dispense Refill  . bisoprolol (ZEBETA) 5 MG tablet Take 0.5 tablets (2.5 mg total) by mouth at bedtime. 15 tablet 3  . bisoprolol (ZEBETA) 5 MG tablet TAKE 1/2 TABLET BY MOUTH AT BEDTIME. 15 tablet 3  . exemestane (AROMASIN) 25 MG tablet TAKE 1 TABLET BY MOUTH DAILY AFTER BREAKFAST. 30 tablet 1  . fluticasone (FLONASE) 50 MCG/ACT nasal spray Place 1 spray into both nostrils daily as needed for allergies.   0  . gabapentin (NEURONTIN) 100 MG capsule   1  . ibuprofen (ADVIL,MOTRIN) 800 MG tablet Take 800 mg by mouth daily as needed for  headache or moderate pain.    . Iron-Vitamins (GERITOL) LIQD Take 30 mLs by mouth daily. Energy support    . losartan (COZAAR) 25 MG tablet Take 0.5 tablets (12.5 mg total) by mouth daily. 90 tablet 3  . losartan (COZAAR) 25 MG tablet TAKE 1/2 TABLET BY MOUTH DAILY 90 tablet 3  . traMADol (ULTRAM) 50 MG tablet Take 1 tablet (50 mg total) by mouth every 6 (six) hours as needed. 50 tablet 0  . venlafaxine XR (EFFEXOR XR) 37.5 MG 24 hr capsule Take 1 capsule (37.5 mg total) by mouth daily with breakfast. 45 capsule 0  . zolpidem (AMBIEN) 10 MG tablet Take 1 tablet (10 mg total) by mouth at bedtime as needed for sleep. 30 tablet 2   No current facility-administered medications for this visit.     REVIEW OF SYSTEMS:   Constitutional: Denies fevers, chills or abnormal night sweats (+) fatigue for one day last week, improved with time Eyes: Denies blurriness of vision, double vision or watery eyes Ears, nose, mouth, throat, and face: Denies mucositis or sore throat Respiratory: Denies cough, dyspnea or wheezes Cardiovascular: Denies palpitation, chest discomfort or lower extremity swelling Gastrointestinal:  Denies nausea, heartburn or change in bowel habits Skin: Denies abnormal skin rashes Lymphatics: Denies new lymphadenopathy or easy bruising Neurological:Denies numbness, tingling or new weaknesses Behavioral/Psych: Mood is stable, no new changes  All other systems were reviewed with the patient and are negative.  PHYSICAL EXAMINATION: ECOG PERFORMANCE STATUS: 1 There were no vitals taken for this visit.  Vitals with BMI 01/03/2017  Height   Weight   BMI   Systolic 197  Diastolic 68  Pulse 78  Respirations 18   GENERAL:alert, no distress and comfortable SKIN: skin color, texture, turgor are normal, no rashes or significant lesions EYES: normal, conjunctiva are pink and non-injected, sclera clear, eye lids appears normal OROPHARYNX:no exudate, no erythema and lips, buccal mucosa,  and tongue normal  NECK: supple, thyroid normal size, non-tender, without nodularity LYMPH:  no palpable lymphadenopathy in the cervical, axillary or inguinal LUNGS: clear to auscultation and percussion  with normal breathing effort HEART: regular rate & rhythm and no murmurs and no lower extremity edema ABDOMEN:abdomen soft, non-tender and normal bowel sounds Musculoskeletal:no cyanosis of digits and no clubbing  PSYCH: alert & oriented x 3 with fluent speech NEURO: no focal motor/sensory deficits Breasts: s/p bilateral mastectomy and tissue spender placement. Surgical incision sites are clean and well healed.     LABORATORY DATA:  I have reviewed the data as listed CBC Latest Ref Rng & Units 01/03/2017 11/22/2016 10/12/2016  WBC 3.9 - 10.3 10e3/uL 4.6 4.9 4.6  Hemoglobin 11.6 - 15.9 g/dL 11.7 12.1 12.6  Hematocrit 34.8 - 46.6 % 34.3(L) 36.5 37.7  Platelets 145 - 400 10e3/uL 304 295 331    CMP Latest Ref Rng & Units 01/03/2017 11/22/2016 10/12/2016  Glucose 70 - 140 mg/dl 92 88 101  BUN 7.0 - 26.0 mg/dL 12.0 7.9 8.5  Creatinine 0.6 - 1.1 mg/dL 0.7 0.7 0.7  Sodium 136 - 145 mEq/L 140 140 143  Potassium 3.5 - 5.1 mEq/L 3.4(L) 3.8 3.5  Chloride 101 - 111 mmol/L - - -  CO2 22 - 29 mEq/L 26 24 25   Calcium 8.4 - 10.4 mg/dL 9.5 9.7 9.5  Total Protein 6.4 - 8.3 g/dL 7.4 7.8 7.9  Total Bilirubin 0.20 - 1.20 mg/dL 0.98 0.72 0.35  Alkaline Phos 40 - 150 U/L 79 71 83  AST 5 - 34 U/L 15 17 18   ALT 0 - 55 U/L 10 15 19      Pathology report Diagnosis 11/25/2015 1. Breast, simple mastectomy, right - INVASIVE DUCTAL CARCINOMA, GRADE 2/3, SPANNING 2.0 CM. - DUCTAL CARCINOMA IN SITU, INTERMEDIATE GRADE. - LYMPHOVASCULAR INVASION IS IDENTIFIED. - LOBULAR NEOPLASIA (ATYPICAL LOBULAR HYPERPLASIA). - THE SURGICAL RESECTION MARGINS ARE NEGATIVE FOR CARCINOMA. - SEE ONCOLOGY TABLE BELOW. 2. Lymph node, sentinel, biopsy, right axillary #1 - METASTATIC CARCINOMA IN 1 OF 1 LYMPH NODE (1/1). 3. Lymph  node, sentinel, biopsy, right axillary #2 - THERE IS NO EVIDENCE OF CARCINOMA IN 1 OF 1 LYMPH NODE (0/1). 4. Lymph node, sentinel, biopsy, right axillary #3 - THERE IS NO EVIDENCE OF CARCINOMA IN 1 OF 1 LYMPH NODE (0/1). 5. Breast, simple mastectomy, left - BENIGN BREAST PARENCHYMA WITH DENSE STROMAL FIBROSIS. - FIBROADENOMA. - THERE IS NO EVIDENCE OF MALIGNANCY. 6. Breast, excision, left, additional lateral margin - BENIGN FIBROADIPOSE TISSUE. - THERE IS NO EVIDENCE OF MALIGNANCY. - SEE COMMENT. Microscopic Comment 1. BREAST, INVASIVE TUMOR, WITH LYMPH NODES PRESENT Specimen, including laterality and lymph node sampling (sentinel, non-sentinel): Right breast and right axillary lymph nodes Procedure: Bilateral mastectomy and multiple right axillary lymph node resections Histologic type: Ductal Grade: 2 Tubule formation: 2 1 of 4 FINAL for Blixt, [place] ([id number]) Microscopic Comment(continued) Nuclear pleomorphism: 2 Mitotic: 2 Tumor size (gross measurement): 2.0 cm Margins: Negative for carcinoma Invasive, distance to closest margin: 1.8 cm to the posterior margin In-situ, distance to closest margin: 1.8 cm to the posterior margin Lymphovascular invasion: Present Ductal carcinoma in situ: Present Grade: Intermediate grade Extensive intraductal component: Not identified Lobular neoplasia: Present, atypical lobular hyperplasia Tumor focality: Unifocal Treatment effect: Not identified Extent of tumor: Confined to breast parenchyma Lymph nodes: Examined: 3 Sentinel 0 Non-sentinel 3 Total Lymph nodes with metastasis: 1 (macrometastasis) - no extracapsular extension is identified. Breast prognostic profile: [phone] Estrogen receptor: 100%, strong staining intensity Progesterone receptor: 100%, strong staining intensity Her 2 neu: Amplification was detected. The ratio was 2.58 Ki-67: 60% TNM: pT1c, pN1a Non-neoplastic breast: No significant findings. 6. The  surgical resection margin(s) of the specimen were inked and microscopically evaluated. Enid Cutter MD Pathologist, Electronic Signature (Case signed 11/29/2015)   RADIOGRAPHIC STUDIES: I have personally reviewed the radiological images as listed and agreed with the findings in the report.  Echo 01/02/2017 Impressions: - Compared to a prior study in 07/2016, the LVEF is stable however,   LV strain is worse at -15% with inferolateral strain abnormality.  Bone scan 11/13/2016 IMPRESSION: IMPRESSION: 1. No evidence for metastatic disease. 2. Lumbar spine degenerative disc disease.   CT abdomen and pelvis w contrast 11/13/2016 IMPRESSION: 1.8 cm hypervascular lesion in segment 6, unchanged. Given stability, a benign etiology such as FNH or flash filling hemangioma is favored. Metastasis is difficult to exclude but is considered less likely.  1.9 cm lesion in segment 3, compatible with a benign hemangioma.  No findings specific for metastatic disease in the abdomen. Please note that the pelvis was not imaged.   CT head w wo contrast 11/13/2016 IMPRESSION: Negative CT head with contrast.   ECHO 08/02/2016 Impressions: - Normal LV size with EF 55%. Strain as noted above. Normal RV size   and systolic function.  ASSESSMENT & PLAN:  51 year old African-American female, surgical postmenopausal, history of stage I left breast cancer, with newly diagnosed right breast cancer.  1. Right breast invasive ductal carcinoma, pT1cN1aM0, stage IIB, grade 2, ER100%+/PR100%+/HER2+ -I previously reviewed her surgical pathology results with patient in great details. She has 1 out of 3 sentinel lymph nodes positive, her pathological stage is more advanced than her initial clinical stage.  -We reviewed the natural history of triple positive breast cancer. HER-2 positive tumors are more progressive, with higher risk of recurrence -I reviewed her repeated CT and bone scan images from 11/13/2016  with pt in detail, which are negative for metastatic disease. She has 2 small liver lesions, likely benign, unchanged. Unfortunately she is not able to do liver MRI, due to her breast implants.  -She has completed adjuvant chemotherapy TCHP, Herceptin and pejeta were held for the last 2 cycle due to her decreased EF. -Her repeated echo has showed normal EF, she will continue Herceptin maintenance therapy. -She  has completed adjuvant breast and axilla radiation and tolerated well. -She previously could not tolerate letrozole and it was switched to exemestane, she tolerates it well lately, will continue for 5-7 years.  -She still has moderate hot flash, tolerable. She tried Effexor, did not help. -we'll continue Herceptin maintenance therapy until March/April 2018  -I will recommend neratinib, a recently FDA approved oral her2 antibody, after she competes herceptin.  -Continue breast cancer surveillance  2. Genetics -Due to her young age and recurrent breast cancer, she was referred to see a genetic counselor in our cancer center. -Her genetic test was negative  3. DM -she will continue follow-up with her primary care physician -She is on metformin  4. Bone health -She is postmenopausal by surgery -I encouraged her to take calcium and vitamin D - her bone density scan from December 2017 was normal, I discussed with her   6. Insomnia and low appetite  - improved some, continue mirtazapine -she has not been able to wean off ambien, will continue for now   7. Headaches - probably secondary to her cervical spine degenerative arthritis -Improved after steroids injection -CT head was negative   8. Hot flush -Secondary to menopause and exemestane, improved, manageable- -she tried Effexor, didn't feel helpful  Plan for today  -continue Herceptin today and every 3 weeks,  will do lab every 6 weeks  -Continue exemestane. I have refilled today. -I have refilled ambien today -Echocardiogram  reviewed with her -I will see her back in 9 weeks with her last treatment -she is scheduled to see Dr. Haroldine Laws on 01/08/2017   All questions were answered. The patient knows to call the clinic with any problems, questions or concerns.  I spent 25 minutes counseling the patient face to face. The total time spent in the appointment was 30 minutes and more than 50% was on counseling.  This document serves as a record of services personally performed by Sandra Merle, MD. It was created on her behalf by Arlyce Harman, a trained medical scribe. The creation of this record is based on the scribe's personal observations and the provider's statements to them. This document has been checked and approved by the attending provider.    Sandra Merle, MD 01/03/2017

## 2017-01-03 NOTE — Patient Instructions (Signed)
[facility] Discharge Instructions for Patients Receiving Chemotherapy  Today you received the following chemotherapy agents: Herceptin   To help prevent nausea and vomiting after your treatment, we encourage you to take your nausea medication as directed.    If you develop nausea and vomiting that is not controlled by your nausea medication, call the clinic.   BELOW ARE SYMPTOMS THAT SHOULD BE REPORTED IMMEDIATELY:  *FEVER GREATER THAN 100.5 F  *CHILLS WITH OR WITHOUT FEVER  NAUSEA AND VOMITING THAT IS NOT CONTROLLED WITH YOUR NAUSEA MEDICATION  *UNUSUAL SHORTNESS OF BREATH  *UNUSUAL BRUISING OR BLEEDING  TENDERNESS IN MOUTH AND THROAT WITH OR WITHOUT PRESENCE OF ULCERS  *URINARY PROBLEMS  *BOWEL PROBLEMS  UNUSUAL RASH Items with * indicate a potential emergency and should be followed up as soon as possible.  Feel free to call the clinic you have any questions or concerns. The clinic phone number is [phone].  Please show the CHEMO ALERT CARD at check-in to the Emergency Department and triage nurse.   

## 2017-01-03 NOTE — Progress Notes (Signed)
Dr. Burr Medico to infusion to assess pt. Okay to proceed with treatment per Dr. Burr Medico.

## 2017-01-08 ENCOUNTER — Ambulatory Visit (HOSPITAL_COMMUNITY)
Admission: RE | Admit: 2017-01-08 | Discharge: 2017-01-08 | Disposition: A | Discharge status: Home / Self Care | Payer: BLUE CROSS/BLUE SHIELD | Source: Ambulatory Visit | Attending: Internal Medicine | Admitting: Internal Medicine

## 2017-01-08 ENCOUNTER — Encounter (HOSPITAL_COMMUNITY): Payer: Self-pay | Admitting: Internal Medicine

## 2017-01-08 ENCOUNTER — Other Ambulatory Visit (HOSPITAL_COMMUNITY): Payer: BLUE CROSS/BLUE SHIELD

## 2017-01-08 VITALS — BP 102/66 | HR 80 | Wt 136.2 lb

## 2017-01-08 DIAGNOSIS — K219 Gastro-esophageal reflux disease without esophagitis: Secondary | ICD-10-CM | POA: Insufficient documentation

## 2017-01-08 DIAGNOSIS — I429 Cardiomyopathy, unspecified: Secondary | ICD-10-CM | POA: Diagnosis not present

## 2017-01-08 DIAGNOSIS — Z9013 Acquired absence of bilateral breasts and nipples: Secondary | ICD-10-CM | POA: Insufficient documentation

## 2017-01-08 DIAGNOSIS — C50411 Malignant neoplasm of upper-outer quadrant of right female breast: Secondary | ICD-10-CM | POA: Diagnosis not present

## 2017-01-08 DIAGNOSIS — C50412 Malignant neoplasm of upper-outer quadrant of left female breast: Secondary | ICD-10-CM

## 2017-01-08 DIAGNOSIS — T451X5A Adverse effect of antineoplastic and immunosuppressive drugs, initial encounter: Secondary | ICD-10-CM

## 2017-01-08 DIAGNOSIS — I427 Cardiomyopathy due to drug and external agent: Secondary | ICD-10-CM

## 2017-01-08 DIAGNOSIS — E119 Type 2 diabetes mellitus without complications: Secondary | ICD-10-CM | POA: Insufficient documentation

## 2017-01-08 DIAGNOSIS — Z853 Personal history of malignant neoplasm of breast: Secondary | ICD-10-CM | POA: Diagnosis not present

## 2017-01-08 DIAGNOSIS — Z79899 Other long term (current) drug therapy: Secondary | ICD-10-CM | POA: Insufficient documentation

## 2017-01-08 MED ORDER — LOSARTAN POTASSIUM 25 MG PO TABS: 12.5000 mg | ORAL_TABLET | Freq: Every day | ORAL | 3 refills | Status: DC

## 2017-01-08 MED ORDER — BISOPROLOL FUMARATE 5 MG PO TABS: 2.5000 mg | ORAL_TABLET | Freq: Every day | ORAL | 3 refills | Status: DC

## 2017-01-08 NOTE — Progress Notes (Signed)
Patient ID: Sandra James, female   DOB: 11-Aug-1966, 51 y.o.   MRN: [id number]     [place] NOTE  Referring Physician: Burr Medico Primary Care: Kristie Cowman Primary Cardiologist: Dr. Haroldine Laws   HPI:  Sandra James is a 51 y.o. female with h/o DM2 and GERD who was diagnosed with right breast invasive ductal carcinoma [id number], stage IIB, grade 2, ER+/PR+, HER2+ with + 1 out of 3 + sentinel lymph nodes. Discovered on screening mammogram 09/07/15. She is now s/p bilateral mastectomy 11/30/15. She is referred to toe cardio-oncology clinic by Dr. Burr Medico  Of note, she had a left breast lumpectomy in 2004 and was treated at that time with Adriamycin.    Oncology History   Breast cancer of upper-outer quadrant of right female breast [facility])  Staging form: Breast, AJCC 7th Edition  Clinical stage from 09/15/2015: Stage IA (T1b, N0, M0) - Unsigned       Breast cancer of upper-outer quadrant of right female breast ([place])   09/07/2015 Mammogram Diagnostic mammogram and the ultrasound of the right breast showed a 0.8 cm lobulated mass in the right breast 11 to 12:00 position 9 cm from the nipple. No other lesions or adenopathy.   09/08/2015 Initial Diagnosis Breast cancer of upper-outer quadrant of right female breast ([place])   09/08/2015 Initial Biopsy Right breast mass at the upper outer quadrant core needle biopsy showed invasive ductal carcinoma, grade 2-3, ductal carcinoma in situ.   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 Left breast ostectomy was negative for malignancy.       She returns today for follow up. Previously  herceptin stopped very mild cardiotoxicity which recovered. Herceptin restarted in June 2017. She is tolerating well. Schedule to complete Herceptin in March. No HF symptoms.    Scheduled for 1 year of herceptin. (started 01/07/16)   ECHO 12/21/15 55-60% Lateral s' 11.0   cm/s     GLS -18.5% no MVP ECHO 03/21/16 50-55% Lateral s' 9.2     cm/s     GLS -16.1% ECHO 05/09/16 55%      Lateral s' 12.06 cm/s     GLS -14.4% ECHO 05/30/16 60%      Lateral s' 12.1  cm/s     GLS -18.0% ECHO 08/02/16 55-60%        GLS -18.0% (echo reviewed personally)  ECHO 01/02/17 EF 55%  Lateral s' 10.5 cm/s  GLS -14.8 (underestimated due to poor endocardial tracking)  Past Medical History:  Diagnosis Date  . Allergy   . Anxiety   . Breast cancer ([place]) 09/07/15   right breast  . Breast cancer of upper-outer quadrant of right female breast ([place]) 09/09/2015  . Cancer [facility])    left brast lumpectomy   . Complication of anesthesia    slow to wake up- BP was low, fainted next day-  . Depression   . Diabetes mellitus without complication ([place])   . Fibroid   . GERD (gastroesophageal reflux disease)   . Infection    UTI  . MVP (mitral valve prolapse)   . Neuromuscular disorder ([place])    sciatic nerve paion left side/leg  . UTI (lower urinary tract infection)  Current Outpatient Prescriptions  Medication Sig Dispense Refill  . bisoprolol (ZEBETA) 5 MG tablet Take 0.5 tablets (2.5 mg total) by mouth at bedtime. 15 tablet 3  . exemestane (AROMASIN) 25 MG tablet TAKE 1 TABLET BY MOUTH DAILY AFTER BREAKFAST. 30 tablet 1  . fluticasone (FLONASE) 50 MCG/ACT nasal spray Place 1 spray into both nostrils daily as needed for allergies.   0  . gabapentin (NEURONTIN) 100 MG capsule   1  . ibuprofen (ADVIL,MOTRIN) 800 MG tablet Take 800 mg by mouth daily as needed for headache or moderate pain.    . Iron-Vitamins (GERITOL) LIQD Take 30 mLs by mouth daily. Energy support    . losartan (COZAAR) 25 MG tablet Take 0.5 tablets (12.5  mg total) by mouth daily. 90 tablet 3  . traMADol (ULTRAM) 50 MG tablet Take 1 tablet (50 mg total) by mouth every 6 (six) hours as needed. 50 tablet 0  . venlafaxine XR (EFFEXOR XR) 37.5 MG 24 hr capsule Take 1 capsule (37.5 mg total) by mouth daily with breakfast. 45 capsule 0  . zolpidem (AMBIEN) 10 MG tablet Take 1 tablet (10 mg total) by mouth at bedtime as needed for sleep. 30 tablet 2   No current facility-administered medications for this encounter.     Allergies  Allergen Reactions  . Lisinopril Cough  . Oxycodone Itching  . Percocet [Oxycodone-Acetaminophen] Itching      Social History   Social History  . Marital status: Single    Spouse name: N/A  . Number of children: N/A  . Years of education: N/A   Occupational History  . Not on file.   Social History Main Topics  . Smoking status: Never Smoker  . Smokeless tobacco: Never Used     Comment: previous secondhand smoke exposure  . Alcohol use 2.4 oz/week    4 Glasses of wine per week     Comment: 1 bottle of wine per wk  . Drug use: No  . Sexual activity: Yes    Birth control/ protection: Surgical   Other Topics Concern  . Not on file   Social History Narrative  . No narrative on file      Family History  Problem Relation Age of Onset  . Hypertension Mother   . Stroke Mother   . Alzheimer's disease Mother   . Heart disease Father   . Stroke Father   . Heart attack Father   . Breast cancer Sister 46    double mastectomy  . Other Sister     "stomach tumor that wrapped around reproductive organs"; dx. 26s; required partial hysterectomy  . Alzheimer's disease Maternal Grandmother   . Alzheimer's disease Paternal Grandmother   . Other Other     had to have lymph nodes removed and radiation; dx. 34s  . Cancer Maternal Aunt     unspecified type; dx. <50  . Colon cancer Maternal Uncle     dx. 84s  . Breast cancer Cousin     dx. 50s    Vitals:   01/08/17 1016  BP: 102/66  Pulse: 80  SpO2:  100%  Weight: 136 lb 4 oz (61.8 kg)   Wt Readings from Last 3 Encounters:  01/08/17 136 lb 4 oz (61.8 kg)  11/22/16 136 lb 6.4 oz (61.9 kg)  11/01/16 139 lb 1.6 oz (63.1 kg)     PHYSICAL EXAM: General:  Well appearing. No respiratory difficulty HEENT: normal x for alopecia Neck: supple. no JVD. Carotids 2+ bilat;  no bruits. No thyromegaly or nodule noted.  Cor: s/p bilateral mastectomies. PMI nondisplaced. RRR. No M/G/R.  Lungs: CTAB, normal effort Abdomen: soft, NT, ND, no HSM. No bruits or masses. +BS  Extremities: no cyanosis, clubbing, rash, edema Neuro: alert & oriented x 3, cranial nerves grossly intact. moves all 4 extremities w/o difficulty. Affect pleasant.  ASSESSMENT & PLAN:  1. Breast cancer - Right breast invasive ductal carcinoma, pT1cN1aM0, stage IIB, grade 2, ER+/PR+/HER2+  - Now s/p bilateral mastectomy - Has a history of stage 1 L breast CA s/p lumpectomy ( now as above, s/p B mastectomy) and was treated at that time with Adriamycin.  - Now s/p 6/6 cycles of adjuvant chemo + herceptin/perjeta. - Back on Herceptin. Echo shows relatively stable function. GLS mildly depressed but I think this is due to poor endocardial tracking and not HErceptin toxicity. Will continue. Repeat echo in 2 months for close surveillance  2. Cardiotoxicity - Very mild chemo-induced cardiomyoapthy. Echo today reviewed personally as above - Continue herceptin.  Repeat echo 2-3 months after completion of therapy.  - Continue bisoprolol 2.5 and losartan 12.5 will stop when Herceptin complete.  .   Bensimhon, Daniel,MD 10:30 AM

## 2017-01-08 NOTE — Patient Instructions (Signed)
Echo and Follow up in 3 months

## 2017-01-08 NOTE — Addendum Note (Signed)
Encounter addended by: Kennieth Rad, RN on: 01/08/2017 10:44 AM<BR>    Actions taken: Pharmacy for encounter modified, Order list changed, Diagnosis association updated, Sign clinical note

## 2017-01-08 NOTE — Addendum Note (Signed)
Encounter addended by: Kennieth Rad, RN on: 01/08/2017 10:46 AM<BR>    Actions taken: Order list changed, Diagnosis association updated

## 2017-01-11 ENCOUNTER — Encounter: Payer: Self-pay | Admitting: Hematology

## 2017-01-11 IMAGING — NM NM BONE WHOLE BODY
4 series · 4 of 4 positions shown · non-contrast
Comparison: 12/29/2015

CLINICAL DATA: Breast cancer.

EXAM:
NUCLEAR MEDICINE WHOLE BODY BONE SCAN
TECHNIQUE: Whole body anterior and posterior images were obtained approximately
3 hours after intravenous injection of radiopharmaceutical.
RADIOPHARMACEUTICALS:  21.0 mCi 6echnetium-66m MDP IV

[Series 1: wbr_bone_40 whole body · 2.66mm/px · 1 of 1 slices shown (1 of 2)]
[im 1/1]
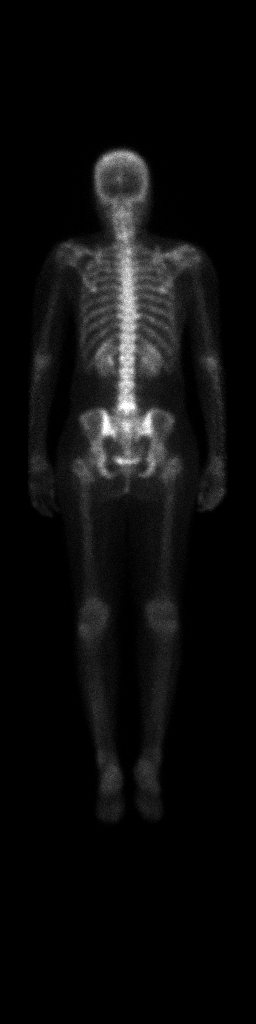

[Series 1: wbr_bone_40 whole body · 2.66mm/px · 1 of 1 slices shown (2 of 2)]
[im 1/1]
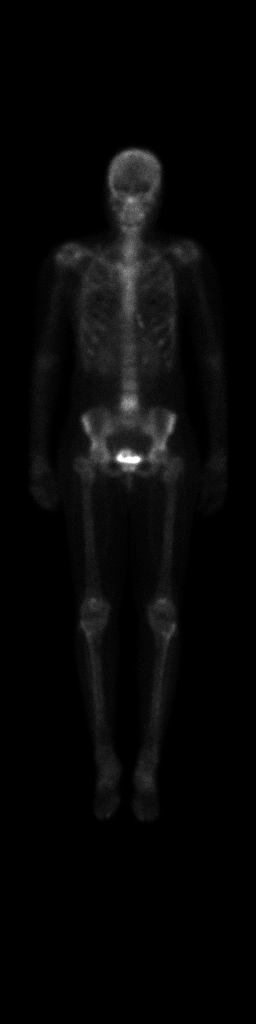

[Series 1: whole body · 2.66mm/px · 1 of 1 slices shown (1 of 2)]
[im 1/1]
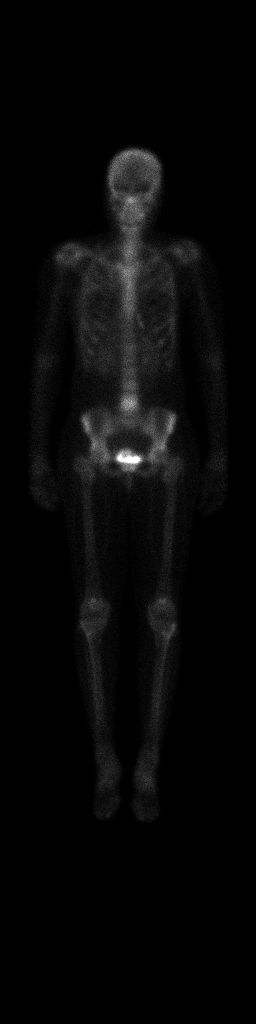

[Series 1: whole body · 2.66mm/px · 1 of 1 slices shown (2 of 2)]
[im 1/1]
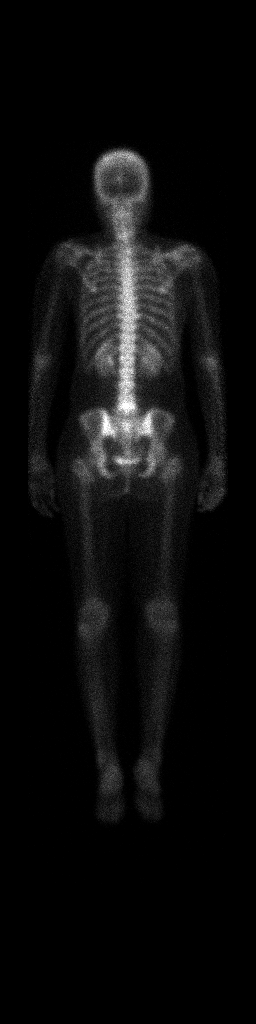

[4 of 4 positions shown; findings below may reference images not displayed]

FINDINGS: Degenerative type changes are noted within the lower lumbar spine.
No abnormal radiotracer activity identified specific for bone
metastases.There is normal physiologic activity within both kidneys
and the urinary bladder.
IMPRESSION: 1. No evidence for metastatic disease.
2. Lumbar spine degenerative disc disease.

## 2017-01-11 IMAGING — CT CT ABDOMEN W/ CM
3 of 13 series · 12 of 46 positions shown, 18 images · IV contrast (ISOVUE 300)
Comparison: CT abdomen/ pelvis dated 12/29/2015

CLINICAL DATA: Breast cancer, follow-up liver lesion

EXAM:
CT ABDOMEN WITH CONTRAST
TECHNIQUE: Multidetector CT imaging of the abdomen was performed using the
standard protocol following bolus administration of intravenous
contrast.
CONTRAST:  100mL [id number] IOPAMIDOL ([id number]) INJECTION 61%

[Series 4: coronal soft tissue · coronal · 0.30mm/px · 2 of 66 slices shown, 3 images]
[im 22/66  soft-tissue]
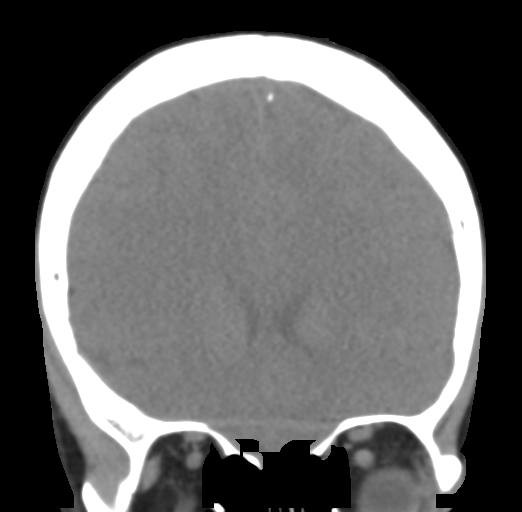
[im 22/66  bone]
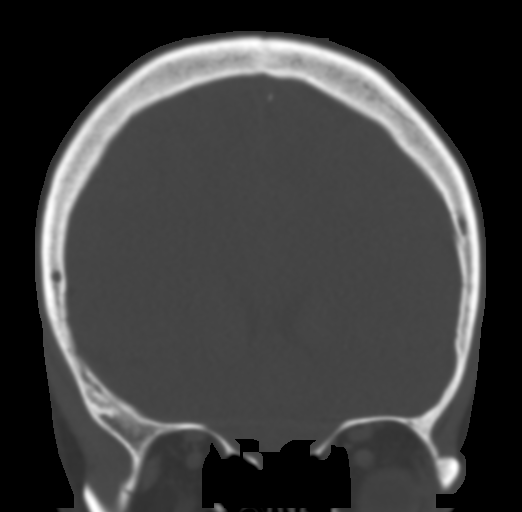
[im 44/66  soft-tissue]
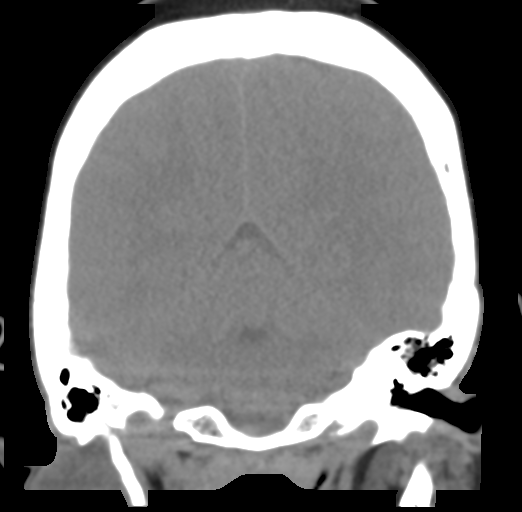

[Series 8: axial arterial · axial · arterial · 0.72mm/px · z∈[-676,-472]mm · 5 of 104 slices shown]
[im 18/104  soft-tissue]
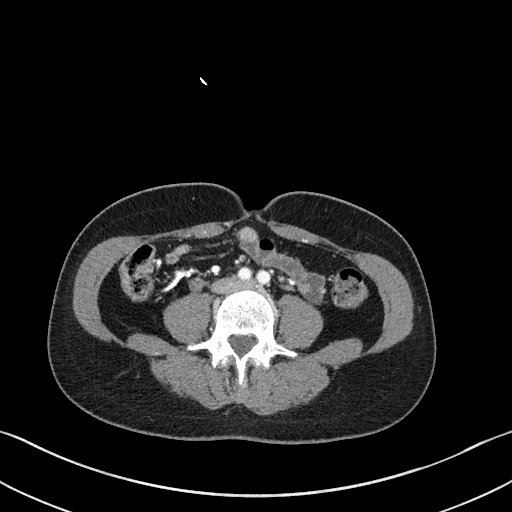
[im 35/104  soft-tissue]
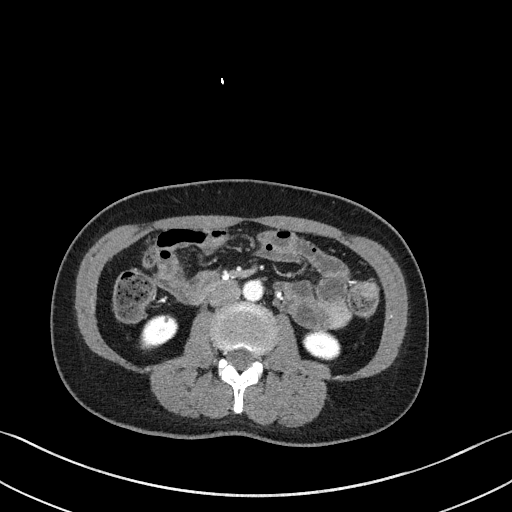
[im 52/104  soft-tissue]
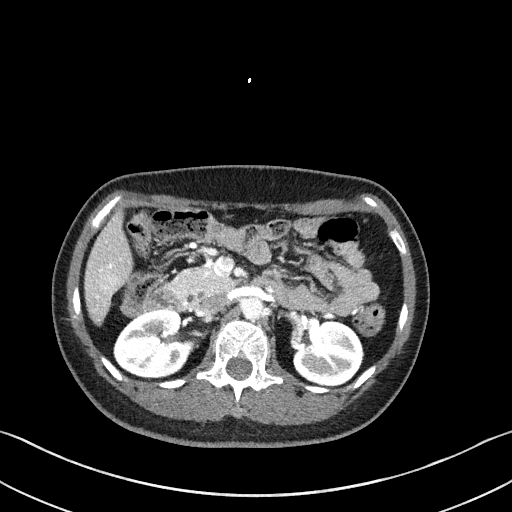
[im 69/104  soft-tissue]
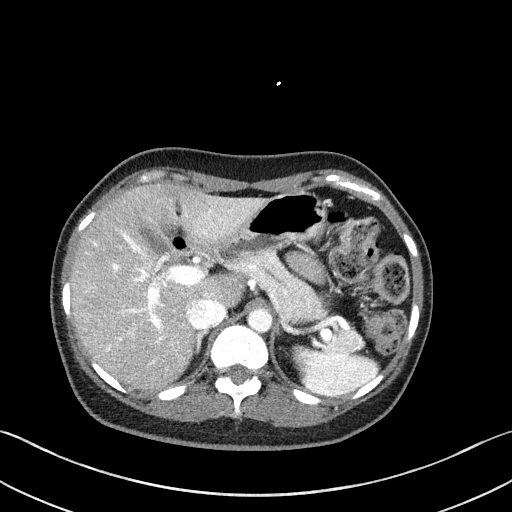
[im 86/104  soft-tissue]
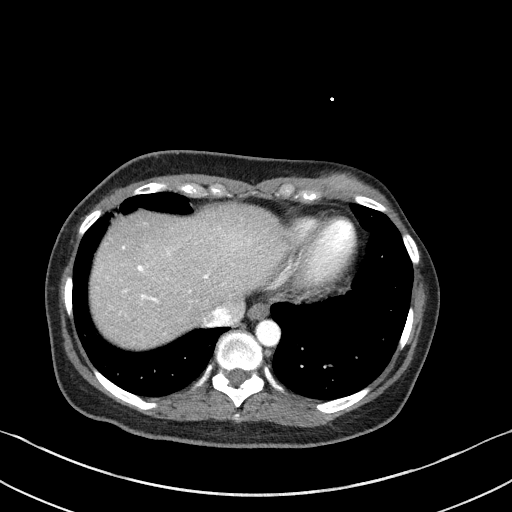

[Series 13: axial venous · axial · portal-venous · 0.72mm/px · z∈[-676,-472]mm · 5 of 104 slices shown, 10 images]
[im 18/104  soft-tissue]
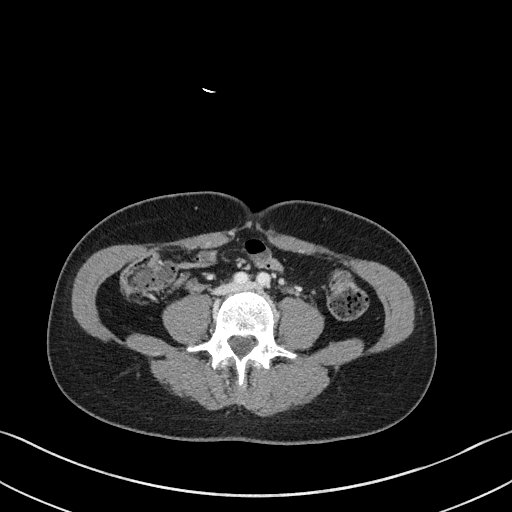
[im 18/104  bone]
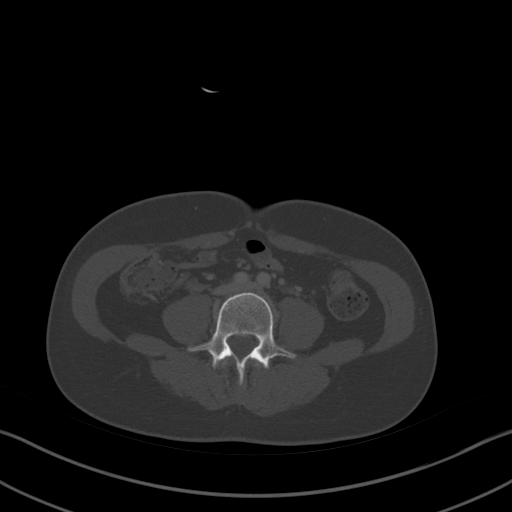
[im 35/104  soft-tissue]
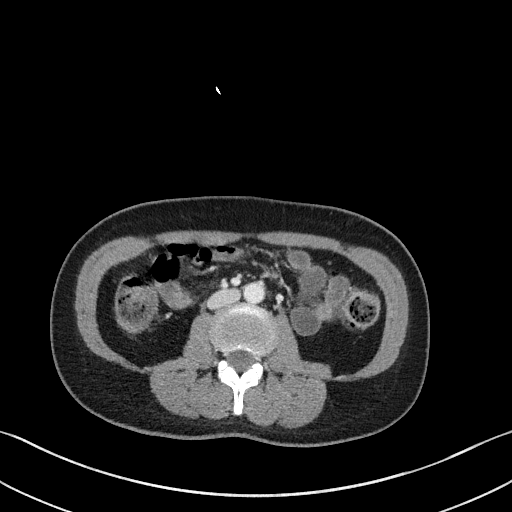
[im 35/104  lung]
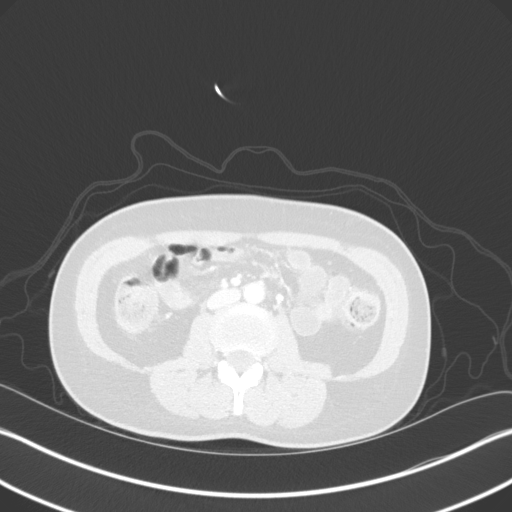
[im 52/104  soft-tissue]
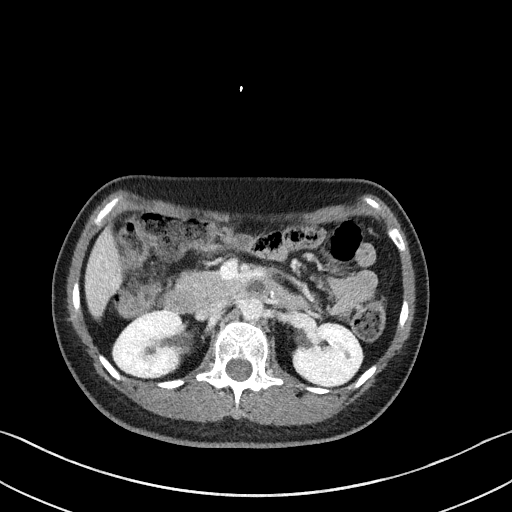
[im 52/104  lung]
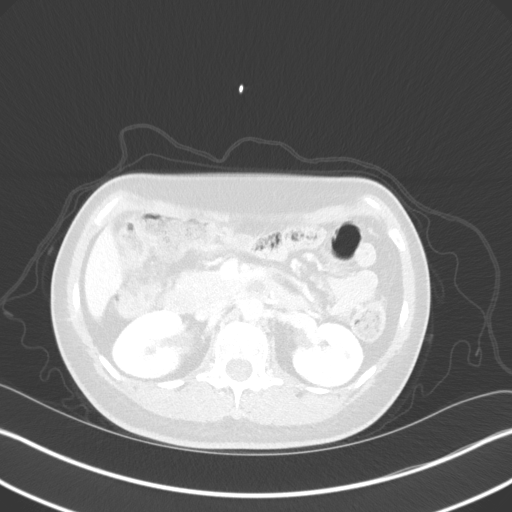
[im 69/104  soft-tissue]
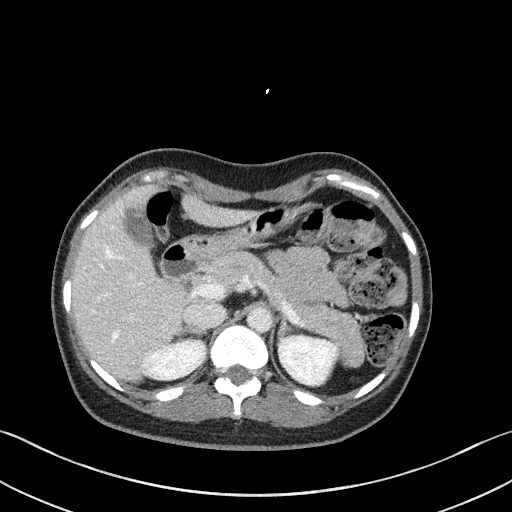
[im 69/104  lung]
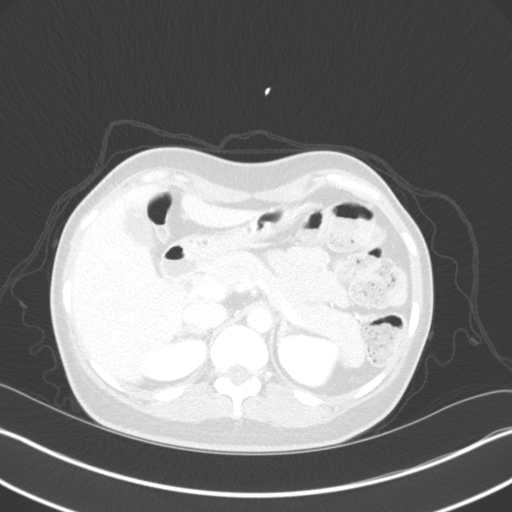
[im 86/104  soft-tissue]
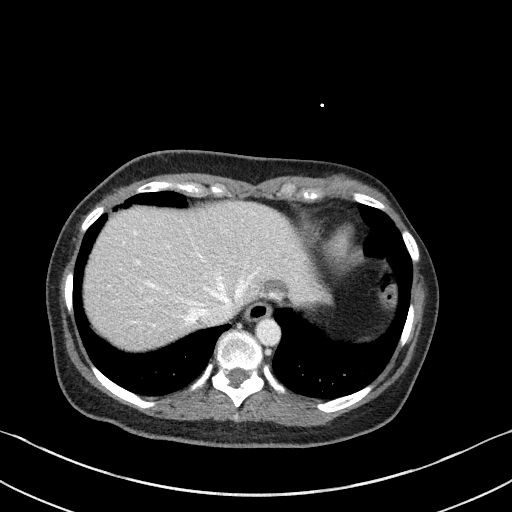
[im 86/104  lung]
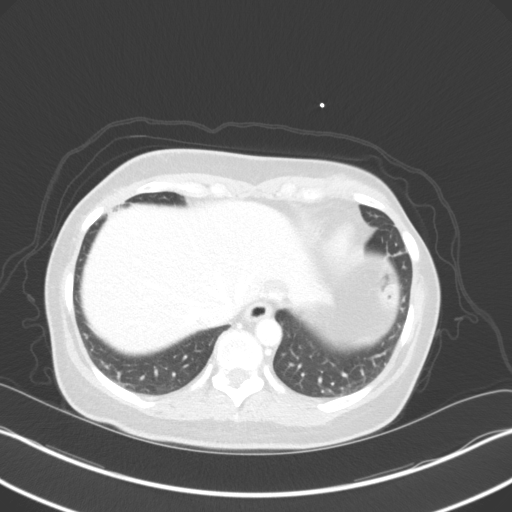

[12 of 46 positions shown; findings below may reference images not displayed]

FINDINGS: Lower chest: Lung bases are clear.  Bilateral breast augmentation.

Hepatobiliary: 1.8 cm hypervascular lesion in segment 6 (series 8/
image 51), only conspicuous on the arterial phase.

1.9 cm lesion with peripheral nodular discontinuous arterial
enhancement and delayed wash-in along the posterior aspect of
segment 3 (series 8/image 23), compatible with a benign hemangioma.

Gallbladder are within normal limits. No intrahepatic or
extrahepatic ductal dilatation.

Pancreas: Within normal limits.

Spleen: Within normal limits.

Adrenals/Urinary Tract: Adrenal glands are within normal limits.

Kidneys are within normal limits.  No hydronephrosis.

Stomach/Bowel: Stomach is within normal limits.

Visualized bowel is unremarkable.

Vascular/Lymphatic: No evidence of abdominal aortic aneurysm.

No suspicious abdominal lymphadenopathy.

Other: No abdominal ascites.

Musculoskeletal: Mild degenerative changes at L4-5.
IMPRESSION: 1.8 cm hypervascular lesion in segment 6, unchanged. Given
stability, a benign etiology such as FNH or flash filling hemangioma
is favored. Metastasis is difficult to exclude but is considered
less likely.

1.9 cm lesion in segment 3, compatible with a benign hemangioma.

No findings specific for metastatic disease in the abdomen. Please
note that the pelvis was not imaged.

## 2017-01-11 NOTE — Progress Notes (Signed)
Receive letter stating Sandra James co-pay card for Herceptin would be exprring 01/19/17.  Reviewed schedule and saw patient will be receiving 01/24/17. Called patient to verify information and get permission to re-enroll being that I will be out of the office next week. Patient verified and granted permission. Patient approved for $25,000 01/11/17-01/10/18 leaving the patient with only a $5 co-pay. A copy will be mailed to the patient. Copy emailed to billing. Copy placed to be scanned by HIM.

## 2017-01-13 ENCOUNTER — Telehealth: Payer: Self-pay | Admitting: Hematology

## 2017-01-13 NOTE — Telephone Encounter (Signed)
Lvm advising md apt moved to 2/6 has been moved back to 2/14 with APP so that pt's herceptin can remain on sch. Advised pt in vm, appt with APP is @ 8.30am on 2/14 prior to infusion.

## 2017-01-16 ENCOUNTER — Ambulatory Visit: Payer: BLUE CROSS/BLUE SHIELD | Admitting: Hematology

## 2017-01-24 ENCOUNTER — Encounter: Payer: Self-pay | Admitting: Adult Health

## 2017-01-24 ENCOUNTER — Ambulatory Visit (HOSPITAL_BASED_OUTPATIENT_CLINIC_OR_DEPARTMENT_OTHER): Payer: BLUE CROSS/BLUE SHIELD

## 2017-01-24 ENCOUNTER — Ambulatory Visit (HOSPITAL_BASED_OUTPATIENT_CLINIC_OR_DEPARTMENT_OTHER): Payer: BLUE CROSS/BLUE SHIELD | Admitting: Adult Health

## 2017-01-24 ENCOUNTER — Ambulatory Visit: Payer: BLUE CROSS/BLUE SHIELD | Admitting: Hematology

## 2017-01-24 VITALS — BP 108/77 | HR 80 | Temp 98.6°F | Resp 18 | Ht 66.0 in | Wt 137.5 lb

## 2017-01-24 DIAGNOSIS — R51 Headache: Secondary | ICD-10-CM | POA: Diagnosis not present

## 2017-01-24 DIAGNOSIS — E119 Type 2 diabetes mellitus without complications: Secondary | ICD-10-CM | POA: Diagnosis not present

## 2017-01-24 DIAGNOSIS — Z5112 Encounter for antineoplastic immunotherapy: Secondary | ICD-10-CM

## 2017-01-24 DIAGNOSIS — Z170 Estrogen receptor positive status [ER+]: Secondary | ICD-10-CM | POA: Diagnosis not present

## 2017-01-24 DIAGNOSIS — C50411 Malignant neoplasm of upper-outer quadrant of right female breast: Secondary | ICD-10-CM | POA: Diagnosis not present

## 2017-01-24 DIAGNOSIS — G4720 Circadian rhythm sleep disorder, unspecified type: Secondary | ICD-10-CM

## 2017-01-24 MED ORDER — TRASTUZUMAB CHEMO 150 MG IV SOLR
6.0000 mg/kg | Freq: Once | INTRAVENOUS | Status: AC
  Administered 2017-01-24: 378 mg via INTRAVENOUS
  Filled 2017-01-24: qty 18

## 2017-01-24 MED ORDER — TRAZODONE HCL 50 MG PO TABS: 50.0000 mg | ORAL_TABLET | Freq: Every day | ORAL | 0 refills | Status: DC

## 2017-01-24 MED ORDER — DIPHENHYDRAMINE HCL 25 MG PO CAPS
ORAL_CAPSULE | ORAL | Status: AC
  Filled 2017-01-24: qty 2

## 2017-01-24 MED ORDER — SODIUM CHLORIDE 0.9 % IV SOLN
Freq: Once | INTRAVENOUS | Status: AC
  Administered 2017-01-24: 10:00:00 via INTRAVENOUS

## 2017-01-24 MED ORDER — SODIUM CHLORIDE 0.9% FLUSH
10.0000 mL | INTRAVENOUS | Status: DC | PRN
  Administered 2017-01-24: 10 mL
  Filled 2017-01-24: qty 10

## 2017-01-24 MED ORDER — ACETAMINOPHEN 325 MG PO TABS
650.0000 mg | ORAL_TABLET | Freq: Once | ORAL | Status: AC
  Administered 2017-01-24: 650 mg via ORAL

## 2017-01-24 MED ORDER — ACETAMINOPHEN 325 MG PO TABS
ORAL_TABLET | ORAL | Status: AC
  Filled 2017-01-24: qty 2

## 2017-01-24 MED ORDER — DIPHENHYDRAMINE HCL 25 MG PO CAPS
50.0000 mg | ORAL_CAPSULE | Freq: Once | ORAL | Status: AC
  Administered 2017-01-24: 50 mg via ORAL

## 2017-01-24 MED ORDER — HEPARIN SOD (PORK) LOCK FLUSH 100 UNIT/ML IV SOLN
500.0000 [IU] | Freq: Once | INTRAVENOUS | Status: AC | PRN
  Administered 2017-01-24: 500 [IU]
  Filled 2017-01-24: qty 5

## 2017-01-24 MED FILL — traZODone HCL 50 MG TABS: 50 | 30 days supply | Qty: 60 | Fill #0

## 2017-01-24 NOTE — Patient Instructions (Signed)
[facility] Discharge Instructions for Patients Receiving Chemotherapy  Today you received the following chemotherapy agents: Herceptin   To help prevent nausea and vomiting after your treatment, we encourage you to take your nausea medication as directed.    If you develop nausea and vomiting that is not controlled by your nausea medication, call the clinic.   BELOW ARE SYMPTOMS THAT SHOULD BE REPORTED IMMEDIATELY:  *FEVER GREATER THAN 100.5 F  *CHILLS WITH OR WITHOUT FEVER  NAUSEA AND VOMITING THAT IS NOT CONTROLLED WITH YOUR NAUSEA MEDICATION  *UNUSUAL SHORTNESS OF BREATH  *UNUSUAL BRUISING OR BLEEDING  TENDERNESS IN MOUTH AND THROAT WITH OR WITHOUT PRESENCE OF ULCERS  *URINARY PROBLEMS  *BOWEL PROBLEMS  UNUSUAL RASH Items with * indicate a potential emergency and should be followed up as soon as possible.  Feel free to call the clinic you have any questions or concerns. The clinic phone number is [phone].  Please show the CHEMO ALERT CARD at check-in to the Emergency Department and triage nurse.   

## 2017-01-24 NOTE — Progress Notes (Signed)
[place]  [phone] [phone]  Clinic follow Up Note   Patient Care Team: Kristie Cowman, MD as PCP - General (Family Medicine) Stark Klein, MD as Consulting Physician (General Surgery) Truitt Merle, MD as Consulting Physician (Hematology) Arloa Koh, MD as Consulting Physician (Radiation Oncology) Mauro Kaufmann, RN as Registered Nurse Rockwell Germany, RN as Registered Nurse Sylvan Cheese, NP as Nurse Practitioner (Nurse Practitioner) 01/24/2017  CHIEF COMPLAINTS:  Follow up right breast cancer  Oncology History   Breast cancer [facility])   Staging form: Breast, AJCC 7th Edition     Pathologic stage from 11/25/2015: Stage IIA (T1c, N1a, cM0) - Unsigned     Pathologic stage from 11/25/2015: Stage IIA (T1c, N1a, cM0) - Unsigned Breast cancer of upper-outer quadrant of right female breast [facility])   Staging form: Breast, AJCC 7th Edition     Clinical stage from 09/15/2015: Stage IA (T1b, N0, M0) - Signed by Truitt Merle, MD on 12/16/2015     Pathologic stage from 11/25/2015: Stage IIA (T1c, N1a, cM0) - Signed by Truitt Merle, MD on 12/16/2015         Breast cancer of upper-outer quadrant of right female breast ([place])   09/07/2015 Mammogram    Diagnostic mammogram and the ultrasound of the right breast showed a 0.8 cm lobulated mass in the right breast 11 to 12:00 position 9 cm from the nipple. No other lesions or adenopathy.      09/08/2015 Initial Diagnosis    Breast cancer of upper-outer quadrant of right female breast ([place])      09/08/2015 Initial Biopsy    Right breast mass at the upper outer quadrant core needle biopsy showed invasive ductal carcinoma, grade 2-3, ductal carcinoma in situ.      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 Left breast ostectomy was negative for malignancy.      01/06/2016 - 04/20/2016 Chemotherapy    adjuvant chemotherapy TCH P, every 3 weeks, for a total of 6 cycles. Herceptin and Perjeta were held for last two cycles due to drop of her EF on echo       05/25/2016 - 07/11/2016 Radiation Therapy    Adjuvant breast radiation      06/08/2016 -  Chemotherapy    She started maintenance Herceptin every 3 weeks, after clearance from cardiology.      08/11/2016 -  Anti-estrogen oral therapy    Exemestane 25 mg daily       HISTORY OF PRESENTING ILLNESS:  Sandra James 51 y.o. female with past medical history of stage I left breast cancer, is here because of newly diagnosed right breast cancer. She presents to our multidisciplinary breast clinic by herself.  The right breast cancer was discovered by screening mammogram. She did not have any palpable mass, or any constitutional symptoms. She was diagnosed with stage I left breast cancer at age of 65, she had lumpectomy, radiation, and adjuvant chemotherapy and 5 years of tamoxifen. She was treated by Dr. Louann Sjogren in [place]. She moved to [facility] about year ago due to job  change. She has been very compliant with annual screening mammogram.  She had hysterectomy and bilateral oophorectomy and sphincterectomy 5 years ago for heavy bleeding.. She has been having hot flashes since then, moderate, but manageable. She is single, lives alone, works for 2 to [employer] has to go up children who live in [place].  CURRENT THERAPY:  1. maintenance Herceptin every 3 weeks, until March 2018  2. Exemestane 25 mg daily, started on 08/11/2016  Mantoloking returns for follow-up and herceptin treatment. She is doing well today for the most part.  I went through her medications with her in detail.   She has stopped the gabapentin (no longer has neuropathy), Effexor, Tramadol.  She is taking the Exemestane daily and without difficulty.    She saw Dr. Haroldine Laws on 1/29 and was cleared to continue Herceptin.  She continues to take Losartan and Bisoprolol daily and is tolerating these well.  Her blood pressure and heart rate are under good control today.    Her continued concern is insomnia that is no longer responding to Ambien.  She is taking 72m of Ambien per night in order to get to sleep.  She started on 565m then increased to 1011mand increased herself to 60m74mhe is working two jobs, drinks minimal caffeine.  She says that the sleep difficulties started once she started Herceptin.  Shortly after starting herceptin, she was started on Bisoprolol.    MED ICAL HISTORY:  Past Medical History:  Diagnosis Date  . Allergy   . Anxiety   . Breast cancer (HCC)[place]27/16   right breast  . Breast cancer of upper-outer quadrant of right female breast (HCC)[place]29/2016  [facility]   . Complication of anesthesia    slow to wake up- BP was low, fainted next day-  . Depression   . Diabetes mellitus without complication (HCC)[place]. Fibroid   . GERD (gastroesophageal reflux disease)   . Infection    UTI  . MVP (mitral valve prolapse)   . Neuromuscular disorder (HCC)[place] sciatic nerve paion left side/leg  . UTI (lower urinary tract infection)     SURGICAL HISTORY: Past Surgical History:  Procedure Laterality Date  . ABDOMINAL HYSTERECTOMY    . BREAST RECONSTRUCTION WITH PLACEMENT OF TISSUE EXPANDER AND FLEX HD (ACELLULAR HYDRATED DERMIS) Bilateral 11/25/2015   Procedure: BILATERAL IMMEDIATE BREAST RECONSTRUCTION WITH PLACEMENT OF TISSUE EXPANDERS AND FLEX HD (ACELLULAR HYDRATED DERMIS);  Surgeon: ClaiWallace Going;  Location: MOSEUnicoiervice: Plastics;  Laterality: Bilateral;  . BREAST SURGERY  2004   left lumpectomy  . MASTECTOMY W/  SENTINEL NODE BIOPSY Bilateral 11/25/2015   Procedure: BILATERAL SKIN SPARING MASTECTOMIES WITH RIGHT SENTINEL LYMPH NODE BIOPSY;  Surgeon: FaerStark Klein;  Location: MOSEPalmaservice: General;  Laterality: Bilateral;  . OOPHORECTOMY Bilateral   . PORTACATH PLACEMENT Left 12/20/2015   Procedure: INSERTION PORT-A-CATH, left chest;  Surgeon: FaerStark Klein;  Location: [facility] O[facility]ervice: General;  Laterality: Left;    SOCIAL HISTORY: Social History   Social History  . Marital Status: Single     Spouse Name: N/A  . Number of Children: 2 children, 30 [id number] and 20 [id number]    . Years of Education: N/A   Occupational History  . She is a custBarista   Social History Main Topics  . Smoking status: Never Smoker   .  Smokeless tobacco: Never Used  . Alcohol Use: Yes     Comment: 2-3/wk  . Drug Use: No  . Sexual Activity: Yes    Birth Control/ Protection: Surgical   Other Topics Concern  . Not on file   Social History Narrative    FAMILY HISTORY: Family History  Problem Relation Age of Onset  . Hypertension Mother   . Stroke Mother   . Alzheimer's disease Mother   . Heart disease Father   . Stroke Father   . Heart attack Father   . Breast cancer Sister 29    double mastectomy  . Other Sister     "stomach tumor that wrapped around reproductive organs"; dx. 52s; required partial hysterectomy  . Alzheimer's disease Maternal Grandmother   . Alzheimer's disease Paternal Grandmother   . Other Other     had to have lymph nodes removed and radiation; dx. 19s  . Cancer Maternal Aunt     unspecified type; dx. <50  . Colon cancer Maternal Uncle     dx. 63s  . Breast cancer Cousin     dx. 110s    ALLERGIES:  is allergic to lisinopril; oxycodone; and percocet [oxycodone-acetaminophen].  MEDICATIONS:  Current Outpatient Prescriptions  Medication Sig Dispense Refill  . bisoprolol (ZEBETA) 5 MG tablet Take 0.5 tablets (2.5 mg total) by mouth at bedtime. 15  tablet 3  . exemestane (AROMASIN) 25 MG tablet TAKE 1 TABLET BY MOUTH DAILY AFTER BREAKFAST. 30 tablet 1  . fluticasone (FLONASE) 50 MCG/ACT nasal spray Place 1 spray into both nostrils daily as needed for allergies.   0  . ibuprofen (ADVIL,MOTRIN) 800 MG tablet Take 800 mg by mouth daily as needed for headache or moderate pain.    . Iron-Vitamins (GERITOL) LIQD Take 30 mLs by mouth daily. Energy support    . losartan (COZAAR) 25 MG tablet Take 0.5 tablets (12.5 mg total) by mouth daily. 45 tablet 3  . traZODone (DESYREL) 50 MG tablet Take 1 tablet (50 mg total) by mouth at bedtime. May increase to 131m QHS if needed 60 tablet 0   No current facility-administered medications for this visit.    Facility-Administered Medications Ordered in Other Visits  Medication Dose Route Frequency Provider Last Rate Last Dose  . heparin lock flush 100 unit/mL  500 Units Intracatheter Once PRN YTruitt Merle MD      . sodium chloride flush (NS) 0.9 % injection 10 mL  10 mL Intracatheter PRN YTruitt Merle MD      . trastuzumab (HERCEPTIN) 378 mg in sodium chloride 0.9 % 250 mL chemo infusion  6 mg/kg (Treatment Plan Adjusted) Intravenous Once YTruitt Merle MD        REVIEW OF SYSTEMS:   Constitutional: Denies fevers, chills or abnormal night sweats (+) fatigue for one day last week, improved with time Eyes: Denies blurriness of vision, double vision or watery eyes Ears, nose, mouth, throat, and face: Denies mucositis or sore throat Respiratory: Denies cough, dyspnea or wheezes Cardiovascular: Denies palpitation, chest discomfort or lower extremity swelling Gastrointestinal:  Denies nausea, heartburn or change in bowel habits Skin: Denies abnormal skin rashes Lymphatics: Denies new lymphadenopathy or easy bruising Neurological:Denies numbness, tingling or new weaknesses Behavioral/Psych: Mood is stable, no new changes  All other systems were reviewed with the patient and are negative.  PHYSICAL EXAMINATION: ECOG  PERFORMANCE STATUS: 1 BP 108/77 (BP Location: Left Arm, Patient Position: Sitting)   Pulse 80   Temp 98.6 F (37 C) (Oral)  Resp 18   Ht 5' 6"  (1.676 m)   Wt 137 lb 8 oz (62.4 kg)   SpO2 99%   BMI 22.19 kg/m   Vitals with BMI 01/03/2017  Height   Weight   BMI   Systolic 299  Diastolic 68  Pulse 78  Respirations 18   GENERAL: Patient is a well appearing female in no acute distress HEENT:  Sclerae anicteric.  Oropharynx clear and moist. No ulcerations or evidence of oropharyngeal candidiasis. Neck is supple.  NODES:  No cervical, supraclavicular, or axillary lymphadenopathy palpated.  BREAST EXAM:  Deferred. LUNGS:  Clear to auscultation bilaterally.  No wheezes or rhonchi. HEART:  Regular rate and rhythm. No murmur appreciated. ABDOMEN:  Soft, nontender.  Positive, normoactive bowel sounds. No organomegaly palpated. MSK:  No focal spinal tenderness to palpation. Full range of motion bilaterally in the upper extremities. EXTREMITIES:  No peripheral edema.   SKIN:  Clear with no obvious rashes or skin changes. No nail dyscrasia. NEURO:  Nonfocal. Well oriented.  Appropriate affect.     LABORATORY DATA:  I have reviewed the data as listed CBC Latest Ref Rng & Units 01/03/2017 11/22/2016 10/12/2016  WBC 3.9 - 10.3 10e3/uL 4.6 4.9 4.6  Hemoglobin 11.6 - 15.9 g/dL 11.7 12.1 12.6  Hematocrit 34.8 - 46.6 % 34.3(L) 36.5 37.7  Platelets 145 - 400 10e3/uL 304 295 331    CMP Latest Ref Rng & Units 01/03/2017 11/22/2016 10/12/2016  Glucose 70 - 140 mg/dl 92 88 101  BUN 7.0 - 26.0 mg/dL 12.0 7.9 8.5  Creatinine 0.6 - 1.1 mg/dL 0.7 0.7 0.7  Sodium 136 - 145 mEq/L 140 140 143  Potassium 3.5 - 5.1 mEq/L 3.4(L) 3.8 3.5  Chloride 101 - 111 mmol/L - - -  CO2 22 - 29 mEq/L 26 24 25   Calcium 8.4 - 10.4 mg/dL 9.5 9.7 9.5  Total Protein 6.4 - 8.3 g/dL 7.4 7.8 7.9  Total Bilirubin 0.20 - 1.20 mg/dL 0.98 0.72 0.35  Alkaline Phos 40 - 150 U/L 79 71 83  AST 5 - 34 U/L 15 17 18   ALT 0 - 55 U/L  10 15 19      Pathology report Diagnosis 11/25/2015 1. Breast, simple mastectomy, right - INVASIVE DUCTAL CARCINOMA, GRADE 2/3, SPANNING 2.0 CM. - DUCTAL CARCINOMA IN SITU, INTERMEDIATE GRADE. - LYMPHOVASCULAR INVASION IS IDENTIFIED. - LOBULAR NEOPLASIA (ATYPICAL LOBULAR HYPERPLASIA). - THE SURGICAL RESECTION MARGINS ARE NEGATIVE FOR CARCINOMA. - SEE ONCOLOGY TABLE BELOW. 2. Lymph node, sentinel, biopsy, right axillary #1 - METASTATIC CARCINOMA IN 1 OF 1 LYMPH NODE (1/1). 3. Lymph node, sentinel, biopsy, right axillary #2 - THERE IS NO EVIDENCE OF CARCINOMA IN 1 OF 1 LYMPH NODE (0/1). 4. Lymph node, sentinel, biopsy, right axillary #3 - THERE IS NO EVIDENCE OF CARCINOMA IN 1 OF 1 LYMPH NODE (0/1). 5. Breast, simple mastectomy, left - BENIGN BREAST PARENCHYMA WITH DENSE STROMAL FIBROSIS. - FIBROADENOMA. - THERE IS NO EVIDENCE OF MALIGNANCY. 6. Breast, excision, left, additional lateral margin - BENIGN FIBROADIPOSE TISSUE. - THERE IS NO EVIDENCE OF MALIGNANCY. - SEE COMMENT. Microscopic Comment 1. BREAST, INVASIVE TUMOR, WITH LYMPH NODES PRESENT Specimen, including laterality and lymph node sampling (sentinel, non-sentinel): Right breast and right axillary lymph nodes Procedure: Bilateral mastectomy and multiple right axillary lymph node resections Histologic type: Ductal Grade: 2 Tubule formation: 2 1 of 4 FINAL for James, Sandra ([id number]) Microscopic Comment(continued) Nuclear pleomorphism: 2 Mitotic: 2 Tumor size (gross measurement): 2.0 cm Margins: Negative for carcinoma Invasive, distance  to closest margin: 1.8 cm to the posterior margin In-situ, distance to closest margin: 1.8 cm to the posterior margin Lymphovascular invasion: Present Ductal carcinoma in situ: Present Grade: Intermediate grade Extensive intraductal component: Not identified Lobular neoplasia: Present, atypical lobular hyperplasia Tumor focality: Unifocal Treatment effect: Not  identified Extent of tumor: Confined to breast parenchyma Lymph nodes: Examined: 3 Sentinel 0 Non-sentinel 3 Total Lymph nodes with metastasis: 1 (macrometastasis) - no extracapsular extension is identified. Breast prognostic profile: [phone] Estrogen receptor: 100%, strong staining intensity Progesterone receptor: 100%, strong staining intensity Her 2 neu: Amplification was detected. The ratio was 2.58 Ki-67: 60% TNM: pT1c, pN1a Non-neoplastic breast: No significant findings. 6. The surgical resection margin(s) of the specimen were inked and microscopically evaluated. Enid Cutter MD Pathologist, Electronic Signature (Case signed 11/29/2015)   RADIOGRAPHIC STUDIES: I have personally reviewed the radiological images as listed and agreed with the findings in the report.  Echo 01/02/2017 Impressions: - Compared to a prior study in 07/2016, the LVEF is stable however,   LV strain is worse at -15% with inferolateral strain abnormality.  Bone scan 11/13/2016 IMPRESSION: IMPRESSION: 1. No evidence for metastatic disease. 2. Lumbar spine degenerative disc disease.   CT abdomen and pelvis w contrast 11/13/2016 IMPRESSION: 1.8 cm hypervascular lesion in segment 6, unchanged. Given stability, a benign etiology such as FNH or flash filling hemangioma is favored. Metastasis is difficult to exclude but is considered less likely.  1.9 cm lesion in segment 3, compatible with a benign hemangioma.  No findings specific for metastatic disease in the abdomen. Please note that the pelvis was not imaged.   CT head w wo contrast 11/13/2016 IMPRESSION: Negative CT head with contrast.   ECHO 01/02/2017    ASSESSMENT & PLAN:  51 year old African-American female, surgical postmenopausal, history of stage I left breast cancer, with newly diagnosed right breast cancer.  1. Right breast invasive ductal carcinoma, pT1cN1aM0, stage IIB, grade 2, ER100%+/PR100%+/HER2+ -I previously  reviewed her surgical pathology results with patient in great details. She has 1 out of 3 sentinel lymph nodes positive, her pathological stage is more advanced than her initial clinical stage.  -We reviewed the natural history of triple positive breast cancer. HER-2 positive tumors are more progressive, with higher risk of recurrence -I reviewed her repeated CT and bone scan images from 11/13/2016 with pt in detail, which are negative for metastatic disease. She has 2 small liver lesions, likely benign, unchanged. Unfortunately she is not able to do liver MRI, due to her breast implants.  -She has completed adjuvant chemotherapy TCHP, Herceptin and pejeta were held for the last 2 cycle due to her decreased EF. -Her repeated echo has showed normal EF, she will continue Herceptin maintenance therapy. -She  has completed adjuvant breast and axilla radiation and tolerated well. -She previously could not tolerate letrozole and it was switched to exemestane, she tolerates it well lately, will continue for 5-7 years.  -She still has moderate hot flash, tolerable. She tried Effexor, did not help. -we'll continue Herceptin maintenance therapy until March/April 2018  -I will recommend neratinib, a recently FDA approved oral her2 antibody, after she competes herceptin.  -Continue breast cancer surveillance  2. Genetics -Due to her young age and recurrent breast cancer, she was referred to see a genetic counselor in our cancer center. -Her genetic test was negative  3. DM -she will continue follow-up with her primary care physician -She is on metformin  4. Bone health -She is postmenopausal by surgery -patient taking  calcium and vitamin d - her bone density scan from December 2017 was normal  6. Insomnia and low appetite  - improved some, continue mirtazapine -see below  7. Headaches - probably secondary to her cervical spine degenerative arthritis -Improved after steroids injection -CT head was  negative   8. Hot flush -Secondary to menopause and exemestane, improved, manageable- -she tried Effexor, didn't feel helpful  Plan for today  -Proceed with Herceptin today -I am concerned about her use of Ambien and the fact that she is taking 26m to fall asleep.  We discussed sleep hygeine, use of a white noise machine, drinking tea, and other non-pharmacologic methods to help with sleep.  I suggested she take Bisoprolol in the morning to see if it is possibly keeping her up at night since it was started near the time that the insomnia did.  I prescribed Trazodone instead of Ambien for her to try since it appears the Ambien is no longer working.   -Patient to return on 02/14/17 for herceptin only and on 03/07/17 for lab, appointment with Dr. FBurr Medico and her last Herceptin.    Azaryah verbalized understanding of the plan, and is in agreement with it.  She doesn't have any questions or concerns.  She knows to call between now and her next appointment for any questions or concerns.    A total of (30) minutes of face-to-face time was spent with this patient with greater than 50% of that time in counseling and care-coordination.     LCharlestine Massed NP 01/24/2017

## 2017-01-26 ENCOUNTER — Telehealth: Payer: Self-pay | Admitting: *Deleted

## 2017-01-26 NOTE — Telephone Encounter (Signed)
ERROR

## 2017-01-26 NOTE — Telephone Encounter (Signed)
Has patient tried not taking the Bisoprolol at night and taking in the morning instead?  OK to stay on 150mg  QHS, but that is the max dose for insomnia.  Thanks,   Mendel Ryder

## 2017-01-26 NOTE — Telephone Encounter (Signed)
Spoke with pt and was informed that pt has not been able to sleep for past 2 days.  Stated she was prescribed Trazodone 50 mg for sleep on Wed 2/14 at last office visit .  Script instruction that pt could take 100 mg as needed for sleep.  Per pt, she had been taking  150 mg since Wed 01/24/17  With  No  Results.  Pt wanted to know what else NP could recommend to help her sleep.   Message routed to [place], NP Pt's   Phone     [phone].

## 2017-01-26 NOTE — Telephone Encounter (Signed)
Spoke with pt and asked if pt takes Bisoprolol in the morning as instructed by NP.  Pt stated she has not; stated she will start taking this med in the am.  Informed pt that per NP,  OK to take Trazodone 150 mg every bedtime, but that is the max dose for insomnia.  Pt voiced understanding.

## 2017-01-28 ENCOUNTER — Telehealth: Payer: Self-pay

## 2017-01-28 NOTE — Telephone Encounter (Signed)
Spoke with patient and she is aware of her new appts  Sandra James 

## 2017-02-12 ENCOUNTER — Telehealth: Payer: Self-pay | Admitting: *Deleted

## 2017-02-12 NOTE — Telephone Encounter (Signed)
Patient called stating that she has been experiencing bone/achy bone pain since Friday. Patient denies fever or cold like symptoms. Patient started aromasin last year.

## 2017-02-12 NOTE — Telephone Encounter (Signed)
Spoke with pt and informed pt re:  Per Dr. Burr Medico, pt can take Tylenol to help with the pain.  Encouraged pt to do mild exercises as tolerated; stopped Aromasin today 3/5 and  3/6.  Dr. Burr Medico will see pt in infusion room on 3/7 when pt comes in for Herceptin.  Pt voiced understanding.  Schedule message sent.

## 2017-02-13 NOTE — Progress Notes (Signed)
[place]  [phone] [phone]  Clinic follow Up Note   Patient Care Team: Kristie Cowman, MD as PCP - General (Family Medicine) Stark Klein, MD as Consulting Physician (General Surgery) Truitt Merle, MD as Consulting Physician (Hematology) Arloa Koh, MD as Consulting Physician (Radiation Oncology) Mauro Kaufmann, RN as Registered Nurse Rockwell Germany, RN as Registered Nurse Sylvan Cheese, NP as Nurse Practitioner (Nurse Practitioner) 02/14/2017  CHIEF COMPLAINTS:  Follow up right breast cancer  Oncology History   Breast cancer [facility])   Staging form: Breast, AJCC 7th Edition     Pathologic stage from 11/25/2015: Stage IIA (T1c, N1a, cM0) - Unsigned     Pathologic stage from 11/25/2015: Stage IIA (T1c, N1a, cM0) - Unsigned Breast cancer of upper-outer quadrant of right female breast [facility])   Staging form: Breast, AJCC 7th Edition     Clinical stage from 09/15/2015: Stage IA (T1b, N0, M0) - Signed by Truitt Merle, MD on 12/16/2015     Pathologic stage from 11/25/2015: Stage IIA (T1c, N1a, cM0) - Signed by Truitt Merle, MD on 12/16/2015         Breast cancer of upper-outer quadrant of right female breast ([place])   09/07/2015 Mammogram    Diagnostic mammogram and the ultrasound of the right breast showed a 0.8 cm lobulated mass in the right breast 11 to 12:00 position 9 cm from the nipple. No other lesions or adenopathy.      09/08/2015 Initial Diagnosis    Breast cancer of upper-outer quadrant of right female breast ([place])      09/08/2015 Initial Biopsy    Right breast mass at the upper outer quadrant core needle biopsy showed invasive ductal carcinoma, grade 2-3, ductal carcinoma in situ.      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 Left breast ostectomy was negative for malignancy.      01/06/2016 - 04/20/2016 Chemotherapy    adjuvant chemotherapy TCH P, every 3 weeks, for a total of 6 cycles. Herceptin and Perjeta were held for last two cycles due to drop of her EF on echo       05/25/2016 - 07/11/2016 Radiation Therapy    Adjuvant breast radiation      06/08/2016 -  Chemotherapy    She started maintenance Herceptin every 3 weeks, after clearance from cardiology.      08/11/2016 -  Anti-estrogen oral therapy    Exemestane 25 mg daily      11/13/2016 Imaging    Bone Scan IMPRESSION: 1. No evidence for metastatic disease. 2. Lumbar spine degenerative disc disease.      11/13/2016 Imaging    CT Abdomen W Contrast IMPRESSION: 1.8 cm hypervascular lesion in segment 6, unchanged. Given stability, a benign etiology such as FNH or flash filling hemangioma is favored. Metastasis is difficult to exclude but is considered less likely. 1.9 cm lesion in segment 3, compatible with a benign hemangioma. No findings specific for metastatic disease in the abdomen. Please note that the pelvis was not imaged.      11/13/2016 Imaging    CT  Head W Contrast IMPRESSION: Negative CT head with contrast.      01/02/2017 Imaging    Echo 01/02/2017 Impressions: - Compared to a prior study in 07/2016, the LVEF is stable however,   LV strain is worse at -15% with inferolateral strain abnormality.       HISTORY OF PRESENTING ILLNESS (09/15/15):  Sandra James 51 y.o. female with past medical history of stage I left breast cancer, is here because of newly diagnosed right breast cancer. She presents to our multidisciplinary breast clinic by herself.  The right breast cancer was discovered by screening mammogram. She did not have any palpable mass, or any constitutional symptoms. She  was diagnosed with stage I left breast cancer at age of 13, she had lumpectomy, radiation, and adjuvant chemotherapy and 5 years of tamoxifen. She was treated by Dr. Louann Sjogren in [place]. She moved to [facility] about year ago due to job change. She has been very compliant with annual screening mammogram.  She had hysterectomy and bilateral oophorectomy and sphincterectomy 5 years ago for heavy bleeding.. She has been having hot flashes since then, moderate, but manageable. She is single, lives alone, works for 2 to [place] has to go up children who live in [place].  CURRENT THERAPY:  1. maintenance Herceptin every 3 weeks, until March 2018  2. Exemestane 25 mg daily, started on 08/11/2016  [place] returns for follow-up and herceptin treatment. The patient called previously and reported she was experiencing sore joints and feet. She reports her feet hurt like she had been "walking for hours." The pain lasted 2 days, and now is relieved. She denies any foot swelling or skin changes. She stopped Exemestane for two days, and then restarted when her symptoms resolved.   MED ICAL HISTORY:  Past Medical History:  Diagnosis Date  . Allergy   . Anxiety   . Breast cancer ([place]) 09/07/15   right breast  . Breast cancer of upper-outer quadrant of right female breast ([place]) 09/09/2015  . Cancer [facility])    left brast lumpectomy   . Complication of anesthesia    slow to wake up- BP was low, fainted next day-  . Depression   . Diabetes mellitus without complication ([place])   . Fibroid   . GERD (gastroesophageal reflux disease)   . Infection    UTI  . MVP (mitral valve prolapse)   . Neuromuscular disorder ([place])    sciatic nerve paion left side/leg  . UTI (lower urinary tract infection)     SURGICAL HISTORY: Past Surgical History:  Procedure Laterality Date  . ABDOMINAL HYSTERECTOMY    . BREAST RECONSTRUCTION WITH PLACEMENT OF TISSUE EXPANDER AND FLEX HD (ACELLULAR HYDRATED  DERMIS) Bilateral 11/25/2015   Procedure: BILATERAL IMMEDIATE BREAST RECONSTRUCTION WITH PLACEMENT OF TISSUE EXPANDERS AND FLEX HD (ACELLULAR HYDRATED DERMIS);  Surgeon: Wallace Going, DO;  Location: [place];  Service: Plastics;  Laterality: Bilateral;  . BREAST SURGERY  2004   left lumpectomy  . MASTECTOMY W/ SENTINEL NODE BIOPSY Bilateral 11/25/2015   Procedure: BILATERAL SKIN SPARING MASTECTOMIES WITH RIGHT SENTINEL LYMPH NODE BIOPSY;  Surgeon: Stark Klein, MD;  Location: [place];  Service: General;  Laterality: Bilateral;  . OOPHORECTOMY Bilateral   . PORTACATH PLACEMENT Left 12/20/2015   Procedure: INSERTION PORT-A-CATH, left chest;  Surgeon: Stark Klein, MD;  Location: [place];  Service: General;  Laterality: Left;    SOCIAL HISTORY: Social History   Social History  .  Marital Status: Single     Spouse Name: N/A  . Number of Children: 2 children, 29 daughter and 79 yo son    . Years of Education: N/A   Occupational History  . She is a Barista rep   Social History Main Topics  . Smoking status: Never Smoker   . Smokeless tobacco: Never Used  . Alcohol Use: Yes     Comment: 2-3/wk  . Drug Use: No  . Sexual Activity: Yes    Birth Control/ Protection: Surgical   Other Topics Concern  . Not on file   Social History Narrative    FAMILY HISTORY: Family History  Problem Relation Age of Onset  . Hypertension Mother   . Stroke Mother   . Alzheimer's disease Mother   . Heart disease Father   . Stroke Father   . Heart attack Father   . Breast cancer Sister 104    double mastectomy  . Other Sister     "stomach tumor that wrapped around reproductive organs"; dx. 81s; required partial hysterectomy  . Alzheimer's disease Maternal Grandmother   . Alzheimer's disease Paternal Grandmother   . Other Other     had to have lymph nodes removed and radiation; dx. 21s  . Cancer Maternal Aunt     unspecified type; dx. <50  . Colon cancer  Maternal Uncle     dx. 66s  . Breast cancer Cousin     dx. 43s    ALLERGIES:  is allergic to lisinopril; oxycodone; and percocet [oxycodone-acetaminophen].  MEDICATIONS:  Current Outpatient Prescriptions  Medication Sig Dispense Refill  . bisoprolol (ZEBETA) 5 MG tablet Take 0.5 tablets (2.5 mg total) by mouth at bedtime. 15 tablet 3  . exemestane (AROMASIN) 25 MG tablet TAKE 1 TABLET BY MOUTH DAILY AFTER BREAKFAST. 30 tablet 0  . fluticasone (FLONASE) 50 MCG/ACT nasal spray Place 1 spray into both nostrils daily as needed for allergies.   0  . ibuprofen (ADVIL,MOTRIN) 800 MG tablet Take 800 mg by mouth daily as needed for headache or moderate pain.    . Iron-Vitamins (GERITOL) LIQD Take 30 mLs by mouth daily. Energy support    . losartan (COZAAR) 25 MG tablet Take 0.5 tablets (12.5 mg total) by mouth daily. 45 tablet 3  . traZODone (DESYREL) 50 MG tablet Take 1 tablet (50 mg total) by mouth at bedtime. May increase to 163m QHS if needed 60 tablet 0  . zolpidem (AMBIEN CR) 12.5 MG CR tablet Take 1 tablet (12.5 mg total) by mouth at bedtime as needed for sleep. 30 tablet 0   No current facility-administered medications for this visit.    Facility-Administered Medications Ordered in Other Visits  Medication Dose Route Frequency Provider Last Rate Last Dose  . heparin lock flush 100 unit/mL  500 Units Intracatheter Once PRN YTruitt Merle MD      . sodium chloride flush (NS) 0.9 % injection 10 mL  10 mL Intracatheter PRN YTruitt Merle MD      . trastuzumab (HERCEPTIN) 378 mg in sodium chloride 0.9 % 250 mL chemo infusion  6 mg/kg (Treatment Plan Adjusted) Intravenous Once YTruitt Merle MD 536 mL/hr at 02/14/17 1103 378 mg at 02/14/17 1103    REVIEW OF SYSTEMS:  Constitutional: Denies fevers, chills or abnormal night sweats (+) joint pain in feet Eyes: Denies blurriness of vision, double vision or watery eyes Ears, nose, mouth, throat, and face: Denies mucositis or sore throat Respiratory: Denies  cough, dyspnea or wheezes Cardiovascular: Denies  palpitation, chest discomfort or lower extremity swelling Gastrointestinal:  Denies nausea, heartburn or change in bowel habits Skin: Denies abnormal skin rashes/changes Lymphatics: Denies new lymphadenopathy or easy bruising, denied bilateral foot edema Extremities: (+) joint pain in bilateral feet Neurological:Denies numbness, tingling or new weaknesses Behavioral/Psych: Mood is stable, no new changes  All other systems were reviewed with the patient and are negative.  PHYSICAL EXAMINATION: ECOG PERFORMANCE STATUS: 1  Vitals with BMI 02/14/2017  Height   Weight   BMI   Systolic 220  Diastolic 77  Pulse 83  Respirations 17   GENERAL:alert, no distress and comfortable SKIN: skin color, texture, turgor are normal, no rashes or significant lesions EYES: normal, conjunctiva are pink and non-injected, sclera clear, eye lids appears normal OROPHARYNX:no exudate, no erythema and lips, buccal mucosa, and tongue normal  NECK: supple, thyroid normal size, non-tender, without nodularity LYMPH:  no palpable lymphadenopathy in the cervical, axillary or inguinal LUNGS: clear to auscultation and percussion with normal breathing effort HEART: regular rate & rhythm and no murmurs and no lower extremity edema ABDOMEN:abdomen soft, non-tender and normal bowel sounds Musculoskeletal:no cyanosis of digits and no clubbing  PSYCH: alert & oriented x 3 with fluent speech NEURO: no focal motor/sensory deficits Breasts: s/p bilateral mastectomy and tissue spender placement. Surgical incision sites are clean and well healed.    LABORATORY DATA:  I have reviewed the data as listed CBC Latest Ref Rng & Units 02/14/2017 01/03/2017 11/22/2016  WBC 3.9 - 10.3 10e3/uL 5.2 4.6 4.9  Hemoglobin 11.6 - 15.9 g/dL 11.8 11.7 12.1  Hematocrit 34.8 - 46.6 % 34.8 34.3(L) 36.5  Platelets 145 - 400 10e3/uL 273 304 295    CMP Latest Ref Rng & Units 02/14/2017 01/03/2017  11/22/2016  Glucose 70 - 140 mg/dl 121 92 88  BUN 7.0 - 26.0 mg/dL 9.3 12.0 7.9  Creatinine 0.6 - 1.1 mg/dL 0.8 0.7 0.7  Sodium 136 - 145 mEq/L 141 140 140  Potassium 3.5 - 5.1 mEq/L 3.5 3.4(L) 3.8  Chloride 101 - 111 mmol/L - - -  CO2 22 - 29 mEq/L _0 Calcium 8.4 - 10.4 mg/dL 9.5 9.5 9.7  Total Protein 6.4 - 8.3 g/dL 7.3 7.4 7.8  Total Bilirubin 0.20 - 1.20 mg/dL 0.72 0.98 0.72  Alkaline Phos 40 - 150 U/L 78 79 71  AST 5 - 34 U/L _1 ALT 0 - 55 U/L _2 Pathology report Diagnosis 11/25/2015 1. Breast, simple mastectomy, right - INVASIVE DUCTAL CARCINOMA, GRADE 2/3, SPANNING 2.0 CM. - DUCTAL CARCINOMA IN SITU, INTERMEDIATE GRADE. - LYMPHOVASCULAR INVASION IS IDENTIFIED. - LOBULAR NEOPLASIA (ATYPICAL LOBULAR HYPERPLASIA). - THE SURGICAL RESECTION MARGINS ARE NEGATIVE FOR CARCINOMA. - SEE ONCOLOGY TABLE BELOW. 2. Lymph node, sentinel, biopsy, right axillary #1 - METASTATIC CARCINOMA IN 1 OF 1 LYMPH NODE (1/1). 3. Lymph node, sentinel, biopsy, right axillary #2 - THERE IS NO EVIDENCE OF CARCINOMA IN 1 OF 1 LYMPH NODE (0/1). 4. Lymph node, sentinel, biopsy, right axillary #3 - THERE IS NO EVIDENCE OF CARCINOMA IN 1 OF 1 LYMPH NODE (0/1). 5. Breast, simple mastectomy, left - BENIGN BREAST PARENCHYMA WITH DENSE STROMAL FIBROSIS. - FIBROADENOMA. - THERE IS NO EVIDENCE OF MALIGNANCY. 6. Breast, excision, left, additional lateral margin - BENIGN FIBROADIPOSE TISSUE. - THERE IS NO EVIDENCE OF MALIGNANCY. - SEE COMMENT.  RADIOGRAPHIC STUDIES: I have personally reviewed the radiological images as listed and agreed with the findings in the report.  Echo 01/02/2017 Impressions: - Compared to a prior study in 07/2016, the LVEF is stable however,   LV strain is worse at -15% with inferolateral strain abnormality.  Bone scan 11/13/2016 IMPRESSION: IMPRESSION: 1. No evidence for metastatic disease. 2. Lumbar spine degenerative disc disease.   CT abdomen and  pelvis w contrast 11/13/2016 IMPRESSION: 1.8 cm hypervascular lesion in segment 6, unchanged. Given stability, a benign etiology such as FNH or flash filling hemangioma is favored. Metastasis is difficult to exclude but is considered less likely. 1.9 cm lesion in segment 3, compatible with a benign hemangioma. No findings specific for metastatic disease in the abdomen. Please note that the pelvis was not imaged.   CT head w wo contrast 11/13/2016 IMPRESSION: Negative CT head with contrast.   ECHO 08/02/2016 Impressions: - Normal LV size with EF 55%. Strain as noted above. Normal RV size   and systolic function.  ASSESSMENT & PLAN:  51 y.o. African-American female, surgical postmenopausal, history of stage I left breast cancer, with newly diagnosed right breast cancer.  1. Right breast invasive ductal carcinoma, pT1cN1aM0, stage IIB, grade 2, ER100%+/PR100%+/HER2+ -I previously reviewed her surgical pathology results with patient in great details. She has 1 out of 3 sentinel lymph nodes positive, her pathological stage is more advanced than her initial clinical stage.  -We previously reviewed the natural history of triple positive breast cancer. HER-2 positive tumors are more progressive, with higher risk of recurrence -I previously reviewed her repeated CT and bone scan images from 11/13/2016 with pt in detail, which are negative for metastatic disease. She has 2 small liver lesions, likely benign, unchanged. Unfortunately she is not able to do liver MRI, due to her breast implants.  -She has completed adjuvant chemotherapy TCHP, Herceptin and pejeta were held for the last 2 cycle due to her decreased EF. -Her repeated echo has showed normal EF, she will continue Herceptin maintenance therapy -She  has completed adjuvant breast and axilla radiation and tolerated well. -She previously could not tolerate letrozole and it was switched to exemestane, she tolerates it well, though she reports some  joint pain. Will continue for 5-7 years.  -she developed significant bilateral pain in the feet and hips last week, resolved after she stopped her exemestane. Not sure if it is related to exemestane, she is waiting to restart exemestane and watch her joint pain. -We discussed alternative and adjuvant therapy of tamoxifen, if she develops more joint pain from exemestane. She previous take tamoxifen for her left breast cancer, tolerating well. Potential side effects reviewed with her again, she has had hysterectomy, no risk of endometrial cancer. She agrees to change to tamoxifen if she has more side effects from exemestane. -She still has moderate hot flash, tolerable. She tried Effexor, did not help. -we'll continue Herceptin maintenance therapy, he has 1 more treatment. -I have previously recommended neratinib, a recently FDA approved oral her2 antibody, after she competes herceptin.  -Continue breast cancer surveillance   2. Genetics -Due to her young age and recurrent breast cancer, she was referred to see a genetic counselor in our cancer center. -Her genetic test was negative  3. DM -she will continue follow-up with her primary care physician -She is on metformin  4. Bone health -She is postmenopausal by surgery -I have encouraged her to take calcium and vitamin D - her bone density scan from December 2017 was normal, I previously discussed with her   6. Insomnia and low appetite  - improved some, continue mirtazapine. -  she has not been able to wean off Lorrin Mais, will continue for now. -The patient reports she stopped taking Ambien and was prescribed Trazadone by Charlestine Massed, NP, but it did not work. She will continue Ambien at this time.  7. Headaches - probably secondary to her cervical spine degenerative arthritis -Improved after steroids injection -Previous CT head was negative   8. Hot flush -Secondary to menopause and exemestane, improved, manageable- -she tried  Effexor, didn't feel helpful  9. Joint Pain -The patient has experienced bilateral foot pain for 2 days which is now resolved. -We discussed that Exemestane could cause the patient joint discomfort. -If her discomfort returns we may change the patient's medication to Tamoxifen. We discussed the side effects of Tamoxifen today. -The patient will contact me if she feels poorly again. -I encouraged the patient to get regular exercise as she is able in order to reduce discomfort.  Plan for today  - I will prescribe Ambien CR 12.78m for the patient at this time. I will also refill exemestane. -Continue exemestane and Herceptin. Last Herception in 3 weeks  -She will follow up with me in 3 weeks for final treatment.    All questions were answered. The patient knows to call the clinic with any problems, questions or concerns.  I spent 25 minutes counseling the patient face to face. The total time spent in the appointment was 30 minutes and more than 50% was on counseling.  This document serves as a record of services personally performed by YTruitt Merle MD. It was created on her behalf by SMaryla Morrow a trained medical scribe. The creation of this record is based on the scribe's personal observations and the provider's statements to them. This document has been checked and approved by the attending provider.    FTruitt Merle MD 02/14/2017

## 2017-02-14 ENCOUNTER — Ambulatory Visit (HOSPITAL_BASED_OUTPATIENT_CLINIC_OR_DEPARTMENT_OTHER): Payer: BLUE CROSS/BLUE SHIELD | Admitting: Hematology

## 2017-02-14 ENCOUNTER — Encounter: Payer: Self-pay | Admitting: Hematology

## 2017-02-14 ENCOUNTER — Ambulatory Visit: Payer: BLUE CROSS/BLUE SHIELD

## 2017-02-14 ENCOUNTER — Ambulatory Visit (HOSPITAL_BASED_OUTPATIENT_CLINIC_OR_DEPARTMENT_OTHER): Payer: BLUE CROSS/BLUE SHIELD

## 2017-02-14 ENCOUNTER — Other Ambulatory Visit (HOSPITAL_BASED_OUTPATIENT_CLINIC_OR_DEPARTMENT_OTHER): Payer: BLUE CROSS/BLUE SHIELD

## 2017-02-14 VITALS — BP 111/77 | HR 83 | Temp 97.7°F | Resp 17

## 2017-02-14 DIAGNOSIS — Z170 Estrogen receptor positive status [ER+]: Secondary | ICD-10-CM | POA: Diagnosis not present

## 2017-02-14 DIAGNOSIS — C50411 Malignant neoplasm of upper-outer quadrant of right female breast: Secondary | ICD-10-CM

## 2017-02-14 DIAGNOSIS — E119 Type 2 diabetes mellitus without complications: Secondary | ICD-10-CM | POA: Diagnosis not present

## 2017-02-14 DIAGNOSIS — Z5112 Encounter for antineoplastic immunotherapy: Secondary | ICD-10-CM | POA: Diagnosis not present

## 2017-02-14 LAB — COMPREHENSIVE METABOLIC PANEL
ALBUMIN: 4 g/dL (ref 3.5–5.0)
ALT: 10 U/L (ref 0–55)
AST: 13 U/L (ref 5–34)
Alkaline Phosphatase: 78 U/L (ref 40–150)
Anion Gap: 8 mEq/L (ref 3–11)
BUN: 9.3 mg/dL (ref 7.0–26.0)
CALCIUM: 9.5 mg/dL (ref 8.4–10.4)
CO2: 28 mEq/L (ref 22–29)
CREATININE: 0.8 mg/dL (ref 0.6–1.1)
Chloride: 105 mEq/L (ref 98–109)
EGFR: >90 mL/min/{1.73_m2} (ref >90)
Glucose: 121 mg/dl (ref 70–140)
Potassium: 3.5 mEq/L (ref 3.5–5.1)
Sodium: 141 mEq/L (ref 136–145)
Total Bilirubin: 0.72 mg/dL (ref 0.20–1.20)
Total Protein: 7.3 g/dL (ref 6.4–8.3)

## 2017-02-14 LAB — CBC WITH DIFFERENTIAL/PLATELET
BASO%: 1 % (ref 0.0–2.0)
Basophils Absolute: 0.1 10*3/uL (ref 0.0–0.1)
EOS%: 2.4 % (ref 0.0–7.0)
Eosinophils Absolute: 0.1 10*3/uL (ref 0.0–0.5)
HCT: 34.8 % (ref 34.8–46.6)
HEMOGLOBIN: 11.8 g/dL (ref 11.6–15.9)
LYMPH#: 1.1 10*3/uL (ref 0.9–3.3)
LYMPH%: 21.3 % (ref 14.0–49.7)
MCH: 31.2 pg (ref 25.1–34.0)
MCHC: 34 g/dL (ref 31.5–36.0)
MCV: 91.9 fL (ref 79.5–101.0)
MONO#: 0.4 10*3/uL (ref 0.1–0.9)
MONO%: 6.9 % (ref 0.0–14.0)
NEUT#: 3.6 10*3/uL (ref 1.5–6.5)
NEUT%: 68.4 % (ref 38.4–76.8)
Platelets: 273 10*3/uL (ref 145–400)
RBC: 3.79 10*6/uL (ref 3.70–5.45)
RDW: 13.8 % (ref 11.2–14.5)
WBC: 5.2 10*3/uL (ref 3.9–10.3)

## 2017-02-14 MED ORDER — SODIUM CHLORIDE 0.9 % IJ SOLN
10.0000 mL | INTRAMUSCULAR | Status: DC | PRN
  Administered 2017-02-14: 10 mL
  Filled 2017-02-14: qty 10

## 2017-02-14 MED ORDER — ACETAMINOPHEN 325 MG PO TABS
650.0000 mg | ORAL_TABLET | Freq: Once | ORAL | Status: AC
  Administered 2017-02-14: 650 mg via ORAL

## 2017-02-14 MED ORDER — DIPHENHYDRAMINE HCL 25 MG PO CAPS
50.0000 mg | ORAL_CAPSULE | Freq: Once | ORAL | Status: AC
  Administered 2017-02-14: 50 mg via ORAL

## 2017-02-14 MED ORDER — DIPHENHYDRAMINE HCL 25 MG PO CAPS
ORAL_CAPSULE | ORAL | Status: AC
  Filled 2017-02-14: qty 2

## 2017-02-14 MED ORDER — EXEMESTANE 25 MG PO TABS: ORAL_TABLET | ORAL | 0 refills | Status: DC

## 2017-02-14 MED ORDER — ZOLPIDEM TARTRATE ER 12.5 MG PO TBCR: 12.5000 mg | EXTENDED_RELEASE_TABLET | Freq: Every evening | ORAL | 0 refills | Status: DC | PRN

## 2017-02-14 MED ORDER — SODIUM CHLORIDE 0.9 % IV SOLN
Freq: Once | INTRAVENOUS | Status: AC
  Administered 2017-02-14: 10:00:00 via INTRAVENOUS

## 2017-02-14 MED ORDER — TRASTUZUMAB CHEMO 150 MG IV SOLR
6.0000 mg/kg | Freq: Once | INTRAVENOUS | Status: AC
  Administered 2017-02-14: 378 mg via INTRAVENOUS
  Filled 2017-02-14: qty 18

## 2017-02-14 MED ORDER — SODIUM CHLORIDE 0.9% FLUSH
10.0000 mL | INTRAVENOUS | Status: DC | PRN
  Administered 2017-02-14: 10 mL
  Filled 2017-02-14: qty 10

## 2017-02-14 MED ORDER — HEPARIN SOD (PORK) LOCK FLUSH 100 UNIT/ML IV SOLN
500.0000 [IU] | Freq: Once | INTRAVENOUS | Status: AC | PRN
  Administered 2017-02-14: 500 [IU]
  Filled 2017-02-14: qty 5

## 2017-02-14 MED ORDER — ACETAMINOPHEN 325 MG PO TABS
ORAL_TABLET | ORAL | Status: AC
  Filled 2017-02-14: qty 2

## 2017-02-14 MED FILL — EXEMESTANE 25 MG TABLET: 25 | 30 days supply | Qty: 30 | Fill #0

## 2017-02-14 MED FILL — ZOLPIDEM TART ER 12.5 MG TA: 12.5 | 30 days supply | Qty: 30 | Fill #0

## 2017-02-14 NOTE — Patient Instructions (Signed)
[facility] Discharge Instructions for Patients Receiving Chemotherapy  Today you received the following chemotherapy agents: Herceptin   To help prevent nausea and vomiting after your treatment, we encourage you to take your nausea medication as directed.    If you develop nausea and vomiting that is not controlled by your nausea medication, call the clinic.   BELOW ARE SYMPTOMS THAT SHOULD BE REPORTED IMMEDIATELY:  *FEVER GREATER THAN 100.5 F  *CHILLS WITH OR WITHOUT FEVER  NAUSEA AND VOMITING THAT IS NOT CONTROLLED WITH YOUR NAUSEA MEDICATION  *UNUSUAL SHORTNESS OF BREATH  *UNUSUAL BRUISING OR BLEEDING  TENDERNESS IN MOUTH AND THROAT WITH OR WITHOUT PRESENCE OF ULCERS  *URINARY PROBLEMS  *BOWEL PROBLEMS  UNUSUAL RASH Items with * indicate a potential emergency and should be followed up as soon as possible.  Feel free to call the clinic you have any questions or concerns. The clinic phone number is [phone].  Please show the CHEMO ALERT CARD at check-in to the Emergency Department and triage nurse.   

## 2017-02-14 NOTE — Progress Notes (Signed)
Dr. Burr Medico here to see patient.

## 2017-03-06 NOTE — Progress Notes (Signed)
[place]  [phone] [phone]  Clinic follow Up Note   Patient Care Team: Kristie Cowman, MD as PCP - General (Family Medicine) Stark Klein, MD as Consulting Physician (General Surgery) Truitt Merle, MD as Consulting Physician (Hematology) Arloa Koh, MD as Consulting Physician (Radiation Oncology) Mauro Kaufmann, RN as Registered Nurse Rockwell Germany, RN as Registered Nurse Sylvan Cheese, NP as Nurse Practitioner (Nurse Practitioner) 03/08/2017  CHIEF COMPLAINTS:  Follow up right breast cancer  Oncology History   Cancer Staging Breast cancer of upper-outer quadrant of right female breast [facility]) Staging form: Breast, AJCC 7th Edition - Clinical stage from 09/15/2015: Stage IA (T1b, N0, M0) - Signed by Truitt Merle, MD on 12/16/2015 - Pathologic stage from 11/25/2015: Stage IIA (T1c, N1a, cM0) - Signed by Truitt Merle, MD on 12/16/2015       Breast cancer of upper-outer quadrant of right female breast ([place])   09/07/2015 Mammogram    Diagnostic mammogram and the ultrasound of the right breast showed a 0.8 cm lobulated mass in the right breast 11 to 12:00 position 9 cm from the nipple. No other lesions or adenopathy.      09/08/2015 Initial Diagnosis    Breast cancer of upper-outer quadrant of right female breast ([place])      09/08/2015 Initial Biopsy    Right breast mass at the upper outer quadrant core needle biopsy showed invasive ductal carcinoma, grade 2-3, ductal carcinoma in situ.      09/08/2015 Receptors her2    ER 100% positive, PR 100% positive, Ki-67 60%, HER-2 positive with copy # 4.25 and ratio 2.58      11/25/2015 Surgery    Right breast mastectomy and sentinel lymph node biopsy      11/25/2015 Pathology Results    Right breast invasive ductal carcinoma, grade 2, 2.0 cm, DCIS, intermediate grade, (+) LVI, margins were negative, 1 out of 3 lymph nodes were negative. Left breast ostectomy was negative for malignancy.      01/06/2016 - 04/20/2016 Chemotherapy    adjuvant chemotherapy TCH P, every 3 weeks, for a total of 6 cycles. Herceptin and Perjeta were held for last two cycles due to drop of her EF on echo       05/25/2016 - 07/11/2016 Radiation Therapy    Adjuvant breast radiation      06/08/2016 -  Chemotherapy    She started maintenance Herceptin every 3 weeks, after clearance from cardiology.      08/11/2016 -  Anti-estrogen oral therapy    Exemestane 25 mg daily      11/13/2016 Imaging    Bone Scan IMPRESSION: 1. No evidence for metastatic disease. 2. Lumbar spine degenerative disc disease.      11/13/2016 Imaging    CT Abdomen W Contrast IMPRESSION: 1.8 cm hypervascular lesion in segment 6, unchanged. Given stability, a benign etiology such as FNH or flash filling hemangioma is favored. Metastasis is difficult to exclude but is considered less likely. 1.9 cm lesion in segment 3, compatible with a benign hemangioma. No findings specific for metastatic disease in the abdomen. Please note that the pelvis was not imaged.      11/13/2016 Imaging    CT Head W Contrast IMPRESSION: Negative CT head with contrast.      01/02/2017 Imaging    Echo 01/02/2017 Impressions: - Compared to a prior study in 07/2016, the LVEF is stable however,   LV strain is worse at -15% with inferolateral strain abnormality.  HISTORY OF PRESENTING ILLNESS:  Sandra James 51 y.o. female with past medical history of stage I left breast cancer, is here because of newly diagnosed right breast cancer. She presents to our multidisciplinary breast clinic by herself.  The right breast cancer was discovered by screening mammogram. She did not have any palpable mass, or any constitutional symptoms. She was diagnosed with stage I left breast cancer at age of 15, she had lumpectomy, radiation, and adjuvant chemotherapy and 5 years of tamoxifen. She was treated by Dr. Louann Sjogren in [place]. She moved to [facility] about year ago due to job change. She has been very compliant with annual screening mammogram.  She had hysterectomy and bilateral oophorectomy and sphincterectomy 5 years ago for heavy bleeding.. She has been having hot flashes since then, moderate, but manageable. She is single, lives alone, works for 2 to [place] has to go up children who live in [place].  CURRENT THERAPY:  1. maintenance Herceptin every 3 weeks, until March 2018  2. Exemestane 25 mg daily, started on 08/11/2016  Page returns for follow-up and last dosage of herceptin treatment, cycle 14. She says she is tired today and doesn't have much energy. The feeling comes and goes. She got 4 hours of sleep last night due to staying up to watch TV. She denies any pain or other issues. She experiences back pains and says she can't pick things up. She wants to try yoga to stretch it out. She has been more anxious and agitated. She experiences extreme leg cramps in the left leg that has been happening once a week.  MED ICAL HISTORY:  Past Medical History:  Diagnosis Date  . Allergy   . Anxiety   . Breast cancer ([place]) 09/07/15   right breast  . Breast cancer of upper-outer quadrant of right female breast ([place]) 09/09/2015  . Cancer [facility])    left brast lumpectomy   . Complication of anesthesia    slow to wake up- BP was low, fainted next day-  . Depression   . Diabetes mellitus without complication ([place])   . Fibroid   . GERD (gastroesophageal reflux disease)   . Infection    UTI  . MVP (mitral valve prolapse)   . Neuromuscular disorder ([place])    sciatic nerve paion left side/leg  . UTI (lower urinary tract infection)     SURGICAL HISTORY: Past Surgical History:  Procedure Laterality Date  . ABDOMINAL HYSTERECTOMY    . BREAST RECONSTRUCTION WITH PLACEMENT OF TISSUE EXPANDER AND FLEX HD (ACELLULAR HYDRATED DERMIS) Bilateral 11/25/2015   Procedure: BILATERAL IMMEDIATE BREAST RECONSTRUCTION WITH PLACEMENT  OF TISSUE EXPANDERS AND FLEX HD (ACELLULAR HYDRATED DERMIS);  Surgeon: Wallace Going, DO;  Location: [place];  Service: Plastics;  Laterality: Bilateral;  . BREAST SURGERY  2004   left lumpectomy  . MASTECTOMY W/ SENTINEL NODE BIOPSY Bilateral 11/25/2015   Procedure: BILATERAL SKIN SPARING MASTECTOMIES WITH RIGHT SENTINEL LYMPH NODE BIOPSY;  Surgeon: Stark Klein, MD;  Location: [place];  Service: General;  Laterality: Bilateral;  . OOPHORECTOMY Bilateral   . PORTACATH PLACEMENT Left 12/20/2015   Procedure: INSERTION PORT-A-CATH, left chest;  Surgeon: Stark Klein, MD;  Location: [place];  Service: General;  Laterality: Left;    SOCIAL HISTORY: Social History   Social History  . Marital Status: Single     Spouse Name: N/A  . Number of Children: 2 children, 41 daughter and 68 yo son    .  Years of Education: N/A   Occupational History  . She is a Barista rep   Social History Main Topics  . Smoking status: Never Smoker   . Smokeless tobacco: Never Used  . Alcohol Use: Yes     Comment: 2-3/wk  . Drug Use: No  . Sexual Activity: Yes    Birth Control/ Protection: Surgical   Other Topics Concern  . Not on file   Social History Narrative    FAMILY HISTORY: Family History  Problem Relation Age of Onset  . Hypertension Mother   . Stroke Mother   . Alzheimer's disease Mother   . Heart disease Father   . Stroke Father   . Heart attack Father   . Breast cancer Sister 50    double mastectomy  . Other Sister     "stomach tumor that wrapped around reproductive organs"; dx. 53s; required partial hysterectomy  . Alzheimer's disease Maternal Grandmother   . Alzheimer's disease Paternal Grandmother   . Other Other     had to have lymph nodes removed and radiation; dx. 61s  . Cancer Maternal Aunt     unspecified type; dx. <50  . Colon cancer Maternal Uncle     dx. 33s  . Breast cancer Cousin     dx. 33s    ALLERGIES:  is allergic to  lisinopril; oxycodone; and percocet [oxycodone-acetaminophen].  MEDICATIONS:  Current Outpatient Prescriptions  Medication Sig Dispense Refill  . bisoprolol (ZEBETA) 5 MG tablet Take 0.5 tablets (2.5 mg total) by mouth at bedtime. 15 tablet 3  . exemestane (AROMASIN) 25 MG tablet TAKE 1 TABLET BY MOUTH DAILY AFTER BREAKFAST. 30 tablet 0  . fluticasone (FLONASE) 50 MCG/ACT nasal spray Place 1 spray into both nostrils daily as needed for allergies.   0  . ibuprofen (ADVIL,MOTRIN) 800 MG tablet Take 800 mg by mouth daily as needed for headache or moderate pain.    . Iron-Vitamins (GERITOL) LIQD Take 30 mLs by mouth daily. Energy support    . losartan (COZAAR) 25 MG tablet Take 0.5 tablets (12.5 mg total) by mouth daily. 45 tablet 3  . traZODone (DESYREL) 50 MG tablet Take 1 tablet (50 mg total) by mouth at bedtime. May increase to 12m QHS if needed 60 tablet 0  . zolpidem (AMBIEN CR) 12.5 MG CR tablet Take 1 tablet (12.5 mg total) by mouth at bedtime as needed for sleep. 30 tablet 2  . Neratinib Maleate (NERLYNX) 40 MG tablet Take 6 tablets (240 mg total) by mouth daily. Take with food. 180 tablet 2   No current facility-administered medications for this visit.    Facility-Administered Medications Ordered in Other Visits  Medication Dose Route Frequency Provider Last Rate Last Dose  . heparin lock flush 100 unit/mL  500 Units Intracatheter Once PRN YTruitt Merle MD      . sodium chloride flush (NS) 0.9 % injection 10 mL  10 mL Intracatheter PRN YTruitt Merle MD        REVIEW OF SYSTEMS:   Constitutional: Denies fevers, chills or abnormal night sweats (+) fatigue Eyes: Denies blurriness of vision, double vision or watery eyes Ears, nose, mouth, throat, and face: Denies mucositis or sore throat Respiratory: Denies cough, dyspnea or wheezes Cardiovascular: Denies palpitation, chest discomfort or lower extremity swelling Gastrointestinal:  Denies nausea, heartburn or change in bowel habits Skin:  Denies abnormal skin rashes Lymphatics: Denies new lymphadenopathy or easy bruising Neurological:Denies numbness, tingling or new weaknesses Behavioral/Psych: Mood is stable, no new changes  All other systems were reviewed with the patient and are negative.  PHYSICAL EXAMINATION:  ECOG PERFORMANCE STATUS: 1 BP 108/68 (BP Location: Left Arm, Patient Position: Sitting)   Pulse 82   Temp 98.9 F (37.2 C) (Oral)   Resp 18   Ht _0  (1.676 m)   Wt 139 lb 12.8 oz (63.4 kg)   SpO2 100%   BMI 22.56 kg/m   GENERAL:alert, no distress and comfortable SKIN: skin color, texture, turgor are normal, no rashes or significant lesions EYES: normal, conjunctiva are pink and non-injected, sclera clear, eye lids appears normal OROPHARYNX:no exudate, no erythema and lips, buccal mucosa, and tongue normal  NECK: supple, thyroid normal size, non-tender, without nodularity LYMPH:  no palpable lymphadenopathy in the cervical, axillary or inguinal LUNGS: clear to auscultation and percussion with normal breathing effort HEART: regular rate & rhythm and no murmurs and no lower extremity edema ABDOMEN:abdomen soft, non-tender and normal bowel sounds Musculoskeletal:no cyanosis of digits and no clubbing  PSYCH: alert & oriented x 3 with fluent speech NEURO: no focal motor/sensory deficits Breasts: s/p bilateral mastectomy and tissue spender placement. Surgical incision sites are clean and well healed.     LABORATORY DATA:  I have reviewed the data as listed CBC Latest Ref Rng & Units 03/08/2017 02/14/2017 01/03/2017  WBC 3.9 - 10.3 10e3/uL 5.0 5.2 4.6  Hemoglobin 11.6 - 15.9 g/dL 12.1 11.8 11.7  Hematocrit 34.8 - 46.6 % 35.9 34.8 34.3(L)  Platelets 145 - 400 10e3/uL 245 273 304    CMP Latest Ref Rng & Units 03/08/2017 02/14/2017 01/03/2017  Glucose 70 - 140 mg/dl 110 121 92  BUN 7.0 - 26.0 mg/dL 10.0 9.3 12.0  Creatinine 0.6 - 1.1 mg/dL 0.7 0.8 0.7  Sodium 136 - 145 mEq/L 139 141 140  Potassium 3.5 - 5.1  mEq/L 3.5 3.5 3.4(L)  Chloride 101 - 111 mmol/L - - -  CO2 22 - 29 mEq/L _1 Calcium 8.4 - 10.4 mg/dL 9.7 9.5 9.5  Total Protein 6.4 - 8.3 g/dL 7.5 7.3 7.4  Total Bilirubin 0.20 - 1.20 mg/dL 0.80 0.72 0.98  Alkaline Phos 40 - 150 U/L 78 78 79  AST 5 - 34 U/L _2 ALT 0 - 55 U/L _3 Pathology report Diagnosis 11/25/2015 1. Breast, simple mastectomy, right - INVASIVE DUCTAL CARCINOMA, GRADE 2/3, SPANNING 2.0 CM. - DUCTAL CARCINOMA IN SITU, INTERMEDIATE GRADE. - LYMPHOVASCULAR INVASION IS IDENTIFIED. - LOBULAR NEOPLASIA (ATYPICAL LOBULAR HYPERPLASIA). - THE SURGICAL RESECTION MARGINS ARE NEGATIVE FOR CARCINOMA. - SEE ONCOLOGY TABLE BELOW. 2. Lymph node, sentinel, biopsy, right axillary #1 - METASTATIC CARCINOMA IN 1 OF 1 LYMPH NODE (1/1). 3. Lymph node, sentinel, biopsy, right axillary #2 - THERE IS NO EVIDENCE OF CARCINOMA IN 1 OF 1 LYMPH NODE (0/1). 4. Lymph node, sentinel, biopsy, right axillary #3 - THERE IS NO EVIDENCE OF CARCINOMA IN 1 OF 1 LYMPH NODE (0/1). 5. Breast, simple mastectomy, left - BENIGN BREAST PARENCHYMA WITH DENSE STROMAL FIBROSIS. - FIBROADENOMA. - THERE IS NO EVIDENCE OF MALIGNANCY. 6. Breast, excision, left, additional lateral margin - BENIGN FIBROADIPOSE TISSUE. - THERE IS NO EVIDENCE OF MALIGNANCY. - SEE COMMENT. Microscopic Comment 1. BREAST, INVASIVE TUMOR, WITH LYMPH NODES PRESENT Specimen, including laterality and lymph node sampling (sentinel, non-sentinel): Right breast and right axillary lymph nodes Procedure: Bilateral mastectomy and multiple right axillary lymph node resections Histologic type: Ductal Grade: 2 Tubule formation: 2 1 of 4 FINAL for  Corning, Reyana ([id number]) Microscopic Comment(continued) Nuclear pleomorphism: 2 Mitotic: 2 Tumor size (gross measurement): 2.0 cm Margins: Negative for carcinoma Invasive, distance to closest margin: 1.8 cm to the posterior margin In-situ, distance to closest margin:  1.8 cm to the posterior margin Lymphovascular invasion: Present Ductal carcinoma in situ: Present Grade: Intermediate grade Extensive intraductal component: Not identified Lobular neoplasia: Present, atypical lobular hyperplasia Tumor focality: Unifocal Treatment effect: Not identified Extent of tumor: Confined to breast parenchyma Lymph nodes: Examined: 3 Sentinel 0 Non-sentinel 3 Total Lymph nodes with metastasis: 1 (macrometastasis) - no extracapsular extension is identified. Breast prognostic profile: [phone] Estrogen receptor: 100%, strong staining intensity Progesterone receptor: 100%, strong staining intensity Her 2 neu: Amplification was detected. The ratio was 2.58 Ki-67: 60% TNM: pT1c, pN1a Non-neoplastic breast: No significant findings. 6. The surgical resection margin(s) of the specimen were inked and microscopically evaluated. Enid Cutter MD Pathologist, Electronic Signature (Case signed 11/29/2015)   RADIOGRAPHIC STUDIES: I have personally reviewed the radiological images as listed and agreed with the findings in the report.  Echo 01/02/2017 Impressions: - Compared to a prior study in 07/2016, the LVEF is stable however,   LV strain is worse at -15% with inferolateral strain abnormality.  Bone scan 11/13/2016 IMPRESSION: IMPRESSION: 1. No evidence for metastatic disease. 2. Lumbar spine degenerative disc disease.   CT abdomen and pelvis w contrast 11/13/2016 IMPRESSION: 1.8 cm hypervascular lesion in segment 6, unchanged. Given stability, a benign etiology such as FNH or flash filling hemangioma is favored. Metastasis is difficult to exclude but is considered less likely.  1.9 cm lesion in segment 3, compatible with a benign hemangioma.  No findings specific for metastatic disease in the abdomen. Please note that the pelvis was not imaged.   CT head w wo contrast 11/13/2016 IMPRESSION: Negative CT head with contrast.   ECHO  08/02/2016 Impressions: - Normal LV size with EF 55%. Strain as noted above. Normal RV size   and systolic function.  ASSESSMENT & PLAN:  51 y.o. African-American female, surgical postmenopausal, history of stage I left breast cancer, with newly diagnosed right breast cancer.  1. Right breast invasive ductal carcinoma, pT1cN1aM0, stage IIB, grade 2, ER100%+/PR100%+/HER2+ -I previously reviewed her surgical pathology results with patient in great details. She has 1 out of 3 sentinel lymph nodes positive, her pathological stage is more advanced than her initial clinical stage.  -We reviewed the natural history of triple positive breast cancer. HER-2 positive tumors are more progressive, with higher risk of recurrence -I reviewed her repeated CT and bone scan images from 11/13/2016 with pt in detail, which are negative for metastatic disease. She has 2 small liver lesions, likely benign, unchanged. Unfortunately she is not able to do liver MRI, due to her breast implants.  -She has completed adjuvant chemotherapy TCHP, Herceptin and pejeta were held for the last 2 cycle due to her decreased EF. -Her repeated echo has showed normal EF, she will continue Herceptin maintenance therapy. -She  has completed adjuvant breast and axilla radiation and tolerated well. -She previously could not tolerate letrozole and it was switched to exemestane, she tolerates it well lately, will continue for 5-7 years.  -She still had moderate hot flash, tolerable. She tried Effexor, did not help. -She is finishing one year of Herceptin treatment today -I recommended neratinib, a recently FDA approved oral her2 antibody, after she competes herceptin. The potential benefit and side effects were discussed with her, especially diarrhea and the management strategy, she voiced good understanding and  agrees to proceed. I'll send a prescription to our pharmacy today, plan to start her in 2-3 weeks. -She will follow-up with her  cardiologist, possible have another repeat echo in a few months. -We reviewed to breast cancer surveillance. She is status post bilateral mastectomy, no need to routine mammogram. I encouraged her to do self exam. She'll follow-up with [place] every 3-6 months for labs and exam.  2. Genetics -Due to her young age and recurrent breast cancer, she was referred to see a genetic counselor in our cancer center. -Her genetic test was negative  3. DM -she will continue follow-up with her primary care physician -She is on metformin  4. Bone health -She is postmenopausal by surgery - her bone density scan from December 2017 was normal, -  I reviewed the CT scan and bone density scan with her. I have reccommened her to see a  Orthopedic surgeon.  - I encouraged her to take vitamin D and calcium, along with drinking more water  6. Insomnia and low appetite  - improved some, continue mirtazapine -previously has not been able to wean off Lorrin Mais, will continue for now   7. Headaches - probably secondary to her cervical spine degenerative arthritis -Improved after steroids injection -CT head was previously negative   8. Hot flush -Secondary to menopause and exemestane, improved, manageable- -she previously tried Effexor, didn't feel helpful  9. Mood  - I have advised the patient that her exemestane could be causing her anxious and agitated moods. Patient says she can handle it for now. If it gets out of hand, she will call me. -She previously tried Effexor, did not tolerate well.  - I have advised the patient to exercise, she plan to do yoga, which may help   Plan for today  -last dose Herceptin today -I will send prescription of neratinib to our oral pharmacy, plan to start in 2-3 weeks  -f/u in 6 weeks - refill ambien prescription   All questions were answered. The patient knows to call the clinic with any problems, questions or concerns.  I spent 25 minutes counseling the patient face to  face. The total time spent in the appointment was 30 minutes and more than 50% was on counseling.  This document serves as a record of services personally performed by Truitt Merle, MD. It was created on her behalf by Brandt Loosen, a trained medical scribe. The creation of this record is based on the scribe's personal observations and the provider's statements to them. This document has been checked and approved by the attending provider.     Truitt Merle, MD 03/08/2017

## 2017-03-08 ENCOUNTER — Ambulatory Visit: Payer: BLUE CROSS/BLUE SHIELD

## 2017-03-08 ENCOUNTER — Other Ambulatory Visit (HOSPITAL_BASED_OUTPATIENT_CLINIC_OR_DEPARTMENT_OTHER): Payer: BLUE CROSS/BLUE SHIELD

## 2017-03-08 ENCOUNTER — Other Ambulatory Visit: Payer: BLUE CROSS/BLUE SHIELD

## 2017-03-08 ENCOUNTER — Ambulatory Visit (HOSPITAL_BASED_OUTPATIENT_CLINIC_OR_DEPARTMENT_OTHER): Payer: BLUE CROSS/BLUE SHIELD

## 2017-03-08 ENCOUNTER — Encounter: Payer: Self-pay | Admitting: Hematology

## 2017-03-08 ENCOUNTER — Encounter: Payer: Self-pay | Admitting: *Deleted

## 2017-03-08 ENCOUNTER — Ambulatory Visit (HOSPITAL_BASED_OUTPATIENT_CLINIC_OR_DEPARTMENT_OTHER): Payer: BLUE CROSS/BLUE SHIELD | Admitting: Hematology

## 2017-03-08 VITALS — BP 108/68 | HR 82 | Temp 98.9°F | Resp 18 | Ht 66.0 in | Wt 139.8 lb

## 2017-03-08 DIAGNOSIS — E46 Unspecified protein-calorie malnutrition: Secondary | ICD-10-CM

## 2017-03-08 DIAGNOSIS — C50411 Malignant neoplasm of upper-outer quadrant of right female breast: Secondary | ICD-10-CM

## 2017-03-08 DIAGNOSIS — G4700 Insomnia, unspecified: Secondary | ICD-10-CM

## 2017-03-08 DIAGNOSIS — E119 Type 2 diabetes mellitus without complications: Secondary | ICD-10-CM

## 2017-03-08 DIAGNOSIS — N951 Menopausal and female climacteric states: Secondary | ICD-10-CM | POA: Diagnosis not present

## 2017-03-08 DIAGNOSIS — F39 Unspecified mood [affective] disorder: Secondary | ICD-10-CM | POA: Diagnosis not present

## 2017-03-08 DIAGNOSIS — Z5112 Encounter for antineoplastic immunotherapy: Secondary | ICD-10-CM

## 2017-03-08 DIAGNOSIS — R51 Headache: Secondary | ICD-10-CM | POA: Diagnosis not present

## 2017-03-08 DIAGNOSIS — Z170 Estrogen receptor positive status [ER+]: Principal | ICD-10-CM

## 2017-03-08 LAB — CBC WITH DIFFERENTIAL/PLATELET
BASO%: 0.6 % (ref 0.0–2.0)
Basophils Absolute: 0 10*3/uL (ref 0.0–0.1)
EOS%: 1.4 % (ref 0.0–7.0)
Eosinophils Absolute: 0.1 10*3/uL (ref 0.0–0.5)
HEMATOCRIT: 35.9 % (ref 34.8–46.6)
HGB: 12.1 g/dL (ref 11.6–15.9)
LYMPH%: 23.9 % (ref 14.0–49.7)
MCH: 30.6 pg (ref 25.1–34.0)
MCHC: 33.7 g/dL (ref 31.5–36.0)
MCV: 90.7 fL (ref 79.5–101.0)
MONO#: 0.3 10*3/uL (ref 0.1–0.9)
MONO%: 5.4 % (ref 0.0–14.0)
NEUT#: 3.4 10*3/uL (ref 1.5–6.5)
NEUT%: 68.7 % (ref 38.4–76.8)
PLATELETS: 245 10*3/uL (ref 145–400)
RBC: 3.96 10*6/uL (ref 3.70–5.45)
RDW: 13.3 % (ref 11.2–14.5)
WBC: 5 10*3/uL (ref 3.9–10.3)
lymph#: 1.2 10*3/uL (ref 0.9–3.3)

## 2017-03-08 LAB — COMPREHENSIVE METABOLIC PANEL
ALBUMIN: 4.1 g/dL (ref 3.5–5.0)
ALT: 12 U/L (ref 0–55)
ANION GAP: 9 meq/L (ref 3–11)
AST: 15 U/L (ref 5–34)
Alkaline Phosphatase: 78 U/L (ref 40–150)
BILIRUBIN TOTAL: 0.8 mg/dL (ref 0.20–1.20)
BUN: 10 mg/dL (ref 7.0–26.0)
CALCIUM: 9.7 mg/dL (ref 8.4–10.4)
CO2: 25 mEq/L (ref 22–29)
Chloride: 105 mEq/L (ref 98–109)
Creatinine: 0.7 mg/dL (ref 0.6–1.1)
GLUCOSE: 110 mg/dL (ref 70–140)
POTASSIUM: 3.5 meq/L (ref 3.5–5.1)
Sodium: 139 mEq/L (ref 136–145)
TOTAL PROTEIN: 7.5 g/dL (ref 6.4–8.3)

## 2017-03-08 MED ORDER — NERATINIB MALEATE 40 MG PO TABS: 240.0000 mg | ORAL_TABLET | Freq: Every day | ORAL | 2 refills | Status: DC

## 2017-03-08 MED ORDER — SODIUM CHLORIDE 0.9% FLUSH
10.0000 mL | INTRAVENOUS | Status: DC | PRN
  Administered 2017-03-08: 10 mL
  Filled 2017-03-08: qty 10

## 2017-03-08 MED ORDER — ACETAMINOPHEN 325 MG PO TABS
ORAL_TABLET | ORAL | Status: AC
  Filled 2017-03-08: qty 2

## 2017-03-08 MED ORDER — SODIUM CHLORIDE 0.9 % IV SOLN
Freq: Once | INTRAVENOUS | Status: AC
  Administered 2017-03-08: 11:00:00 via INTRAVENOUS

## 2017-03-08 MED ORDER — HEPARIN SOD (PORK) LOCK FLUSH 100 UNIT/ML IV SOLN
500.0000 [IU] | Freq: Once | INTRAVENOUS | Status: AC | PRN
  Administered 2017-03-08: 500 [IU]
  Filled 2017-03-08: qty 5

## 2017-03-08 MED ORDER — DIPHENHYDRAMINE HCL 25 MG PO CAPS
50.0000 mg | ORAL_CAPSULE | Freq: Once | ORAL | Status: AC
  Administered 2017-03-08: 50 mg via ORAL

## 2017-03-08 MED ORDER — TRASTUZUMAB CHEMO 150 MG IV SOLR
6.0000 mg/kg | Freq: Once | INTRAVENOUS | Status: AC
  Administered 2017-03-08: 378 mg via INTRAVENOUS
  Filled 2017-03-08: qty 18

## 2017-03-08 MED ORDER — DIPHENHYDRAMINE HCL 25 MG PO CAPS
ORAL_CAPSULE | ORAL | Status: AC
  Filled 2017-03-08: qty 2

## 2017-03-08 MED ORDER — SODIUM CHLORIDE 0.9 % IJ SOLN
10.0000 mL | INTRAMUSCULAR | Status: DC | PRN
  Administered 2017-03-08: 10 mL
  Filled 2017-03-08: qty 10

## 2017-03-08 MED ORDER — ZOLPIDEM TARTRATE ER 12.5 MG PO TBCR: 12.5000 mg | EXTENDED_RELEASE_TABLET | Freq: Every evening | ORAL | 2 refills | Status: DC | PRN

## 2017-03-08 MED ORDER — ACETAMINOPHEN 325 MG PO TABS
650.0000 mg | ORAL_TABLET | Freq: Once | ORAL | Status: AC
  Administered 2017-03-08: 650 mg via ORAL

## 2017-03-08 NOTE — Patient Instructions (Signed)
[facility] Discharge Instructions for Patients Receiving Chemotherapy  Today you received the following chemotherapy agents:  Herceptin  To help prevent nausea and vomiting after your treatment, we encourage you to take your nausea medication as prescribed.   If you develop nausea and vomiting that is not controlled by your nausea medication, call the clinic.   BELOW ARE SYMPTOMS THAT SHOULD BE REPORTED IMMEDIATELY:  *FEVER GREATER THAN 100.5 F  *CHILLS WITH OR WITHOUT FEVER  NAUSEA AND VOMITING THAT IS NOT CONTROLLED WITH YOUR NAUSEA MEDICATION  *UNUSUAL SHORTNESS OF BREATH  *UNUSUAL BRUISING OR BLEEDING  TENDERNESS IN MOUTH AND THROAT WITH OR WITHOUT PRESENCE OF ULCERS  *URINARY PROBLEMS  *BOWEL PROBLEMS  UNUSUAL RASH Items with * indicate a potential emergency and should be followed up as soon as possible.  Feel free to call the clinic you have any questions or concerns. The clinic phone number is [phone].  Please show the CHEMO ALERT CARD at check-in to the Emergency Department and triage nurse.   

## 2017-03-12 ENCOUNTER — Telehealth: Payer: Self-pay | Admitting: Pharmacist

## 2017-03-12 NOTE — Telephone Encounter (Signed)
Oral Chemotherapy Pharmacist Encounter  Received new prescription for Nerlynx for patient with breast cancer Labs from 03/08/17 reviewed, OK for treatment Current medication list in Epic assessed, no DDIs with Nerlynx identified.  Prescription will be faxed to [facility] (pf: [phone], f: [phone]) as this is a limited distribution medication.  Oral Oncology Clinic will continue to follow.  Johny Drilling, PharmD, BCPS, BCOP 03/12/2017  4:58 PM Oral Oncology Clinic [phone]

## 2017-03-14 MED FILL — ZOLPIDEM TART ER 12.5 MG TA: 12.5 | 30 days supply | Qty: 30 | Fill #0

## 2017-03-16 ENCOUNTER — Telehealth: Payer: Self-pay | Admitting: *Deleted

## 2017-03-16 NOTE — Telephone Encounter (Signed)
Received call from [name] @ [facility] re:  Nerlynx copay is  $ 10.00 with copay card.  [facility] will contact pt to set up delivery of medications.   Call transferred to oral chemo pharmacist voice mail as well. Sandra James's   Phone    [phone]  Ext  [phone].

## 2017-03-20 ENCOUNTER — Telehealth: Payer: Self-pay | Admitting: *Deleted

## 2017-03-20 NOTE — Telephone Encounter (Signed)
Received confirmation of Nerlynx prescription shipment for 03/19/17 from [facility].

## 2017-03-28 ENCOUNTER — Telehealth: Payer: Self-pay | Admitting: *Deleted

## 2017-03-28 NOTE — Telephone Encounter (Signed)
Pt called reporting that she has had 6 -7 episodes of diarrhea today from Nerlynx.  Stated she has been taking Nerlynx for about a week now, fine until today has diarrhea.  Stated she took 1 tab Imodium.  Instructed pt to take another Imodium now, increased po fluid intake as tolerated. Dr. Burr Medico notified.  Spoke with pt again, and informed her re: per Dr. Burr Medico, diarrhea symptom will get better as pt tolerates Nerlynx after the first month.  Instructed pt to continue to take Imodium as directed - prophylactic - up to 6 tabs daily, or take 2 tabs with first diarrhea episode, and 1 tab thereafter after each loose stools up to 8 tabs daily. Pt voiced understanding.

## 2017-04-03 MED FILL — EXEMESTANE 25 MG TABLET: 25 | 30 days supply | Qty: 30 | Fill #0

## 2017-04-04 ENCOUNTER — Telehealth: Payer: Self-pay | Admitting: *Deleted

## 2017-04-04 NOTE — Telephone Encounter (Signed)
Patient called stating that she thinks she is having a medication reaction to her Nerlynx. Her Hay fever has been getting more intense since she has been on this medication. Roof of mouth is constantly itching and she has noticed sores on her tongue followed by tingling of her lips/mouth. These symptoms she has noticed a day or two after she started taking Nerlynx.

## 2017-04-04 NOTE — Telephone Encounter (Signed)
Called pt & informed per Dr Burr Medico to stop Nerlynx for 5-7 days & take benadryl or claritin & if symptoms clear restart Nerlynx if she is willing to try again.  Pt states she will call us to give [place] an update before restarting. She states she wants to make sure that this drug is helping her if she is going to go through all of this.  She reports no BM today but had a couple yest.  She is being more careful about what she eats.

## 2017-04-13 ENCOUNTER — Other Ambulatory Visit (HOSPITAL_BASED_OUTPATIENT_CLINIC_OR_DEPARTMENT_OTHER): Payer: BLUE CROSS/BLUE SHIELD

## 2017-04-13 ENCOUNTER — Telehealth: Payer: Self-pay | Admitting: *Deleted

## 2017-04-13 ENCOUNTER — Ambulatory Visit (HOSPITAL_BASED_OUTPATIENT_CLINIC_OR_DEPARTMENT_OTHER): Payer: BLUE CROSS/BLUE SHIELD

## 2017-04-13 DIAGNOSIS — Z170 Estrogen receptor positive status [ER+]: Principal | ICD-10-CM

## 2017-04-13 DIAGNOSIS — C50411 Malignant neoplasm of upper-outer quadrant of right female breast: Secondary | ICD-10-CM

## 2017-04-13 LAB — CBC WITH DIFFERENTIAL/PLATELET
BASO%: 0.8 % (ref 0.0–2.0)
BASOS ABS: 0 10*3/uL (ref 0.0–0.1)
EOS ABS: 0.1 10*3/uL (ref 0.0–0.5)
EOS%: 1.9 % (ref 0.0–7.0)
HEMATOCRIT: 37.2 % (ref 34.8–46.6)
HEMOGLOBIN: 12.6 g/dL (ref 11.6–15.9)
LYMPH#: 1.2 10*3/uL (ref 0.9–3.3)
LYMPH%: 20.4 % (ref 14.0–49.7)
MCH: 31.1 pg (ref 25.1–34.0)
MCHC: 34 g/dL (ref 31.5–36.0)
MCV: 91.6 fL (ref 79.5–101.0)
MONO#: 0.5 10*3/uL (ref 0.1–0.9)
MONO%: 7.5 % (ref 0.0–14.0)
NEUT%: 69.4 % (ref 38.4–76.8)
NEUTROS ABS: 4.2 10*3/uL (ref 1.5–6.5)
Platelets: 288 10*3/uL (ref 145–400)
RBC: 4.06 10*6/uL (ref 3.70–5.45)
RDW: 12.9 % (ref 11.2–14.5)
WBC: 6 10*3/uL (ref 3.9–10.3)

## 2017-04-13 LAB — COMPREHENSIVE METABOLIC PANEL
ALBUMIN: 4.3 g/dL (ref 3.5–5.0)
ALK PHOS: 74 U/L (ref 40–150)
ALT: 14 U/L (ref 0–55)
AST: 16 U/L (ref 5–34)
Anion Gap: 9 mEq/L (ref 3–11)
BILIRUBIN TOTAL: 0.72 mg/dL (ref 0.20–1.20)
BUN: 11.1 mg/dL (ref 7.0–26.0)
CALCIUM: 10.1 mg/dL (ref 8.4–10.4)
CO2: 28 mEq/L (ref 22–29)
CREATININE: 0.8 mg/dL (ref 0.6–1.1)
Chloride: 104 mEq/L (ref 98–109)
EGFR: >90 mL/min/{1.73_m2} (ref >90)
Glucose: 87 mg/dl (ref 70–140)
POTASSIUM: 3.8 meq/L (ref 3.5–5.1)
Sodium: 142 mEq/L (ref 136–145)
TOTAL PROTEIN: 7.9 g/dL (ref 6.4–8.3)

## 2017-04-13 MED ORDER — SODIUM CHLORIDE 0.9 % IJ SOLN
10.0000 mL | INTRAMUSCULAR | Status: DC | PRN
  Filled 2017-04-13: qty 10

## 2017-04-13 MED ORDER — HEPARIN SOD (PORK) LOCK FLUSH 100 UNIT/ML IV SOLN
500.0000 [IU] | Freq: Once | INTRAVENOUS | Status: DC | PRN
  Filled 2017-04-13: qty 5

## 2017-04-13 MED ORDER — HEPARIN SOD (PORK) LOCK FLUSH 100 UNIT/ML IV SOLN
500.0000 [IU] | Freq: Once | INTRAVENOUS | Status: AC | PRN
  Administered 2017-04-13: 500 [IU]
  Filled 2017-04-13: qty 5

## 2017-04-13 MED ORDER — SODIUM CHLORIDE 0.9 % IJ SOLN
10.0000 mL | INTRAMUSCULAR | Status: DC | PRN
  Administered 2017-04-13: 10 mL
  Filled 2017-04-13: qty 10

## 2017-04-13 MED FILL — ZOLPIDEM TART ER 12.5 MG TA: 12.5 | 30 days supply | Qty: 30 | Fill #1

## 2017-04-13 NOTE — Telephone Encounter (Signed)
Received call from pt stating that she is extremely tired.  She had a hard time getting up & ready for work today.  She has been off the Nerlynx @ 1 week per Dr Ernestina Penna orders & she was only on it for 1-11/2 weeks & had a reaction.  Instructed pt to come for labs this am.  Scheduling message sent.

## 2017-04-17 NOTE — Progress Notes (Signed)
[place]  [phone] [phone]  Clinic follow Up Note   Patient Care Team: Kristie Cowman, MD as PCP - General (Family Medicine) Stark Klein, MD as Consulting Physician (General Surgery) Truitt Merle, MD as Consulting Physician (Hematology) Arloa Koh, MD as Consulting Physician (Radiation Oncology) Mauro Kaufmann, RN as Registered Nurse Rockwell Germany, RN as Registered Nurse Sylvan Cheese, NP as Nurse Practitioner (Nurse Practitioner) 04/19/2017  CHIEF COMPLAINTS:  Follow up right breast cancer  Oncology History   Cancer Staging Breast cancer of upper-outer quadrant of right female breast [facility]) Staging form: Breast, AJCC 7th Edition - Clinical stage from 09/15/2015: Stage IA (T1b, N0, M0) - Signed by Truitt Merle, MD on 12/16/2015 - Pathologic stage from 11/25/2015: Stage IIA (T1c, N1a, cM0) - Signed by Truitt Merle, MD on 12/16/2015       Breast cancer of upper-outer quadrant of right female breast ([place])   09/07/2015 Mammogram    Diagnostic mammogram and the ultrasound of the right breast showed a 0.8 cm lobulated mass in the right breast 11 to 12:00 position 9 cm from the nipple. No other lesions or adenopathy.      09/08/2015 Initial Diagnosis    Breast cancer of upper-outer quadrant of right female breast ([place])      09/08/2015 Initial Biopsy    Right breast mass at the upper outer quadrant core needle biopsy showed invasive ductal carcinoma, grade 2-3, ductal carcinoma in situ.      09/08/2015 Receptors her2    ER 100% positive, PR 100% positive, Ki-67 60%, HER-2 positive with copy # 4.25 and ratio 2.58      11/25/2015 Surgery    Right breast mastectomy and sentinel lymph node biopsy      11/25/2015 Pathology Results    Right breast invasive ductal carcinoma, grade 2, 2.0 cm, DCIS, intermediate grade, (+) LVI, margins were negative, 1 out of 3 lymph nodes were negative. Left breast ostectomy was negative for malignancy.      01/06/2016 - 04/20/2016 Chemotherapy    adjuvant chemotherapy TCH P, every 3 weeks, for a total of 6 cycles. Herceptin and Perjeta were held for last two cycles due to drop of her EF on echo       05/25/2016 - 07/11/2016 Radiation Therapy    Adjuvant breast radiation      06/08/2016 - 03/08/2017 Chemotherapy    She started maintenance Herceptin every 3 weeks, after clearance from cardiology.      08/11/2016 -  Anti-estrogen oral therapy    Exemestane 25 mg daily      11/13/2016 Imaging    Bone Scan IMPRESSION: 1. No evidence for metastatic disease. 2. Lumbar spine degenerative disc disease.      11/13/2016 Imaging    CT Abdomen W Contrast IMPRESSION: 1.8 cm hypervascular lesion in segment 6, unchanged. Given stability, a benign etiology such as FNH or flash filling hemangioma is favored. Metastasis is difficult to exclude but is considered less likely. 1.9 cm lesion in segment 3, compatible with a benign hemangioma. No findings specific for metastatic disease in the abdomen. Please note that the pelvis was not imaged.      11/13/2016 Imaging    CT Head W Contrast IMPRESSION: Negative CT head with contrast.      01/02/2017 Imaging    Echo 01/02/2017 Impressions: - Compared to a prior study in 07/2016, the LVEF is stable however,   LV strain is worse at -15% with inferolateral strain abnormality.  HISTORY OF PRESENTING ILLNESS:  Sandra James 51 y.o. female with past medical history of stage I left breast cancer, is here because of newly diagnosed right breast cancer. She presents to our multidisciplinary breast clinic by herself.  The right breast cancer was discovered by screening mammogram. She did not have any palpable mass, or any constitutional symptoms. She was diagnosed with stage I left breast cancer at age of 67, she had lumpectomy, radiation, and adjuvant chemotherapy and 5 years of tamoxifen. She was treated by Dr. Louann Sjogren in [place]. She moved to  [facility] about year ago due to job change. She has been very compliant with annual screening mammogram.  She had hysterectomy and bilateral oophorectomy and sphincterectomy 5 years ago for heavy bleeding.. She has been having hot flashes since then, moderate, but manageable. She is single, lives alone, works for 2 to [employer] has to go up children who live in [place].  CURRENT THERAPY: Exemestane 25 mg daily, started on 08/11/2016  [name] returns for follow-up. She presents to the clinic today. She reports she does not like Nerlynx. Her nails have reduced and she reports diarrhea. She used the imodium but that caused constipation. So she goes back and forth with diarrhea and constipation. She was on the medication for 1-2 weeks. Her allergies had worsened as well on the medication. So she is glad she stopped her Nerlynx. She had really bad fatigue last week. She did not get her iron checked today. Her feet feel stiff and some pain. As well as muscle spasm on the right side of her body. She has no longer had. She is eating better with greens as well as ginger and tumeric tea   MED ICAL HISTORY:  Past Medical History:  Diagnosis Date  . Allergy   . Anxiety   . Breast cancer ([place]) 09/07/15   right breast  . Breast cancer of upper-outer quadrant of right female breast ([place]) 09/09/2015  . Cancer [facility])    left brast lumpectomy   . Complication of anesthesia    slow to wake up- BP was low, fainted next day-  . Depression   . Diabetes mellitus without complication ([place])   . Fibroid   . GERD (gastroesophageal reflux disease)   . Infection    UTI  . MVP (mitral valve prolapse)   . Neuromuscular disorder ([place])    sciatic nerve paion left side/leg  . UTI (lower urinary tract infection)     SURGICAL HISTORY: Past Surgical History:  Procedure Laterality Date  . ABDOMINAL HYSTERECTOMY    . BREAST RECONSTRUCTION WITH PLACEMENT OF TISSUE EXPANDER AND FLEX HD (ACELLULAR  HYDRATED DERMIS) Bilateral 11/25/2015   Procedure: BILATERAL IMMEDIATE BREAST RECONSTRUCTION WITH PLACEMENT OF TISSUE EXPANDERS AND FLEX HD (ACELLULAR HYDRATED DERMIS);  Surgeon: Wallace Going, DO;  Location: [place];  Service: Plastics;  Laterality: Bilateral;  . BREAST SURGERY  2004   left lumpectomy  . MASTECTOMY W/ SENTINEL NODE BIOPSY Bilateral 11/25/2015   Procedure: BILATERAL SKIN SPARING MASTECTOMIES WITH RIGHT SENTINEL LYMPH NODE BIOPSY;  Surgeon: Stark Klein, MD;  Location: [place];  Service: General;  Laterality: Bilateral;  . OOPHORECTOMY Bilateral   . PORTACATH PLACEMENT Left 12/20/2015   Procedure: INSERTION PORT-A-CATH, left chest;  Surgeon: Stark Klein, MD;  Location: [place];  Service: General;  Laterality: Left;    SOCIAL HISTORY: Social History   Social History  . Marital Status: Single     Spouse  Name: N/A  . Number of Children: 2 children, 58 daughter and 49 yo son    . Years of Education: N/A   Occupational History  . She is a Barista rep   Social History Main Topics  . Smoking status: Never Smoker   . Smokeless tobacco: Never Used  . Alcohol Use: Yes     Comment: 2-3/wk  . Drug Use: No  . Sexual Activity: Yes    Birth Control/ Protection: Surgical   Other Topics Concern  . Not on file   Social History Narrative    FAMILY HISTORY: Family History  Problem Relation Age of Onset  . Hypertension Mother   . Stroke Mother   . Alzheimer's disease Mother   . Heart disease Father   . Stroke Father   . Heart attack Father   . Breast cancer Sister 96       double mastectomy  . Other Sister        "stomach tumor that wrapped around reproductive organs"; dx. 79s; required partial hysterectomy  . Alzheimer's disease Maternal Grandmother   . Alzheimer's disease Paternal Grandmother   . Other Other        had to have lymph nodes removed and radiation; dx. 59s  . Cancer Maternal Aunt        unspecified type; dx. <50   . Colon cancer Maternal Uncle        dx. 1s  . Breast cancer Cousin        dx. 72s    ALLERGIES:  is allergic to lisinopril; oxycodone; and percocet [oxycodone-acetaminophen].  MEDICATIONS:  Current Outpatient Prescriptions  Medication Sig Dispense Refill  . bisoprolol (ZEBETA) 5 MG tablet Take 0.5 tablets (2.5 mg total) by mouth at bedtime. 15 tablet 3  . exemestane (AROMASIN) 25 MG tablet TAKE 1 TABLET BY MOUTH DAILY AFTER BREAKFAST. 30 tablet 0  . fluticasone (FLONASE) 50 MCG/ACT nasal spray Place 1 spray into both nostrils daily as needed for allergies.   0  . ibuprofen (ADVIL,MOTRIN) 800 MG tablet Take 800 mg by mouth daily as needed for headache or moderate pain.    . Iron-Vitamins (GERITOL) LIQD Take 30 mLs by mouth daily. Energy support    . losartan (COZAAR) 25 MG tablet Take 0.5 tablets (12.5 mg total) by mouth daily. 45 tablet 3  . traZODone (DESYREL) 50 MG tablet Take 1 tablet (50 mg total) by mouth at bedtime. May increase to 128m QHS if needed 60 tablet 0  . zolpidem (AMBIEN CR) 12.5 MG CR tablet Take 1 tablet (12.5 mg total) by mouth at bedtime as needed for sleep. 30 tablet 2  . Neratinib Maleate (NERLYNX) 40 MG tablet Take 6 tablets (240 mg total) by mouth daily. Take with food. (Patient not taking: Reported on 04/19/2017) 180 tablet 2   No current facility-administered medications for this visit.     REVIEW OF SYSTEMS:   Constitutional: Denies fevers, chills or abnormal night sweats (+) fatigue Eyes: Denies blurriness of vision, double vision or watery eyes Ears, nose, mouth, throat, and face: Denies mucositis or sore throat Respiratory: Denies cough, dyspnea or wheezes Cardiovascular: Denies palpitation, chest discomfort or lower extremity swelling Gastrointestinal:  Denies nausea, heartburn (+) diarrhea due to Nerlyx, now resolved Skin: Denies abnormal skin rashes Lymphatics: Denies new lymphadenopathy or easy bruising Neurological:Denies numbness, tingling or  new weaknesses MSK: Muscles spasms on her right side of abd and Leg Behavioral/Psych: Mood is stable, no new changes  All other systems were  reviewed with the patient and are negative.  PHYSICAL EXAMINATION:  ECOG PERFORMANCE STATUS: 1 BP 105/72 (BP Location: Left Arm, Patient Position: Sitting)   Pulse 73   Temp 98.7 F (37.1 C) (Oral)   Resp 18   Ht _0  (1.676 m)   Wt 139 lb 8 oz (63.3 kg)   SpO2 100%   BMI 22.52 kg/m    GENERAL:alert, no distress and comfortable SKIN: skin color, texture, turgor are normal, no rashes or significant lesions EYES: normal, conjunctiva are pink and non-injected, sclera clear, eye lids appears normal OROPHARYNX:no exudate, no erythema and lips, buccal mucosa, and tongue normal  NECK: supple, thyroid normal size, non-tender, without nodularity LYMPH:  no palpable lymphadenopathy in the cervical, axillary or inguinal LUNGS: clear to auscultation and percussion with normal breathing effort HEART: regular rate & rhythm and no murmurs and no lower extremity edema ABDOMEN:abdomen soft, non-tender and normal bowel sounds Musculoskeletal:no cyanosis of digits and no clubbing  PSYCH: alert & oriented x 3 with fluent speech NEURO: no focal motor/sensory deficits Breasts: s/p bilateral mastectomy and tissue spender placement. Surgical incision sites are clean and well healed.     LABORATORY DATA:  I have reviewed the data as listed CBC Latest Ref Rng & Units 04/19/2017 04/13/2017 03/08/2017  WBC 3.9 - 10.3 10e3/uL 5.3 6.0 5.0  Hemoglobin 11.6 - 15.9 g/dL 11.8 12.6 12.1  Hematocrit 34.8 - 46.6 % 35.2 37.2 35.9  Platelets 145 - 400 10e3/uL 286 288 245    CMP Latest Ref Rng & Units 04/19/2017 04/13/2017 03/08/2017  Glucose 70 - 140 mg/dl 92 87 110  BUN 7.0 - 26.0 mg/dL 8.2 11.1 10.0  Creatinine 0.6 - 1.1 mg/dL 0.8 0.8 0.7  Sodium 136 - 145 mEq/L 141 142 139  Potassium 3.5 - 5.1 mEq/L 3.7 3.8 3.5  Chloride 101 - 111 mmol/L - - -  CO2 22 - 29 mEq/L _1 Calcium 8.4 - 10.4 mg/dL 10.0 10.1 9.7  Total Protein 6.4 - 8.3 g/dL 7.5 7.9 7.5  Total Bilirubin 0.20 - 1.20 mg/dL 1.01 0.72 0.80  Alkaline Phos 40 - 150 U/L 72 74 78  AST 5 - 34 U/L _2 ALT 0 - 55 U/L _3 Pathology report Diagnosis 11/25/2015 1. Breast, simple mastectomy, right - INVASIVE DUCTAL CARCINOMA, GRADE 2/3, SPANNING 2.0 CM. - DUCTAL CARCINOMA IN SITU, INTERMEDIATE GRADE. - LYMPHOVASCULAR INVASION IS IDENTIFIED. - LOBULAR NEOPLASIA (ATYPICAL LOBULAR HYPERPLASIA). - THE SURGICAL RESECTION MARGINS ARE NEGATIVE FOR CARCINOMA. - SEE ONCOLOGY TABLE BELOW. 2. Lymph node, sentinel, biopsy, right axillary #1 - METASTATIC CARCINOMA IN 1 OF 1 LYMPH NODE (1/1). 3. Lymph node, sentinel, biopsy, right axillary #2 - THERE IS NO EVIDENCE OF CARCINOMA IN 1 OF 1 LYMPH NODE (0/1). 4. Lymph node, sentinel, biopsy, right axillary #3 - THERE IS NO EVIDENCE OF CARCINOMA IN 1 OF 1 LYMPH NODE (0/1). 5. Breast, simple mastectomy, left - BENIGN BREAST PARENCHYMA WITH DENSE STROMAL FIBROSIS. - FIBROADENOMA. - THERE IS NO EVIDENCE OF MALIGNANCY. 6. Breast, excision, left, additional lateral margin - BENIGN FIBROADIPOSE TISSUE. - THERE IS NO EVIDENCE OF MALIGNANCY. - SEE COMMENT. Microscopic Comment 1. BREAST, INVASIVE TUMOR, WITH LYMPH NODES PRESENT Specimen, including laterality and lymph node sampling (sentinel, non-sentinel): Right breast and right axillary lymph nodes Procedure: Bilateral mastectomy and multiple right axillary lymph node resections Histologic type: Ductal Grade: 2 Tubule formation: 2 1 of 4 FINAL for Sandra James, Sandra James (  [id number]) Microscopic Comment(continued) Nuclear pleomorphism: 2 Mitotic: 2 Tumor size (gross measurement): 2.0 cm Margins: Negative for carcinoma Invasive, distance to closest margin: 1.8 cm to the posterior margin In-situ, distance to closest margin: 1.8 cm to the posterior margin Lymphovascular invasion: Present Ductal carcinoma  in situ: Present Grade: Intermediate grade Extensive intraductal component: Not identified Lobular neoplasia: Present, atypical lobular hyperplasia Tumor focality: Unifocal Treatment effect: Not identified Extent of tumor: Confined to breast parenchyma Lymph nodes: Examined: 3 Sentinel 0 Non-sentinel 3 Total Lymph nodes with metastasis: 1 (macrometastasis) - no extracapsular extension is identified. Breast prognostic profile: [phone] Estrogen receptor: 100%, strong staining intensity Progesterone receptor: 100%, strong staining intensity Her 2 neu: Amplification was detected. The ratio was 2.58 Ki-67: 60% TNM: pT1c, pN1a Non-neoplastic breast: No significant findings. 6. The surgical resection margin(s) of the specimen were inked and microscopically evaluated. Enid Cutter MD Pathologist, Electronic Signature (Case signed 11/29/2015)   RADIOGRAPHIC STUDIES: I have personally reviewed the radiological images as listed and agreed with the findings in the report.  Echo 01/02/2017 Impressions: - Compared to a prior study in 07/2016, the LVEF is stable however,   LV strain is worse at -15% with inferolateral strain abnormality.  Bone scan 11/13/2016 IMPRESSION: IMPRESSION: 1. No evidence for metastatic disease. 2. Lumbar spine degenerative disc disease.   CT abdomen and pelvis w contrast 11/13/2016 IMPRESSION: 1.8 cm hypervascular lesion in segment 6, unchanged. Given stability, a benign etiology such as FNH or flash filling hemangioma is favored. Metastasis is difficult to exclude but is considered less likely.  1.9 cm lesion in segment 3, compatible with a benign hemangioma.  No findings specific for metastatic disease in the abdomen. Please note that the pelvis was not imaged.   CT head w wo contrast 11/13/2016 IMPRESSION: Negative CT head with contrast.  ECHO 01/02/17  Impressions: - Compared to a prior study in 07/2016, the LVEF is stable however,   LV  strain is worse at -15% with inferolateral strain abnormality.  ECHO 08/02/2016 Impressions: - Normal LV size with EF 55%. Strain as noted above. Normal RV size   and systolic function.  ASSESSMENT & PLAN:  51 y.o. African-American female, surgical postmenopausal, history of stage I left breast cancer, with newly diagnosed right breast cancer.  1. Right breast invasive ductal carcinoma, pT1cN1aM0, stage IIB, grade 2, ER100%+/PR100%+/HER2+ -I previously reviewed her surgical pathology results with patient in great details. She has 1 out of 3 sentinel lymph nodes positive, her pathological stage is more advanced than her initial clinical stage.  -We reviewed the natural history of triple positive breast cancer. HER-2 positive tumors are more progressive, with higher risk of recurrence -I reviewed her repeated CT and bone scan images from 11/13/2016 with pt in detail, which are negative for metastatic disease. She has 2 small liver lesions, likely benign, unchanged. Unfortunately she is not able to do liver MRI, due to her breast implants.  -She has completed adjuvant chemotherapy TCHP, Herceptin and pejeta were held for the last 2 cycle due to her decreased EF, she has completed maintenance Herceptin and pejeta in March 2018.  -Her repeated echo has showed normal EF, she will continue Herceptin maintenance therapy. -She  has completed adjuvant breast and axilla radiation and tolerated well. -She previously could not tolerate letrozole and it was switched to exemestane, she tolerates it well lately, will continue for 5-7 years.  -She still had moderate hot flash, tolerable. She tried Effexor, did not help. -she tried neratinib, a recently FDA  approved oral her2 antibody, but could not tolerate due to side effects. She has stopped it.  -She will follow-up with her cardiologist, possible have another repeat echo in a few months. -We reviewed to breast cancer surveillance. She is status post bilateral  mastectomy, no need to routine mammogram. I previously encouraged her to do self exam. She'll follow-up with [place] every 3-6 months for labs and exam. -we reviewed her labs and her Hbg is 11.8 but still in normal range. For her iron I suggest she takes a prenatal vitamin to help her levels. Labs adequate to continue exemestane  -repeat Echo in 1-2 months  -She will get port removed   2. Genetics -Due to her young age and recurrent breast cancer, she was referred to see a genetic counselor in our cancer center. -Her genetic test was negative  3. DM -she will continue follow-up with her primary care physician -She is on metformin  4. Bone health -She is postmenopausal by surgery - her bone density scan from December 2017 was normal, -  I previously reviewed the CT scan and bone density scan with her. I have reccommened her to see a  Orthopedic surgeon.  - I previously encouraged her to take vitamin D and calcium, along with drinking more water -She did get Vitamin D and calcium.   6. Insomnia and low appetite  - improved some, continue mirtazapine -previously has not been able to wean off ambien, will continue for now   7. Hot flush -Secondary to menopause and exemestane, improved, manageable- -she previously tried Effexor, didn't feel helpful  8. Mood swing   - I have previously advised the patient that her exemestane could be causing her anxious and agitated moods. Patient says she can handle it for now. If it gets out of hand, she will call me. -She previously tried Effexor, did not tolerate well.  - I have previously advised the patient to exercise, she plan to do yoga, which may help    Plan for today  -f/u in 4 months  -Patient will schedule Port removal with Dr. Barry Dienes, I sent her a message. -Continue exemestane. She has stopped Neratinib due to poor tolerance.   All questions were answered. The patient knows to call the clinic with any problems, questions or concerns.  I  spent 25 minutes counseling the patient face to face. The total time spent in the appointment was 30 minutes and more than 50% was on counseling.  This document serves as a record of services personally performed by Truitt Merle, MD. It was created on her behalf by Joslyn Devon, a trained medical scribe. The creation of this record is based on the scribe's personal observations and the provider's statements to them. This document has been checked and approved by the attending provider.     Truitt Merle, MD 04/19/2017

## 2017-04-19 ENCOUNTER — Telehealth: Payer: Self-pay | Admitting: *Deleted

## 2017-04-19 ENCOUNTER — Ambulatory Visit (HOSPITAL_BASED_OUTPATIENT_CLINIC_OR_DEPARTMENT_OTHER): Payer: BLUE CROSS/BLUE SHIELD | Admitting: Hematology

## 2017-04-19 ENCOUNTER — Other Ambulatory Visit: Payer: Self-pay | Admitting: General Surgery

## 2017-04-19 ENCOUNTER — Encounter: Payer: Self-pay | Admitting: Hematology

## 2017-04-19 ENCOUNTER — Ambulatory Visit (HOSPITAL_BASED_OUTPATIENT_CLINIC_OR_DEPARTMENT_OTHER): Payer: BLUE CROSS/BLUE SHIELD

## 2017-04-19 ENCOUNTER — Other Ambulatory Visit (HOSPITAL_BASED_OUTPATIENT_CLINIC_OR_DEPARTMENT_OTHER): Payer: BLUE CROSS/BLUE SHIELD

## 2017-04-19 VITALS — BP 105/72 | HR 73 | Temp 98.7°F | Resp 18 | Ht 66.0 in | Wt 139.5 lb

## 2017-04-19 DIAGNOSIS — Z170 Estrogen receptor positive status [ER+]: Secondary | ICD-10-CM | POA: Diagnosis not present

## 2017-04-19 DIAGNOSIS — C50411 Malignant neoplasm of upper-outer quadrant of right female breast: Secondary | ICD-10-CM

## 2017-04-19 DIAGNOSIS — E119 Type 2 diabetes mellitus without complications: Secondary | ICD-10-CM | POA: Diagnosis not present

## 2017-04-19 LAB — CBC WITH DIFFERENTIAL/PLATELET
BASO%: 0.6 % (ref 0.0–2.0)
Basophils Absolute: 0 10*3/uL (ref 0.0–0.1)
EOS ABS: 0.1 10*3/uL (ref 0.0–0.5)
EOS%: 2.3 % (ref 0.0–7.0)
HCT: 35.2 % (ref 34.8–46.6)
HEMOGLOBIN: 11.8 g/dL (ref 11.6–15.9)
LYMPH%: 24.2 % (ref 14.0–49.7)
MCH: 30.7 pg (ref 25.1–34.0)
MCHC: 33.5 g/dL (ref 31.5–36.0)
MCV: 91.7 fL (ref 79.5–101.0)
MONO#: 0.4 10*3/uL (ref 0.1–0.9)
MONO%: 6.8 % (ref 0.0–14.0)
NEUT%: 66.1 % (ref 38.4–76.8)
NEUTROS ABS: 3.5 10*3/uL (ref 1.5–6.5)
Platelets: 286 10*3/uL (ref 145–400)
RBC: 3.84 10*6/uL (ref 3.70–5.45)
RDW: 12.9 % (ref 11.2–14.5)
WBC: 5.3 10*3/uL (ref 3.9–10.3)
lymph#: 1.3 10*3/uL (ref 0.9–3.3)

## 2017-04-19 LAB — COMPREHENSIVE METABOLIC PANEL
ALT: 13 U/L (ref 0–55)
AST: 15 U/L (ref 5–34)
Albumin: 4.2 g/dL (ref 3.5–5.0)
Alkaline Phosphatase: 72 U/L (ref 40–150)
Anion Gap: 9 mEq/L (ref 3–11)
BILIRUBIN TOTAL: 1.01 mg/dL (ref 0.20–1.20)
BUN: 8.2 mg/dL (ref 7.0–26.0)
CO2: 28 meq/L (ref 22–29)
Calcium: 10 mg/dL (ref 8.4–10.4)
Chloride: 104 mEq/L (ref 98–109)
Creatinine: 0.8 mg/dL (ref 0.6–1.1)
EGFR: >90 mL/min/{1.73_m2} (ref >90)
GLUCOSE: 92 mg/dL (ref 70–140)
POTASSIUM: 3.7 meq/L (ref 3.5–5.1)
SODIUM: 141 meq/L (ref 136–145)
TOTAL PROTEIN: 7.5 g/dL (ref 6.4–8.3)

## 2017-04-19 MED ORDER — HEPARIN SOD (PORK) LOCK FLUSH 100 UNIT/ML IV SOLN
500.0000 [IU] | Freq: Once | INTRAVENOUS | Status: AC | PRN
  Administered 2017-04-19: 500 [IU]
  Filled 2017-04-19: qty 5

## 2017-04-19 MED ORDER — SODIUM CHLORIDE 0.9 % IJ SOLN
10.0000 mL | INTRAMUSCULAR | Status: DC | PRN
  Administered 2017-04-19: 10 mL
  Filled 2017-04-19: qty 10

## 2017-04-19 NOTE — Patient Instructions (Signed)
Implanted Port Home Guide An implanted port is a type of central line that is placed under the skin. Central lines are used to provide IV access when treatment or nutrition needs to be given through a person's veins. Implanted ports are used for long-term IV access. An implanted port may be placed because:  You need IV medicine that would be irritating to the small veins in your hands or arms.  You need long-term IV medicines, such as antibiotics.  You need IV nutrition for a long period.  You need frequent blood draws for lab tests.  You need dialysis.  Implanted ports are usually placed in the chest area, but they can also be placed in the upper arm, the abdomen, or the leg. An implanted port has two main parts:  Reservoir. The reservoir is round and will appear as a small, raised area under your skin. The reservoir is the part where a needle is inserted to give medicines or draw blood.  Catheter. The catheter is a thin, flexible tube that extends from the reservoir. The catheter is placed into a large vein. Medicine that is inserted into the reservoir goes into the catheter and then into the vein.  How will I care for my incision site? Do not get the incision site wet. Bathe or shower as directed by your health care provider. How is my port accessed? Special steps must be taken to access the port:  Before the port is accessed, a numbing cream can be placed on the skin. This helps numb the skin over the port site.  Your health care provider uses a sterile technique to access the port. ? Your health care provider must put on a mask and sterile gloves. ? The skin over your port is cleaned carefully with an antiseptic and allowed to dry. ? The port is gently pinched between sterile gloves, and a needle is inserted into the port.  Only "non-coring" port needles should be used to access the port. Once the port is accessed, a blood return should be checked. This helps ensure that the port  is in the vein and is not clogged.  If your port needs to remain accessed for a constant infusion, a clear (transparent) bandage will be placed over the needle site. The bandage and needle will need to be changed every week, or as directed by your health care provider.  Keep the bandage covering the needle clean and dry. Do not get it wet. Follow your health care provider's instructions on how to take a shower or bath while the port is accessed.  If your port does not need to stay accessed, no bandage is needed over the port.  What is flushing? Flushing helps keep the port from getting clogged. Follow your health care provider's instructions on how and when to flush the port. Ports are usually flushed with saline solution or a medicine called heparin. The need for flushing will depend on how the port is used.  If the port is used for intermittent medicines or blood draws, the port will need to be flushed: ? After medicines have been given. ? After blood has been drawn. ? As part of routine maintenance.  If a constant infusion is running, the port may not need to be flushed.  How long will my port stay implanted? The port can stay in for as long as your health care provider thinks it is needed. When it is time for the port to come out, surgery will be   done to remove it. The procedure is similar to the one performed when the port was put in. When should I seek immediate medical care? When you have an implanted port, you should seek immediate medical care if:  You notice a bad smell coming from the incision site.  You have swelling, redness, or drainage at the incision site.  You have more swelling or pain at the port site or the surrounding area.  You have a fever that is not controlled with medicine.  This information is not intended to replace advice given to you by your health care provider. Make sure you discuss any questions you have with your health care provider. Document  Released: 11/27/2005 Document Revised: 05/04/2016 Document Reviewed: 08/04/2013 Elsevier Interactive Patient Education  2017 [employer]  

## 2017-04-19 NOTE — Telephone Encounter (Signed)
"  This is Gracie with [employer].  We just spoke with this mutual patient.  She said she has discontinued Nerlynx and we need to verify."  Verbal order received and read back from Dr. Burr Medico that yes patient did not like or tolerated this medicine well and has been discontinued.  Order given to Gracie at this time.

## 2017-04-20 ENCOUNTER — Telehealth: Payer: Self-pay | Admitting: Hematology

## 2017-04-20 NOTE — Telephone Encounter (Signed)
Patient bypassed scheduling on 04/19/17. Appointments scheduled per 04/19/17 los.  Patient was mailed an appointment letter and appointment schedule, per 04/19/17 los.

## 2017-04-23 ENCOUNTER — Telehealth: Payer: Self-pay | Admitting: *Deleted

## 2017-04-23 NOTE — Telephone Encounter (Signed)
Message from pt: "Why am I scheduled to come back so soon? Is something wrong?" Returned call, discussed survivorship appt. Pt voiced appreciation for call. She requests to reschedule to 5/25 or 5/29 or 5/30. Message to schedulers.

## 2017-04-25 ENCOUNTER — Encounter: Payer: BLUE CROSS/BLUE SHIELD | Admitting: Adult Health

## 2017-05-04 MED FILL — EXEMESTANE 25 MG TABLET: 25 | 30 days supply | Qty: 30 | Fill #1

## 2017-05-11 MED FILL — ZOLPIDEM TART ER 12.5 MG TA: 12.5 | 30 days supply | Qty: 30 | Fill #2

## 2017-05-14 ENCOUNTER — Encounter: Payer: Self-pay | Admitting: Adult Health

## 2017-05-14 ENCOUNTER — Ambulatory Visit (HOSPITAL_BASED_OUTPATIENT_CLINIC_OR_DEPARTMENT_OTHER): Payer: BLUE CROSS/BLUE SHIELD | Admitting: Adult Health

## 2017-05-14 VITALS — BP 114/70 | HR 77 | Temp 97.8°F | Resp 18 | Ht 66.0 in | Wt 140.4 lb

## 2017-05-14 DIAGNOSIS — C50411 Malignant neoplasm of upper-outer quadrant of right female breast: Secondary | ICD-10-CM

## 2017-05-14 DIAGNOSIS — T451X5A Adverse effect of antineoplastic and immunosuppressive drugs, initial encounter: Secondary | ICD-10-CM

## 2017-05-14 DIAGNOSIS — Z170 Estrogen receptor positive status [ER+]: Secondary | ICD-10-CM | POA: Diagnosis not present

## 2017-05-14 DIAGNOSIS — G620 Drug-induced polyneuropathy: Secondary | ICD-10-CM | POA: Diagnosis not present

## 2017-05-14 DIAGNOSIS — R232 Flushing: Secondary | ICD-10-CM

## 2017-05-14 MED ORDER — GABAPENTIN 100 MG PO CAPS: 100.0000 mg | ORAL_CAPSULE | Freq: Every day | ORAL | 0 refills | Status: DC

## 2017-05-14 MED FILL — GABAPENTIN 100 MG CAPSULE: 100 | 30 days supply | Qty: 90 | Fill #0

## 2017-05-14 NOTE — Progress Notes (Signed)
CLINIC:  Survivorship   REASON FOR VISIT:  Routine follow-up post-treatment for a recent history of breast cancer.  BRIEF ONCOLOGIC HISTORY:  Oncology History   Cancer Staging Breast cancer of upper-outer quadrant of right female breast [facility]) Staging form: Breast, AJCC 7th Edition - Clinical stage from 09/15/2015: Stage IA (T1b, N0, M0) - Signed by Truitt Merle, MD on 12/16/2015 - Pathologic stage from 11/25/2015: Stage IIA (T1c, N1a, cM0) - Signed by Truitt Merle, MD on 12/16/2015       Breast cancer of upper-outer quadrant of right female breast ([place])   09/07/2015 Mammogram    Diagnostic mammogram and the ultrasound of the right breast showed a 0.8 cm lobulated mass in the right breast 11 to 12:00 position 9 cm from the nipple. No other lesions or adenopathy.      09/08/2015 Initial Diagnosis    Breast cancer of upper-outer quadrant of right female breast ([place])      09/08/2015 Initial Biopsy    Right breast mass at the upper outer quadrant core needle biopsy showed invasive ductal carcinoma, grade 2-3, ductal carcinoma in situ.      09/08/2015 Receptors her2    ER 100% positive, PR 100% positive, Ki-67 60%, HER-2 positive with copy # 4.25 and ratio 2.58      09/28/2015 Genetic Testing    Genetic testing was normal, and did not reveal a deleterious mutation in these genes.  Additionally, no variants of uncertain significance (VUSes) were found.  Genes tested include: ATM, BARD1, BRCA1, BRCA2, BRIP1, CDH1, CHEK2,  FANCC, MLH1, MSH2, MSH6, NBN, PALB2, PMS2, PTEN, RAD51C, RAD51D, TP53, and XRCC2.  This panel also includes deletion/duplication analysis (without sequencing) for one gene, EPCAM.      11/25/2015 Surgery    Right breast mastectomy and sentinel lymph node biopsy      11/25/2015 Pathology Results    Right breast invasive ductal carcinoma, grade 2, 2.0 cm, DCIS, intermediate grade, (+) LVI, margins were negative, 1 out of 3 lymph nodes were negative. Left breast ostectomy was  negative for malignancy.      01/06/2016 - 04/20/2016 Chemotherapy    adjuvant chemotherapy TCH P, every 3 weeks, for a total of 6 cycles. Herceptin and Perjeta were held for last two cycles due to drop of her EF on echo       05/25/2016 - 07/11/2016 Radiation Therapy    Adjuvant breast radiation [facility]): 1. The Right chest wall (4-field) was treated to 50.4 Gy in 28 fractions at 1.8 Gy per fraction. 2. The Right chest wall was boosted to 10 Gy in 5 fractions at 2 Gy per fraction.  3. The Right supraclavicular region was treated to 50.4 Gy in 28 fractions at 1.8 Gy per fraction.      06/08/2016 - 03/08/2017 Chemotherapy    maintenance Herceptin every 3 weeks, after clearance from cardiology.      08/11/2016 -  Anti-estrogen oral therapy    Exemestane 25 mg daily      11/13/2016 Imaging    Bone Scan IMPRESSION: 1. No evidence for metastatic disease. 2. Lumbar spine degenerative disc disease.      11/13/2016 Imaging    CT Abdomen W Contrast IMPRESSION: 1.8 cm hypervascular lesion in segment 6, unchanged. Given stability, a benign etiology such as FNH or flash filling hemangioma is favored. Metastasis is difficult to exclude but is considered less likely. 1.9 cm lesion in segment 3, compatible with a benign hemangioma. No findings specific for metastatic disease in  the abdomen. Please note that the pelvis was not imaged.      11/13/2016 Imaging    CT Head W Contrast IMPRESSION: Negative CT head with contrast.      01/02/2017 Imaging    Echo 01/02/2017 Impressions: - Compared to a prior study in 07/2016, the LVEF is stable however,   LV strain is worse at -15% with inferolateral strain abnormality.       INTERVAL HISTORY:  Ms. Deadwyler presents to the [facility] today for our initial meeting to review her survivorship care plan detailing her treatment course for breast cancer, as well as monitoring long-term side effects of that treatment, education regarding health  maintenance, screening, and overall wellness and health promotion.     Overall, Ms. Wren reports feeling quite well.  She is having issues over the past month with grip decrease in the right hand.  She says her hands hurt more in the past month.  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  This happens 6 times per shift.    She is taking Exemestane daily and does have some manageable joint pain.  She notes stiffness after sitting for a while.  She attempted to take Neratinib and couldn't tolerate it.  She had issues with incontinence, allergies, and nail changes.      REVIEW OF SYSTEMS:  Review of Systems  Constitutional: Negative for appetite change, chills, diaphoresis, fatigue, fever and unexpected weight change.  HENT:   Negative for hearing loss and lump/mass.   Eyes: Negative for eye problems and icterus.  Respiratory: Negative for chest tightness, cough and shortness of breath.   Cardiovascular: Negative for chest pain, leg swelling and palpitations.  Gastrointestinal: Negative for abdominal distention and abdominal pain.  Endocrine: Negative for hot flashes.  Genitourinary: Negative for difficulty urinating and dyspareunia.   Musculoskeletal: Positive for arthralgias.  Skin: Negative for itching and rash.  Neurological: Positive for numbness. Negative for dizziness, extremity weakness, headaches and speech difficulty.  Hematological: Negative for adenopathy. Does not bruise/bleed easily.  Psychiatric/Behavioral: Positive for sleep disturbance. Negative for depression. The patient is not nervous/anxious.   Breast: Denies any new nodularity, masses, tenderness   ONCOLOGY TREATMENT TEAM:  1. Surgeon:  Dr. Barry Dienes at [facility] 2. Medical Oncologist: Dr. Burr Medico  3. Radiation Oncologist: Dr. Lisbeth Renshaw    PAST MEDICAL/SURGICAL HISTORY:  Past Medical History:  Diagnosis Date    . Allergy   . Anxiety   . Breast cancer ([place]) 09/07/15   right breast  . Breast cancer of upper-outer quadrant of right female breast ([place]) 09/09/2015  . Cancer [facility])    left brast lumpectomy   . Complication of anesthesia    slow to wake up- BP was low, fainted next day-  . Depression   . Diabetes mellitus without complication ([place])   . Fibroid   . GERD (gastroesophageal reflux disease)   . Infection    UTI  . MVP (mitral valve prolapse)   . Neuromuscular disorder ([place])    sciatic nerve paion left side/leg  . UTI (lower urinary tract infection)    Past Surgical History:  Procedure Laterality Date  . ABDOMINAL HYSTERECTOMY    . BREAST RECONSTRUCTION WITH PLACEMENT OF TISSUE EXPANDER AND FLEX HD (ACELLULAR HYDRATED DERMIS) Bilateral 11/25/2015   Procedure: BILATERAL IMMEDIATE BREAST RECONSTRUCTION WITH PLACEMENT OF TISSUE EXPANDERS AND FLEX HD (ACELLULAR HYDRATED DERMIS);  Surgeon: Wallace Going, DO;  Location: [facility];  Service: Plastics;  Laterality: Bilateral;  . BREAST SURGERY  2004   left lumpectomy  . MASTECTOMY W/ SENTINEL NODE BIOPSY Bilateral 11/25/2015   Procedure: BILATERAL SKIN SPARING MASTECTOMIES WITH RIGHT SENTINEL LYMPH NODE BIOPSY;  Surgeon: Stark Klein, MD;  Location: [place];  Service: General;  Laterality: Bilateral;  . OOPHORECTOMY Bilateral   . PORTACATH PLACEMENT Left 12/20/2015   Procedure: INSERTION PORT-A-CATH, left chest;  Surgeon: Stark Klein, MD;  Location: [place];  Service: General;  Laterality: Left;     ALLERGIES:  Allergies  Allergen Reactions  . Lisinopril Cough  . Oxycodone Itching  . Percocet [Oxycodone-Acetaminophen] Itching     CURRENT MEDICATIONS:  Outpatient Encounter Prescriptions as of 05/14/2017  Medication Sig  . exemestane (AROMASIN) 25 MG tablet TAKE 1 TABLET BY MOUTH DAILY AFTER BREAKFAST.  . fluticasone (FLONASE) 50 MCG/ACT nasal spray Place 1 spray into both nostrils daily as needed  for allergies.   Marland Kitchen ibuprofen (ADVIL,MOTRIN) 800 MG tablet Take 800 mg by mouth daily as needed for headache or moderate pain.  . Iron-Vitamins (GERITOL) LIQD Take 30 mLs by mouth daily. Energy support  . traZODone (DESYREL) 50 MG tablet Take 1 tablet (50 mg total) by mouth at bedtime. May increase to 143m QHS if needed  . zolpidem (AMBIEN CR) 12.5 MG CR tablet Take 1 tablet (12.5 mg total) by mouth at bedtime as needed for sleep.  . [DISCONTINUED] bisoprolol (ZEBETA) 5 MG tablet Take 0.5 tablets (2.5 mg total) by mouth at bedtime.  . [DISCONTINUED] losartan (COZAAR) 25 MG tablet Take 0.5 tablets (12.5 mg total) by mouth daily.  . [DISCONTINUED] Neratinib Maleate (NERLYNX) 40 MG tablet Take 6 tablets (240 mg total) by mouth daily. Take with food. (Patient not taking: Reported on 04/19/2017)   No facility-administered encounter medications on file as of 05/14/2017.      ONCOLOGIC FAMILY HISTORY:  Family History  Problem Relation Age of Onset  . Hypertension Mother   . Stroke Mother   . Alzheimer's disease Mother   . Heart disease Father   . Stroke Father   . Heart attack Father   . Breast cancer Sister 578      double mastectomy  . Other Sister        "stomach tumor that wrapped around reproductive organs"; dx. 427s required partial hysterectomy  . Alzheimer's disease Maternal Grandmother   . Alzheimer's disease Paternal Grandmother   . Other Other        had to have lymph nodes removed and radiation; dx. 339s . Cancer Maternal Aunt        unspecified type; dx. <50  . Colon cancer Maternal Uncle        dx. 619s . Breast cancer Cousin        dx. 50s     GENETIC COUNSELING/TESTING: Genetic testing was normal, and did not reveal a deleterious mutation in these genes.  Additionally, no variants of uncertain significance (VUSes) were found.  Genes tested include: ATM, BARD1, BRCA1, BRCA2, BRIP1, CDH1, CHEK2,  FANCC, MLH1, MSH2, MSH6, NBN, PALB2, PMS2, PTEN, RAD51C, RAD51D, TP53, and  XRCC2.  This panel also includes deletion/duplication analysis (without sequencing) for one gene, EPCAM.   SOCIAL HISTORY:  Joslynn Bouffard is single [place]   Ms. 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     PHYSICAL EXAMINATION:  Vital Signs:   Vitals:   05/14/17 1307  BP: 114/70  Pulse: 77  Resp: 18  Temp: 97.8 F (36.6 C)   Filed Weights   05/14/17 1307  Weight: 140 lb 6.4 oz (63.7 kg)   General: Well-nourished, well-appearing female in no acute distress.  She is unaccompanied today.   HEENT: Head is normocephalic.  Pupils equal and reactive to light. Conjunctivae clear without exudate.  Sclerae anicteric. Oral mucosa is pink, moist.  Oropharynx is pink without lesions or erythema.  Lymph: No cervical, supraclavicular, or infraclavicular lymphadenopathy noted on palpation.  Cardiovascular: Regular rate and rhythm.Marland Kitchen Respiratory: Clear to auscultation bilaterally. Chest expansion symmetric; breathing non-labored.  GI: Abdomen soft and round; non-tender, non-distended. Bowel sounds normoactive.  GU: Deferred.  Neuro: No focal deficits. Steady gait.  Psych: Mood and affect normal and appropriate for situation.  Extremities: No edema. Strength 5/5 x 4 ext, including hand grips Skin: Warm and dry.  LABORATORY DATA:  None for this visit.  DIAGNOSTIC IMAGING:  None for this visit.      ASSESSMENT AND PLAN:  Ms.. Sax is a pleasant 51 y.o. female with Stage IIA right breast invasive ductal carcinoma, ER+/PR+/HER2-, diagnosed in 08/2015, treated with mastectomy, adjuvant chemotherapy, adjuvant radiation therapy, maintenance Herceptin x 1 year, and anti-estrogen therapy with Exemestane starting in 08/2016.  She presents to the Survivorship Clinic for our initial meeting and routine follow-up post-completion of treatment for  breast cancer.    1. Stage IIA right breast cancer:  Ms. Messner is continuing to recover from definitive treatment for breast cancer. She will follow-up with her medical oncologist, Dr. Burr Medico in 08/2017 with history and physical exam per surveillance protocol.  She will continue her anti-estrogen therapy with Exemestane. Thus far, she is tolerating the Exemestane well, with minimal side effects. She was instructed to make Dr. Burr Medico or myself aware if she begins to experience any worsening side effects of the medication and I could see her back in clinic to help manage those side effects, as needed. Today, a comprehensive survivorship care plan and treatment summary was reviewed with the patient today detailing her breast cancer diagnosis, treatment course, potential late/long-term effects of treatment, appropriate follow-up care with recommendations for the future, and patient education resources.  A copy of this summary, along with a letter will be sent to the patient's primary care provider via mail/fax/In Basket message after today's visit.    2. Hot flashes/neuropathy:  Gabapentin 138m to taper up to 3031mQHS PRN.  This may help with her intermittent neuropathy as well.    3. Right hand strength: I do not detect a difference.  ? Related to joint discomfort in hands from exemestane or neuropathy.  Will monitor for now, however she knows to contact usKoreaf it worsens, or progresses.    4. Bone health:  Given Ms. Hodges's age/history of breast cancer and her current treatment regimen including anti-estrogen therapy with Exemestane, she is at risk for bone demineralization.  Her last DEXA scan was 11/17/2016, was normal.  She was given education on specific activities to promote bone health.  5. Cancer screening:  Due to Ms. Clabo's history and her age, she should receive screening for skin cancers, colon cancer.  The information and recommendations are listed on the patient's comprehensive care  plan/treatment summary and were reviewed in detail with the patient.    6.  Health maintenance and wellness promotion: Ms. Carlini was encouraged to consume 5-7 servings of fruits and vegetables per day. We reviewed the "Nutrition Rainbow" handout, as well as the handout "Take Control of Your Health and Reduce Your Cancer Risk" from the [place].  She was also encouraged to engage in moderate to vigorous exercise for 30 minutes per day most days of the week. We discussed the [employer] fitness program, which is designed for cancer survivors to help them become more physically fit after cancer treatments.  She was instructed to limit her alcohol consumption and continue to abstain from tobacco use.     7. Support services/counseling: It is not uncommon for this period of the patient's cancer care trajectory to be one of many emotions and stressors.  We discussed an opportunity for her to participate in the next session of [facility] ("Finding Your New Normal") support group series designed for patients after they have completed treatment.   Ms. Belland was encouraged to take advantage of our many other support services programs, support groups, and/or counseling in coping with her new life as a cancer survivor after completing anti-cancer treatment.  She was offered support today through active listening and expressive supportive counseling.  She was given information regarding our available services and encouraged to contact me with any questions or for help enrolling in any of our support group/programs.    Dispo:   -Return to cancer center 08/20/17 for follow up with Dr. Burr Medico as scheduled  -She is welcome to return back to the Survivorship Clinic at any time; no additional follow-up needed at this time.  -Consider referral back to survivorship as a long-term survivor for continued surveillance  A total of (30) minutes of face-to-face time was spent with this patient with greater than 50% of  that time in counseling and care-coordination.   Gardenia Phlegm, [place] [phone]   Note: PRIMARY CARE PROVIDER Kristie Cowman, [place] [phone]

## 2017-05-23 ENCOUNTER — Telehealth: Payer: Self-pay

## 2017-05-23 NOTE — Telephone Encounter (Signed)
Spoke directly withp  pt and she is thinking that last time she had to wait for an exemestane refill she got really emotionally volatile. She is thinking these symptoms may be coming from the gabapentin. She wants to stop the gabapentin first and see if the sx go away and if that does not help then stop the exemestane. (gabapentin half life is 5-7 hours). She will call Dr Burr Medico after she sees her response to these measures. She is aware that Dr Burr Medico will consider switching exemestane to tamoxifen.

## 2017-05-23 NOTE — Telephone Encounter (Signed)
OK to stop exemestane if she feels her symptoms are related to it and severe, may consider to switch her to Tamoxifen when she recovers. Let her call us or see me in 3-4 weeks to discuss tamoxifen.   Truitt Merle MD

## 2017-05-23 NOTE — Telephone Encounter (Signed)
Pt called stating "it hurts all over and my hands are swollen and my feet are swollen". She is attributing this to the exemestane.  Attempted to call pt back and lvm

## 2017-05-24 ENCOUNTER — Encounter (HOSPITAL_BASED_OUTPATIENT_CLINIC_OR_DEPARTMENT_OTHER): Payer: Self-pay | Admitting: *Deleted

## 2017-05-28 ENCOUNTER — Encounter (HOSPITAL_BASED_OUTPATIENT_CLINIC_OR_DEPARTMENT_OTHER): Payer: Self-pay | Admitting: Anesthesiology

## 2017-05-28 NOTE — Anesthesia Preprocedure Evaluation (Addendum)
Anesthesia Evaluation  Patient identified by MRN, date of birth, ID band Patient awake    Reviewed: Allergy & Precautions, NPO status , Patient's Chart, lab work & pertinent test results  Airway Mallampati: II       Dental  (+) Teeth Intact   Pulmonary neg pulmonary ROS,    Pulmonary exam normal        Cardiovascular negative cardio ROS  + Valvular Problems/Murmurs MVP  Rhythm:Regular Rate:Normal     Neuro/Psych  Headaches, PSYCHIATRIC DISORDERS Anxiety Depression  Neuromuscular disease    GI/Hepatic Neg liver ROS, GERD  ,  Endo/Other  diabetes  Renal/GU negative Renal ROS  negative genitourinary   Musculoskeletal negative musculoskeletal ROS (+)   Abdominal   Peds negative pediatric ROS (+)  Hematology negative hematology ROS (+)   Anesthesia Other Findings Day of surgery medications reviewed with the patient.  Reproductive/Obstetrics negative OB ROS                            Anesthesia Physical Anesthesia Plan  ASA: II  Anesthesia Plan: MAC   Post-op Pain Management:    Induction: Intravenous  PONV Risk Score and Plan: Propofol  Airway Management Planned:   Additional Equipment:   Intra-op Plan:   Post-operative Plan:   Informed Consent: I have reviewed the patients History and Physical, chart, labs and discussed the procedure including the risks, benefits and alternatives for the proposed anesthesia with the patient or authorized representative who has indicated his/her understanding and acceptance.     Plan Discussed with:   Anesthesia Plan Comments:         Anesthesia Quick Evaluation

## 2017-05-29 ENCOUNTER — Ambulatory Visit (HOSPITAL_BASED_OUTPATIENT_CLINIC_OR_DEPARTMENT_OTHER): Payer: BLUE CROSS/BLUE SHIELD | Admitting: Anesthesiology

## 2017-05-29 ENCOUNTER — Ambulatory Visit (HOSPITAL_BASED_OUTPATIENT_CLINIC_OR_DEPARTMENT_OTHER)
Admission: RE | Admit: 2017-05-29 | Discharge: 2017-05-29 | Disposition: A | Discharge status: Home / Self Care | Payer: BLUE CROSS/BLUE SHIELD | Source: Ambulatory Visit | Attending: General Surgery | Admitting: General Surgery

## 2017-05-29 ENCOUNTER — Encounter (HOSPITAL_BASED_OUTPATIENT_CLINIC_OR_DEPARTMENT_OTHER): Payer: Self-pay | Admitting: Certified Registered"

## 2017-05-29 ENCOUNTER — Encounter (HOSPITAL_BASED_OUTPATIENT_CLINIC_OR_DEPARTMENT_OTHER)
Admission: RE | Disposition: A | Discharge status: Home / Self Care | Payer: Self-pay | Source: Ambulatory Visit | Attending: General Surgery

## 2017-05-29 DIAGNOSIS — Z885 Allergy status to narcotic agent status: Secondary | ICD-10-CM | POA: Insufficient documentation

## 2017-05-29 DIAGNOSIS — Z9013 Acquired absence of bilateral breasts and nipples: Secondary | ICD-10-CM | POA: Diagnosis not present

## 2017-05-29 DIAGNOSIS — Z803 Family history of malignant neoplasm of breast: Secondary | ICD-10-CM | POA: Diagnosis not present

## 2017-05-29 DIAGNOSIS — Z888 Allergy status to other drugs, medicaments and biological substances status: Secondary | ICD-10-CM | POA: Insufficient documentation

## 2017-05-29 DIAGNOSIS — Z853 Personal history of malignant neoplasm of breast: Secondary | ICD-10-CM | POA: Diagnosis not present

## 2017-05-29 DIAGNOSIS — Z452 Encounter for adjustment and management of vascular access device: Secondary | ICD-10-CM | POA: Insufficient documentation

## 2017-05-29 DIAGNOSIS — Z79811 Long term (current) use of aromatase inhibitors: Secondary | ICD-10-CM | POA: Insufficient documentation

## 2017-05-29 DIAGNOSIS — Z79899 Other long term (current) drug therapy: Secondary | ICD-10-CM | POA: Diagnosis not present

## 2017-05-29 HISTORY — PX: PORT-A-CATH REMOVAL: SHX5289

## 2017-05-29 SURGERY — REMOVAL PORT-A-CATH
Anesthesia: Monitor Anesthesia Care | Site: Chest | Laterality: Left

## 2017-05-29 MED ORDER — DEXAMETHASONE SODIUM PHOSPHATE 10 MG/ML IJ SOLN
INTRAMUSCULAR | Status: AC
  Filled 2017-05-29: qty 1

## 2017-05-29 MED ORDER — PROPOFOL 500 MG/50ML IV EMUL
INTRAVENOUS | Status: AC
  Filled 2017-05-29: qty 50

## 2017-05-29 MED ORDER — CEFAZOLIN SODIUM-DEXTROSE 2-4 GM/100ML-% IV SOLN
2.0000 g | INTRAVENOUS | Status: AC
  Administered 2017-05-29: 2 g via INTRAVENOUS

## 2017-05-29 MED ORDER — SCOPOLAMINE 1 MG/3DAYS TD PT72: 1.0000 | MEDICATED_PATCH | Freq: Once | TRANSDERMAL | Status: DC | PRN

## 2017-05-29 MED ORDER — LIDOCAINE 2% (20 MG/ML) 5 ML SYRINGE
INTRAMUSCULAR | Status: AC
  Filled 2017-05-29: qty 5

## 2017-05-29 MED ORDER — MIDAZOLAM HCL 2 MG/2ML IJ SOLN
INTRAMUSCULAR | Status: AC
  Filled 2017-05-29: qty 2

## 2017-05-29 MED ORDER — LIDOCAINE-EPINEPHRINE 0.5 %-1:200000 IJ SOLN
INTRAMUSCULAR | Status: AC
  Filled 2017-05-29: qty 4

## 2017-05-29 MED ORDER — PROPOFOL 10 MG/ML IV BOLUS
INTRAVENOUS | Status: DC | PRN
  Administered 2017-05-29 (×2): 30 mg via INTRAVENOUS

## 2017-05-29 MED ORDER — DEXAMETHASONE SODIUM PHOSPHATE 10 MG/ML IJ SOLN
INTRAMUSCULAR | Status: DC | PRN
  Administered 2017-05-29: 4 mg via INTRAVENOUS

## 2017-05-29 MED ORDER — BUPIVACAINE HCL (PF) 0.25 % IJ SOLN
INTRAMUSCULAR | Status: AC
  Filled 2017-05-29: qty 90

## 2017-05-29 MED ORDER — 0.9 % SODIUM CHLORIDE (POUR BTL) OPTIME
TOPICAL | Status: DC | PRN
  Administered 2017-05-29: 1000 mL

## 2017-05-29 MED ORDER — FENTANYL CITRATE (PF) 100 MCG/2ML IJ SOLN
50.0000 ug | INTRAMUSCULAR | Status: DC | PRN
  Administered 2017-05-29 (×2): 50 ug via INTRAVENOUS

## 2017-05-29 MED ORDER — OXYCODONE HCL 5 MG/5ML PO SOLN: 5.0000 mg | Freq: Once | ORAL | Status: DC | PRN

## 2017-05-29 MED ORDER — OXYCODONE HCL 5 MG PO TABS: 5.0000 mg | ORAL_TABLET | Freq: Once | ORAL | Status: DC | PRN

## 2017-05-29 MED ORDER — ONDANSETRON HCL 4 MG/2ML IJ SOLN
INTRAMUSCULAR | Status: DC | PRN
  Administered 2017-05-29: 4 mg via INTRAVENOUS

## 2017-05-29 MED ORDER — LIDOCAINE HCL (CARDIAC) 20 MG/ML IV SOLN
INTRAVENOUS | Status: DC | PRN
  Administered 2017-05-29: 25 mg via INTRAVENOUS

## 2017-05-29 MED ORDER — MIDAZOLAM HCL 2 MG/2ML IJ SOLN
1.0000 mg | INTRAMUSCULAR | Status: DC | PRN
  Administered 2017-05-29: 2 mg via INTRAVENOUS

## 2017-05-29 MED ORDER — FENTANYL CITRATE (PF) 100 MCG/2ML IJ SOLN
INTRAMUSCULAR | Status: AC
  Filled 2017-05-29: qty 2

## 2017-05-29 MED ORDER — LIDOCAINE HCL 1 % IJ SOLN
INTRAMUSCULAR | Status: DC | PRN
  Administered 2017-05-29: 5 mL via INTRAMUSCULAR

## 2017-05-29 MED ORDER — CHLORHEXIDINE GLUCONATE CLOTH 2 % EX PADS: 6.0000 | MEDICATED_PAD | Freq: Once | CUTANEOUS | Status: DC

## 2017-05-29 MED ORDER — LACTATED RINGERS IV SOLN
INTRAVENOUS | Status: DC
  Administered 2017-05-29 (×2): via INTRAVENOUS

## 2017-05-29 MED ORDER — ONDANSETRON HCL 4 MG/2ML IJ SOLN
INTRAMUSCULAR | Status: AC
  Filled 2017-05-29: qty 2

## 2017-05-29 MED ORDER — CEFAZOLIN SODIUM-DEXTROSE 2-4 GM/100ML-% IV SOLN
INTRAVENOUS | Status: AC
  Filled 2017-05-29: qty 100

## 2017-05-29 MED ORDER — MEPERIDINE HCL 25 MG/ML IJ SOLN: 6.2500 mg | INTRAMUSCULAR | Status: DC | PRN

## 2017-05-29 MED ORDER — FENTANYL CITRATE (PF) 100 MCG/2ML IJ SOLN: 25.0000 ug | INTRAMUSCULAR | Status: DC | PRN

## 2017-05-29 MED ORDER — PROMETHAZINE HCL 25 MG/ML IJ SOLN: 6.2500 mg | INTRAMUSCULAR | Status: DC | PRN

## 2017-05-29 MED ORDER — LIDOCAINE HCL (PF) 1 % IJ SOLN
INTRAMUSCULAR | Status: AC
  Filled 2017-05-29: qty 120

## 2017-05-29 MED ORDER — LACTATED RINGERS IV SOLN: INTRAVENOUS | Status: DC

## 2017-05-29 SURGICAL SUPPLY — 32 items
BLADE HEX COATED 2.75 (ELECTRODE) ×3 IMPLANT
BLADE SURG 15 STRL LF DISP TIS (BLADE) ×1 IMPLANT
BLADE SURG 15 STRL SS (BLADE) ×2
CHLORAPREP W/TINT 26ML (MISCELLANEOUS) ×3 IMPLANT
COVER BACK TABLE 60X90IN (DRAPES) ×3 IMPLANT
COVER MAYO STAND STRL (DRAPES) ×3 IMPLANT
DERMABOND ADVANCED (GAUZE/BANDAGES/DRESSINGS) ×2
DERMABOND ADVANCED .7 DNX12 (GAUZE/BANDAGES/DRESSINGS) ×1 IMPLANT
DRAPE LAPAROTOMY 100X72 PEDS (DRAPES) ×3 IMPLANT
DRAPE UTILITY XL STRL (DRAPES) ×3 IMPLANT
ELECT REM PT RETURN 9FT ADLT (ELECTROSURGICAL) ×3
ELECTRODE REM PT RTRN 9FT ADLT (ELECTROSURGICAL) ×1 IMPLANT
GLOVE BIO SURGEON STRL SZ 6 (GLOVE) ×3 IMPLANT
GLOVE BIOGEL PI IND STRL 6.5 (GLOVE) ×1 IMPLANT
GLOVE BIOGEL PI IND STRL 8.5 (GLOVE) ×1 IMPLANT
GLOVE BIOGEL PI INDICATOR 6.5 (GLOVE) ×2
GLOVE BIOGEL PI INDICATOR 8.5 (GLOVE) ×2
GLOVE SURG SS PI 6.5 STRL IVOR (GLOVE) ×3 IMPLANT
GLOVE SURG SS PI 8.0 STRL IVOR (GLOVE) ×3 IMPLANT
GOWN STRL REUS W/ TWL LRG LVL3 (GOWN DISPOSABLE) ×1 IMPLANT
GOWN STRL REUS W/TWL 2XL LVL3 (GOWN DISPOSABLE) ×6 IMPLANT
GOWN STRL REUS W/TWL LRG LVL3 (GOWN DISPOSABLE) ×2
NEEDLE HYPO 25X1 1.5 SAFETY (NEEDLE) ×3 IMPLANT
NS IRRIG 1000ML POUR BTL (IV SOLUTION) ×3 IMPLANT
PACK BASIN DAY SURGERY FS (CUSTOM PROCEDURE TRAY) ×3 IMPLANT
PENCIL BUTTON HOLSTER BLD 10FT (ELECTRODE) ×3 IMPLANT
SUT MNCRL AB 4-0 PS2 18 (SUTURE) ×3 IMPLANT
SUT VIC AB 3-0 SH 27 (SUTURE) ×2
SUT VIC AB 3-0 SH 27X BRD (SUTURE) ×1 IMPLANT
SYR CONTROL 10ML LL (SYRINGE) ×3 IMPLANT
TOWEL OR 17X24 6PK STRL BLUE (TOWEL DISPOSABLE) ×3 IMPLANT
TOWEL OR NON WOVEN STRL DISP B (DISPOSABLE) ×3 IMPLANT

## 2017-05-29 NOTE — Anesthesia Procedure Notes (Addendum)
Procedure Name: MAC Date/Time: 05/29/2017 7:50 AM Performed by: Baxter Flattery Pre-anesthesia Checklist: Patient identified, Emergency Drugs available, Suction available and Patient being monitored Patient Re-evaluated:Patient Re-evaluated prior to inductionOxygen Delivery Method: Simple face mask Preoxygenation: Pre-oxygenation with 100% oxygen Intubation Type: IV induction

## 2017-05-29 NOTE — Anesthesia Procedure Notes (Signed)
Performed by: Baxter Flattery

## 2017-05-29 NOTE — Anesthesia Postprocedure Evaluation (Signed)
Anesthesia Post Note  Patient: Sandra James  Procedure(s) Performed: Procedure(s) (LRB): PORT REMOVAL (Left)     Patient location during evaluation: PACU Anesthesia Type: MAC Level of consciousness: awake and alert Pain management: pain level controlled Vital Signs Assessment: post-procedure vital signs reviewed and stable Respiratory status: spontaneous breathing, nonlabored ventilation, respiratory function stable and patient connected to nasal cannula oxygen Cardiovascular status: stable and blood pressure returned to baseline Anesthetic complications: no    Last Vitals:  Vitals:   05/29/17 0845 05/29/17 0902  BP: 105/73 119/76  Pulse: 63 68  Resp: 19 20  Temp:  36.6 C    Last Pain:  Vitals:   05/29/17 0643  TempSrc: Oral  PainSc: 0-No pain                 Effie Berkshire

## 2017-05-29 NOTE — Op Note (Signed)
  PRE-OPERATIVE DIAGNOSIS:  un-needed Port-A-Cath for right breast cancer  POST-OPERATIVE DIAGNOSIS:  Same   PROCEDURE:  Procedure(s):  REMOVAL PORT-A-CATH  SURGEON:  Surgeon(s):  Stark Klein, MD  ANESTHESIA:   MAC + local  EBL:   Minimal  SPECIMEN:  None  Complications : none known  Procedure:   Pt was  identified in the holding area and taken to the operating room where she was placed supine on the operating room table.  MAC anesthesia was induced.  The left upper chest was prepped and draped.  The prior incision was anesthetized with local anesthetic.  The incision was opened with a #15 blade.  The subcutaneous tissue was divided with the cautery.  The port was identified and the capsule opened.  The four 2-0 prolene sutures were removed.  The port was then removed and pressure held on the tract.  The catheter appeared intact without evidence of breakage, length was 21.5 cm.  The wound was inspected for hemostasis, which was achieved with cautery.  The wound was closed with 3-0 vicryl deep dermal interrupted sutures and 4-0 Monocryl running subcuticular suture.  The wound was cleaned, dried, and dressed with dermabond.  The patient was awakened from anesthesia and taken to the PACU in stable condition.  Needle, sponge, and instrument counts are correct.

## 2017-05-29 NOTE — Discharge Instructions (Signed)
[place] Office Phone Number [phone]   POST OP INSTRUCTIONS  Always review your discharge instruction sheet given to you by the facility where your surgery was performed.  IF YOU HAVE DISABILITY OR FAMILY LEAVE FORMS, YOU MUST BRING THEM TO THE OFFICE FOR PROCESSING.  DO NOT GIVE THEM TO YOUR DOCTOR.  1. A prescription for pain medication may be given to you upon discharge.  Take your pain medication as prescribed, if needed.  If narcotic pain medicine is not needed, then you may take acetaminophen (Tylenol) or ibuprofen (Advil) as needed. 2. Take your usually prescribed medications unless otherwise directed 3. If you need a refill on your pain medication, please contact your pharmacy.  They will contact our office to request authorization.  Prescriptions will not be filled after 5pm or on week-ends. 4. You should eat very light the first 24 hours after surgery, such as soup, crackers, pudding, etc.  Resume your normal diet the day after surgery 5. It is common to experience some constipation if taking pain medication after surgery.  Increasing fluid intake and taking a stool softener will usually help or prevent this problem from occurring.  A mild laxative (Milk of Magnesia or Miralax) should be taken according to package directions if there are no bowel movements after 48 hours. 6. You may shower in 48 hours.  The surgical glue will flake off in 2-3 weeks.   7. ACTIVITIES:  No strenuous activity or heavy lifting for 1 week.   a. You may drive when you no longer are taking prescription pain medication, you can comfortably wear a seatbelt, and you can safely maneuver your car and apply brakes. b. RETURN TO WORK:  __________1 week or earlier if no heavy lifting or strenuous activity._______________ Sandra James should see your doctor in the office for a follow-up appointment approximately three-four weeks after your surgery.    WHEN TO CALL YOUR DOCTOR: 1. Fever over  101.0 2. Nausea and/or vomiting. 3. Extreme swelling or bruising. 4. Continued bleeding from incision. 5. Increased pain, redness, or drainage from the incision.  The clinic staff is available to answer your questions during regular business hours.  Please dont hesitate to call and ask to speak to one of the nurses for clinical concerns.  If you have a medical emergency, go to the nearest emergency room or call 911.  A surgeon from [facility] is always on call at the hospital.  For further questions, please visit [email or web address]   Post Anesthesia Home Care Instructions  Activity: Get plenty of rest for the remainder of the day. A responsible individual must stay with you for 24 hours following the procedure.  For the next 24 hours, DO NOT: -Drive a car -Paediatric nurse -Drink alcoholic beverages -Take any medication unless instructed by your physician -Make any legal decisions or sign important papers.  Meals: Start with liquid foods such as gelatin or soup. Progress to regular foods as tolerated. Avoid greasy, spicy, heavy foods. If nausea and/or vomiting occur, drink only clear liquids until the nausea and/or vomiting subsides. Call your physician if vomiting continues.  Special Instructions/Symptoms: Your throat may feel dry or sore from the anesthesia or the breathing tube placed in your throat during surgery. If this causes discomfort, gargle with warm salt water. The discomfort should disappear within 24 hours.  If you had a scopolamine patch placed behind your ear for the management of post- operative nausea and/or vomiting:  1. The medication in the patch is  effective for 72 hours, after which it should be removed.  Wrap patch in a tissue and discard in the trash. Wash hands thoroughly with soap and water. 2. You may remove the patch earlier than 72 hours if you experience unpleasant side effects which may include dry mouth, dizziness or visual  disturbances. 3. Avoid touching the patch. Wash your hands with soap and water after contact with the patch.

## 2017-05-29 NOTE — Transfer of Care (Signed)
Immediate Anesthesia Transfer of Care Note  Patient: Sandra James  Procedure(s) Performed: Procedure(s): PORT REMOVAL (Left)  Patient Location: PACU  Anesthesia Type:MAC  Level of Consciousness: awake, alert  and oriented  Airway & Oxygen Therapy: Patient Spontanous Breathing and Patient connected to face mask oxygen  Post-op Assessment: Report given to RN, Post -op Vital signs reviewed and stable and Patient moving all extremities  Post vital signs: Reviewed and stable  Last Vitals:  Vitals:   05/29/17 0643  BP: 100/66  Pulse: 78  Resp: 17  Temp: 36.9 C    Last Pain:  Vitals:   05/29/17 0643  TempSrc: Oral  PainSc: 0-No pain         Complications: No apparent anesthesia complications

## 2017-05-29 NOTE — H&P (Signed)
Sandra James is an 51 y.o. female.   Chief Complaint: left breast cancer HPI:  Pt is a 51 yo F with right breast cancer and history of left breast cancer.  She is s/p chemotherapy and desires port removal.    Past Medical History:  Diagnosis Date  . Allergy   . Anxiety   . Breast cancer ([place]) 09/07/15   right breast  . Breast cancer of upper-outer quadrant of right female breast ([place]) 09/09/2015  . Cancer [facility])    left brast lumpectomy   . Complication of anesthesia    slow to wake up- BP was low, fainted next day-  . Depression   . Fibroid   . GERD (gastroesophageal reflux disease)   . Infection    UTI  . MVP (mitral valve prolapse)   . Neuromuscular disorder ([place])    sciatic nerve paion left side/leg  . UTI (lower urinary tract infection)     Past Surgical History:  Procedure Laterality Date  . ABDOMINAL HYSTERECTOMY    . BREAST RECONSTRUCTION WITH PLACEMENT OF TISSUE EXPANDER AND FLEX HD (ACELLULAR HYDRATED DERMIS) Bilateral 11/25/2015   Procedure: BILATERAL IMMEDIATE BREAST RECONSTRUCTION WITH PLACEMENT OF TISSUE EXPANDERS AND FLEX HD (ACELLULAR HYDRATED DERMIS);  Surgeon: Wallace Going, DO;  Location: [place];  Service: Plastics;  Laterality: Bilateral;  . BREAST SURGERY  2004   left lumpectomy  . MASTECTOMY W/ SENTINEL NODE BIOPSY Bilateral 11/25/2015   Procedure: BILATERAL SKIN SPARING MASTECTOMIES WITH RIGHT SENTINEL LYMPH NODE BIOPSY;  Surgeon: Stark Klein, MD;  Location: [place];  Service: General;  Laterality: Bilateral;  . OOPHORECTOMY Bilateral   . PORTACATH PLACEMENT Left 12/20/2015   Procedure: INSERTION PORT-A-CATH, left chest;  Surgeon: Stark Klein, MD;  Location: [facility] OR;  Service: General;  Laterality: Left;    Family History  Problem Relation Age of Onset  . Hypertension Mother   . Stroke Mother   . Alzheimer's disease Mother   . Heart disease Father   . Stroke Father   . Heart attack Father   . Breast  cancer Sister 77       double mastectomy  . Other Sister        "stomach tumor that wrapped around reproductive organs"; dx. 31s; required partial hysterectomy  . Alzheimer's disease Maternal Grandmother   . Alzheimer's disease Paternal Grandmother   . Other Other        had to have lymph nodes removed and radiation; dx. 36s  . Cancer Maternal Aunt        unspecified type; dx. <50  . Colon cancer Maternal Uncle        dx. 54s  . Breast cancer Cousin        dx. 22s   Social History:  reports that she has never smoked. She has never used smokeless tobacco. She reports that she drinks about 2.4 oz of alcohol per week . She reports that she does not use drugs.  Allergies:  Allergies  Allergen Reactions  . Lisinopril Cough  . Oxycodone Itching  . Percocet [Oxycodone-Acetaminophen] Itching    Medications Prior to Admission  Medication Sig Dispense Refill  . Calcium Carbonate-Vitamin D (CALTRATE 600+D) 600-400 MG-UNIT tablet Take 1 tablet by mouth daily.    Marland Kitchen exemestane (AROMASIN) 25 MG tablet TAKE 1 TABLET BY MOUTH DAILY AFTER BREAKFAST. 30 tablet 0  . PRENATAL VIT-FE-FA-CA-DSS-DHA PO Take by mouth.    . zolpidem (AMBIEN CR) 12.5 MG CR tablet Take  1 tablet (12.5 mg total) by mouth at bedtime as needed for sleep. 30 tablet 2  . ibuprofen (ADVIL,MOTRIN) 800 MG tablet Take 800 mg by mouth daily as needed for headache or moderate pain.      No results found for this or any previous visit (from the past 48 hour(s)). No results found.  Review of Systems  All other systems reviewed and are negative.   Blood pressure 100/66, pulse 78, temperature 98.5 F (36.9 C), temperature source Oral, resp. rate 17, height 5\' 6"  (1.676 m), weight 63.2 kg (139 lb 6.4 oz), SpO2 100 %. Physical Exam  Constitutional: She is oriented to person, place, and time. She appears well-developed and well-nourished. No distress.  HENT:  Head: Normocephalic and atraumatic.  Eyes: Conjunctivae are normal. Pupils  are equal, round, and reactive to light.  Neck: Normal range of motion. No thyromegaly present.  Cardiovascular: Normal rate.   Respiratory: Effort normal. No respiratory distress.  GI: Soft.  Musculoskeletal: Normal range of motion.  Neurological: She is alert and oriented to person, place, and time.  Skin: Skin is warm and dry.  Psychiatric: She has a normal mood and affect. Her behavior is normal. Judgment and thought content normal.     Assessment/Plan S/p chemotherapy for right breast cancer and history of left breast cancer Indwelling port.  Plan port removal. Discussed risks and benefits. Pt desires to proceed.  Stark Klein, MD 05/29/2017, 7:27 AM

## 2017-05-30 ENCOUNTER — Encounter (HOSPITAL_BASED_OUTPATIENT_CLINIC_OR_DEPARTMENT_OTHER): Payer: Self-pay | Admitting: General Surgery

## 2017-06-12 ENCOUNTER — Other Ambulatory Visit: Payer: Self-pay | Admitting: *Deleted

## 2017-06-12 DIAGNOSIS — Z170 Estrogen receptor positive status [ER+]: Principal | ICD-10-CM

## 2017-06-12 DIAGNOSIS — C50411 Malignant neoplasm of upper-outer quadrant of right female breast: Secondary | ICD-10-CM

## 2017-06-12 MED ORDER — EXEMESTANE 25 MG PO TABS
ORAL_TABLET | ORAL | 2 refills | Status: DC
Start: 2017-06-12 — End: 2017-09-07

## 2017-06-12 MED ORDER — ZOLPIDEM TARTRATE ER 12.5 MG PO TBCR: 12.5000 mg | EXTENDED_RELEASE_TABLET | Freq: Every evening | ORAL | 2 refills | Status: DC | PRN

## 2017-06-12 MED FILL — ZOLPIDEM TART ER 12.5 MG TA: 12.5 | 30 days supply | Qty: 30 | Fill #0

## 2017-06-12 MED FILL — EXEMESTANE 25 MG TABLET: 25 | 30 days supply | Qty: 30 | Fill #0

## 2017-06-21 ENCOUNTER — Other Ambulatory Visit (HOSPITAL_BASED_OUTPATIENT_CLINIC_OR_DEPARTMENT_OTHER): Payer: BLUE CROSS/BLUE SHIELD

## 2017-06-21 ENCOUNTER — Ambulatory Visit (HOSPITAL_BASED_OUTPATIENT_CLINIC_OR_DEPARTMENT_OTHER): Payer: BLUE CROSS/BLUE SHIELD | Admitting: Hematology

## 2017-06-21 ENCOUNTER — Telehealth: Payer: Self-pay | Admitting: *Deleted

## 2017-06-21 VITALS — BP 109/72 | HR 86 | Temp 98.1°F | Resp 17 | Ht 66.0 in | Wt 140.0 lb

## 2017-06-21 DIAGNOSIS — R413 Other amnesia: Secondary | ICD-10-CM

## 2017-06-21 DIAGNOSIS — R630 Anorexia: Secondary | ICD-10-CM

## 2017-06-21 DIAGNOSIS — R4189 Other symptoms and signs involving cognitive functions and awareness: Secondary | ICD-10-CM

## 2017-06-21 DIAGNOSIS — G4700 Insomnia, unspecified: Secondary | ICD-10-CM

## 2017-06-21 DIAGNOSIS — R51 Headache: Secondary | ICD-10-CM | POA: Diagnosis not present

## 2017-06-21 DIAGNOSIS — Z170 Estrogen receptor positive status [ER+]: Secondary | ICD-10-CM

## 2017-06-21 DIAGNOSIS — E119 Type 2 diabetes mellitus without complications: Secondary | ICD-10-CM

## 2017-06-21 DIAGNOSIS — C50411 Malignant neoplasm of upper-outer quadrant of right female breast: Secondary | ICD-10-CM

## 2017-06-21 DIAGNOSIS — F39 Unspecified mood [affective] disorder: Secondary | ICD-10-CM | POA: Diagnosis not present

## 2017-06-21 DIAGNOSIS — N951 Menopausal and female climacteric states: Secondary | ICD-10-CM | POA: Diagnosis not present

## 2017-06-21 LAB — COMPREHENSIVE METABOLIC PANEL
ALT: 11 U/L (ref 0–55)
ANION GAP: 10 meq/L (ref 3–11)
AST: 15 U/L (ref 5–34)
Albumin: 4.3 g/dL (ref 3.5–5.0)
Alkaline Phosphatase: 68 U/L (ref 40–150)
BUN: 11.3 mg/dL (ref 7.0–26.0)
CALCIUM: 10 mg/dL (ref 8.4–10.4)
CHLORIDE: 105 meq/L (ref 98–109)
CO2: 24 meq/L (ref 22–29)
CREATININE: 0.8 mg/dL (ref 0.6–1.1)
Glucose: 99 mg/dl (ref 70–140)
POTASSIUM: 3.3 meq/L — AB (ref 3.5–5.1)
Sodium: 140 mEq/L (ref 136–145)
Total Bilirubin: 1.06 mg/dL (ref 0.20–1.20)
Total Protein: 8 g/dL (ref 6.4–8.3)

## 2017-06-21 LAB — CBC WITH DIFFERENTIAL/PLATELET
BASO%: 0.3 % (ref 0.0–2.0)
BASOS ABS: 0 10*3/uL (ref 0.0–0.1)
EOS%: 1.2 % (ref 0.0–7.0)
Eosinophils Absolute: 0.1 10*3/uL (ref 0.0–0.5)
HEMATOCRIT: 37.5 % (ref 34.8–46.6)
HGB: 12.8 g/dL (ref 11.6–15.9)
LYMPH#: 1.7 10*3/uL (ref 0.9–3.3)
LYMPH%: 25.4 % (ref 14.0–49.7)
MCH: 31.1 pg (ref 25.1–34.0)
MCHC: 34.1 g/dL (ref 31.5–36.0)
MCV: 91.2 fL (ref 79.5–101.0)
MONO#: 0.5 10*3/uL (ref 0.1–0.9)
MONO%: 7.8 % (ref 0.0–14.0)
NEUT#: 4.3 10*3/uL (ref 1.5–6.5)
NEUT%: 65.3 % (ref 38.4–76.8)
PLATELETS: 292 10*3/uL (ref 145–400)
RBC: 4.11 10*6/uL (ref 3.70–5.45)
RDW: 12.9 % (ref 11.2–14.5)
WBC: 6.5 10*3/uL (ref 3.9–10.3)

## 2017-06-21 NOTE — Progress Notes (Signed)
[place]  [phone] [phone]  Clinic follow Up Note   Patient Care Team: Kristie Cowman, MD as PCP - General (Family Medicine) Stark Klein, MD as Consulting Physician (General Surgery) Truitt Merle, MD as Consulting Physician (Hematology) Mauro Kaufmann, RN as Registered Nurse Rockwell Germany, RN as Registered Nurse Causey, Charlestine Massed, NP as Nurse Practitioner (Hematology and Oncology) Kyung Rudd, MD as Consulting Physician (Radiation Oncology) 06/21/2017  CHIEF COMPLAINTS:  Memory loss, difficulty concentration   Oncology History   Cancer Staging Breast cancer of upper-outer quadrant of right female breast [facility]) Staging form: Breast, AJCC 7th Edition - Clinical stage from 09/15/2015: Stage IA (T1b, N0, M0) - Signed by Truitt Merle, MD on 12/16/2015 - Pathologic stage from 11/25/2015: Stage IIA (T1c, N1a, cM0) - Signed by Truitt Merle, MD on 12/16/2015       Breast cancer of upper-outer quadrant of right female breast ([place])   09/07/2015 Mammogram    Diagnostic mammogram and the ultrasound of the right breast showed a 0.8 cm lobulated mass in the right breast 11 to 12:00 position 9 cm from the nipple. No other lesions or adenopathy.      09/08/2015 Initial Diagnosis    Breast cancer of upper-outer quadrant of right female breast ([place])      09/08/2015 Initial Biopsy    Right breast mass at the upper outer quadrant core needle biopsy showed invasive ductal carcinoma, grade 2-3, ductal carcinoma in situ.      09/08/2015 Receptors her2    ER 100% positive, PR 100% positive, Ki-67 60%, HER-2 positive with copy # 4.25 and ratio 2.58      09/28/2015 Genetic Testing    Genetic testing was normal, and did not reveal a deleterious mutation in these genes.  Additionally, no variants of uncertain significance (VUSes) were found.  Genes tested include: ATM, BARD1, BRCA1, BRCA2, BRIP1, CDH1, CHEK2,  FANCC, MLH1, MSH2, MSH6, NBN, PALB2, PMS2, PTEN,  RAD51C, RAD51D, TP53, and XRCC2.  This panel also includes deletion/duplication analysis (without sequencing) for one gene, EPCAM.      11/25/2015 Surgery    Right breast mastectomy and sentinel lymph node biopsy      11/25/2015 Pathology Results    Right breast invasive ductal carcinoma, grade 2, 2.0 cm, DCIS, intermediate grade, (+) LVI, margins were negative, 1 out of 3 lymph nodes were negative. Left breast ostectomy was negative for malignancy.      01/06/2016 - 04/20/2016 Chemotherapy    adjuvant chemotherapy TCH P, every 3 weeks, for a total of 6 cycles. Herceptin and Perjeta were held for last two cycles due to drop of her EF on echo       05/25/2016 - 07/11/2016 Radiation Therapy    Adjuvant breast radiation [facility]): 1. The Right chest wall (4-field) was treated to 50.4 Gy in 28 fractions at 1.8 Gy per fraction. 2. The Right chest wall was boosted to 10 Gy in 5 fractions at 2 Gy per fraction.  3. The Right supraclavicular region was treated to 50.4 Gy in 28 fractions at 1.8 Gy per fraction.      06/08/2016 - 03/08/2017 Chemotherapy    maintenance Herceptin every 3 weeks, after clearance from cardiology.      08/11/2016 -  Anti-estrogen oral therapy    Exemestane 25 mg daily      11/13/2016 Imaging    Bone Scan IMPRESSION: 1. No evidence for metastatic disease. 2. Lumbar spine degenerative disc disease.  11/13/2016 Imaging    CT Abdomen W Contrast IMPRESSION: 1.8 cm hypervascular lesion in segment 6, unchanged. Given stability, a benign etiology such as FNH or flash filling hemangioma is favored. Metastasis is difficult to exclude but is considered less likely. 1.9 cm lesion in segment 3, compatible with a benign hemangioma. No findings specific for metastatic disease in the abdomen. Please note that the pelvis was not imaged.      11/13/2016 Imaging    CT Head W Contrast IMPRESSION: Negative CT head with contrast.      01/02/2017 Imaging    Echo  01/02/2017 Impressions: - Compared to a prior study in 07/2016, the LVEF is stable however,   LV strain is worse at -15% with inferolateral strain abnormality.       HISTORY OF PRESENTING ILLNESS:  Sandra James 51 y.o. female with past medical history of stage I left breast cancer, is here because of newly diagnosed right breast cancer. She presents to our multidisciplinary breast clinic by herself.  The right breast cancer was discovered by screening mammogram. She did not have any palpable mass, or any constitutional symptoms. She was diagnosed with stage I left breast cancer at age of 12, she had lumpectomy, radiation, and adjuvant chemotherapy and 5 years of tamoxifen. She was treated by Dr. Louann Sjogren in [place]. She moved to [facility] about year ago due to job change. She has been very compliant with annual screening mammogram.  She had hysterectomy and bilateral oophorectomy and sphincterectomy 5 years ago for heavy bleeding.. She has been having hot flashes since then, moderate, but manageable. She is single, lives alone, works for 2 to Kathryn has to go up children who live in [place].  CURRENT THERAPY: Exemestane 25 mg daily, started on 08/11/2016  INTERIM HISTORY Sandra James requested an urgent visit. She came into the clinic from work today feeling unusual. For the past week she has been having difficulty comprehending and having trouble with memory loss. She is currently having a headache now. Her sleep and vision is normal. Denies weakness, numbness, tingling or voices in her head. She has been having hot flashes.  She works two jobs but wants to take a break although financially she doesn't believe she can. She would like to come to some of the support meetings but she is not able to because of work.   MED ICAL HISTORY:  Past Medical History:  Diagnosis Date  . Allergy   . Anxiety   . Breast cancer ([place]) 09/07/15   right breast  . Breast cancer of upper-outer  quadrant of right female breast ([place]) 09/09/2015  . Cancer [facility])    left brast lumpectomy   . Complication of anesthesia    slow to wake up- BP was low, fainted next day-  . Depression   . Fibroid   . GERD (gastroesophageal reflux disease)   . Infection    UTI  . MVP (mitral valve prolapse)   . Neuromuscular disorder ([place])    sciatic nerve paion left side/leg  . UTI (lower urinary tract infection)     SURGICAL HISTORY: Past Surgical History:  Procedure Laterality Date  . ABDOMINAL HYSTERECTOMY    . BREAST RECONSTRUCTION WITH PLACEMENT OF TISSUE EXPANDER AND FLEX HD (ACELLULAR HYDRATED DERMIS) Bilateral 11/25/2015   Procedure: BILATERAL IMMEDIATE BREAST RECONSTRUCTION WITH PLACEMENT OF TISSUE EXPANDERS AND FLEX HD (ACELLULAR HYDRATED DERMIS);  Surgeon: Wallace Going, DO;  Location: [place];  Service: Plastics;  Laterality:  Bilateral;  . BREAST SURGERY  2004   left lumpectomy  . MASTECTOMY W/ SENTINEL NODE BIOPSY Bilateral 11/25/2015   Procedure: BILATERAL SKIN SPARING MASTECTOMIES WITH RIGHT SENTINEL LYMPH NODE BIOPSY;  Surgeon: Stark Klein, MD;  Location: [place];  Service: General;  Laterality: Bilateral;  . OOPHORECTOMY Bilateral   . PORT-A-CATH REMOVAL Left 05/29/2017   Procedure: PORT REMOVAL;  Surgeon: Stark Klein, MD;  Location: [place];  Service: General;  Laterality: Left;  . PORTACATH PLACEMENT Left 12/20/2015   Procedure: INSERTION PORT-A-CATH, left chest;  Surgeon: Stark Klein, MD;  Location: [place] [facility];  Service: General;  Laterality: Left;    SOCIAL HISTORY: Social History   Social History  . Marital Status: Single     Spouse Name: N/A  . Number of Children: 2 children, 88 daughter and 35 yo son    . Years of Education: N/A   Occupational History  . She is a Barista rep   Social History Main Topics  . Smoking status: Never Smoker   . Smokeless tobacco: Never Used  . Alcohol Use: Yes     Comment:  2-3/wk  . Drug Use: No  . Sexual Activity: Yes    Birth Control/ Protection: Surgical   Other Topics Concern  . Not on file   Social History Narrative    FAMILY HISTORY: Family History  Problem Relation Age of Onset  . Hypertension Mother   . Stroke Mother   . Alzheimer's disease Mother   . Heart disease Father   . Stroke Father   . Heart attack Father   . Breast cancer Sister 65       double mastectomy  . Other Sister        "stomach tumor that wrapped around reproductive organs"; dx. 98s; required partial hysterectomy  . Alzheimer's disease Maternal Grandmother   . Alzheimer's disease Paternal Grandmother   . Other Other        had to have lymph nodes removed and radiation; dx. 13s  . Cancer Maternal Aunt        unspecified type; dx. <50  . Colon cancer Maternal Uncle        dx. 58s  . Breast cancer Cousin        dx. 32s    ALLERGIES:  is allergic to lisinopril; oxycodone; and percocet [oxycodone-acetaminophen].  MEDICATIONS:  Current Outpatient Prescriptions  Medication Sig Dispense Refill  . Calcium Carbonate-Vitamin D (CALTRATE 600+D) 600-400 MG-UNIT tablet Take 1 tablet by mouth daily.    Marland Kitchen exemestane (AROMASIN) 25 MG tablet TAKE 1 TABLET BY MOUTH DAILY AFTER BREAKFAST. 30 tablet 2  . ibuprofen (ADVIL,MOTRIN) 800 MG tablet Take 800 mg by mouth daily as needed for headache or moderate pain.    Marland Kitchen PRENATAL VIT-FE-FA-CA-DSS-DHA PO Take by mouth.    . zolpidem (AMBIEN CR) 12.5 MG CR tablet Take 1 tablet (12.5 mg total) by mouth at bedtime as needed for sleep. 30 tablet 2   No current facility-administered medications for this visit.     REVIEW OF SYSTEMS:   Constitutional: Denies fevers, chills or abnormal night sweats (+) slight memory loss and difficulty comprehending (+)hot flashes Eyes: Denies blurriness of vision, double vision or watery eyes Ears, nose, mouth, throat, and face: Denies mucositis or sore throat Respiratory: Denies cough, dyspnea or  wheezes Cardiovascular: Denies palpitation, chest discomfort or lower extremity swelling Gastrointestinal:  Denies nausea, heartburn  Skin: Denies abnormal skin rashes Lymphatics: Denies new lymphadenopathy or easy bruising  Neurological:Denies numbness, tingling or new weaknesses MSK: Muscles spasms on her right side of abd and Leg Behavioral/Psych: Mood is stable, no new changes  All other systems were reviewed with the patient and are negative.  PHYSICAL EXAMINATION:  ECOG PERFORMANCE STATUS: 1 BP 109/72 (BP Location: Right Arm, Patient Position: Sitting)   Pulse 86   Temp 98.1 F (36.7 C) (Oral)   Resp 17   Ht 5' 6"  (1.676 m)   Wt 140 lb (63.5 kg)   SpO2 100%   BMI 22.60 kg/m   GENERAL:alert, no distress and comfortable SKIN: skin color, texture, turgor are normal, no rashes or significant lesions EYES: normal, conjunctiva are pink and non-injected, sclera clear, eye lids appears normal OROPHARYNX:no exudate, no erythema and lips, buccal mucosa, and tongue normal  NECK: supple, thyroid normal size, non-tender, without nodularity LYMPH:  no palpable lymphadenopathy in the cervical, axillary or inguinal LUNGS: clear to auscultation and percussion with normal breathing effort HEART: regular rate & rhythm and no murmurs and no lower extremity edema ABDOMEN:abdomen soft, non-tender and normal bowel sounds Musculoskeletal:no cyanosis of digits and no clubbing  PSYCH: alert & oriented x 3 with fluent speech NEURO: no focal motor/sensory deficits Breasts: s/p bilateral mastectomy and tissue spender placement. Surgical incision sites are clean and well healed.     LABORATORY DATA:  I have reviewed the data as listed CBC Latest Ref Rng & Units 06/21/2017 04/19/2017 04/13/2017  WBC 3.9 - 10.3 10e3/uL 6.5 5.3 6.0  Hemoglobin 11.6 - 15.9 g/dL 12.8 11.8 12.6  Hematocrit 34.8 - 46.6 % 37.5 35.2 37.2  Platelets 145 - 400 10e3/uL 292 286 288    CMP Latest Ref Rng & Units 04/19/2017  04/13/2017 03/08/2017  Glucose 70 - 140 mg/dl 92 87 110  BUN 7.0 - 26.0 mg/dL 8.2 11.1 10.0  Creatinine 0.6 - 1.1 mg/dL 0.8 0.8 0.7  Sodium 136 - 145 mEq/L 141 142 139  Potassium 3.5 - 5.1 mEq/L 3.7 3.8 3.5  Chloride 101 - 111 mmol/L - - -  CO2 22 - 29 mEq/L 28 28 25   Calcium 8.4 - 10.4 mg/dL 10.0 10.1 9.7  Total Protein 6.4 - 8.3 g/dL 7.5 7.9 7.5  Total Bilirubin 0.20 - 1.20 mg/dL 1.01 0.72 0.80  Alkaline Phos 40 - 150 U/L 72 74 78  AST 5 - 34 U/L 15 16 15   ALT 0 - 55 U/L 13 14 12      Pathology report Diagnosis 11/25/2015 1. Breast, simple mastectomy, right - INVASIVE DUCTAL CARCINOMA, GRADE 2/3, SPANNING 2.0 CM. - DUCTAL CARCINOMA IN SITU, INTERMEDIATE GRADE. - LYMPHOVASCULAR INVASION IS IDENTIFIED. - LOBULAR NEOPLASIA (ATYPICAL LOBULAR HYPERPLASIA). - THE SURGICAL RESECTION MARGINS ARE NEGATIVE FOR CARCINOMA. - SEE ONCOLOGY TADifficulty concentrationBLE DifficultyBELOW. 2. Lymph node, seShe reports some memory lossntinel, biopsy, right axilloss,lary #1 - ME loss,TASTATIC CARCINOMA IN 1 OF 1 LYMPH NODE (1/1). 3. Lymph node, sentinel, biopsy, right axillary #2 - THERE IS NO EVIDENCE OF CARCINOMA IN 1 OF 1 LYMPH NODE (0/1). 4. Lymph node, sentinel, biopsy, right axillary #3 - THERE IS NO EVIDENCE OF CARCINOMA IN 1 OF 1 LYMPH NODE (0/1). 5. Breast, simple mastectomy, left - BENIGN BREAST PARENCHYMA WITH DENSE STROMAL FIBROSIS. - FIBROADENOMA. - THERE IS NO EVIDENCE OF MALIGNANCY. 6. Breast, excision, left, additional lateral margin - BENIGN FIBROADIPOSE TISSUE. - THERE IS NO EVIDENCE OF MALIGNANCY. - SEE COMMENT. Microscopic Comment 1. BREAST, INVASIVE TUMOR, WITH LYMPH NODES PRESENT Specimen, including laterality and lymph node sampling (sentinel, non-sentinel): Right  breast and right axillary lymph nodes Procedure: Bilateral mastectomy and multiple right axillary lymph node resections Histologic type: Ductal Grade: 2 Tubule formation: 2 1 of 4 FINAL for Milson, Neidy  ([id number]) Microscopic Comment(continued) Nuclear pleomorphism: 2 Mitotic: 2 Tumor size (gross measurement): 2.0 cm Margins: Negative for carcinoma Invasive, distance to closest margin: 1.8 cm to the posterior margin In-situ, distance to closest margin: 1.8 cm to the posterior margin Lymphovascular invasion: Present Ductal carcinoma in situ: Present Grade: Intermediate grade Extensive intraductal component: Not identified Lobular neoplasia: Present, atypical lobular hyperplasia Tumor focality: Unifocal Treatment effect: Not identified Extent of tumor: Confined to breast parenchyma Lymph nodes: Examined: 3 Sentinel 0 Non-sentinel 3 Total Lymph nodes with metastasis: 1 (macrometastasis) - no extracapsular extension is identified. Breast prognostic profile: [id number] Estrogen receptor: 100%, strong staining intensity Progesterone receptor: 100%, strong staining intensity Her 2 neu: Amplification was detected. The ratio was 2.58 Ki-67: 60% TNM: pT1c, pN1a Non-neoplastic breast: No significant findings. 6. The surgical resection margin(s) of the specimen were inked and microscopically evaluated. Enid Cutter MD Pathologist, Electronic Signature (Case signed 11/29/2015)   RADIOGRAPHIC STUDIES: I have personally reviewed the radiological images as listed and agreed with the findings in the report.  Echo 01/02/2017 Impressions: - Compared to a prior study in 07/2016, the LVEF is stable however,   LV strain is worse at -15% with inferolateral strain abnormality.  Bone scan 11/13/2016 IMPRESSION: IMPRESSION: 1. No evidence for metastatic disease. 2. Lumbar spine degenerative disc disease.   CT abdomen and pelvis w contrast 11/13/2016 IMPRESSION: 1.8 cm hypervascular lesion in segment 6, unchanged. Given stability, a benign etiology such as FNH or flash filling hemangioma is favored. Metastasis is difficult to exclude but is considered less likely.  1.9 cm lesion in  segment 3, compatible with a benign hemangioma.  No findings specific for metastatic disease in the abdomen. Please note that the pelvis was not imaged.   CT head w wo contrast 11/13/2016 IMPRESSION: Negative CT head with contrast.  ECHO 01/02/17  Impressions: - Compared to a prior study in 07/2016, the LVEF is stable however,   LV strain is worse at -15% with inferolateral strain abnormality.  ECHO 08/02/2016 Impressions: - Normal LV size with EF 55%. Strain as noted above. Normal RV size   and systolic function.  ASSESSMENT & PLAN:  51 y.o. African-American female, surgical postmenopausal, history of stage I left breast cancer, with newly diagnosed right breast cancer.  1. Cognitive dysfunction -she reports mild memory loss, difficulty with concentration and process some cognitive function -she appear to be very anxious,speech is normal to me, and her neuro exam was unremarkable -she has been under a lot of stress due to her busy jobs (she has two jobs) and poor sleep -I think it is likely related to her anxiety and stress, possible chemo brain. I certainly would like to ruled out brain metastasis, however due to her tissue expander, she is not able to undergo brain MRI. -We previously discussed a CT head with contrast in December 2017 for her headaches, which was negative. -I'll refer her to Education officer, museum for counseling, I strongly encouraged her cut her work hours, and reduce her stress level. -If her symptoms improve this, I do not think are repeat scan at this point.  2. Right breast invasive ductal carcinoma, pT1cN1aM0, stage IIB, grade 2, ER100%+/PR100%+/HER2+ -I previously reviewed her surgical pathology results with patient in great details. She has 1 out of 3 sentinel lymph nodes positive, her pathological stage  is more advanced than her initial clinical stage.  -We reviewed the natural history of triple positive breast cancer. HER-2 positive tumors are more progressive,  with higher risk of recurrence -I reviewed her repeated CT and bone scan images from 11/13/2016 with pt in detail, which are negative for metastatic disease. She has 2 small liver lesions, likely benign, unchanged. Unfortunately she is not able to do liver MRI, due to her breast implants.  -She has completed adjuvant chemotherapy TCHP, Herceptin and pejeta were held for the last 2 cycle due to her decreased EF, she has completed maintenance Herceptin and pejeta in March 2018.  -Her repeated echo has showed normal EF, she will continue Herceptin maintenance therapy. -She  has completed adjuvant breast and axilla radiation and tolerated well. -She previously could not tolerate letrozole and it was switched to exemestane, she tolerates it well lately, will continue for 5-7 years.  -She still had moderate hot flash, tolerable. She tried Effexor, did not help. -she tried neratinib, a recently FDA approved oral her2 antibody, but could not tolerate due to side effects. She has stopped it.  -She will follow-up with her cardiologist, possible have another repeat echo in a few months. -We reviewed to breast cancer surveillance. She is status post bilateral mastectomy, no need to routine mammogram. I previously encouraged her to do self exam. She'll follow-up with [place] every 3-6 months for labs and exam. -we previously  reviewed her labs and her Hbg is 11.8 but still in normal range. For her iron I suggest she takes a prenatal vitamin to help her levels. Labs adequate to continue exemestane  -repeat Echo in 1-2 months  -She has gotten her port removed - patient worried about her recent memory loss. I encouraged her it could be due to chemo brain  3. Genetics -Due to her young age and recurrent breast cancer, she was referred to see a genetic counselor in our cancer center. -Her genetic test was negative  4. DM -she will continue follow-up with her primary care physician -She is on metformin  5. Bone  health -She is postmenopausal by surgery - her bone density scan from December 2017 was normal, -  I previously reviewed the CT scan and bone density scan with her. I have reccommened her to see a  Orthopedic surgeon.  - I previously encouraged her to take vitamin D and calcium, along with drinking more water -She did get Vitamin D and calcium.   6. Insomnia and low appetite  - improved some, continue mirtazapine -previously has not been able to wean off ambien, will continue for now   7. Hot flush -Secondary to menopause and exemestane, improved, manageable- -she previously tried Effexor, didn't feel helpful  8. Mood swing   - I have previously advised the patient that her exemestane could be causing her anxious and agitated moods. Patient says she can handle it for now. If it gets out of hand, she will call me. -She previously tried Effexor, did not tolerate well.  - I have previously advised the patient to exercise, she plan to do yoga, which may help    Plan for today  - - order brain MRI for tomorrow or Saturday morning. But she was not able to do it due to her tissue expander - refer to social worker for anxiety counseling -She will continue exemestane - f/u in September as scheduled   All questions were answered. The patient knows to call the clinic with any problems, questions  or concerns.  I spent 25 minutes counseling the patient face to face. The total time spent in the appointment was 30 minutes and more than 50% was on counseling.  This document serves as a record of services personally performed by Truitt Merle, MD. It was created on her behalf by Brandt Loosen, a trained medical scribe. The creation of this record is based on the scribe's personal observations and the provider's statements to them. This document has been checked and approved by the attending provider.   Truitt Merle, MD 06/21/2017

## 2017-06-21 NOTE — Telephone Encounter (Signed)
Received walk-in form from [facility] 2 registration. Pt here with c/o "upset stomach, inabaility to focus, feeling slightly confused, not eating well, not sleeping well.  Spoke with pt in lobby area. She was very tearful, upset. States that there is something wrong with her. She was at work and on the phone but she couldn't understand what was being said to her. She said this happened over the weekend as well.  She states she doesn't know if she is having a panic attack or what. She is asking to see Dr. Burr Medico.  Informed Elray Buba, RN with Dr. Burr Medico.  Pt remains in lobby area near the elevators.

## 2017-06-22 ENCOUNTER — Encounter: Payer: Self-pay | Admitting: *Deleted

## 2017-06-22 NOTE — Progress Notes (Signed)
[place] Work  Clinical Social Work was referred by Futures trader for assessment of psychosocial needs due to emotional concerns, anxiety.  Clinical Social Worker contacted patient at home to offer support and assess for needs.  Pt reports she has been trying to do too much and not much self care. She reports she just felt things building up and "exploded". Pt has decided to cut back on her work hours in order to access support programs and groups. CSW also discussed common survivorship emotions and coping techniques. Pt not interested in one on one counseling currently, but will revisit options of support programs. CSW to email pt options of support programs and pt aware to reach out if she has questions, wants counseling or has any needs.   Clinical Social Work interventions: Supportive Psychiatric nurse education and referral  Loren Racer, LCSW, OSW-C Clinical Social Worker [place]  [place] Phone: [phone] Fax: [phone]

## 2017-06-24 ENCOUNTER — Encounter: Payer: Self-pay | Admitting: Hematology

## 2017-07-12 ENCOUNTER — Encounter: Payer: Self-pay | Admitting: General Practice

## 2017-07-12 ENCOUNTER — Telehealth: Payer: Self-pay | Admitting: *Deleted

## 2017-07-12 MED FILL — ZOLPIDEM TART ER 12.5 MG TA: 12.5 | 30 days supply | Qty: 30 | Fill #1

## 2017-07-12 MED FILL — EXEMESTANE 25 MG TABLET: 25 | 30 days supply | Qty: 30 | Fill #1

## 2017-07-12 NOTE — Progress Notes (Signed)
[place] Spiritual Care Note  Referred by Darcey Nora for emotional support related to anxiety, processing cancer ordeal, and adjustment to survivorship.  Sandra James welcomed conversation and found normalization of feelings very helpful, sharing and processing for almost 40 min.  Per pt, she is a single mom (kids now grown with their own children) and accustomed to powering through to manage any challenge, but that doesn't work in this situation.  Like many patients at this stage, she is realizing that she didn't process much of the emotional/identity layers during treatment; pt states that her anxiety has increased dramatically since removal of the port.  Additionally, her mom died this year, which has left her with incomplete grieving as well.   Submitted referral for [facility] per pt's request.  I will mail her a packet of [facility] team/programming information.  Encouraged participating in support group, FYNN (Finding Your New Normal survivorship program), and considering counseling as another layer of support and space just for her. She has cut back on work hours as Dr Burr Medico recommended and plans to explore evening support programming for support/normalization/community.  Referring her insomnia/ambien questions to Dr Burr Medico.  Plan to f/u with pt by phone next week, and she knows to call me anytime.   [place], [place], [facility] Pager [phone] Voicemail [phone]

## 2017-07-12 NOTE — Telephone Encounter (Signed)
Pt called reporting pain in hands and decreased appetite.  Spoke with pt, and was informed that pt has pain " All over, and it's going on for about a week .  I am falling apart.  I don't know what to do ".   Stated she left work and was driving home.  Instructed pt to go home and stay as calm as she can.  Nurse will call pt back.  Pt voiced understanding. Spoke with Lorrin Jackson, chaplain, and informed her of pt's situation.  Lattie Haw to reach out to pt for assistance. Loren Racer, SW notified of pt's situation today.   Abby Potash to reach out to pt on 07/13/17. Dr. Burr Medico notified.   MD to call pt with further instructions. Pt's    Phone      [phone].

## 2017-07-12 NOTE — Telephone Encounter (Signed)
Received vm message from patient to call her.  TCT patient and spoke with her. She states she spoke with chaplain this morning to help with anxiety issues. She states that helped but she is asking if Dr. Burr Medico can prescribe something for her anxiety 'to help take the edge off'. Pt uses Personnel officer.

## 2017-07-13 ENCOUNTER — Telehealth: Payer: Self-pay

## 2017-07-13 ENCOUNTER — Ambulatory Visit (HOSPITAL_BASED_OUTPATIENT_CLINIC_OR_DEPARTMENT_OTHER): Payer: BLUE CROSS/BLUE SHIELD | Admitting: Hematology

## 2017-07-13 DIAGNOSIS — F32A Depression, unspecified: Secondary | ICD-10-CM

## 2017-07-13 DIAGNOSIS — C50411 Malignant neoplasm of upper-outer quadrant of right female breast: Secondary | ICD-10-CM | POA: Diagnosis not present

## 2017-07-13 DIAGNOSIS — E119 Type 2 diabetes mellitus without complications: Secondary | ICD-10-CM

## 2017-07-13 DIAGNOSIS — F329 Major depressive disorder, single episode, unspecified: Secondary | ICD-10-CM | POA: Diagnosis not present

## 2017-07-13 DIAGNOSIS — Z170 Estrogen receptor positive status [ER+]: Secondary | ICD-10-CM

## 2017-07-13 DIAGNOSIS — F419 Anxiety disorder, unspecified: Secondary | ICD-10-CM | POA: Diagnosis not present

## 2017-07-13 MED ORDER — VENLAFAXINE HCL ER 75 MG PO CP24: 75.0000 mg | ORAL_CAPSULE | Freq: Every day | ORAL | 1 refills | Status: DC

## 2017-07-13 MED ORDER — ALPRAZOLAM 0.25 MG PO TABS: 0.2500 mg | ORAL_TABLET | Freq: Every evening | ORAL | 0 refills | Status: DC | PRN

## 2017-07-13 MED FILL — VENLAFAXINE HCL ER 75 MG CA: 75 | 30 days supply | Qty: 30 | Fill #0

## 2017-07-13 MED FILL — ALPRAZolam 0.25 MG TABS: 0.25 | 20 days supply | Qty: 20 | Fill #0

## 2017-07-13 NOTE — Progress Notes (Signed)
[place]  [phone] [phone]  Clinic follow Up Note   Patient Care Team: Kristie Cowman, MD as PCP - General (Family Medicine) Stark Klein, MD as Consulting Physician (General Surgery) Truitt Merle, MD as Consulting Physician (Hematology) Mauro Kaufmann, RN as Registered Nurse Rockwell Germany, RN as Registered Nurse Causey, Charlestine Massed, NP as Nurse Practitioner (Hematology and Oncology) Kyung Rudd, MD as Consulting Physician (Radiation Oncology) 07/13/2017  CHIEF COMPLAINTS:  anxiety  Oncology History   Cancer Staging Breast cancer of upper-outer quadrant of right female breast [facility]) Staging form: Breast, AJCC 7th Edition - Clinical stage from 09/15/2015: Stage IA (T1b, N0, M0) - Signed by Truitt Merle, MD on 12/16/2015 - Pathologic stage from 11/25/2015: Stage IIA (T1c, N1a, cM0) - Signed by Truitt Merle, MD on 12/16/2015       Breast cancer of upper-outer quadrant of right female breast ([place])   09/07/2015 Mammogram    Diagnostic mammogram and the ultrasound of the right breast showed a 0.8 cm lobulated mass in the right breast 11 to 12:00 position 9 cm from the nipple. No other lesions or adenopathy.      09/08/2015 Initial Diagnosis    Breast cancer of upper-outer quadrant of right female breast ([place])      09/08/2015 Initial Biopsy    Right breast mass at the upper outer quadrant core needle biopsy showed invasive ductal carcinoma, grade 2-3, ductal carcinoma in situ.      09/08/2015 Receptors her2    ER 100% positive, PR 100% positive, Ki-67 60%, HER-2 positive with copy # 4.25 and ratio 2.58      09/28/2015 Genetic Testing    Genetic testing was normal, and did not reveal a deleterious mutation in these genes.  Additionally, no variants of uncertain significance (VUSes) were found.  Genes tested include: ATM, BARD1, BRCA1, BRCA2, BRIP1, CDH1, CHEK2,  FANCC, MLH1, MSH2, MSH6, NBN, PALB2, PMS2, PTEN, RAD51C, RAD51D, TP53, and XRCC2.   This panel also includes deletion/duplication analysis (without sequencing) for one gene, EPCAM.      11/25/2015 Surgery    Right breast mastectomy and sentinel lymph node biopsy      11/25/2015 Pathology Results    Right breast invasive ductal carcinoma, grade 2, 2.0 cm, DCIS, intermediate grade, (+) LVI, margins were negative, 1 out of 3 lymph nodes were negative. Left breast ostectomy was negative for malignancy.      01/06/2016 - 04/20/2016 Chemotherapy    adjuvant chemotherapy TCH P, every 3 weeks, for a total of 6 cycles. Herceptin and Perjeta were held for last two cycles due to drop of her EF on echo       05/25/2016 - 07/11/2016 Radiation Therapy    Adjuvant breast radiation [facility]): 1. The Right chest wall (4-field) was treated to 50.4 Gy in 28 fractions at 1.8 Gy per fraction. 2. The Right chest wall was boosted to 10 Gy in 5 fractions at 2 Gy per fraction.  3. The Right supraclavicular region was treated to 50.4 Gy in 28 fractions at 1.8 Gy per fraction.      06/08/2016 - 03/08/2017 Chemotherapy    maintenance Herceptin every 3 weeks, after clearance from cardiology.      08/11/2016 -  Anti-estrogen oral therapy    Exemestane 25 mg daily      11/13/2016 Imaging    Bone Scan IMPRESSION: 1. No evidence for metastatic disease. 2. Lumbar spine degenerative disc disease.      11/13/2016 Imaging  CT Abdomen W Contrast IMPRESSION: 1.8 cm hypervascular lesion in segment 6, unchanged. Given stability, a benign etiology such as FNH or flash filling hemangioma is favored. Metastasis is difficult to exclude but is considered less likely. 1.9 cm lesion in segment 3, compatible with a benign hemangioma. No findings specific for metastatic disease in the abdomen. Please note that the pelvis was not imaged.      11/13/2016 Imaging    CT Head W Contrast IMPRESSION: Negative CT head with contrast.      01/02/2017 Imaging    Echo 01/02/2017 Impressions: - Compared to a prior study  in 07/2016, the LVEF is stable however,   LV strain is worse at -15% with inferolateral strain abnormality.       HISTORY OF PRESENTING ILLNESS:  Sandra James 51 y.o. female with past medical history of stage I left breast cancer, is here because of newly diagnosed right breast cancer. She presents to our multidisciplinary breast clinic by herself.  The right breast cancer was discovered by screening mammogram. She did not have any palpable mass, or any constitutional symptoms. She was diagnosed with stage I left breast cancer at age of 20, she had lumpectomy, radiation, and adjuvant chemotherapy and 5 years of tamoxifen. She was treated by Dr. Louann Sjogren in [place]. She moved to [facility] about year ago due to job change. She has been very compliant with annual screening mammogram.  She had hysterectomy and bilateral oophorectomy and sphincterectomy 5 years ago for heavy bleeding.. She has been having hot flashes since then, moderate, but manageable. She is single, lives alone, works for 2 to [place] has to go up children who live in [place].  CURRENT THERAPY: Exemestane 25 mg daily, started on 08/11/2016  INTERIM HISTORY Myrl came into clinic requesting a walk in appointment to discuss her anxiety issue. She has been very anxious lately. Yesterday she had some joint pain which caused her anxiety to race. She thinks when she got her port removed it "triggered" something. She reports some pain around her sciatic nerve which she takes Aleeve or Advil for. She has not had a high appetite lately and has also been isolating herself.   MED ICAL HISTORY:  Past Medical History:  Diagnosis Date  . Allergy   . Anxiety   . Breast cancer ([place]) 09/07/15   right breast  . Breast cancer of upper-outer quadrant of right female breast ([place]) 09/09/2015  . Cancer [facility])    left brast lumpectomy   . Complication of anesthesia    slow to wake up- BP was low, fainted next day-  . Depression     . Fibroid   . GERD (gastroesophageal reflux disease)   . Infection    UTI  . MVP (mitral valve prolapse)   . Neuromuscular disorder ([place])    sciatic nerve paion left side/leg  . UTI (lower urinary tract infection)     SURGICAL HISTORY: Past Surgical History:  Procedure Laterality Date  . ABDOMINAL HYSTERECTOMY    . BREAST RECONSTRUCTION WITH PLACEMENT OF TISSUE EXPANDER AND FLEX HD (ACELLULAR HYDRATED DERMIS) Bilateral 11/25/2015   Procedure: BILATERAL IMMEDIATE BREAST RECONSTRUCTION WITH PLACEMENT OF TISSUE EXPANDERS AND FLEX HD (ACELLULAR HYDRATED DERMIS);  Surgeon: Wallace Going, DO;  Location: [facility];  Service: Plastics;  Laterality: Bilateral;  . BREAST SURGERY  2004   left lumpectomy  . MASTECTOMY W/ SENTINEL NODE BIOPSY Bilateral 11/25/2015   Procedure: BILATERAL SKIN SPARING MASTECTOMIES WITH RIGHT  SENTINEL LYMPH NODE BIOPSY;  Surgeon: Stark Klein, MD;  Location: [facility];  Service: General;  Laterality: Bilateral;  . OOPHORECTOMY Bilateral   . PORT-A-CATH REMOVAL Left 05/29/2017   Procedure: PORT REMOVAL;  Surgeon: Stark Klein, MD;  Location: [facility];  Service: General;  Laterality: Left;  . PORTACATH PLACEMENT Left 12/20/2015   Procedure: INSERTION PORT-A-CATH, left chest;  Surgeon: Stark Klein, MD;  Location: [facility];  Service: General;  Laterality: Left;    SOCIAL HISTORY: Social History   Social History  . Marital Status: Single     Spouse Name: N/A  . Number of Children: 2 children, 76 daughter and 56 yo son    . Years of Education: N/A   Occupational History  . She is a Barista rep   Social History Main Topics  . Smoking status: Never Smoker   . Smokeless tobacco: Never Used  . Alcohol Use: Yes     Comment: 2-3/wk  . Drug Use: No  . Sexual Activity: Yes    Birth Control/ Protection: Surgical   Other Topics Concern  . Not on file   Social History Narrative    FAMILY HISTORY: Family  History  Problem Relation Age of Onset  . Hypertension Mother   . Stroke Mother   . Alzheimer's disease Mother   . Heart disease Father   . Stroke Father   . Heart attack Father   . Breast cancer Sister 47       double mastectomy  . Other Sister        "stomach tumor that wrapped around reproductive organs"; dx. 60s; required partial hysterectomy  . Alzheimer's disease Maternal Grandmother   . Alzheimer's disease Paternal Grandmother   . Other Other        had to have lymph nodes removed and radiation; dx. 72s  . Cancer Maternal Aunt        unspecified type; dx. <50  . Colon cancer Maternal Uncle        dx. 42s  . Breast cancer Cousin        dx. 58s    ALLERGIES:  is allergic to lisinopril; oxycodone; and percocet [oxycodone-acetaminophen].  MEDICATIONS:  Current Outpatient Prescriptions  Medication Sig Dispense Refill  . ALPRAZolam (XANAX) 0.25 MG tablet Take 1 tablet (0.25 mg total) by mouth at bedtime as needed for anxiety. 20 tablet 0  . Calcium Carbonate-Vitamin D (CALTRATE 600+D) 600-400 MG-UNIT tablet Take 1 tablet by mouth daily.    Marland Kitchen exemestane (AROMASIN) 25 MG tablet TAKE 1 TABLET BY MOUTH DAILY AFTER BREAKFAST. 30 tablet 2  . ibuprofen (ADVIL,MOTRIN) 800 MG tablet Take 800 mg by mouth daily as needed for headache or moderate pain.    Marland Kitchen PRENATAL VIT-FE-FA-CA-DSS-DHA PO Take by mouth.    . venlafaxine XR (EFFEXOR-XR) 75 MG 24 hr capsule Take 1 capsule (75 mg total) by mouth daily with breakfast. 30 capsule 1  . zolpidem (AMBIEN CR) 12.5 MG CR tablet Take 1 tablet (12.5 mg total) by mouth at bedtime as needed for sleep. 30 tablet 2   No current facility-administered medications for this visit.     REVIEW OF SYSTEMS:   Constitutional: Denies fevers, chills or abnormal night sweats (+)tolerable hot flashes Eyes: Denies blurriness of vision, double vision or watery eyes Ears, nose, mouth, throat, and face: Denies mucositis or sore throat Respiratory: Denies cough,  dyspnea or wheezes Cardiovascular: Denies palpitation, chest discomfort or lower extremity swelling Gastrointestinal:  Denies nausea, heartburn  Skin: Denies abnormal skin rashes Lymphatics: Denies new lymphadenopathy or easy bruising Neurological:Denies numbness, tingling or new weaknesses MSK: Muscles spasms on her right side of abd and Leg Behavioral/Psych: (+)anxiety and mood swings All other systems were reviewed with the patient and are negative.  PHYSICAL EXAMINATION:  ECOG PERFORMANCE STATUS: 1 There were no vitals taken for this visit.  GENERAL:alert, no distress and comfortable SKIN: skin color, texture, turgor are normal, no rashes or significant lesions EYES: normal, conjunctiva are pink and non-injected, sclera clear, eye lids appears normal OROPHARYNX:no exudate, no erythema and lips, buccal mucosa, and tongue normal  NECK: supple, thyroid normal size, non-tender, without nodularity LYMPH:  no palpable lymphadenopathy in the cervical, axillary or inguinal LUNGS: clear to auscultation and percussion with normal breathing effort HEART: regular rate & rhythm and no murmurs and no lower extremity edema ABDOMEN:abdomen soft, non-tender and normal bowel sounds Musculoskeletal:no cyanosis of digits and no clubbing  PSYCH: alert & oriented x 3 with fluent speech NEURO: no focal motor/sensory deficits Breasts: s/p bilateral mastectomy and tissue spender placement. Surgical incision sites are clean and well healed.     LABORATORY DATA:  I have reviewed the data as listed CBC Latest Ref Rng & Units 06/21/2017 04/19/2017 04/13/2017  WBC 3.9 - 10.3 10e3/uL 6.5 5.3 6.0  Hemoglobin 11.6 - 15.9 g/dL 12.8 11.8 12.6  Hematocrit 34.8 - 46.6 % 37.5 35.2 37.2  Platelets 145 - 400 10e3/uL 292 286 288    CMP Latest Ref Rng & Units 06/21/2017 04/19/2017 04/13/2017  Glucose 70 - 140 mg/dl 99 92 87  BUN 7.0 - 26.0 mg/dL 11.3 8.2 11.1  Creatinine 0.6 - 1.1 mg/dL 0.8 0.8 0.8  Sodium 136 - 145  mEq/L 140 141 142  Potassium 3.5 - 5.1 mEq/L 3.3(L) 3.7 3.8  Chloride 101 - 111 mmol/L - - -  CO2 22 - 29 mEq/L 24 28 28   Calcium 8.4 - 10.4 mg/dL 10.0 10.0 10.1  Total Protein 6.4 - 8.3 g/dL 8.0 7.5 7.9  Total Bilirubin 0.20 - 1.20 mg/dL 1.06 1.01 0.72  Alkaline Phos 40 - 150 U/L 68 72 74  AST 5 - 34 U/L 15 15 16   ALT 0 - 55 U/L 11 13 14      Pathology report Diagnosis 11/25/2015 1. Breast, simple mastectomy, right - INVASIVE DUCTAL CARCINOMA, GRADE 2/3, SPANNING 2.0 CM. - DUCTAL CARCINOMA IN SITU, INTERMEDIATE GRADE. - LYMPHOVASCULAR INVASION IS IDENTIFIED. - LOBULAR NEOPLASIA (ATYPICAL LOBULAR HYPERPLASIA). - THE SURGICAL RESECTION MARGINS ARE NEGATIVE FOR CARCINOMA. - SEE ONCOLOGY TADifficulty concentrationBLE DifficultyBELOW. 2. Lymph node, seShe reports some memory lossntinel, biopsy, right axilloss,lary #1 - ME loss,TASTATIC CARCINOMA IN 1 OF 1 LYMPH NODE (1/1). 3. Lymph node, sentinel, biopsy, right axillary #2 - THERE IS NO EVIDENCE OF CARCINOMA IN 1 OF 1 LYMPH NODE (0/1). 4. Lymph node, sentinel, biopsy, right axillary #3 - THERE IS NO EVIDENCE OF CARCINOMA IN 1 OF 1 LYMPH NODE (0/1). 5. Breast, simple mastectomy, left - BENIGN BREAST PARENCHYMA WITH DENSE STROMAL FIBROSIS. - FIBROADENOMA. - THERE IS NO EVIDENCE OF MALIGNANCY. 6. Breast, excision, left, additional lateral margin - BENIGN FIBROADIPOSE TISSUE. - THERE IS NO EVIDENCE OF MALIGNANCY. - SEE COMMENT. Microscopic Comment 1. BREAST, INVASIVE TUMOR, WITH LYMPH NODES PRESENT Specimen, including laterality and lymph node sampling (sentinel, non-sentinel): Right breast and right axillary lymph nodes Procedure: Bilateral mastectomy and multiple right axillary lymph node resections Histologic type: Ductal Grade: 2 Tubule formation: 2 1 of 4 FINAL for Budzik, Bartholomew ([id number])  Microscopic Comment(continued) Nuclear pleomorphism: 2 Mitotic: 2 Tumor size (gross measurement): 2.0 cm Margins: Negative for  carcinoma Invasive, distance to closest margin: 1.8 cm to the posterior margin In-situ, distance to closest margin: 1.8 cm to the posterior margin Lymphovascular invasion: Present Ductal carcinoma in situ: Present Grade: Intermediate grade Extensive intraductal component: Not identified Lobular neoplasia: Present, atypical lobular hyperplasia Tumor focality: Unifocal Treatment effect: Not identified Extent of tumor: Confined to breast parenchyma Lymph nodes: Examined: 3 Sentinel 0 Non-sentinel 3 Total Lymph nodes with metastasis: 1 (macrometastasis) - no extracapsular extension is identified. Breast prognostic profile: [phone] Estrogen receptor: 100%, strong staining intensity Progesterone receptor: 100%, strong staining intensity Her 2 neu: Amplification was detected. The ratio was 2.58 Ki-67: 60% TNM: pT1c, pN1a Non-neoplastic breast: No significant findings. 6. The surgical resection margin(s) of the specimen were inked and microscopically evaluated. Enid Cutter MD Pathologist, Electronic Signature (Case signed 11/29/2015)   RADIOGRAPHIC STUDIES: I have personally reviewed the radiological images as listed and agreed with the findings in the report.  Echo 01/02/2017 Impressions: - Compared to a prior study in 07/2016, the LVEF is stable however,   LV strain is worse at -15% with inferolateral strain abnormality.  Bone scan 11/13/2016 IMPRESSION: IMPRESSION: 1. No evidence for metastatic disease. 2. Lumbar spine degenerative disc disease.   CT abdomen and pelvis w contrast 11/13/2016 IMPRESSION: 1.8 cm hypervascular lesion in segment 6, unchanged. Given stability, a benign etiology such as FNH or flash filling hemangioma is favored. Metastasis is difficult to exclude but is considered less likely.  1.9 cm lesion in segment 3, compatible with a benign hemangioma.  No findings specific for metastatic disease in the abdomen. Please note that the pelvis was  not imaged.   CT head w wo contrast 11/13/2016 IMPRESSION: Negative CT head with contrast.  ECHO 01/02/17  Impressions: - Compared to a prior study in 07/2016, the LVEF is stable however,   LV strain is worse at -15% with inferolateral strain abnormality.  ECHO 08/02/2016 Impressions: - Normal LV size with EF 55%. Strain as noted above. Normal RV size   and systolic function.  ASSESSMENT & PLAN:  51 y.o. African-American female, surgical postmenopausal, history of stage I left breast cancer, with newly diagnosed right breast cancer.  1. Anxiety and depression  -She reports anxiety attack, an overall depressed mood since she completed her major breast cancer treatment. -She also has some cognitive dysfunction when she is very anxious, she was seen for this a few weeks ago. -She did not feel better after counseling with our social worker and chaplain, but needs some medication for her anxiety attack. -I recommend her to consider SSRI for her depression and anxiety. She tried Effexor before, agree to restart at the 75 mg daily -I also given her a small prescription of Xanax, to use as needed for panic attack. 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 She agrees.  -I also encouraged her to exercise routinely, such as yoga, and meditation.  2. Right breast invasive ductal carcinoma, pT1cN1aM0, stage IIB, grade 2, ER100%+/PR100%+/HER2+ -I previously reviewed her surgical pathology results with patient in great details. She has 1 out of 3 sentinel lymph nodes positive, her pathological stage is more advanced than her initial clinical stage.  -We reviewed the natural history of triple positive breast cancer. HER-2 positive tumors are more progressive, with higher risk of recurrence -I reviewed her repeated CT and bone scan images from 11/13/2016 with pt in  detail, which are negative for metastatic disease. She has 2 small liver lesions, likely benign,  unchanged. Unfortunately she is not able to do liver MRI, due to her breast implants.  -She has completed adjuvant chemotherapy TCHP, Herceptin and pejeta were held for the last 2 cycle due to her decreased EF, she has completed maintenance Herceptin and pejeta in March 2018.  -Her repeated echo has showed normal EF, she will continue Herceptin maintenance therapy. -She  has completed adjuvant breast and axilla radiation and tolerated well. -She previously could not tolerate letrozole and it was switched to exemestane, she tolerates it well lately, will continue for 5-7 years.  -She still had moderate hot flash, tolerable. She tried Effexor, did not help. -she tried neratinib, a recently FDA approved oral her2 antibody, but could not tolerate due to side effects. She has stopped it.  -She will follow-up with her cardiologist, possible have another repeat echo in a few months. -We reviewed to breast cancer surveillance. She is status post bilateral mastectomy, no need to routine mammogram. I previously encouraged her to do self exam. She'll follow-up with [place] every 3-6 months for labs and exam. -we previously  reviewed her labs and her Hbg is 11.8 but still in normal range. For her iron I suggest she takes a prenatal vitamin to help her levels. Labs adequate to continue exemestane  -repeat Echo in 1-2 months  -She has gotten her port removed  3. Genetics -Due to her young age and recurrent breast cancer, she was referred to see a genetic counselor in our cancer center. -Her genetic test was negative  4. DM -she will continue follow-up with her primary care physician -She is on metformin  5. Bone health -She is postmenopausal by surgery - her bone density scan from December 2017 was normal, -  I previously reviewed the CT scan and bone density scan with her. I have reccommened her to see a  Orthopedic surgeon.  - I previously encouraged her to take vitamin D and calcium, along with drinking  more water -She did get Vitamin D and calcium.   6. Insomnia and low appetite  - improved some, continue mirtazapine -previously has not been able to wean off ambien, will continue for now   7. Hot flush -Secondary to menopause and exemestane, improved, manageable- -she previously tried Effexor, didn't feel helpful. We restart today at 56m for her mood swings and anxiety  8. Mood swing   - I have previously advised the patient that her exemestane could be causing her anxious and agitated moods. Patient says she can handle it for now. If it gets out of hand, she will call me. -She previously tried Effexor, did not tolerate well.  - I have previously advised the patient to exercise, she plans to try yoga or meditation - I will prescribe [id number] Effexor today and Xanex to take as needed   Plan for today  - I will prescribe 7524mffexor today and 0.59m53mnex to take as needed, up to twice daily - f/u in September as it's scheduled   All questions were answered. The patient knows to call the clinic with any problems, questions or concerns.  I spent 15 minutes counseling the patient face to face. The total time spent in the appointment was 20 minutes and more than 50% was on counseling.  This document serves as a record of services personally performed by An Lannan Truitt Merle. It was created on her behalf by TalaBrandt Loosen  trained medical scribe. The creation of this record is based on the scribe's personal observations and the provider's statements to them. This document has been checked and approved by the attending provider.

## 2017-07-13 NOTE — Telephone Encounter (Signed)
Pt walked in asking for anxiety medication. She had lvm yesterday.  S/w Dr Burr Medico and she will see pt today as work in. Pt agreeable. In basket sent high priority and call to [facility].

## 2017-07-14 ENCOUNTER — Encounter: Payer: Self-pay | Admitting: Hematology

## 2017-07-14 DIAGNOSIS — F329 Major depressive disorder, single episode, unspecified: Secondary | ICD-10-CM | POA: Insufficient documentation

## 2017-07-14 DIAGNOSIS — F419 Anxiety disorder, unspecified: Secondary | ICD-10-CM

## 2017-07-16 ENCOUNTER — Telehealth: Payer: Self-pay

## 2017-07-16 NOTE — Telephone Encounter (Signed)
Pt just started taking effexor on Thursday. Friday stayed home from work and felt a little dizzy. Saturday went to work but felt dizzy, headache, no energy.  Been in bed since Saturday night. Effexor was given for anxiety and help with s/e of exemestane.   Instructed pt to hold effexor and Dr Burr Medico will be advised.

## 2017-07-19 ENCOUNTER — Encounter: Payer: Self-pay | Admitting: General Practice

## 2017-07-19 NOTE — Progress Notes (Signed)
[place] Spiritual Care Note  Reached Sandra James by phone to f/u for further emotional support.  Per pt, she took today off because her heart was racing so much at work yesterday; taking time for herself has helped her relax physically and emotionally, and she states the "fog is lifting."  Per pt, her sadness and appetite have both improved.  She is working to manage her distress, anxiety, and social isolation.  To that end, she requests f/u call next week.  Following for support.   [place], [place], [facility] Pager [phone] Voicemail [phone]

## 2017-07-27 ENCOUNTER — Encounter: Payer: Self-pay | Admitting: General Practice

## 2017-07-27 NOTE — Progress Notes (Signed)
[place] Spiritual Care Note  LVM for f/u support per pt request, encouraging her to return call.   [place], [place], [facility] Pager [phone] Voicemail [phone]

## 2017-08-03 ENCOUNTER — Other Ambulatory Visit: Payer: Self-pay | Admitting: Hematology

## 2017-08-03 ENCOUNTER — Encounter: Payer: Self-pay | Admitting: General Practice

## 2017-08-03 ENCOUNTER — Telehealth: Payer: Self-pay | Admitting: *Deleted

## 2017-08-03 DIAGNOSIS — C50411 Malignant neoplasm of upper-outer quadrant of right female breast: Secondary | ICD-10-CM

## 2017-08-03 DIAGNOSIS — Z170 Estrogen receptor positive status [ER+]: Principal | ICD-10-CM

## 2017-08-03 MED ORDER — CITALOPRAM HYDROBROMIDE 10 MG PO TABS: 10.0000 mg | ORAL_TABLET | Freq: Every day | ORAL | 0 refills | Status: DC

## 2017-08-03 MED FILL — CITALOPRAM HBR 10 MG TABLET: 10 | 20 days supply | Qty: 20 | Fill #0

## 2017-08-03 NOTE — Telephone Encounter (Signed)
Received call from pt stating that she is physically fatigued & achey.  She reports that her hands hurt more & is waking her up at hs.  She also states that her appetite is horrible & is forcing herself to eat.  She took a 2 wk break on her exemestane & that didn't make any difference so she went back on it. She took 3 days of effexor & stopped b/c she didn't like how it made her feel.  Discussed with Dr Burr Medico. She thinks pt is depressed & suggested pt seeing psychiatrist & is willing to make referral to Behavioral health.  She could also use another antidepressant.  If she wants to be seen sooner, we can move appt.  Discussed again with Dr Burr Medico & she will send in script for celexa & will keep 08/20/17 appt to see how she is doing on the med.  Referral also made to behavioral health by Dr Burr Medico.  [place] was given this info & she wants to do FMLA since she feels that this is effecting her job performance & to give time for celexa to work.  She will have FMLA papers faxed to [place].

## 2017-08-03 NOTE — Progress Notes (Signed)
[facility] Spiritual Care Note  Missed visit and call from [place] while off campus; followed up by phone today.  Conversation was limited because pt was at work. Sandra James describes her distress as unmanageably high and is very eager for Dr Ernestina Penna help in referring to a counselor and prescribing something to help with depression/anxiety.  Affirmed and encouraged her reaching out for support.  We plan to f/u in more detail next week, but please also page if immediate needs arise.  Thank you.   [place], [place], [facility] Pager [phone] Voicemail [phone]

## 2017-08-06 ENCOUNTER — Encounter: Payer: Self-pay | Admitting: General Practice

## 2017-08-06 ENCOUNTER — Telehealth: Payer: Self-pay

## 2017-08-06 NOTE — Progress Notes (Signed)
[place] Spiritual Care Note  Missed Sandra James when she came to [facility] today, so I followed up by phone.  Per pt, so far the addition of Celexa seems helpful--"I have fewer racing thoughts, and they don't last as long."  She also plants to utilize [employer] during this time in order to build in additional self-care and meaning-making to help her anxiety and distress level.  We scheduled an appt for Friday 8/31 at noon to give her space to process how her first of two weeks away from work is going.     [place], [place], [facility] Pager [phone] Voicemail [phone]

## 2017-08-06 NOTE — Telephone Encounter (Signed)
Dr took her out for 2 weeks and she needs fmla papers signed. Gave her fax number for Dr Ernestina Penna pod.

## 2017-08-10 ENCOUNTER — Encounter: Payer: Self-pay | Admitting: General Practice

## 2017-08-10 MED FILL — ZOLPIDEM TART ER 12.5 MG TA: 12.5 | 30 days supply | Qty: 30 | Fill #2

## 2017-08-10 NOTE — Progress Notes (Signed)
[place] Spiritual Care Note  Met with Sandra James in person for the first time (1h appt).  She is doing a lot of processing and reflection now that she has finished treatment.  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  She knows to contact Support Team anytime for further conversation/encouragement.   [place], [place], [facility] Pager [phone] Voicemail [phone]

## 2017-08-16 NOTE — Progress Notes (Signed)
[place]  [phone] [phone]  Clinic follow Up Note   Patient Care Team: Kristie Cowman, MD as PCP - General (Family Medicine) Stark Klein, MD as Consulting Physician (General Surgery) Truitt Merle, MD as Consulting Physician (Hematology) Mauro Kaufmann, RN as Registered Nurse Rockwell Germany, RN as Registered Nurse Causey, Charlestine Massed, NP as Nurse Practitioner (Hematology and Oncology) Kyung Rudd, MD as Consulting Physician (Radiation Oncology) 08/20/2017  CHIEF COMPLAINTS:  Anxiety and follow up of Right Breast cancer  Oncology History   Cancer Staging Breast cancer of upper-outer quadrant of right female breast [facility]) Staging form: Breast, AJCC 7th Edition - Clinical stage from 09/15/2015: Stage IA (T1b, N0, M0) - Signed by Truitt Merle, MD on 12/16/2015 - Pathologic stage from 11/25/2015: Stage IIA (T1c, N1a, cM0) - Signed by Truitt Merle, MD on 12/16/2015       Breast cancer of upper-outer quadrant of right female breast ([place])   09/07/2015 Mammogram    Diagnostic mammogram and the ultrasound of the right breast showed a 0.8 cm lobulated mass in the right breast 11 to 12:00 position 9 cm from the nipple. No other lesions or adenopathy.      09/08/2015 Initial Diagnosis    Breast cancer of upper-outer quadrant of right female breast ([place])      09/08/2015 Initial Biopsy    Right breast mass at the upper outer quadrant core needle biopsy showed invasive ductal carcinoma, grade 2-3, ductal carcinoma in situ.      09/08/2015 Receptors her2    ER 100% positive, PR 100% positive, Ki-67 60%, HER-2 positive with copy # 4.25 and ratio 2.58      09/28/2015 Genetic Testing    Genetic testing was normal, and did not reveal a deleterious mutation in these genes.  Additionally, no variants of uncertain significance (VUSes) were found.  Genes tested include: ATM, BARD1, BRCA1, BRCA2, BRIP1, CDH1, CHEK2,  FANCC, MLH1, MSH2, MSH6, NBN, PALB2, PMS2,  PTEN, RAD51C, RAD51D, TP53, and XRCC2.  This panel also includes deletion/duplication analysis (without sequencing) for one gene, EPCAM.      11/25/2015 Surgery    Right breast mastectomy and sentinel lymph node biopsy      11/25/2015 Pathology Results    Right breast invasive ductal carcinoma, grade 2, 2.0 cm, DCIS, intermediate grade, (+) LVI, margins were negative, 1 out of 3 lymph nodes were negative. Left breast ostectomy was negative for malignancy.      01/06/2016 - 04/20/2016 Chemotherapy    adjuvant chemotherapy TCH P, every 3 weeks, for a total of 6 cycles. Herceptin and Perjeta were held for last two cycles due to drop of her EF on echo       05/25/2016 - 07/11/2016 Radiation Therapy    Adjuvant breast radiation [facility]): 1. The Right chest wall (4-field) was treated to 50.4 Gy in 28 fractions at 1.8 Gy per fraction. 2. The Right chest wall was boosted to 10 Gy in 5 fractions at 2 Gy per fraction.  3. The Right supraclavicular region was treated to 50.4 Gy in 28 fractions at 1.8 Gy per fraction.      06/08/2016 - 03/08/2017 Chemotherapy    maintenance Herceptin every 3 weeks, after clearance from cardiology.      08/11/2016 -  Anti-estrogen oral therapy    Exemestane 25 mg daily      11/13/2016 Imaging    Bone Scan IMPRESSION: 1. No evidence for metastatic disease. 2. Lumbar spine degenerative disc disease.  11/13/2016 Imaging    CT Abdomen W Contrast IMPRESSION: 1.8 cm hypervascular lesion in segment 6, unchanged. Given stability, a benign etiology such as FNH or flash filling hemangioma is favored. Metastasis is difficult to exclude but is considered less likely. 1.9 cm lesion in segment 3, compatible with a benign hemangioma. No findings specific for metastatic disease in the abdomen. Please note that the pelvis was not imaged.      11/13/2016 Imaging    CT Head W Contrast IMPRESSION: Negative CT head with contrast.      01/02/2017 Imaging    Echo  01/02/2017 Impressions: - Compared to a prior study in 07/2016, the LVEF is stable however,   LV strain is worse at -15% with inferolateral strain abnormality.       HISTORY OF PRESENTING ILLNESS:  Sandra James 51 y.o. female with past medical history of stage I left breast cancer, is here because of newly diagnosed right breast cancer. She presents to our multidisciplinary breast clinic by herself.  The right breast cancer was discovered by screening mammogram. She did not have any palpable mass, or any constitutional symptoms. She was diagnosed with stage I left breast cancer at age of 20, she had lumpectomy, radiation, and adjuvant chemotherapy and 5 years of tamoxifen. She was treated by Dr. Louann Sjogren in [place]. She moved to [facility] about year ago due to job change. She has been very compliant with annual screening mammogram.  She had hysterectomy and bilateral oophorectomy and sphincterectomy 5 years ago for heavy bleeding.. She has been having hot flashes since then, moderate, but manageable. She is single, lives alone, works for 2 to [place] has to go up children who live in [place].  CURRENT THERAPY: Exemestane 25 mg daily, started on 08/11/2016  INTERIM HISTORY  [name] is here for a follow up. She presents to the clinic today reporting she is much better. She is on Celexa and she gets an occasional upset stomach and can make her drowsy. She will take it mid-afternoon. This helps her anxiety. The depression was coming and going and now it is much better. She is getting back to activities she likes. She is took off two weeks from work and goes back today. She is taking exemestane and has joint stiffness in her hands in the morning. She has hot flashes but they are tolerable. She denies any other concerns.     MED ICAL HISTORY:  Past Medical History:  Diagnosis Date  . Allergy   . Anxiety   . Breast cancer ([place]) 09/07/15   right breast  . Breast cancer of  upper-outer quadrant of right female breast ([place]) 09/09/2015  . Cancer [facility])    left brast lumpectomy   . Complication of anesthesia    slow to wake up- BP was low, fainted next day-  . Depression   . Fibroid   . GERD (gastroesophageal reflux disease)   . Infection    UTI  . MVP (mitral valve prolapse)   . Neuromuscular disorder ([place])    sciatic nerve paion left side/leg  . UTI (lower urinary tract infection)     SURGICAL HISTORY: Past Surgical History:  Procedure Laterality Date  . ABDOMINAL HYSTERECTOMY    . BREAST RECONSTRUCTION WITH PLACEMENT OF TISSUE EXPANDER AND FLEX HD (ACELLULAR HYDRATED DERMIS) Bilateral 11/25/2015   Procedure: BILATERAL IMMEDIATE BREAST RECONSTRUCTION WITH PLACEMENT OF TISSUE EXPANDERS AND FLEX HD (ACELLULAR HYDRATED DERMIS);  Surgeon: Loel Lofty Dillingham, DO;  Location: [facility];  Service: Plastics;  Laterality: Bilateral;  . BREAST SURGERY  2004   left lumpectomy  . MASTECTOMY W/ SENTINEL NODE BIOPSY Bilateral 11/25/2015   Procedure: BILATERAL SKIN SPARING MASTECTOMIES WITH RIGHT SENTINEL LYMPH NODE BIOPSY;  Surgeon: Stark Klein, MD;  Location: [place];  Service: General;  Laterality: Bilateral;  . OOPHORECTOMY Bilateral   . PORT-A-CATH REMOVAL Left 05/29/2017   Procedure: PORT REMOVAL;  Surgeon: Stark Klein, MD;  Location: [place] [facility];  Service: General;  Laterality: Left;  . PORTACATH PLACEMENT Left 12/20/2015   Procedure: INSERTION PORT-A-CATH, left chest;  Surgeon: Stark Klein, MD;  Location: [place] OR;  Service: General;  Laterality: Left;    SOCIAL HISTORY: Social History   Social History  . Marital Status: Single     Spouse Name: N/A  . Number of Children: 2 children, 50 daughter and 2 yo son    . Years of Education: N/A   Occupational History  . She is a Barista rep   Social History Main Topics  . Smoking status: Never Smoker   . Smokeless tobacco: Never Used  . Alcohol Use: Yes      Comment: 2-3/wk  . Drug Use: No  . Sexual Activity: Yes    Birth Control/ Protection: Surgical   Other Topics Concern  . Not on file   Social History Narrative    FAMILY HISTORY: Family History  Problem Relation Age of Onset  . Hypertension Mother   . Stroke Mother   . Alzheimer's disease Mother   . Heart disease Father   . Stroke Father   . Heart attack Father   . Breast cancer Sister 73       double mastectomy  . Other Sister        "stomach tumor that wrapped around reproductive organs"; dx. 2s; required partial hysterectomy  . Alzheimer's disease Maternal Grandmother   . Alzheimer's disease Paternal Grandmother   . Other Other        had to have lymph nodes removed and radiation; dx. 58s  . Cancer Maternal Aunt        unspecified type; dx. <50  . Colon cancer Maternal Uncle        dx. 55s  . Breast cancer Cousin        dx. 56s    ALLERGIES:  is allergic to lisinopril; oxycodone; and percocet [oxycodone-acetaminophen].  MEDICATIONS:  Current Outpatient Prescriptions  Medication Sig Dispense Refill  . ALPRAZolam (XANAX) 0.25 MG tablet Take 1 tablet (0.25 mg total) by mouth at bedtime as needed for anxiety. 20 tablet 0  . Calcium Carbonate-Vitamin D (CALTRATE 600+D) 600-400 MG-UNIT tablet Take 1 tablet by mouth daily.    . citalopram (CELEXA) 10 MG tablet Take 1 tablet (10 mg total) by mouth daily. 30 tablet 5  . exemestane (AROMASIN) 25 MG tablet TAKE 1 TABLET BY MOUTH DAILY AFTER BREAKFAST. 30 tablet 2  . ibuprofen (ADVIL,MOTRIN) 800 MG tablet Take 800 mg by mouth daily as needed for headache or moderate pain.    Marland Kitchen PRENATAL VIT-FE-FA-CA-DSS-DHA PO Take by mouth.    . zolpidem (AMBIEN CR) 12.5 MG CR tablet Take 1 tablet (12.5 mg total) by mouth at bedtime as needed for sleep. 30 tablet 2  . venlafaxine XR (EFFEXOR-XR) 75 MG 24 hr capsule Take 1 capsule (75 mg total) by mouth daily with breakfast. (Patient not taking: Reported on 08/20/2017) 30 capsule 1   No  current facility-administered medications  for this visit.     REVIEW OF SYSTEMS:   Constitutional: Denies fevers, chills or abnormal night sweats (+)tolerable hot flashes Eyes: Denies blurriness of vision, double vision or watery eyes Ears, nose, mouth, throat, and face: Denies mucositis or sore throat Respiratory: Denies cough, dyspnea or wheezes Cardiovascular: Denies palpitation, chest discomfort or lower extremity swelling Gastrointestinal:  Denies nausea, heartburn  Skin: Denies abnormal skin rashes Lymphatics: Denies new lymphadenopathy or easy bruising Neurological:Denies numbness, tingling or new weaknesses MSK: (+) joint stiffness in her hands Behavioral/Psych: (+)anxiety and mood swings All other systems were reviewed with the patient and are negative.  PHYSICAL EXAMINATION:  ECOG PERFORMANCE STATUS: 1 BP 103/67 (BP Location: Left Arm, Patient Position: Sitting)   Pulse 84   Temp 98.5 F (36.9 C) (Oral)   Resp 18   Ht _0  (1.676 m)   Wt 138 lb 3.2 oz (62.7 kg)   SpO2 100%   BMI 22.31 kg/m   GENERAL:alert, no distress and comfortable SKIN: skin color, texture, turgor are normal, no rashes or significant lesions EYES: normal, conjunctiva are pink and non-injected, sclera clear, eye lids appears normal OROPHARYNX:no exudate, no erythema and lips, buccal mucosa, and tongue normal  NECK: supple, thyroid normal size, non-tender, without nodularity LYMPH:  no palpable lymphadenopathy in the cervical, axillary or inguinal LUNGS: clear to auscultation and percussion with normal breathing effort HEART: regular rate & rhythm and no murmurs and no lower extremity edema ABDOMEN:abdomen soft, non-tender and normal bowel sounds Musculoskeletal:no cyanosis of digits and no clubbing  PSYCH: alert & oriented x 3 with fluent speech NEURO: no focal motor/sensory deficits Breasts: s/p bilateral mastectomy and tissue spender placement. Surgical incision sites are clean and well healed.      LABORATORY DATA:  I have reviewed the data as listed CBC Latest Ref Rng & Units 08/20/2017 06/21/2017 04/19/2017  WBC 3.9 - 10.3 10e3/uL 5.0 6.5 5.3  Hemoglobin 11.6 - 15.9 g/dL 13.6 12.8 11.8  Hematocrit 34.8 - 46.6 % 40.5 37.5 35.2  Platelets 145 - 400 10e3/uL 291 292 286    CMP Latest Ref Rng & Units 08/20/2017 06/21/2017 04/19/2017  Glucose 70 - 140 mg/dl 119 99 92  BUN 7.0 - 26.0 mg/dL 9.0 11.3 8.2  Creatinine 0.6 - 1.1 mg/dL 0.8 0.8 0.8  Sodium 136 - 145 mEq/L 139 140 141  Potassium 3.5 - 5.1 mEq/L 3.5 3.3(L) 3.7  Chloride 101 - 111 mmol/L - - -  CO2 22 - 29 mEq/L _1 Calcium 8.4 - 10.4 mg/dL 9.8 10.0 10.0  Total Protein 6.4 - 8.3 g/dL 7.8 8.0 7.5  Total Bilirubin 0.20 - 1.20 mg/dL 1.04 1.06 1.01  Alkaline Phos 40 - 150 U/L 67 68 72  AST 5 - 34 U/L _2 ALT 0 - 55 U/L _3 Pathology report Diagnosis 11/25/2015 1. Breast, simple mastectomy, right - INVASIVE DUCTAL CARCINOMA, GRADE 2/3, SPANNING 2.0 CM. - DUCTAL CARCINOMA IN SITU, INTERMEDIATE GRADE. - LYMPHOVASCULAR INVASION IS IDENTIFIED. - LOBULAR NEOPLASIA (ATYPICAL LOBULAR HYPERPLASIA). - THE SURGICAL RESECTION MARGINS ARE NEGATIVE FOR CARCINOMA. - SEE ONCOLOGY TADifficulty concentrationBLE DifficultyBELOW. 2. Lymph node, seShe reports some memory lossntinel, biopsy, right axilloss,lary #1 - ME loss,TASTATIC CARCINOMA IN 1 OF 1 LYMPH NODE (1/1). 3. Lymph node, sentinel, biopsy, right axillary #2 - THERE IS NO EVIDENCE OF CARCINOMA IN 1 OF 1 LYMPH NODE (0/1). 4. Lymph node, sentinel, biopsy, right axillary #3 - THERE IS NO EVIDENCE  OF CARCINOMA IN 1 OF 1 LYMPH NODE (0/1). 5. Breast, simple mastectomy, left - BENIGN BREAST PARENCHYMA WITH DENSE STROMAL FIBROSIS. - FIBROADENOMA. - THERE IS NO EVIDENCE OF MALIGNANCY. 6. Breast, excision, left, additional lateral margin - BENIGN FIBROADIPOSE TISSUE. - THERE IS NO EVIDENCE OF MALIGNANCY. - SEE COMMENT. Microscopic Comment 1. BREAST, INVASIVE  TUMOR, WITH LYMPH NODES PRESENT Specimen, including laterality and lymph node sampling (sentinel, non-sentinel): Right breast and right axillary lymph nodes Procedure: Bilateral mastectomy and multiple right axillary lymph node resections Histologic type: Ductal Grade: 2 Tubule formation: 2 1 of 4 FINAL for Aschoff, Jezelle ([id number]) Microscopic Comment(continued) Nuclear pleomorphism: 2 Mitotic: 2 Tumor size (gross measurement): 2.0 cm Margins: Negative for carcinoma Invasive, distance to closest margin: 1.8 cm to the posterior margin In-situ, distance to closest margin: 1.8 cm to the posterior margin Lymphovascular invasion: Present Ductal carcinoma in situ: Present Grade: Intermediate grade Extensive intraductal component: Not identified Lobular neoplasia: Present, atypical lobular hyperplasia Tumor focality: Unifocal Treatment effect: Not identified Extent of tumor: Confined to breast parenchyma Lymph nodes: Examined: 3 Sentinel 0 Non-sentinel 3 Total Lymph nodes with metastasis: 1 (macrometastasis) - no extracapsular extension is identified. Breast prognostic profile: [phone] Estrogen receptor: 100%, strong staining intensity Progesterone receptor: 100%, strong staining intensity Her 2 neu: Amplification was detected. The ratio was 2.58 Ki-67: 60% TNM: pT1c, pN1a Non-neoplastic breast: No significant findings. 6. The surgical resection margin(s) of the specimen were inked and microscopically evaluated. Enid Cutter MD Pathologist, Electronic Signature (Case signed 11/29/2015)   RADIOGRAPHIC STUDIES: I have personally reviewed the radiological images as listed and agreed with the findings in the report.  Echo 01/02/2017 Impressions: - Compared to a prior study in 07/2016, the LVEF is stable however,   LV strain is worse at -15% with inferolateral strain abnormality.  Bone scan 11/13/2016 IMPRESSION: IMPRESSION: 1. No evidence for metastatic disease. 2.  Lumbar spine degenerative disc disease.   CT abdomen and pelvis w contrast 11/13/2016 IMPRESSION: 1.8 cm hypervascular lesion in segment 6, unchanged. Given stability, a benign etiology such as FNH or flash filling hemangioma is favored. Metastasis is difficult to exclude but is considered less likely.  1.9 cm lesion in segment 3, compatible with a benign hemangioma.  No findings specific for metastatic disease in the abdomen. Please note that the pelvis was not imaged.   CT head w wo contrast 11/13/2016 IMPRESSION: Negative CT head with contrast.  ECHO 01/02/17  Impressions: - Compared to a prior study in 07/2016, the LVEF is stable however,   LV strain is worse at -15% with inferolateral strain abnormality.  ECHO 08/02/2016 Impressions: - Normal LV size with EF 55%. Strain as noted above. Normal RV size   and systolic function.  ASSESSMENT & PLAN:  51 y.o. African-American female, surgical postmenopausal, history of stage I left breast cancer, with newly diagnosed right breast cancer.  1. Anxiety and depression  -She reports anxiety attack, an overall depressed mood since she completed her major breast cancer treatment. -She also has some cognitive dysfunction when she is very anxious, she was seen for this a few weeks ago. -She did not feel better after counseling with our Education officer, museum and chaplain previously, but needs some medication for her anxiety attack. -I previously recommended her to consider SSRI for her depression and anxiety. She tried Effexor before, but tolerated poorly  -I previously prescribed a small prescription of Xanax, to use as needed for panic attack. 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 She agrees.  -I also encouraged her to exercise routinely, such as yoga, and meditation. -While on Celexa she her anxiety and depression has much improved, continue Celexa at same dose  2. Right breast invasive ductal  carcinoma, pT1cN1aM0, stage IIB, grade 2, ER100%+/PR100%+/HER2+ -I previously reviewed her surgical pathology results with patient in great details. She has 1 out of 3 sentinel lymph nodes positive, her pathological stage is more advanced than her initial clinical stage.  -We reviewed the natural history of triple positive breast cancer. HER-2 positive tumors are more progressive, with higher risk of recurrence -I reviewed her repeated CT and bone scan images from 11/13/2016 with pt in detail, which are negative for metastatic disease. She has 2 small liver lesions, likely benign, unchanged. Unfortunately she is not able to do liver MRI, due to her breast implants.  -She has completed adjuvant chemotherapy TCHP, Herceptin and pejeta were held for the last 2 cycle due to her decreased EF, she has completed maintenance Herceptin and pejeta in March 2018.  -Her repeated echo has showed normal EF, she will continue Herceptin maintenance therapy. -She  has completed adjuvant breast and axilla radiation and tolerated well. -She previously could not tolerate letrozole and it was switched to exemestane, she tolerates it well lately, will continue for 5-7 years.  -She still had moderate hot flash, tolerable. She tried Effexor, did not help. -she tried neratinib, a recently FDA approved oral her2 antibody, but could not tolerate due to side effects. She has stopped it.  -She will follow-up with her cardiologist, possible have another repeat echo in a few months. -We reviewed to breast cancer surveillance. She is status post bilateral mastectomy, no need to routine mammogram. I previously encouraged her to do self exam. She'll follow-up with [place] every 3-6 months for labs and exam. -Port was removed 05/29/17 -CBC and CMP are within normal limits, adequate to continue Exemestane  -f/u in 3 months     3. Genetics -Due to her young age and recurrent breast cancer, she was referred to see a genetic counselor in our  cancer center. -Her genetic test was negative  4. DM -she will continue follow-up with her primary care physician -She is on metformin  5. Bone health -She is postmenopausal by surgery - her bone density scan from December 2017 was normal, -  I previously reviewed the CT scan and bone density scan with her. I previously reccommened her to see a  Doctor, general practice.  - I previously encouraged her to take vitamin D and calcium, along with drinking more water. She is compliant   6. Insomnia and low appetite  - improved some, continue mirtazapine -previously has not been able to wean off Lorrin Mais, will continue for now   7. Hot flush -Secondary to menopause and exemestane, improved, manageable- -she previously tried Effexor, didn't feel helpful. We restart on 07/13/17 at 65m for her mood swings and anxiety  8. Mood swing   - I have previously advised the patient that her exemestane could be causing her anxious and agitated moods. Patient says she can handle it for now. If it gets out of hand, she will call me. -She previously tried Effexor, did not tolerate well.  - I have previously advised the patient to exercise, she plans to try yoga or meditation -she is on celexa now, tolerating well and mood swing has much improved    Plan for today  -Continue Exemestane and celexa  -Lab and f/u  in 3 months    All questions were answered. The patient knows to call the clinic with any problems, questions or concerns.  I spent 20 minutes counseling the patient face to face. The total time spent in the appointment was 25 minutes and more than 50% was on counseling.  This document serves as a record of services personally performed by Truitt Merle, MD. It was created on her behalf by Joslyn Devon, a trained medical scribe. The creation of this record is based on the scribe's personal observations and the provider's statements to them. This document has been checked and approved by the attending  provider.    Truitt Merle  08/20/2017

## 2017-08-20 ENCOUNTER — Other Ambulatory Visit (HOSPITAL_BASED_OUTPATIENT_CLINIC_OR_DEPARTMENT_OTHER): Payer: BLUE CROSS/BLUE SHIELD

## 2017-08-20 ENCOUNTER — Ambulatory Visit (HOSPITAL_BASED_OUTPATIENT_CLINIC_OR_DEPARTMENT_OTHER): Payer: BLUE CROSS/BLUE SHIELD | Admitting: Hematology

## 2017-08-20 ENCOUNTER — Encounter: Payer: Self-pay | Admitting: Hematology

## 2017-08-20 ENCOUNTER — Other Ambulatory Visit: Payer: Self-pay | Admitting: Hematology

## 2017-08-20 VITALS — BP 103/67 | HR 84 | Temp 98.5°F | Resp 18 | Ht 66.0 in | Wt 138.2 lb

## 2017-08-20 DIAGNOSIS — F329 Major depressive disorder, single episode, unspecified: Secondary | ICD-10-CM

## 2017-08-20 DIAGNOSIS — C50411 Malignant neoplasm of upper-outer quadrant of right female breast: Secondary | ICD-10-CM | POA: Diagnosis not present

## 2017-08-20 DIAGNOSIS — E119 Type 2 diabetes mellitus without complications: Secondary | ICD-10-CM

## 2017-08-20 DIAGNOSIS — F419 Anxiety disorder, unspecified: Secondary | ICD-10-CM | POA: Diagnosis not present

## 2017-08-20 DIAGNOSIS — Z170 Estrogen receptor positive status [ER+]: Principal | ICD-10-CM

## 2017-08-20 LAB — CBC WITH DIFFERENTIAL/PLATELET
BASO%: 0.5 % (ref 0.0–2.0)
BASOS ABS: 0 10*3/uL (ref 0.0–0.1)
EOS%: 1.5 % (ref 0.0–7.0)
Eosinophils Absolute: 0.1 10*3/uL (ref 0.0–0.5)
HCT: 40.5 % (ref 34.8–46.6)
HGB: 13.6 g/dL (ref 11.6–15.9)
LYMPH%: 25.6 % (ref 14.0–49.7)
MCH: 31.2 pg (ref 25.1–34.0)
MCHC: 33.6 g/dL (ref 31.5–36.0)
MCV: 92.9 fL (ref 79.5–101.0)
MONO#: 0.3 10*3/uL (ref 0.1–0.9)
MONO%: 5.7 % (ref 0.0–14.0)
NEUT#: 3.3 10*3/uL (ref 1.5–6.5)
NEUT%: 66.7 % (ref 38.4–76.8)
Platelets: 291 10*3/uL (ref 145–400)
RBC: 4.37 10*6/uL (ref 3.70–5.45)
RDW: 12.9 % (ref 11.2–14.5)
WBC: 5 10*3/uL (ref 3.9–10.3)
lymph#: 1.3 10*3/uL (ref 0.9–3.3)

## 2017-08-20 LAB — COMPREHENSIVE METABOLIC PANEL
ALT: 9 U/L (ref 0–55)
AST: 15 U/L (ref 5–34)
Albumin: 4.1 g/dL (ref 3.5–5.0)
Alkaline Phosphatase: 67 U/L (ref 40–150)
Anion Gap: 10 mEq/L (ref 3–11)
BUN: 9 mg/dL (ref 7.0–26.0)
CHLORIDE: 102 meq/L (ref 98–109)
CO2: 27 meq/L (ref 22–29)
Calcium: 9.8 mg/dL (ref 8.4–10.4)
Creatinine: 0.8 mg/dL (ref 0.6–1.1)
EGFR: >90 mL/min/{1.73_m2} (ref >90)
GLUCOSE: 119 mg/dL (ref 70–140)
POTASSIUM: 3.5 meq/L (ref 3.5–5.1)
SODIUM: 139 meq/L (ref 136–145)
Total Bilirubin: 1.04 mg/dL (ref 0.20–1.20)
Total Protein: 7.8 g/dL (ref 6.4–8.3)

## 2017-08-20 MED ORDER — CITALOPRAM HYDROBROMIDE 10 MG PO TABS: 10.0000 mg | ORAL_TABLET | Freq: Every day | ORAL | 5 refills | Status: DC

## 2017-08-20 NOTE — Telephone Encounter (Signed)
Addressed at [place] 12/2016

## 2017-08-21 ENCOUNTER — Telehealth: Payer: Self-pay | Admitting: Hematology

## 2017-08-21 NOTE — Telephone Encounter (Signed)
Spoke to patient regarding upcoming December appointments.  °

## 2017-08-22 ENCOUNTER — Other Ambulatory Visit: Payer: Self-pay | Admitting: *Deleted

## 2017-08-22 DIAGNOSIS — C50411 Malignant neoplasm of upper-outer quadrant of right female breast: Secondary | ICD-10-CM

## 2017-08-22 DIAGNOSIS — Z170 Estrogen receptor positive status [ER+]: Principal | ICD-10-CM

## 2017-08-22 MED ORDER — CITALOPRAM HYDROBROMIDE 10 MG PO TABS: 10.0000 mg | ORAL_TABLET | Freq: Every day | ORAL | 5 refills | Status: DC

## 2017-08-22 MED FILL — CITALOPRAM HBR 10 MG TABLET: 10 | 30 days supply | Qty: 30 | Fill #0

## 2017-08-30 ENCOUNTER — Encounter: Payer: Self-pay | Admitting: General Practice

## 2017-08-30 NOTE — Progress Notes (Signed)
[place] Spiritual Care Note  Reached Sandra James by phone very briefly while she was at work.  Per pt, she was tickled to receive the f/u support because she had missed my calls.  We plan for me to target my calls for her 1:00 lunch hour so that she is able to talk during the business day.  Plan to phone her again early next week.   [place], [place], [facility] Pager [phone] Voicemail [phone]

## 2017-09-03 ENCOUNTER — Encounter: Payer: Self-pay | Admitting: General Practice

## 2017-09-03 NOTE — Progress Notes (Signed)
[place] Note  Reached Sandra James by phone on her lunch hour as planned.  She reports that she is doing "great--much, much better!"  Per pt, celexa is helping a lot:  "I'm much, much clearer-headed, I'm getting out of my shell..I probably should have been on this a long time ago!"  She has attended breast cancer support group, gone out to dinner with 8-10 folks from the group, and is finding FYNN (Finding Your New Normal, our survivorship series) "even more meaningful than support group."   We plan to continue phone check-ins, and she knows to contact Support Team at anytime as well.   [place], [place], [facility] Pager [phone] Voicemail [phone]

## 2017-09-06 ENCOUNTER — Other Ambulatory Visit: Payer: Self-pay | Admitting: *Deleted

## 2017-09-06 DIAGNOSIS — Z170 Estrogen receptor positive status [ER+]: Principal | ICD-10-CM

## 2017-09-06 DIAGNOSIS — C50411 Malignant neoplasm of upper-outer quadrant of right female breast: Secondary | ICD-10-CM

## 2017-09-07 ENCOUNTER — Other Ambulatory Visit: Payer: Self-pay | Admitting: *Deleted

## 2017-09-07 DIAGNOSIS — Z170 Estrogen receptor positive status [ER+]: Principal | ICD-10-CM

## 2017-09-07 DIAGNOSIS — C50411 Malignant neoplasm of upper-outer quadrant of right female breast: Secondary | ICD-10-CM

## 2017-09-07 MED ORDER — EXEMESTANE 25 MG PO TABS
ORAL_TABLET | ORAL | 2 refills | Status: AC
Start: 2017-09-07 — End: ?

## 2017-09-07 MED FILL — ZOLPIDEM TART ER 12.5 MG TA: 12.5 | 30 days supply | Qty: 30 | Fill #0

## 2017-09-07 MED FILL — EXEMESTANE 25 MG TABLET: 25 | 30 days supply | Qty: 30 | Fill #0

## 2017-09-14 ENCOUNTER — Encounter: Payer: Self-pay | Admitting: General Practice

## 2017-09-14 NOTE — Progress Notes (Signed)
[place] Spiritual Care Note  Reached Sandra James by phone during her lunch hour.  She was in good spirits, sharing about celebrating her birthday (Wednesday), volunteering for the [employer] Walk[employer]Run, and realizing that she has been so busy that she has lost track of some priorities, such as walking/exercise.  This was the first time she has volunteered for something and proved to be meaningful, encouraging, and empowering.  Celebrated with [name] that she has reached a new level of feeling better and engaging socially like she had wanted.  Also normalized the need for balance and pacing; she found it helpful to transform her self-criticism ("not being able to do it all" = personal failure) into self-encouragement ("I'm making great progress toward a goal that's a moving target"). [place] knows to contact Support Team with any needs or questions.   [place], [place], [facility] Pager [phone] Voicemail [phone]

## 2017-09-18 MED FILL — CITALOPRAM HBR 10 MG TABLET: 10 | 30 days supply | Qty: 30 | Fill #1

## 2017-10-03 ENCOUNTER — Telehealth: Payer: Self-pay

## 2017-10-03 NOTE — Telephone Encounter (Signed)
I called her back and we reviewed the medications for insomnia she has tried, I encourage her to try behavorial measures such as reading, music, etc before bedtime, and we also discussed sleep hygiene. She can also discussed this with her PCP. She agrees with the plan, appreciated the call.   Truitt Merle MD

## 2017-10-03 NOTE — Telephone Encounter (Signed)
Pt cannot sleep after this oncology journey. She is currently taking ambien 12.5, she added Z-quil OTC sleep aid, she is also taking celexa at night time. This is creating stomach upset, no N/V/D. Is there something else she can take for sleep.  Melatonin and benadryl do not help. She is also modifying her night environment for darkness.

## 2017-10-05 ENCOUNTER — Other Ambulatory Visit: Payer: Self-pay | Admitting: Hematology

## 2017-10-05 DIAGNOSIS — C50411 Malignant neoplasm of upper-outer quadrant of right female breast: Secondary | ICD-10-CM

## 2017-10-05 DIAGNOSIS — Z170 Estrogen receptor positive status [ER+]: Principal | ICD-10-CM

## 2017-10-09 ENCOUNTER — Ambulatory Visit: Payer: Self-pay | Admitting: Psychology

## 2017-10-10 ENCOUNTER — Other Ambulatory Visit: Payer: Self-pay | Admitting: *Deleted

## 2017-10-10 DIAGNOSIS — Z170 Estrogen receptor positive status [ER+]: Principal | ICD-10-CM

## 2017-10-10 DIAGNOSIS — C50411 Malignant neoplasm of upper-outer quadrant of right female breast: Secondary | ICD-10-CM

## 2017-10-10 MED ORDER — ZOLPIDEM TARTRATE ER 12.5 MG PO TBCR: 12.5000 mg | EXTENDED_RELEASE_TABLET | Freq: Every evening | ORAL | 2 refills | Status: DC | PRN

## 2017-10-11 ENCOUNTER — Encounter: Payer: Self-pay | Admitting: General Practice

## 2017-10-11 NOTE — Progress Notes (Signed)
[facility] Spiritual Care Note  Ermal values f/u calls, so LVM of support and encouragement, including callback number.   [place], [place], [place] Pager [phone] Voicemail [phone]

## 2017-10-16 MED FILL — ZOLPIDEM TART ER 12.5 MG TA: 12.5 | 30 days supply | Qty: 30 | Fill #0

## 2017-10-22 MED FILL — CITALOPRAM HBR 10 MG TABLET: 10 | 30 days supply | Qty: 30 | Fill #2

## 2017-11-12 ENCOUNTER — Encounter: Payer: Self-pay | Admitting: General Practice

## 2017-11-12 NOTE — Progress Notes (Signed)
[facility] Spiritual Care Note  Quenisha came by my office today for spiritual and emotional support in dealing with anxiety and processing behavior patterns, expectations, plans, and judgments that she's noticing within herself. She used the opportunity well to address stressors, hopes, plans, and changes in herself that are resulting in personal growth and opportunity for reflection.  She left much less anxious and more focused on immediate next steps, with more ability to slow down her own thinking and action. She plans to f/u by phone within the next week+ after she has a big event this weekend. As always, please also page if immediate needs arise. Thank you.   [place], [place], [facility] Pager [phone] Voicemail [phone]

## 2017-11-19 ENCOUNTER — Ambulatory Visit: Payer: BLUE CROSS/BLUE SHIELD | Admitting: Hematology

## 2017-11-19 ENCOUNTER — Other Ambulatory Visit: Payer: BLUE CROSS/BLUE SHIELD

## 2017-11-22 MED FILL — CITALOPRAM HBR 10 MG TABLET: 10 | 30 days supply | Qty: 30 | Fill #3

## 2017-11-22 MED FILL — EXEMESTANE 25 MG TABLET: 25 | 30 days supply | Qty: 30 | Fill #2

## 2017-11-23 MED FILL — ZOLPIDEM TART ER 12.5 MG TA: 12.5 | 30 days supply | Qty: 30 | Fill #1

## 2017-11-26 ENCOUNTER — Telehealth: Payer: Self-pay | Admitting: Hematology

## 2017-11-26 NOTE — Telephone Encounter (Signed)
Rescheduled appt from 12/10 - spoke with patient regarding appointments.

## 2017-11-28 NOTE — Progress Notes (Signed)
[place]  [phone] [phone]  Clinic follow Up Note   Patient Care Team: Kristie Cowman, MD as PCP - General (Family Medicine) Stark Klein, MD as Consulting Physician (General Surgery) Truitt Merle, MD as Consulting Physician (Hematology) Mauro Kaufmann, RN as Registered Nurse Rockwell Germany, RN as Registered Nurse Cause[place], Charlestine Massed, NP as Nurse Practitioner (Hematology and Oncology) Kyung Rudd, MD as Consulting Physician (Radiation Oncology)   Date of Service:  11/29/2017  CHIEF COMPLAINTS:  follow up of Right Breast cancer  Oncology History   Cancer Staging Breast cancer of upper-outer quadrant of right female breast [facility]) Staging form: Breast, AJCC 7th Edition - Clinical stage from 09/15/2015: Stage IA (T1b, N0, M0) - Signed by Truitt Merle, MD on 12/16/2015 - Pathologic stage from 11/25/2015: Stage IIA (T1c, N1a, cM0) - Signed by Truitt Merle, MD on 12/16/2015       Breast cancer of upper-outer quadrant of right female breast ([place])   09/07/2015 Mammogram    Diagnostic mammogram and the ultrasound of the right breast showed a 0.8 cm lobulated mass in the right breast 11 to 12:00 position 9 cm from the nipple. No other lesions or adenopathy.      09/08/2015 Initial Diagnosis    Breast cancer of upper-outer quadrant of right female breast ([place])      09/08/2015 Initial Biopsy    Right breast mass at the upper outer quadrant core needle biopsy showed invasive ductal carcinoma, grade 2-3, ductal carcinoma in situ.      09/08/2015 Receptors her2    ER 100% positive, PR 100% positive, Ki-67 60%, HER-2 positive with copy # 4.25 and ratio 2.58      09/28/2015 Genetic Testing    Genetic testing was normal, and did not reveal a deleterious mutation in these genes.  Additionally, no variants of uncertain significance (VUSes) were found.  Genes tested include: ATM, BARD1, BRCA1, BRCA2, BRIP1, CDH1, CHEK2,  FANCC, MLH1, MSH2, MSH6, NBN, PALB2,  PMS2, PTEN, RAD51C, RAD51D, TP53, and XRCC2.  This panel also includes deletion/duplication analysis (without sequencing) for one gene, EPCAM.      11/25/2015 Surgery    Right breast mastectomy and sentinel lymph node biopsy      11/25/2015 Pathology Results    Right breast invasive ductal carcinoma, grade 2, 2.0 cm, DCIS, intermediate grade, (+) LVI, margins were negative, 1 out of 3 lymph nodes were negative. Left breast ostectomy was negative for malignancy.      01/06/2016 - 04/20/2016 Chemotherapy    adjuvant chemotherapy TCH P, every 3 weeks, for a total of 6 cycles. Herceptin and Perjeta were held for last two cycles due to drop of her EF on echo       05/25/2016 - 07/11/2016 Radiation Therapy    Adjuvant breast radiation [facility]): 1. The Right chest wall (4-field) was treated to 50.4 Gy in 28 fractions at 1.8 Gy per fraction. 2. The Right chest wall was boosted to 10 Gy in 5 fractions at 2 Gy per fraction.  3. The Right supraclavicular region was treated to 50.4 Gy in 28 fractions at 1.8 Gy per fraction.      06/08/2016 - 03/08/2017 Chemotherapy    maintenance Herceptin every 3 weeks, after clearance from cardiology.      08/11/2016 -  Anti-estrogen oral therapy    Exemestane 25 mg daily      11/13/2016 Imaging    Bone Scan IMPRESSION: 1. No evidence for metastatic disease. 2. Lumbar spine  degenerative disc disease.      11/13/2016 Imaging    CT Abdomen W Contrast IMPRESSION: 1.8 cm hypervascular lesion in segment 6, unchanged. Given stability, a benign etiology such as FNH or flash filling hemangioma is favored. Metastasis is difficult to exclude but is considered less likely. 1.9 cm lesion in segment 3, compatible with a benign hemangioma. No findings specific for metastatic disease in the abdomen. Please note that the pelvis was not imaged.      11/13/2016 Imaging    CT Head W Contrast IMPRESSION: Negative CT head with contrast.      01/02/2017 Imaging    Echo  01/02/2017 Impressions: - Compared to a prior study in 07/2016, the LVEF is stable however,   LV strain is worse at -15% with inferolateral strain abnormality.       HISTORY OF PRESENTING ILLNESS:  Sandra James 51 y.o. female with past medical history of stage I left breast cancer, is here because of newly diagnosed right breast cancer. She presents to our multidisciplinary breast clinic by herself.  The right breast cancer was discovered by screening mammogram. She did not have any palpable mass, or any constitutional symptoms. She was diagnosed with stage I left breast cancer at age of 42, she had lumpectomy, radiation, and adjuvant chemotherapy and 5 years of tamoxifen. She was treated by Dr. Louann Sjogren in [place]. She moved to [facility] about year ago due to job change. She has been very compliant with annual screening mammogram.  She had hysterectomy and bilateral oophorectomy and sphincterectomy 5 years ago for heavy bleeding.. She has been having hot flashes since then, moderate, but manageable. She is single, lives alone, works for 2 to [place] has to go up children who live in [place].  CURRENT THERAPY: Exemestane 25 mg daily, started on 08/11/2016  INTERIM HISTORY  Sandra James is here for a follow up. She presents to the clinic today noting she is doing well and plans to have her reconstruction surgery next week.  Her anxiety still comes and goes but she feels better. If she does not take something to sleep such as her Ambien she will be in and out of sleep. She notes her feet feels sore often and her hands are stiff in the morning. She has terrible hot flashes but she tries to manage it. At nights it can be worse. She is currently on 43m Celexa but is willing to increase dose. She notes she has gained weight on this medication, about 12 pounds. She reports to having right arm lymphedema. She does use compression sleeve. She has previously gone to PT and continues to  exercise.      MEDICAL HISTORY:  Past Medical History:  Diagnosis Date  . Allergy   . Anxiety   . [place]   right breast  . Breast cancer of [place] quadrant of right [place] breast [place] 09/09/2015  . Cancer ([facility]    left brast lumpectomy   . Complication of anesthesia    slow to wake up- BP was low, fainted next day-  . Depression   . Fibroid   . GERD (gastroesophageal reflux disease)   . Infection    UTI  . MVP (mitral valve prolapse)   . [place]romuscular disorder ([place]    sciatic nerve paion left side/leg  . UTI (lower urinary tract infection)     SURGICAL HISTORY: Past Surgical History:  Procedure Laterality Date  . ABDOMINAL HYSTERECTOMY    .  BREAST RECONSTRUCTION WITH PLACEMENT OF TISSUE EXPANDER AND FLEX HD (ACELLULAR HYDRATED DERMIS) Bilateral 11/25/2015   Procedure: BILATERAL IMMEDIATE BREAST RECONSTRUCTION WITH PLACEMENT OF TISSUE EXPANDERS AND FLEX HD (ACELLULAR HYDRATED DERMIS);  Surgeon: Wallace Going, DO;  Location: [place];  Service: Plastics;  Laterality: Bilateral;  . BREAST SURGERY  2004   left lumpectomy  . MASTECTOMY W/ SENTINEL NODE BIOPSY Bilateral 11/25/2015   Procedure: BILATERAL SKIN SPARING MASTECTOMIES WITH RIGHT SENTINEL LYMPH NODE BIOPSY;  Surgeon: Stark Klein, MD;  Location: [place];  Service: General;  Laterality: Bilateral;  . OOPHORECTOMY Bilateral   . PORT-A-CATH REMOVAL Left 05/29/2017   Procedure: PORT REMOVAL;  Surgeon: Stark Klein, MD;  Location: [place];  Service: General;  Laterality: Left;  . PORTACATH PLACEMENT Left 12/20/2015   Procedure: INSERTION PORT-A-CATH, left chest;  Surgeon: Stark Klein, MD;  Location: [facility] [facility];  Service: General;  Laterality: Left;    SOCIAL HISTORY: Social History   Social History  . Marital Status: Single     Spouse Name: N/A  . Number of Children: 2 children, 59 daughter and 106 yo son    . Years of Education: N/A    Occupational History  . She is a Barista rep   Social History Main Topics  . Smoking status: Never Smoker   . Smokeless tobacco: Never Used  . Alcohol Use: Yes     Comment: 2-3/wk  . Drug Use: No  . Sexual Activity: Yes    Birth Control/ Protection: Surgical   Other Topics Concern  . Not on file   Social History Narrative    FAMILY HISTORY: Family History  Problem Relation Age of Onset  . Hypertension Mother   . Stroke Mother   . Alzheimer's disease Mother   . Heart disease Father   . Stroke Father   . Heart attack Father   . Breast cancer Sister 41       double mastectomy  . Other Sister        "stomach tumor that wrapped around reproductive organs"; dx. 16s; required partial hysterectomy  . Alzheimer's disease Maternal Grandmother   . Alzheimer's disease Paternal Grandmother   . Other Other        had to have lymph nodes removed and radiation; dx. 85s  . Cancer Maternal Aunt        unspecified type; dx. <50  . Colon cancer Maternal Uncle        dx. 29s  . Breast cancer Cousin        dx. 31s    ALLERGIES:  is allergic to lisinopril; oxycodone; and percocet [oxycodone-acetaminophen].  MEDICATIONS:  Current Outpatient Medications  Medication Sig Dispense Refill  . Calcium Carbonate-Vitamin D (CALTRATE 600+D) 600-400 MG-UNIT tablet Take 1 tablet by mouth daily.    . cholecalciferol (VITAMIN D) 1000 units tablet Take 1,000 Units by mouth daily.    Marland Kitchen exemestane (AROMASIN) 25 MG tablet TAKE 1 TABLET BY MOUTH DAILY AFTER BREAKFAST. 30 tablet 2  . ibuprofen (ADVIL,MOTRIN) 800 MG tablet Take 800 mg by mouth daily as needed for headache or moderate pain.    Marland Kitchen PRENATAL VIT-FE-FA-CA-DSS-DHA PO Take by mouth.    . zolpidem (AMBIEN CR) 12.5 MG CR tablet Take 1 tablet (12.5 mg total) by mouth at bedtime as needed for sleep. 30 tablet 2  . citalopram (CELEXA) 20 MG tablet Take 1 tablet (20 mg total) by mouth daily. 30 tablet 2   No current facility-administered  medications for this visit.     REVIEW OF SYSTEMS:   Constitutional: Denies fevers, chills or abnormal night sweats (+) significant hot flashes, worse at night (+) insomnia, manageable  Eyes: Denies blurriness of vision, double vision or watery eyes Ears, nose, mouth, throat, and face: Denies mucositis or sore throat Respiratory: Denies cough, dyspnea or wheezes Cardiovascular: Denies palpitation, chest discomfort or lower extremity swelling Gastrointestinal:  Denies nausea, heartburn  Skin: Denies abnormal skin rashes Lymphatics: Denies easy bruising (+) Right arm lymph edema  Neurological:Denies numbness, tingling or new weaknesses MSK: (+) joint stiffness in her hands and soreness in feet Behavioral/Psych: (+)anxiety, improved All other systems were reviewed with the patient and are negative.  PHYSICAL EXAMINATION:  ECOG PERFORMANCE STATUS: 1 BP 108/69 (BP Location: Left Arm, Patient Position: Sitting)   Pulse 94   Temp 99.8 F (37.7 C) (Oral)   Resp 18   Ht 5' 6" (1.676 m)   Wt 150 lb 12.8 oz (68.4 kg)   SpO2 99%   BMI 24.34 kg/m    GENERAL:alert, no distress and comfortable SKIN: skin color, texture, turgor are normal, no rashes or significant lesions EYES: normal, conjunctiva are pink and non-injected, sclera clear, eye lids appears normal OROPHARYNX:no exudate, no erythema and lips, buccal mucosa, and tongue normal  NECK: supple, thyroid normal size, non-tender, without nodularity LYMPH:  no palpable lymphadenopathy in the cervical, axillary or inguinal LUNGS: clear to auscultation and percussion with normal breathing effort HEART: regular rate & rhythm and no murmurs and no lower extremity edema ABDOMEN:abdomen soft, non-tender and normal bowel sounds Musculoskeletal:no cyanosis of digits and no clubbing  PSYCH: alert & oriented x 3 with fluent speech NEURO: no focal motor/sensory deficits Breasts: s/p bilateral mastectomy and tissue spender placement. Surgical  incision sites are clean and well healed. No palpable mass or adenopathy    LABORATORY DATA:  I have reviewed the data as listed CBC Latest Ref Rng & Units 11/29/2017 08/20/2017 06/21/2017  WBC 3.9 - 10.3 10e3/uL 5.6 5.0 6.5  Hemoglobin 11.6 - 15.9 g/dL 12.8 13.6 12.8  Hematocrit 34.8 - 46.6 % 37.7 40.5 37.5  Platelets 145 - 400 10e3/uL 285 291 292    CMP Latest Ref Rng & Units 11/29/2017 08/20/2017 06/21/2017  Glucose 70 - 140 mg/dl 102 119 99  BUN 7.0 - 26.0 mg/dL 8.9 9.0 11.3  Creatinine 0.6 - 1.1 mg/dL 0.8 0.8 0.8  Sodium 136 - 145 mEq/L 140 139 140  Potassium 3.5 - 5.1 mEq/L 3.6 3.5 3.3(L)  Chloride 101 - 111 mmol/L - - -  CO2 22 - 29 mEq/L _0 Calcium 8.4 - 10.4 mg/dL 9.5 9.8 10.0  Total Protein 6.4 - 8.3 g/dL 7.7 7.8 8.0  Total Bilirubin 0.20 - 1.20 mg/dL 0.56 1.04 1.06  Alkaline Phos 40 - 150 U/L 83 67 68  AST 5 - 34 U/L _1 ALT 0 - 55 U/L _2 Pathology report Diagnosis 11/25/2015 1. Breast, simple mastectomy, right - INVASIVE DUCTAL CARCINOMA, GRADE 2/3, SPANNING 2.0 CM. - DUCTAL CARCINOMA IN SITU, INTERMEDIATE GRADE. - LYMPHOVASCULAR INVASION IS IDENTIFIED. - LOBULAR NEOPLASIA (ATYPICAL LOBULAR HYPERPLASIA). - THE SURGICAL RESECTION MARGINS ARE NEGATIVE FOR CARCINOMA. - SEE ONCOLOGY TADifficulty concentrationBLE DifficultyBELOW. 2. Lymph node, sentinel, biopsy, right axilloss,lary #1 - ME loss,TASTATIC CARCINOMA IN 1 OF 1 LYMPH NODE (1/1). 3. Lymph node, sentinel, biopsy, right axillary #2 - THERE IS NO EVIDENCE OF CARCINOMA IN 1 OF 1 LYMPH  NODE (0/1). 4. Lymph node, sentinel, biopsy, right axillary #3 - THERE IS NO EVIDENCE OF CARCINOMA IN 1 OF 1 LYMPH NODE (0/1). 5. Breast, simple mastectomy, left - BENIGN BREAST PARENCHYMA WITH DENSE STROMAL FIBROSIS. - FIBROADENOMA. - THERE IS NO EVIDENCE OF MALIGNANCY. 6. Breast, excision, left, additional lateral margin - BENIGN FIBROADIPOSE TISSUE. - THERE IS NO EVIDENCE OF MALIGNANCY. - SEE  COMMENT. Microscopic Comment 1. BREAST, INVASIVE TUMOR, WITH LYMPH NODES PRESENT Specimen, including laterality and lymph node sampling (sentinel, non-sentinel): Right breast and right axillary lymph nodes Procedure: Bilateral mastectomy and multiple right axillary lymph node resections Histologic type: Ductal Grade: 2 Tubule formation: 2 1 of 4 FINAL for Pickelsimer, Janyiah ([id number]) Microscopic Comment(continued) Nuclear pleomorphism: 2 Mitotic: 2 Tumor size (gross measurement): 2.0 cm Margins: Negative for carcinoma Invasive, distance to closest margin: 1.8 cm to the posterior margin In-situ, distance to closest margin: 1.8 cm to the posterior margin Lymphovascular invasion: Present Ductal carcinoma in situ: Present Grade: Intermediate grade Extensive intraductal component: Not identified Lobular neoplasia: Present, atypical lobular hyperplasia Tumor focality: Unifocal Treatment effect: Not identified Extent of tumor: Confined to breast parenchyma Lymph nodes: Examined: 3 Sentinel 0 Non-sentinel 3 Total Lymph nodes with metastasis: 1 (macrometastasis) - no extracapsular extension is identified. Breast prognostic profile: [phone] Estrogen receptor: 100%, strong staining intensity Progesterone receptor: 100%, strong staining intensity Her 2 neu: Amplification was detected. The ratio was 2.58 Ki-67: 60% TNM: pT1c, pN1a Non-neoplastic breast: No significant findings. 6. The surgical resection margin(s) of the specimen were inked and microscopically evaluated. Enid Cutter MD Pathologist, Electronic Signature (Case signed 11/29/2015)   RADIOGRAPHIC STUDIES: I have personally reviewed the radiological images as listed and agreed with the findings in the report.  Echo 01/02/2017 Impressions: - Compared to a prior study in 07/2016, the LVEF is stable however,   LV strain is worse at -15% with inferolateral strain abnormality.  Bone scan  11/13/2016 IMPRESSION: IMPRESSION: 1. No evidence for metastatic disease. 2. Lumbar spine degenerative disc disease.   CT abdomen and pelvis w contrast 11/13/2016 IMPRESSION: 1.8 cm hypervascular lesion in segment 6, unchanged. Given stability, a benign etiology such as FNH or flash filling hemangioma is favored. Metastasis is difficult to exclude but is considered less likely.  1.9 cm lesion in segment 3, compatible with a benign hemangioma.  No findings specific for metastatic disease in the abdomen. Please note that the pelvis was not imaged.   CT head w wo contrast 11/13/2016 IMPRESSION: Negative CT head with contrast.  ECHO 01/02/17  Impressions: - Compared to a prior study in 07/2016, the LVEF is stable however,   LV strain is worse at -15% with inferolateral strain abnormality.  ECHO 08/02/2016 Impressions: - Normal LV size with EF 55%. Strain as noted above. Normal RV size   and systolic function.  ASSESSMENT & PLAN:  50 y.o. African-American female, surgical postmenopausal, history of stage I left breast cancer, with newly diagnosed right breast cancer.  1. Right breast invasive ductal carcinoma, pT1cN1aM0, stage IIB, grade 2, ER100%+/PR100%+/HER2+ -I previously reviewed her surgical pathology results with patient in great details. She has 1 out of 3 sentinel lymph nodes positive, her pathological stage is more advanced than her initial clinical stage.  -We reviewed the natural history of triple positive breast cancer. HER-2 positive tumors are more progressive, with higher risk of recurrence -I reviewed her repeated CT and bone scan images from 11/13/2016 with pt in detail, which are negative for metastatic disease. She has 2 small liver  lesions, likely benign, unchanged. Unfortunately she is not able to do liver MRI, due to her breast implants.  -She has completed adjuvant chemotherapy TCHP, Herceptin and perjeta were held for the last 2 cycle due to her decreased  EF, she has completed maintenance Herceptin and perjeta in March 2018.  -Her repeated echo has showed normal EF. -She  has completed adjuvant breast and axilla radiation and tolerated well. -She previously could not tolerate letrozole and it was switched to exemestane, she tolerates it much better, still has severe hot flash, will continue for 5-7 years.  -she tried neratinib, a recently FDA approved oral her2 antibody, but could not tolerate due to side effects. She has stopped it.  -We reviewed to breast cancer surveillance. She is status post bilateral mastectomy, no need to routine mammogram. I previously encouraged her to do self exam. She'll follow-up with [place] every 3-6 months for labs and exam. -Port was removed 05/29/17 -She plans to have breast reconstruction on 12/06/17 -She has been experiencing right arm lymph edema. I suggest she follow up with her PT about changing her compression sleeve or review of certain exercises to decrease this.  -She is clinically doing well. Lab reviewed, her CBC and CMP are within normal limits. Her physical exam was unremarkable. There is no clinical concern for recurrence. -Given her bilateral mastectomy she will not need regular mammograms.  -f/u in 4 months    2. Anxiety and depression  -She reports anxiety attack, an overall depressed mood since she completed her major breast cancer treatment. -She also has some cognitive dysfunction when she is very anxious, she was seen for this a few weeks ago. -She did not feel better after counseling with our Education officer, museum and chaplain previously, but needs some medication for her anxiety attack. -I previously recommended her to consider SSRI for her depression and anxiety. She tried Effexor before, but tolerated poorly  -I previously prescribed a small prescription of Xanax, to use as needed for panic attack. 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 She agrees.  -I also  encouraged her to exercise routinely, such as yoga, and meditation. -While on Celexa she her anxiety and depression has much improved, continue Celexa  -Given her worsening hot flashes I will increase her Celexa to 52m daily (11/29/17)  3. Genetics -Due to her young age and recurrent breast cancer, she was referred to see a genetic counselor in our cancer center. -Her genetic test was negative  4. DM -she will continue follow-up with her primary care physician -She is on metformin  5. Bone health -She is postmenopausal by surgery - her bone density scan from December 2017 was normal, -  I previously reviewed the CT scan and bone density scan with her. I previously recommenced her to see a  Orthopedic surgeon.  - I previously encouraged her to take vitamin D and calcium, along with drinking more water. She is compliant   6. Insomnia and low appetite  - improved some, continue mirtazapine -previously has not been able to wean off Ambien, will continue for now -Insomnia is managed with Ambien -she has been gaining weight, over 10 pounds since last visit  7. Hot flush -Secondary to menopause and exemestane, improved, manageable- -she previously tried Effexor, didn't feel helpful. We restart on 07/13/17 at 764mfor her mood swings and anxiety -Worsened lately, will increase Celexa to 2051maily.   8. Mood swing   - I have previously  advised the patient that her exemestane could be causing her anxious and agitated moods. Patient says she can handle it for now. If it gets out of hand, she will call me. -She previously tried Effexor, did not tolerate well.  - I have previously advised the patient to exercise, she plans to try yoga or meditation -she is on Celexa now, tolerating well and mood swing has much improved    Plan for today  -Increase and prescribe Celexa 35m daily -Continue Exemestane  -Lab and f/u in 4 months  -Proceed with breast reconstruction on 12/06/17   All  questions were answered. The patient knows to call the clinic with any problems, questions or concerns.  I spent 20 minutes counseling the patient face to face. The total time spent in the appointment was 25 minutes and more than 50% was on counseling.  This document serves as a record of services personally performed by YTruitt Merle MD. It was created on her behalf by AJoslyn Devon a trained medical scribe. The creation of this record is based on the scribe's personal observations and the provider's statements to them.    I have reviewed the above documentation for accuracy and completeness, and I agree with the above.    YTruitt Merle 11/29/2017 12:50 PM

## 2017-11-29 ENCOUNTER — Other Ambulatory Visit (HOSPITAL_BASED_OUTPATIENT_CLINIC_OR_DEPARTMENT_OTHER): Payer: BLUE CROSS/BLUE SHIELD

## 2017-11-29 ENCOUNTER — Encounter: Payer: Self-pay | Admitting: Hematology

## 2017-11-29 ENCOUNTER — Ambulatory Visit (HOSPITAL_BASED_OUTPATIENT_CLINIC_OR_DEPARTMENT_OTHER): Payer: BLUE CROSS/BLUE SHIELD | Admitting: Hematology

## 2017-11-29 VITALS — BP 108/69 | HR 94 | Temp 99.8°F | Resp 18 | Ht 66.0 in | Wt 150.8 lb

## 2017-11-29 DIAGNOSIS — Z170 Estrogen receptor positive status [ER+]: Secondary | ICD-10-CM | POA: Diagnosis not present

## 2017-11-29 DIAGNOSIS — C50411 Malignant neoplasm of upper-outer quadrant of right female breast: Secondary | ICD-10-CM

## 2017-11-29 DIAGNOSIS — E119 Type 2 diabetes mellitus without complications: Secondary | ICD-10-CM

## 2017-11-29 LAB — CBC WITH DIFFERENTIAL/PLATELET
BASO%: 0.4 % (ref 0.0–2.0)
Basophils Absolute: 0 10*3/uL (ref 0.0–0.1)
EOS%: 2.1 % (ref 0.0–7.0)
Eosinophils Absolute: 0.1 10*3/uL (ref 0.0–0.5)
HCT: 37.7 % (ref 34.8–46.6)
HEMOGLOBIN: 12.8 g/dL (ref 11.6–15.9)
LYMPH%: 23.1 % (ref 14.0–49.7)
MCH: 31.2 pg (ref 25.1–34.0)
MCHC: 33.9 g/dL (ref 31.5–36.0)
MCV: 92 fL (ref 79.5–101.0)
MONO#: 0.5 10*3/uL (ref 0.1–0.9)
MONO%: 8.6 % (ref 0.0–14.0)
NEUT%: 65.8 % (ref 38.4–76.8)
NEUTROS ABS: 3.7 10*3/uL (ref 1.5–6.5)
Platelets: 285 10*3/uL (ref 145–400)
RBC: 4.1 10*6/uL (ref 3.70–5.45)
RDW: 13.2 % (ref 11.2–14.5)
WBC: 5.6 10*3/uL (ref 3.9–10.3)
lymph#: 1.3 10*3/uL (ref 0.9–3.3)

## 2017-11-29 LAB — COMPREHENSIVE METABOLIC PANEL
ALT: 10 U/L (ref 0–55)
ANION GAP: 10 meq/L (ref 3–11)
AST: 13 U/L (ref 5–34)
Albumin: 4.2 g/dL (ref 3.5–5.0)
Alkaline Phosphatase: 83 U/L (ref 40–150)
BILIRUBIN TOTAL: 0.56 mg/dL (ref 0.20–1.20)
BUN: 8.9 mg/dL (ref 7.0–26.0)
CALCIUM: 9.5 mg/dL (ref 8.4–10.4)
CO2: 25 mEq/L (ref 22–29)
CREATININE: 0.8 mg/dL (ref 0.6–1.1)
Chloride: 105 mEq/L (ref 98–109)
EGFR: >60 mL/min/{1.73_m2} (ref >60)
Glucose: 102 mg/dl (ref 70–140)
Potassium: 3.6 mEq/L (ref 3.5–5.1)
Sodium: 140 mEq/L (ref 136–145)
TOTAL PROTEIN: 7.7 g/dL (ref 6.4–8.3)

## 2017-11-29 MED ORDER — CITALOPRAM HYDROBROMIDE 20 MG PO TABS: 20.0000 mg | ORAL_TABLET | Freq: Every day | ORAL | 2 refills | Status: DC

## 2017-11-29 MED FILL — CITALOPRAM HBR 20 MG TABLET: 20 | 30 days supply | Qty: 30 | Fill #0

## 2017-11-30 ENCOUNTER — Ambulatory Visit: Payer: Self-pay | Admitting: Plastic Surgery

## 2017-11-30 ENCOUNTER — Telehealth: Payer: Self-pay | Admitting: Hematology

## 2017-11-30 ENCOUNTER — Encounter (HOSPITAL_BASED_OUTPATIENT_CLINIC_OR_DEPARTMENT_OTHER): Payer: Self-pay | Admitting: *Deleted

## 2017-11-30 DIAGNOSIS — Z9013 Acquired absence of bilateral breasts and nipples: Secondary | ICD-10-CM

## 2017-11-30 NOTE — Telephone Encounter (Signed)
Scheduled appts per 12/20 los - sending confirmation letter in the mail with appts.

## 2017-12-06 ENCOUNTER — Ambulatory Visit: Payer: Self-pay | Admitting: Plastic Surgery

## 2017-12-06 ENCOUNTER — Ambulatory Visit (HOSPITAL_BASED_OUTPATIENT_CLINIC_OR_DEPARTMENT_OTHER): Payer: BLUE CROSS/BLUE SHIELD | Admitting: Certified Registered Nurse Anesthetist

## 2017-12-06 ENCOUNTER — Other Ambulatory Visit: Payer: Self-pay

## 2017-12-06 ENCOUNTER — Encounter (HOSPITAL_BASED_OUTPATIENT_CLINIC_OR_DEPARTMENT_OTHER): Payer: Self-pay

## 2017-12-06 ENCOUNTER — Encounter (HOSPITAL_BASED_OUTPATIENT_CLINIC_OR_DEPARTMENT_OTHER)
Admission: RE | Disposition: A | Discharge status: Home / Self Care | Payer: Self-pay | Source: Ambulatory Visit | Attending: Plastic Surgery

## 2017-12-06 ENCOUNTER — Ambulatory Visit (HOSPITAL_BASED_OUTPATIENT_CLINIC_OR_DEPARTMENT_OTHER)
Admission: RE | Admit: 2017-12-06 | Discharge: 2017-12-06 | Disposition: A | Discharge status: Home / Self Care | Payer: BLUE CROSS/BLUE SHIELD | Source: Ambulatory Visit | Attending: Plastic Surgery | Admitting: Plastic Surgery

## 2017-12-06 DIAGNOSIS — F419 Anxiety disorder, unspecified: Secondary | ICD-10-CM | POA: Diagnosis not present

## 2017-12-06 DIAGNOSIS — Z885 Allergy status to narcotic agent status: Secondary | ICD-10-CM | POA: Insufficient documentation

## 2017-12-06 DIAGNOSIS — Z888 Allergy status to other drugs, medicaments and biological substances status: Secondary | ICD-10-CM | POA: Insufficient documentation

## 2017-12-06 DIAGNOSIS — Z79899 Other long term (current) drug therapy: Secondary | ICD-10-CM | POA: Diagnosis not present

## 2017-12-06 DIAGNOSIS — F329 Major depressive disorder, single episode, unspecified: Secondary | ICD-10-CM | POA: Insufficient documentation

## 2017-12-06 DIAGNOSIS — Z9013 Acquired absence of bilateral breasts and nipples: Secondary | ICD-10-CM | POA: Diagnosis not present

## 2017-12-06 DIAGNOSIS — Z853 Personal history of malignant neoplasm of breast: Secondary | ICD-10-CM | POA: Diagnosis present

## 2017-12-06 DIAGNOSIS — N6032 Fibrosclerosis of left breast: Secondary | ICD-10-CM | POA: Diagnosis not present

## 2017-12-06 DIAGNOSIS — L905 Scar conditions and fibrosis of skin: Secondary | ICD-10-CM | POA: Insufficient documentation

## 2017-12-06 HISTORY — PX: REMOVAL OF BILATERAL TISSUE EXPANDERS WITH PLACEMENT OF BILATERAL BREAST IMPLANTS: SHX6431

## 2017-12-06 SURGERY — REMOVAL, TISSUE EXPANDER, BREAST, BILATERAL, WITH BILATERAL IMPLANT IMPLANT INSERTION
Anesthesia: General | Site: Breast | Laterality: Bilateral

## 2017-12-06 MED ORDER — CEFAZOLIN SODIUM-DEXTROSE 2-4 GM/100ML-% IV SOLN
2.0000 g | INTRAVENOUS | Status: AC
  Administered 2017-12-06: 2 g via INTRAVENOUS

## 2017-12-06 MED ORDER — DIPHENHYDRAMINE HCL 50 MG/ML IJ SOLN
12.5000 mg | Freq: Once | INTRAMUSCULAR | Status: AC
  Administered 2017-12-06: 12.5 mg via INTRAVENOUS

## 2017-12-06 MED ORDER — LIDOCAINE 2% (20 MG/ML) 5 ML SYRINGE
INTRAMUSCULAR | Status: DC | PRN
  Administered 2017-12-06: 60 mg via INTRAVENOUS

## 2017-12-06 MED ORDER — MIDAZOLAM HCL 5 MG/5ML IJ SOLN
INTRAMUSCULAR | Status: DC | PRN
  Administered 2017-12-06: 2 mg via INTRAVENOUS

## 2017-12-06 MED ORDER — HYDROMORPHONE HCL 1 MG/ML IJ SOLN
INTRAMUSCULAR | Status: AC
  Filled 2017-12-06: qty 0.5

## 2017-12-06 MED ORDER — SODIUM CHLORIDE 0.9% FLUSH: 3.0000 mL | Freq: Two times a day (BID) | INTRAVENOUS | Status: DC

## 2017-12-06 MED ORDER — MIDAZOLAM HCL 2 MG/2ML IJ SOLN
INTRAMUSCULAR | Status: AC
  Filled 2017-12-06: qty 2

## 2017-12-06 MED ORDER — ONDANSETRON HCL 4 MG/2ML IJ SOLN
INTRAMUSCULAR | Status: DC | PRN
  Administered 2017-12-06: 4 mg via INTRAVENOUS

## 2017-12-06 MED ORDER — SODIUM CHLORIDE 0.9% FLUSH: 3.0000 mL | INTRAVENOUS | Status: DC | PRN

## 2017-12-06 MED ORDER — EPHEDRINE SULFATE 50 MG/ML IJ SOLN
INTRAMUSCULAR | Status: AC
  Filled 2017-12-06: qty 1

## 2017-12-06 MED ORDER — LIDOCAINE-EPINEPHRINE 1 %-1:100000 IJ SOLN
INTRAMUSCULAR | Status: AC
  Filled 2017-12-06: qty 3

## 2017-12-06 MED ORDER — SCOPOLAMINE 1 MG/3DAYS TD PT72: 1.0000 | MEDICATED_PATCH | Freq: Once | TRANSDERMAL | Status: DC | PRN

## 2017-12-06 MED ORDER — FENTANYL CITRATE (PF) 100 MCG/2ML IJ SOLN
INTRAMUSCULAR | Status: DC | PRN
  Administered 2017-12-06: 50 ug via INTRAVENOUS
  Administered 2017-12-06 (×4): 25 ug via INTRAVENOUS

## 2017-12-06 MED ORDER — PROPOFOL 10 MG/ML IV BOLUS
INTRAVENOUS | Status: DC | PRN
  Administered 2017-12-06: 200 mg via INTRAVENOUS

## 2017-12-06 MED ORDER — CEFAZOLIN SODIUM-DEXTROSE 2-4 GM/100ML-% IV SOLN
INTRAVENOUS | Status: AC
  Filled 2017-12-06: qty 100

## 2017-12-06 MED ORDER — METOPROLOL TARTRATE 5 MG/5ML IV SOLN
INTRAVENOUS | Status: AC
  Filled 2017-12-06: qty 5

## 2017-12-06 MED ORDER — FENTANYL CITRATE (PF) 100 MCG/2ML IJ SOLN: 50.0000 ug | INTRAMUSCULAR | Status: DC | PRN

## 2017-12-06 MED ORDER — SODIUM CHLORIDE 0.9 % IV SOLN
INTRAVENOUS | Status: DC | PRN
  Administered 2017-12-06: 500 mL

## 2017-12-06 MED ORDER — FENTANYL CITRATE (PF) 100 MCG/2ML IJ SOLN
INTRAMUSCULAR | Status: AC
  Filled 2017-12-06: qty 2

## 2017-12-06 MED ORDER — DEXAMETHASONE SODIUM PHOSPHATE 10 MG/ML IJ SOLN
INTRAMUSCULAR | Status: DC | PRN
  Administered 2017-12-06: 10 mg via INTRAVENOUS

## 2017-12-06 MED ORDER — DIPHENHYDRAMINE HCL 50 MG/ML IJ SOLN
INTRAMUSCULAR | Status: AC
  Filled 2017-12-06: qty 1

## 2017-12-06 MED ORDER — KETOROLAC TROMETHAMINE 30 MG/ML IJ SOLN
INTRAMUSCULAR | Status: AC
Start: 2017-12-06 — End: 2017-12-06
  Filled 2017-12-06: qty 1

## 2017-12-06 MED ORDER — PROPOFOL 10 MG/ML IV BOLUS
INTRAVENOUS | Status: AC
  Filled 2017-12-06: qty 40

## 2017-12-06 MED ORDER — KETOROLAC TROMETHAMINE 30 MG/ML IJ SOLN
30.0000 mg | Freq: Once | INTRAMUSCULAR | Status: AC
  Administered 2017-12-06: 30 mg via INTRAVENOUS

## 2017-12-06 MED ORDER — BUPIVACAINE-EPINEPHRINE (PF) 0.25% -1:200000 IJ SOLN
INTRAMUSCULAR | Status: AC
  Filled 2017-12-06: qty 120

## 2017-12-06 MED ORDER — MIDAZOLAM HCL 2 MG/2ML IJ SOLN: 1.0000 mg | INTRAMUSCULAR | Status: DC | PRN

## 2017-12-06 MED ORDER — HYDROMORPHONE HCL 1 MG/ML IJ SOLN
0.2500 mg | INTRAMUSCULAR | Status: DC | PRN
  Administered 2017-12-06 (×2): 0.5 mg via INTRAVENOUS

## 2017-12-06 MED ORDER — PROMETHAZINE HCL 25 MG/ML IJ SOLN: 6.2500 mg | INTRAMUSCULAR | Status: DC | PRN

## 2017-12-06 MED ORDER — LIDOCAINE 2% (20 MG/ML) 5 ML SYRINGE
INTRAMUSCULAR | Status: AC
  Filled 2017-12-06: qty 5

## 2017-12-06 MED ORDER — EPHEDRINE SULFATE 50 MG/ML IJ SOLN
INTRAMUSCULAR | Status: DC | PRN
  Administered 2017-12-06 (×5): 10 mg via INTRAVENOUS

## 2017-12-06 MED ORDER — 0.9 % SODIUM CHLORIDE (POUR BTL) OPTIME
TOPICAL | Status: DC | PRN
  Administered 2017-12-06: 1000 mL

## 2017-12-06 MED ORDER — LACTATED RINGERS IV SOLN
INTRAVENOUS | Status: DC
  Administered 2017-12-06 (×2): via INTRAVENOUS

## 2017-12-06 MED ORDER — CEFAZOLIN SODIUM-DEXTROSE 2-4 GM/100ML-% IV SOLN: 2.0000 g | INTRAVENOUS | Status: DC

## 2017-12-06 MED ORDER — SODIUM CHLORIDE 0.9 % IV SOLN: 250.0000 mL | INTRAVENOUS | Status: DC | PRN

## 2017-12-06 MED ORDER — PHENYLEPHRINE 40 MCG/ML (10ML) SYRINGE FOR IV PUSH (FOR BLOOD PRESSURE SUPPORT)
PREFILLED_SYRINGE | INTRAVENOUS | Status: DC | PRN
  Administered 2017-12-06 (×3): 80 ug via INTRAVENOUS

## 2017-12-06 MED ORDER — ONDANSETRON HCL 4 MG/2ML IJ SOLN
INTRAMUSCULAR | Status: AC
  Filled 2017-12-06: qty 2

## 2017-12-06 MED ORDER — PHENYLEPHRINE 40 MCG/ML (10ML) SYRINGE FOR IV PUSH (FOR BLOOD PRESSURE SUPPORT)
PREFILLED_SYRINGE | INTRAVENOUS | Status: AC
  Filled 2017-12-06: qty 10

## 2017-12-06 MED ORDER — DEXAMETHASONE SODIUM PHOSPHATE 10 MG/ML IJ SOLN
INTRAMUSCULAR | Status: AC
  Filled 2017-12-06: qty 1

## 2017-12-06 MED ORDER — MEPERIDINE HCL 25 MG/ML IJ SOLN: 6.2500 mg | INTRAMUSCULAR | Status: DC | PRN

## 2017-12-06 MED ORDER — BUPIVACAINE-EPINEPHRINE 0.25% -1:200000 IJ SOLN
INTRAMUSCULAR | Status: DC | PRN
  Administered 2017-12-06: 10 mL

## 2017-12-06 SURGICAL SUPPLY — 69 items
BAG DECANTER FOR FLEXI CONT (MISCELLANEOUS) ×3 IMPLANT
BINDER BREAST LRG (GAUZE/BANDAGES/DRESSINGS) IMPLANT
BINDER BREAST MEDIUM (GAUZE/BANDAGES/DRESSINGS) ×3 IMPLANT
BINDER BREAST XLRG (GAUZE/BANDAGES/DRESSINGS) IMPLANT
BINDER BREAST XXLRG (GAUZE/BANDAGES/DRESSINGS) IMPLANT
BIOPATCH RED 1 DISK 7.0 (GAUZE/BANDAGES/DRESSINGS) IMPLANT
BIOPATCH RED 1IN DISK 7.0MM (GAUZE/BANDAGES/DRESSINGS)
BLADE HEX COATED 2.75 (ELECTRODE) ×3 IMPLANT
BLADE SURG 15 STRL LF DISP TIS (BLADE) ×2 IMPLANT
BLADE SURG 15 STRL SS (BLADE) ×4
BNDG GAUZE ELAST 4 BULKY (GAUZE/BANDAGES/DRESSINGS) IMPLANT
CANISTER SUCT 1200ML W/VALVE (MISCELLANEOUS) ×3 IMPLANT
CHLORAPREP W/TINT 26ML (MISCELLANEOUS) ×3 IMPLANT
CORD BIPOLAR FORCEPS 12FT (ELECTRODE) IMPLANT
COVER BACK TABLE 60X90IN (DRAPES) ×3 IMPLANT
COVER MAYO STAND STRL (DRAPES) ×3 IMPLANT
DECANTER SPIKE VIAL GLASS SM (MISCELLANEOUS) IMPLANT
DERMABOND ADVANCED (GAUZE/BANDAGES/DRESSINGS)
DERMABOND ADVANCED .7 DNX12 (GAUZE/BANDAGES/DRESSINGS) IMPLANT
DRAIN CHANNEL 19F RND (DRAIN) IMPLANT
DRAPE LAPAROSCOPIC ABDOMINAL (DRAPES) ×3 IMPLANT
DRSG PAD ABDOMINAL 8X10 ST (GAUZE/BANDAGES/DRESSINGS) ×6 IMPLANT
ELECT BLADE 4.0 EZ CLEAN MEGAD (MISCELLANEOUS) ×3
ELECT REM PT RETURN 9FT ADLT (ELECTROSURGICAL) ×3
ELECTRODE BLDE 4.0 EZ CLN MEGD (MISCELLANEOUS) ×1 IMPLANT
ELECTRODE REM PT RTRN 9FT ADLT (ELECTROSURGICAL) ×1 IMPLANT
EVACUATOR SILICONE 100CC (DRAIN) IMPLANT
GAUZE SPONGE 4X4 12PLY STRL LF (GAUZE/BANDAGES/DRESSINGS) IMPLANT
GLOVE BIO SURGEON STRL SZ 6.5 (GLOVE) ×4 IMPLANT
GLOVE BIO SURGEON STRL SZ7 (GLOVE) ×3 IMPLANT
GLOVE BIO SURGEONS STRL SZ 6.5 (GLOVE) ×2
GOWN STRL REUS W/ TWL LRG LVL3 (GOWN DISPOSABLE) ×1 IMPLANT
GOWN STRL REUS W/ TWL XL LVL3 (GOWN DISPOSABLE) ×1 IMPLANT
GOWN STRL REUS W/TWL LRG LVL3 (GOWN DISPOSABLE) ×2
GOWN STRL REUS W/TWL XL LVL3 (GOWN DISPOSABLE) ×2
IMPL GEL 300CC (Breast) ×2 IMPLANT
IMPLANT GEL 300CC (Breast) ×6 IMPLANT
IV NS 1000ML (IV SOLUTION)
IV NS 1000ML BAXH (IV SOLUTION) IMPLANT
IV NS 500ML (IV SOLUTION)
IV NS 500ML BAXH (IV SOLUTION) IMPLANT
KIT FILL SYSTEM UNIVERSAL (SET/KITS/TRAYS/PACK) IMPLANT
NDL SAFETY ECLIPSE 18X1.5 (NEEDLE) ×1 IMPLANT
NEEDLE HYPO 18GX1.5 SHARP (NEEDLE) ×2
NEEDLE HYPO 25X1 1.5 SAFETY (NEEDLE) ×3 IMPLANT
PACK BASIN DAY SURGERY FS (CUSTOM PROCEDURE TRAY) ×3 IMPLANT
PENCIL BUTTON HOLSTER BLD 10FT (ELECTRODE) ×3 IMPLANT
PIN SAFETY STERILE (MISCELLANEOUS) IMPLANT
SIZER BREAST GEL REUSE 300CC (SIZER) ×3
SIZER BREAST REUSE 275CC (SIZER) ×3
SIZER BRST GEL REUSE 300CC (SIZER) ×1 IMPLANT
SIZER BRST REUSE 275CC (SIZER) ×1 IMPLANT
SLEEVE SCD COMPRESS KNEE MED (MISCELLANEOUS) ×3 IMPLANT
SPONGE LAP 18X18 X RAY DECT (DISPOSABLE) ×6 IMPLANT
SUT MNCRL AB 4-0 PS2 18 (SUTURE) ×6 IMPLANT
SUT MON AB 3-0 SH 27 (SUTURE) ×4
SUT MON AB 3-0 SH27 (SUTURE) ×2 IMPLANT
SUT MON AB 5-0 PS2 18 (SUTURE) ×6 IMPLANT
SUT PDS AB 2-0 CT2 27 (SUTURE) IMPLANT
SUT VIC AB 3-0 SH 27 (SUTURE)
SUT VIC AB 3-0 SH 27X BRD (SUTURE) IMPLANT
SUT VICRYL 4-0 PS2 18IN ABS (SUTURE) IMPLANT
SYR BULB IRRIGATION 50ML (SYRINGE) ×3 IMPLANT
SYR CONTROL 10ML LL (SYRINGE) IMPLANT
TOWEL OR 17X24 6PK STRL BLUE (TOWEL DISPOSABLE) ×6 IMPLANT
TUBE CONNECTING 20'X1/4 (TUBING) ×1
TUBE CONNECTING 20X1/4 (TUBING) ×2 IMPLANT
UNDERPAD 30X30 (UNDERPADS AND DIAPERS) ×6 IMPLANT
YANKAUER SUCT BULB TIP NO VENT (SUCTIONS) ×3 IMPLANT

## 2017-12-06 NOTE — Anesthesia Postprocedure Evaluation (Signed)
Anesthesia Post Note  Patient: Sandra James  Procedure(s) Performed: REMOVAL OF BILATERAL TISSUE EXPANDERS WITH PLACEMENT OF BILATERAL SILCONE  BREAST IMPLANTS (Bilateral Breast)     Patient location during evaluation: PACU Anesthesia Type: General Level of consciousness: awake and alert Pain management: pain level controlled Vital Signs Assessment: post-procedure vital signs reviewed and stable Respiratory status: spontaneous breathing, nonlabored ventilation and respiratory function stable Cardiovascular status: blood pressure returned to baseline and stable Postop Assessment: no apparent nausea or vomiting Anesthetic complications: no    Last Vitals:  Vitals:   12/06/17 1015 12/06/17 1030  BP: 115/77 116/79  Pulse: 97 (!) 116  Resp: 20 (!) 24  Temp:    SpO2: 100% 98%    Last Pain:  Vitals:   12/06/17 1030  TempSrc:   PainSc: [place]

## 2017-12-06 NOTE — Discharge Instructions (Addendum)
May shower tomorrow. No pressure to the chest area. No heavy lifting. Continue binder or sports bra.   Do NOT take Ibuprofen/Motrin until 4:30pm tonight 12/06/17.   Post Anesthesia Home Care Instructions  Activity: Get plenty of rest for the remainder of the day. A responsible individual must stay with you for 24 hours following the procedure.  For the next 24 hours, DO NOT: -Drive a car -Paediatric nurse -Drink alcoholic beverages -Take any medication unless instructed by your physician -Make any legal decisions or sign important papers.  Meals: Start with liquid foods such as gelatin or soup. Progress to regular foods as tolerated. Avoid greasy, spicy, heavy foods. If nausea and/or vomiting occur, drink only clear liquids until the nausea and/or vomiting subsides. Call your physician if vomiting continues.  Special Instructions/Symptoms: Your throat may feel dry or sore from the anesthesia or the breathing tube placed in your throat during surgery. If this causes discomfort, gargle with warm salt water. The discomfort should disappear within 24 hours.  If you had a scopolamine patch placed behind your ear for the management of post- operative nausea and/or vomiting:  1. The medication in the patch is effective for 72 hours, after which it should be removed.  Wrap patch in a tissue and discard in the trash. Wash hands thoroughly with soap and water. 2. You may remove the patch earlier than 72 hours if you experience unpleasant side effects which may include dry mouth, dizziness or visual disturbances. 3. Avoid touching the patch. Wash your hands with soap and water after contact with the patch.

## 2017-12-06 NOTE — H&P (Signed)
Sandra James is an 51 y.o. female.   Chief Complaint: acquired absence of breasts HPI: The patient is a 51 y.o. yrs old bf here for history and physical for breast reconstruction secondary procedure.  She is pleased with the size and understands the limitation due to her size and radiation history.  She has 310 cc on the right and 300 cc on the left.   Her skin is dry but otherwise looks good and no sign of infection.  History: She underwent a screening mammogram and was found to have suspicious right breast calcifications. The ultrasound showed 0.8 cm mass at 11-12 o'clock position. 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 She had BRCA testing in 2011 and was told it was negative. She had an oophorectomy in 2011. She is seeing Drs. Barry Dienes and [place]. She is 5 feet 6 inches, weighs 161 pounds. Preop bra= 36C. In 2004 she had left breast cancer and was treated with a lumpectomy and radiation.   Past Medical History:  Diagnosis Date  . Allergy   . Anxiety   . Breast cancer ([place]) 09/07/15   right breast  . Breast cancer of upper-outer quadrant of right female breast ([place]) 09/09/2015  . Cancer [facility])    left brast lumpectomy   . Complication of anesthesia    slow to wake up- BP was low, fainted next day-  . Depression   . Fibroid   . GERD (gastroesophageal reflux disease)   . Infection    UTI  . MVP (mitral valve prolapse)   . Neuromuscular disorder ([place])    sciatic nerve paion left side/leg  . UTI (lower urinary tract infection)     Past Surgical History:  Procedure Laterality Date  . ABDOMINAL HYSTERECTOMY    . BREAST RECONSTRUCTION WITH PLACEMENT OF TISSUE EXPANDER AND FLEX HD (ACELLULAR HYDRATED DERMIS) Bilateral 11/25/2015   Procedure: BILATERAL IMMEDIATE BREAST RECONSTRUCTION WITH PLACEMENT OF TISSUE EXPANDERS AND FLEX HD (ACELLULAR HYDRATED DERMIS);  Surgeon: Wallace Going, DO;  Location: [place];  Service: Plastics;  Laterality: Bilateral;  . BREAST SURGERY  2004   left lumpectomy  . MASTECTOMY W/ SENTINEL NODE BIOPSY Bilateral 11/25/2015   Procedure: BILATERAL SKIN SPARING MASTECTOMIES WITH RIGHT SENTINEL LYMPH NODE BIOPSY;  Surgeon: Stark Klein, MD;  Location: [place];  Service: General;  Laterality: Bilateral;  . OOPHORECTOMY Bilateral   . PORT-A-CATH REMOVAL Left 05/29/2017   Procedure: PORT REMOVAL;  Surgeon: Stark Klein, MD;  Location: [place];  Service: General;  Laterality: Left;  . PORTACATH PLACEMENT Left 12/20/2015   Procedure: INSERTION PORT-A-CATH, left chest;  Surgeon: Stark Klein, MD;  Location: [facility];  Service: General;  Laterality: Left;    Family History  Problem Relation Age of Onset  . Hypertension Mother   . Stroke Mother   . Alzheimer's disease Mother   . Heart disease Father   . Stroke Father   . Heart attack Father   . Breast cancer Sister 79       double mastectomy  . Other Sister        "stomach tumor that wrapped around reproductive organs"; dx. 3s; required partial hysterectomy  . Alzheimer's disease Maternal Grandmother   . Alzheimer's disease Paternal Grandmother   . Other Other        had to have lymph nodes removed and radiation; dx. 46s  . Cancer Maternal Aunt  unspecified type; dx. <50  . Colon cancer Maternal Uncle        dx. 30s  . Breast cancer Cousin        dx. 32s   Social History:  reports that  has never smoked. she has never used smokeless tobacco. She reports that she drinks about 2.4 oz of alcohol per week. She reports that she does not use drugs.  Allergies:  Allergies  Allergen Reactions  . Lisinopril Cough  . Oxycodone Itching  . Percocet [Oxycodone-Acetaminophen] Itching    Medications Prior to Admission  Medication Sig Dispense Refill  . Calcium Carbonate-Vitamin D (CALTRATE 600+D) 600-400 MG-UNIT tablet Take 1 tablet by mouth daily.    . cholecalciferol  (VITAMIN D) 1000 units tablet Take 1,000 Units by mouth daily.    . citalopram (CELEXA) 20 MG tablet Take 1 tablet (20 mg total) by mouth daily. 30 tablet 2  . exemestane (AROMASIN) 25 MG tablet TAKE 1 TABLET BY MOUTH DAILY AFTER BREAKFAST. 30 tablet 2  . HYDROcodone-acetaminophen (NORCO/VICODIN) 5-325 MG tablet Take 1 tablet by mouth every 6 (six) hours as needed for moderate pain.    Marland Kitchen ibuprofen (ADVIL,MOTRIN) 800 MG tablet Take 800 mg by mouth daily as needed for headache or moderate pain.    Marland Kitchen PRENATAL VIT-FE-FA-CA-DSS-DHA PO Take by mouth.    . zolpidem (AMBIEN CR) 12.5 MG CR tablet Take 1 tablet (12.5 mg total) by mouth at bedtime as needed for sleep. 30 tablet 2    No results found for this or any previous visit (from the past 48 hour(s)). No results found.  Review of Systems  Constitutional: Negative.   HENT: Negative.   Eyes: Negative.   Respiratory: Negative.   Cardiovascular: Negative.   Gastrointestinal: Negative.   Genitourinary: Negative.   Musculoskeletal: Negative.   Skin: Negative.   Neurological: Negative.   Psychiatric/Behavioral: Negative.     Blood pressure 101/70, pulse 75, temperature 98.3 F (36.8 C), temperature source Oral, resp. rate 18, height 5' 6"  (1.676 m), weight 65.9 kg (145 lb 6 oz), SpO2 98 %. Physical Exam  Constitutional: She is oriented to person, place, and time. She appears well-developed and well-nourished.  HENT:  Head: Normocephalic and atraumatic.  Eyes: EOM are normal. Pupils are equal, round, and reactive to light.  Cardiovascular: Normal rate.  Respiratory: Effort normal.  GI: Soft.  Musculoskeletal: She exhibits no edema.  Neurological: She is alert and oriented to person, place, and time.  Skin: Skin is warm.  Psychiatric: She has a normal mood and affect. Her behavior is normal. Judgment and thought content normal.     Assessment/Plan  Plan for removal of bilateral expanders and placement of implants.  The risks that can be  encountered with and after placement of a breast prosthetics were discussed and include the following but not limited to these: bleeding, infection, delayed healing, anesthesia risks, skin sensation changes, injury to structures including nerves, blood vessels, and muscles which may be temporary or permanent, allergies to tape, suture materials and glues, blood products, topical preparations or injected agents, skin contour irregularities, skin discoloration and swelling, deep vein thrombosis, cardiac and pulmonary complications, pain, which may persist, fluid accumulation, wrinkling of the skin over the expander, changes in nipple or breast sensation, expander leakage or rupture, faulty position of the expander, persistent pain, formation of tight scar tissue around the expander (capsular contracture), possible need for revisional surgery or staged procedures.  [place], DO 12/06/2017, 6:57 AM

## 2017-12-06 NOTE — Anesthesia Preprocedure Evaluation (Addendum)
Anesthesia Evaluation  Patient identified by MRN, date of birth, ID band Patient awake    Reviewed: Allergy & Precautions, NPO status , Patient's Chart, lab work & pertinent test results  Airway Mallampati: II       Dental  (+) Teeth Intact   Pulmonary neg pulmonary ROS,    Pulmonary exam normal        Cardiovascular negative cardio ROS  + Valvular Problems/Murmurs MVP  Rhythm:Regular Rate:Normal     Neuro/Psych  Headaches, PSYCHIATRIC DISORDERS Anxiety Depression  Neuromuscular disease    GI/Hepatic Neg liver ROS, GERD  ,  Endo/Other    Renal/GU negative Renal ROS  negative genitourinary   Musculoskeletal negative musculoskeletal ROS (+)   Abdominal   Peds negative pediatric ROS (+)  Hematology negative hematology ROS (+)   Anesthesia Other Findings Day of surgery medications reviewed with the patient.  Reproductive/Obstetrics negative OB ROS                             Anesthesia Physical  Anesthesia Plan  ASA: II  Anesthesia Plan: General   Post-op Pain Management:    Induction: Intravenous  PONV Risk Score and Plan: 3 and Ondansetron, Dexamethasone and Midazolam  Airway Management Planned: Oral ETT and LMA  Additional Equipment:   Intra-op Plan:   Post-operative Plan: Extubation in OR  Informed Consent: I have reviewed the patients History and Physical, chart, labs and discussed the procedure including the risks, benefits and alternatives for the proposed anesthesia with the patient or authorized representative who has indicated his/her understanding and acceptance.   Dental advisory given  Plan Discussed with: CRNA  Anesthesia Plan Comments:        Anesthesia Quick Evaluation

## 2017-12-06 NOTE — Anesthesia Procedure Notes (Signed)
Procedure Name: LMA Insertion Date/Time: 12/06/2017 7:31 AM Performed by: Genelle Bal, CRNA Pre-anesthesia Checklist: Patient identified, Emergency Drugs available, Suction available and Patient being monitored Patient Re-evaluated:Patient Re-evaluated prior to induction Oxygen Delivery Method: Circle system utilized Preoxygenation: Pre-oxygenation with 100% oxygen Induction Type: IV induction Ventilation: Mask ventilation without difficulty LMA: LMA inserted LMA Size: 4.0 Number of attempts: 1 Airway Equipment and Method: Bite block Placement Confirmation: positive ETCO2 Tube secured with: Tape Dental Injury: Teeth and Oropharynx as per pre-operative assessment

## 2017-12-06 NOTE — Op Note (Signed)
Op report Bilateral Exchange   DATE OF OPERATION: 12/06/2017  LOCATION: [place]  SURGICAL DIVISION: Plastic Surgery  PREOPERATIVE DIAGNOSES:  1. History of breast cancer.  2. Acquired absence of bilateral breast.   POSTOPERATIVE DIAGNOSES:  1. History of breast cancer.  2. Acquired absence of bilateral breast.   PROCEDURE:  1. Bilateral exchange of tissue expanders for implants.  2. Bilateral capsulotomies for implant respositioning.  SURGEON: Claire Sanger Dillingham, DO  ANESTHESIA:  General.   COMPLICATIONS: None.   IMPLANTS: Left - Mentor Smooth Round High Profile Gel 300cc. Ref #[id number].  Serial Number [id number] Right - Mentor Smooth Round High Profile Gel 300cc. Ref #[id number].  Serial Number [id number]  INDICATIONS FOR PROCEDURE:  The patient, Sandra James, is a 51 y.o. female born on 13-Oct-1966, is here for treatment after bilateral mastectomies.  She had tissue expanders placed at the time of mastectomies. She now presents for exchange of her expanders for implants.  She requires capsulotomies to better position the implants. MRN: [id number]  CONSENT:  Informed consent was obtained directly from the patient. Risks, benefits and alternatives were fully discussed. Specific risks including but not limited to bleeding, infection, hematoma, seroma, scarring, pain, implant infection, implant extrusion, capsular contracture, asymmetry, wound healing problems, and need for further surgery were all discussed. The patient did have an ample opportunity to have her questions answered to her satisfaction.   DESCRIPTION OF PROCEDURE:  The patient was taken to the operating room. SCDs were placed and IV antibiotics were given. The patient's chest was prepped and draped in a sterile fashion. A time out was performed and the implants to be used were identified.    On the right breast: One percent Lidocaine with epinephrine was used to infiltrate at the  incision site. The old mastectomy scar was excised.  The mastectomy flaps from the superior and inferior flaps were raised over the pectoralis major muscle for several centimeters to minimize tension for the closure. The pectoralis was split inferior to the skin incision to expose and remove the tissue expander.  Inspection of the pocket showed a normal healthy capsule and good integration of the biologic matrix.  The pocket was irrigated with antibiotic solution.  Circumferential capsulotomies were performed to allow for breast pocket expansion.  Measurements were made and a sizer used to confirm adequate pocket size for the implant dimensions.  Hemostasis was ensured with electrocautery. New gloves were placed. The implant was soaked in antibiotic solution and then placed in the pocket and oriented appropriately. The pectoralis major muscle and capsule on the anterior surface were re-closed with a 3-0 Monocryl suture. The remaining skin was closed with 4-0 Monocryl deep dermal and 5-0 Monocryl subcuticular stitches.   On the left breast: The old mastectomy scar was excised.  The mastectomy flaps from the superior and inferior flaps were raised over the pectoralis major muscle for several centimeters to minimize tension for the closure. The pectoralis was split inferior to the skin incision to expose and remove the tissue expander.  Inspection of the pocket showed a normal healthy capsule and good integration of the biologic matrix. The flaps were incredible thin with little to no fat.  Circumferential capsulotomies were performed to allow for breast pocket expansion.  Measurements were made and a sizer utilized to confirm adequate pocket size for the implant dimensions.  Hemostasis was ensured with the electrocautery.  New gloves were applied. The implant was soaked in antibiotic solution and placed in the pocket  and oriented appropriately. The pectoralis major muscle and capsule on the anterior surface were  re-closed with a 3-0 Monocryl suture. The remaining skin was closed with 4-0 Monocryl deep dermal and 5-0 Monocryl subcuticular stitches.  Dermabond was applied to the incision site. A breast binder and ABDs were placed.  The patient was allowed to wake from anesthesia and taken to the recovery room in satisfactory condition.

## 2017-12-06 NOTE — Transfer of Care (Signed)
Immediate Anesthesia Transfer of Care Note  Patient: Sandra James  Procedure(s) Performed: REMOVAL OF BILATERAL TISSUE EXPANDERS WITH PLACEMENT OF BILATERAL SILCONE  BREAST IMPLANTS (Bilateral Breast)  Patient Location: PACU  Anesthesia Type:General  Level of Consciousness: awake, sedated and patient cooperative  Airway & Oxygen Therapy: Patient Spontanous Breathing and Patient connected to face mask oxygen  Post-op Assessment: Report given to RN and Post -op Vital signs reviewed and stable  Post vital signs: Reviewed and stable  Last Vitals:  Vitals:   12/06/17 0649  BP: 101/70  Pulse: 75  Resp: 18  Temp: 36.8 C  SpO2: 98%    Last Pain:  Vitals:   12/06/17 0649  TempSrc: Oral         Complications: No apparent anesthesia complications

## 2017-12-07 ENCOUNTER — Encounter (HOSPITAL_BASED_OUTPATIENT_CLINIC_OR_DEPARTMENT_OTHER): Payer: Self-pay | Admitting: Plastic Surgery

## 2017-12-08 ENCOUNTER — Other Ambulatory Visit: Payer: Self-pay

## 2017-12-08 ENCOUNTER — Encounter (HOSPITAL_COMMUNITY): Payer: Self-pay | Admitting: Emergency Medicine

## 2017-12-08 ENCOUNTER — Emergency Department (HOSPITAL_COMMUNITY)
Admission: EM | Admit: 2017-12-08 | Discharge: 2017-12-08 | Disposition: A | Discharge status: Home / Self Care | Payer: BLUE CROSS/BLUE SHIELD | Attending: Emergency Medicine | Admitting: Emergency Medicine

## 2017-12-08 DIAGNOSIS — I491 Atrial premature depolarization: Secondary | ICD-10-CM | POA: Diagnosis not present

## 2017-12-08 DIAGNOSIS — Z9013 Acquired absence of bilateral breasts and nipples: Secondary | ICD-10-CM | POA: Diagnosis not present

## 2017-12-08 DIAGNOSIS — Z853 Personal history of malignant neoplasm of breast: Secondary | ICD-10-CM | POA: Diagnosis not present

## 2017-12-08 DIAGNOSIS — Z79899 Other long term (current) drug therapy: Secondary | ICD-10-CM | POA: Insufficient documentation

## 2017-12-08 DIAGNOSIS — K9189 Other postprocedural complications and disorders of digestive system: Secondary | ICD-10-CM | POA: Diagnosis not present

## 2017-12-08 DIAGNOSIS — E119 Type 2 diabetes mellitus without complications: Secondary | ICD-10-CM | POA: Diagnosis not present

## 2017-12-08 DIAGNOSIS — Z9882 Breast implant status: Secondary | ICD-10-CM | POA: Insufficient documentation

## 2017-12-08 DIAGNOSIS — R002 Palpitations: Secondary | ICD-10-CM | POA: Diagnosis present

## 2017-12-08 LAB — I-STAT CHEM 8, ED
BUN: 6 mg/dL (ref 6–20)
CHLORIDE: 101 mmol/L (ref 101–111)
Calcium, Ion: 1.18 mmol/L (ref 1.15–1.40)
Creatinine, Ser: 0.6 mg/dL (ref 0.44–1.00)
GLUCOSE: 104 mg/dL — AB (ref 65–99)
HEMATOCRIT: 37 % (ref 36.0–46.0)
HEMOGLOBIN: 12.6 g/dL (ref 12.0–15.0)
POTASSIUM: 3.5 mmol/L (ref 3.5–5.1)
SODIUM: 141 mmol/L (ref 135–145)
TCO2: 30 mmol/L (ref 22–32)

## 2017-12-08 NOTE — ED Provider Notes (Signed)
[facility] DEPT Provider Note   CSN: [id number] Arrival date & time: 12/08/17  1920     History   Chief Complaint Chief Complaint  Patient presents with  . Post-op Problem  . Palpitations    HPI Sandra James is a 51 y.o. female.  HPI Pt has a history of breast cancer.  She had reconstructive surgery on Thursday.  Pt was doing well until today when she felt her heart skipping beats.  It is intermittent but frequent. No cp or sob.  No syncope.  She has been drinking some caffeinated beverages but nothing excessive.  Past Medical History:  Diagnosis Date  . Allergy   . Anxiety   . Breast cancer ([place]) 09/07/15   right breast  . Breast cancer of upper-outer quadrant of right female breast ([place]) 09/09/2015  . Cancer [facility])    left brast lumpectomy   . Complication of anesthesia    slow to wake up- BP was low, fainted next day-  . Depression   . Fibroid   . GERD (gastroesophageal reflux disease)   . Infection    UTI  . MVP (mitral valve prolapse)   . Neuromuscular disorder ([place])    sciatic nerve paion left side/leg  . UTI (lower urinary tract infection)     Patient Active Problem List   Diagnosis Date Noted  . Anxiety and depression 07/14/2017  . Chronic headaches 09/26/2016  . 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 DM type 2 (diabetes mellitus, type 2) ([place]) 02/17/2016  . Hypokalemia 02/02/2016  . Syncope 02/02/2016  . Genetic testing 10/08/2015  . History of left breast cancer 09/28/2015  . Family history of breast cancer in sister 09/28/2015  . Family history of colon cancer 09/28/2015  . Breast cancer of upper-outer quadrant of right female breast ([place]) 09/09/2015    Past Surgical History:  Procedure Laterality Date  . ABDOMINAL HYSTERECTOMY    . BREAST RECONSTRUCTION WITH PLACEMENT OF TISSUE EXPANDER AND FLEX HD (ACELLULAR HYDRATED DERMIS) Bilateral 11/25/2015   Procedure: BILATERAL IMMEDIATE BREAST  RECONSTRUCTION WITH PLACEMENT OF TISSUE EXPANDERS AND FLEX HD (ACELLULAR HYDRATED DERMIS);  Surgeon: Wallace Going, DO;  Location: [facility];  Service: Plastics;  Laterality: Bilateral;  . BREAST SURGERY  2004   left lumpectomy  . MASTECTOMY W/ SENTINEL NODE BIOPSY Bilateral 11/25/2015   Procedure: BILATERAL SKIN SPARING MASTECTOMIES WITH RIGHT SENTINEL LYMPH NODE BIOPSY;  Surgeon: Stark Klein, MD;  Location: [place];  Service: General;  Laterality: Bilateral;  . OOPHORECTOMY Bilateral   . PORT-A-CATH REMOVAL Left 05/29/2017   Procedure: PORT REMOVAL;  Surgeon: Stark Klein, MD;  Location: [facility];  Service: General;  Laterality: Left;  . PORTACATH PLACEMENT Left 12/20/2015   Procedure: INSERTION PORT-A-CATH, left chest;  Surgeon: Stark Klein, MD;  Location: [facility];  Service: General;  Laterality: Left;  . REMOVAL OF BILATERAL TISSUE EXPANDERS WITH PLACEMENT OF BILATERAL BREAST IMPLANTS Bilateral 12/06/2017   Procedure: REMOVAL OF BILATERAL TISSUE EXPANDERS WITH PLACEMENT OF BILATERAL SILCONE  BREAST IMPLANTS;  Surgeon: Wallace Going, DO;  Location: [facility];  Service: Plastics;  Laterality: Bilateral;    OB History    Gravida Para Term Preterm AB Living   2 2 1 1  0 2   SAB TAB Ectopic Multiple Live Births   0 0 0 0         Home Medications    Prior to Admission medications   Medication  Sig Start Date End Date Taking? Authorizing Provider  cephALEXin (KEFLEX) 500 MG capsule Take 500 mg by mouth 4 (four) times daily. 11/26/17  Yes [provider]  cholecalciferol (VITAMIN D) 1000 units tablet Take 1,000 Units by mouth daily.   Yes [provider]  citalopram (CELEXA) 20 MG tablet Take 1 tablet (20 mg total) by mouth daily. 11/29/17  Yes Truitt Merle, MD  exemestane (AROMASIN) 25 MG tablet TAKE 1 TABLET BY MOUTH DAILY AFTER BREAKFAST. 09/07/17  Yes Truitt Merle, MD  HYDROcodone-acetaminophen  (NORCO/VICODIN) 5-325 MG tablet Take 1 tablet by mouth every 6 (six) hours as needed for moderate pain.   Yes [provider]  ibuprofen (ADVIL,MOTRIN) 200 MG tablet Take 400 mg by mouth daily as needed for headache or moderate pain.    Yes [provider]  PRENATAL VIT-FE-FA-CA-DSS-DHA PO Take by mouth.   Yes [provider]  zolpidem (AMBIEN CR) 12.5 MG CR tablet Take 1 tablet (12.5 mg total) by mouth at bedtime as needed for sleep. 10/10/17  Yes Truitt Merle, MD    Family History Family History  Problem Relation Age of Onset  . Hypertension Mother   . Stroke Mother   . Alzheimer's disease Mother   . Heart disease Father   . Stroke Father   . Heart attack Father   . Breast cancer Sister 77       double mastectomy  . Other Sister        "stomach tumor that wrapped around reproductive organs"; dx. 42s; required partial hysterectomy  . Alzheimer's disease Maternal Grandmother   . Alzheimer's disease Paternal Grandmother   . Other Other        had to have lymph nodes removed and radiation; dx. 34s  . Cancer Maternal Aunt        unspecified type; dx. <50  . Colon cancer Maternal Uncle        dx. 66s  . Breast cancer Cousin        dx. 68s    Social History Social History   Tobacco Use  . Smoking status: Never Smoker  . Smokeless tobacco: Never Used  . Tobacco comment: previous secondhand smoke exposure  Substance Use Topics  . Alcohol use: Yes    Alcohol/week: 2.4 oz    Types: 4 Glasses of wine per week    Comment: 1 bottle of wine per wk  . Drug use: No     Allergies   Lisinopril; Oxycodone; and Percocet [oxycodone-acetaminophen]   Review of Systems Review of Systems  All other systems reviewed and are negative.    Physical Exam Updated Vital Signs BP 114/79   Pulse 75   Temp 98.5 F (36.9 C) (Oral)   Resp 16   SpO2 98%   Physical Exam  Constitutional: She appears well-developed and well-nourished. No distress.  HENT:  Head:  Normocephalic and atraumatic.  Right Ear: External ear normal.  Left Ear: External ear normal.  Eyes: Conjunctivae are normal. Right eye exhibits no discharge. Left eye exhibits no discharge. No scleral icterus.  Neck: Neck supple. No tracheal deviation present.  Cardiovascular: Normal rate, regular rhythm and intact distal pulses.  Pulmonary/Chest: Effort normal and breath sounds normal. No stridor. No respiratory distress. She has no wheezes. She has no rales.  Abdominal: Soft. Bowel sounds are normal. She exhibits no distension. There is no tenderness. There is no rebound and no guarding.  Musculoskeletal: She exhibits no edema or tenderness.  Neurological: She is alert.  She has normal strength. No cranial nerve deficit (no facial droop, extraocular movements intact, no slurred speech) or sensory deficit. She exhibits normal muscle tone. She displays no seizure activity. Coordination normal.  Skin: Skin is warm and dry. No rash noted.  Psychiatric: She has a normal mood and affect.  Nursing note and vitals reviewed.    ED Treatments / Results  Labs (all labs ordered are listed, but only abnormal results are displayed) Labs Reviewed  I-STAT CHEM 8, ED - Abnormal; Notable for the following components:      Result Value   Glucose, Bld 104 (*)    All other components within normal limits    EKG  EKG Interpretation  Date/Time:  Saturday December 08 2017 19:42:23 EST Ventricular Rate:  85 PR Interval:    QRS Duration: 88 QT Interval:  384 QTC Calculation: 457 R Axis:   78 Text Interpretation:  Sinus rhythm No significant change since last tracing Confirmed by Dorie Rank [phone]) on 12/08/2017 8:47:25 PM       Radiology No results found.  Procedures Procedures (including critical care time)  Medications Ordered in ED Medications - No data to display   Initial Impression / Assessment and Plan / ED Course  I have reviewed the triage vital signs and the nursing  notes.  Pertinent labs & imaging results that were available during my care of the patient were reviewed by me and considered in my medical decision making (see chart for details).  Clinical Course as of Dec 09 2303  Sat Dec 08, 2017  2102 PACs noted on the monitor while pt was feeling the palpitation  [JK]    Clinical Course User Index [JK] Dorie Rank, MD   Patient presented to the emergency room for evaluation of palpitations.  She had premature atrial contractions noted on her EKG and the monitor.  These correlated with her symptoms.  Patient was monitored in the emergency room.  No other ectopy noted.  Electrolytes are unremarkable.  Patient was feeling better by the end of her visit.  Discussed eliminating caffeine or other stimulant type drinks.  Follow-up with her primary care doctor  Final Clinical Impressions(s) / ED Diagnoses   Final diagnoses:  PAC (premature atrial contraction)    ED Discharge Orders    None       Dorie Rank, MD 12/08/17 2305

## 2017-12-08 NOTE — Discharge Instructions (Signed)
Avoid any caffeine use or energy drinks.  Follow-up with your primary care doctor if the symptoms persist.  Return as needed for worsening symptoms

## 2017-12-08 NOTE — ED Triage Notes (Signed)
Pt states she has breast cancer and had a bilateral mastectomy  Thursday she had implants placed  Pt states today she feels like her heart is racing and beating irregularly  Pt denies any chest pain  Pt states she has had some dizziness

## 2017-12-24 MED FILL — ZOLPIDEM TART ER 12.5 MG TA: 12.5 | 30 days supply | Qty: 30 | Fill #2

## 2017-12-25 MED FILL — CITALOPRAM HBR 20 MG TABLET: 20 | 30 days supply | Qty: 30 | Fill #1

## 2018-01-04 ENCOUNTER — Telehealth: Payer: Self-pay | Admitting: *Deleted

## 2018-01-04 NOTE — Telephone Encounter (Signed)
Pt called & reports that she has throbbing R axilla where lymph nodes were removed & this has been going on for @ 1 wk.  She reports that it can be constant for a good while & then eases up.  She has tried tylenol/ibuprofen & it helps some but throbbing comes right back.  She denies redness or swelling & states area is not sore to touch.  She states it feels like it is inside. Message to Dr Burr Medico.  Talked with Dr Burr Medico & pt reports no change in range of motion with R arm & she doesn't not feel any lumps or anything different.  Dr Burr Medico thinks it could be some neuropathy from previous surgery & suggested some heat & may add Vit B-6 & if persistent may add neurontin.  Pt expressed understanding & will get back with [place] if needed.

## 2018-01-16 MED FILL — EXEMESTANE 25 MG TABLET: 25 | 30 days supply | Qty: 30 | Fill #1

## 2018-01-28 ENCOUNTER — Other Ambulatory Visit: Payer: Self-pay | Admitting: Hematology

## 2018-01-28 DIAGNOSIS — Z170 Estrogen receptor positive status [ER+]: Principal | ICD-10-CM

## 2018-01-28 DIAGNOSIS — C50411 Malignant neoplasm of upper-outer quadrant of right female breast: Secondary | ICD-10-CM

## 2018-01-28 MED FILL — CITALOPRAM HBR 20 MG TABLET: 20 | 30 days supply | Qty: 30 | Fill #2

## 2018-01-29 MED FILL — ZOLPIDEM TART ER 12.5 MG TA: 12.5 | 30 days supply | Qty: 30 | Fill #0

## 2018-02-15 ENCOUNTER — Inpatient Hospital Stay: Payer: BLUE CROSS/BLUE SHIELD | Attending: Hematology

## 2018-02-15 ENCOUNTER — Telehealth: Payer: Self-pay | Admitting: *Deleted

## 2018-02-15 ENCOUNTER — Other Ambulatory Visit: Payer: Self-pay | Admitting: *Deleted

## 2018-02-15 DIAGNOSIS — G4700 Insomnia, unspecified: Secondary | ICD-10-CM | POA: Insufficient documentation

## 2018-02-15 DIAGNOSIS — Z923 Personal history of irradiation: Secondary | ICD-10-CM | POA: Diagnosis not present

## 2018-02-15 DIAGNOSIS — Z9223 Personal history of estrogen therapy: Secondary | ICD-10-CM | POA: Insufficient documentation

## 2018-02-15 DIAGNOSIS — K769 Liver disease, unspecified: Secondary | ICD-10-CM | POA: Diagnosis not present

## 2018-02-15 DIAGNOSIS — Z803 Family history of malignant neoplasm of breast: Secondary | ICD-10-CM | POA: Diagnosis not present

## 2018-02-15 DIAGNOSIS — K219 Gastro-esophageal reflux disease without esophagitis: Secondary | ICD-10-CM | POA: Insufficient documentation

## 2018-02-15 DIAGNOSIS — Z853 Personal history of malignant neoplasm of breast: Secondary | ICD-10-CM | POA: Diagnosis not present

## 2018-02-15 DIAGNOSIS — R232 Flushing: Secondary | ICD-10-CM | POA: Diagnosis not present

## 2018-02-15 DIAGNOSIS — M5136 Other intervertebral disc degeneration, lumbar region: Secondary | ICD-10-CM | POA: Diagnosis not present

## 2018-02-15 DIAGNOSIS — C50411 Malignant neoplasm of upper-outer quadrant of right female breast: Secondary | ICD-10-CM | POA: Diagnosis not present

## 2018-02-15 DIAGNOSIS — Z79899 Other long term (current) drug therapy: Secondary | ICD-10-CM | POA: Insufficient documentation

## 2018-02-15 DIAGNOSIS — Z9221 Personal history of antineoplastic chemotherapy: Secondary | ICD-10-CM | POA: Diagnosis not present

## 2018-02-15 DIAGNOSIS — Z9013 Acquired absence of bilateral breasts and nipples: Secondary | ICD-10-CM | POA: Insufficient documentation

## 2018-02-15 DIAGNOSIS — I341 Nonrheumatic mitral (valve) prolapse: Secondary | ICD-10-CM | POA: Diagnosis not present

## 2018-02-15 DIAGNOSIS — Z800 Family history of malignant neoplasm of digestive organs: Secondary | ICD-10-CM | POA: Diagnosis not present

## 2018-02-15 DIAGNOSIS — E119 Type 2 diabetes mellitus without complications: Secondary | ICD-10-CM | POA: Insufficient documentation

## 2018-02-15 DIAGNOSIS — Z79811 Long term (current) use of aromatase inhibitors: Secondary | ICD-10-CM | POA: Insufficient documentation

## 2018-02-15 DIAGNOSIS — F418 Other specified anxiety disorders: Secondary | ICD-10-CM | POA: Insufficient documentation

## 2018-02-15 DIAGNOSIS — R630 Anorexia: Secondary | ICD-10-CM | POA: Insufficient documentation

## 2018-02-15 DIAGNOSIS — F39 Unspecified mood [affective] disorder: Secondary | ICD-10-CM | POA: Diagnosis not present

## 2018-02-15 DIAGNOSIS — Z170 Estrogen receptor positive status [ER+]: Secondary | ICD-10-CM | POA: Diagnosis not present

## 2018-02-15 LAB — CMP (CANCER CENTER ONLY)
ALBUMIN: 4.5 g/dL (ref 3.5–5.0)
ALT: 15 U/L (ref 0–55)
AST: 16 U/L (ref 5–34)
Alkaline Phosphatase: 90 U/L (ref 40–150)
Anion gap: 10 (ref 3–11)
BUN: 8 mg/dL (ref 7–26)
CALCIUM: 10.1 mg/dL (ref 8.4–10.4)
CHLORIDE: 102 mmol/L (ref 98–109)
CO2: 30 mmol/L — AB (ref 22–29)
CREATININE: 0.8 mg/dL (ref 0.60–1.10)
GFR, Est AFR Am: >60 mL/min (ref >60)
GFR, Estimated: >60 mL/min (ref >60)
GLUCOSE: 78 mg/dL (ref 70–140)
Potassium: 3.7 mmol/L (ref 3.5–5.1)
SODIUM: 142 mmol/L (ref 136–145)
Total Bilirubin: 0.8 mg/dL (ref 0.2–1.2)
Total Protein: 8.4 g/dL — ABNORMAL HIGH (ref 6.4–8.3)

## 2018-02-15 LAB — CBC WITH DIFFERENTIAL (CANCER CENTER ONLY)
BASOS PCT: 1 %
Basophils Absolute: 0 10*3/uL (ref 0.0–0.1)
EOS ABS: 0.1 10*3/uL (ref 0.0–0.5)
Eosinophils Relative: 1 %
HCT: 40.6 % (ref 34.8–46.6)
Hemoglobin: 13.5 g/dL (ref 11.6–15.9)
LYMPHS ABS: 2.1 10*3/uL (ref 0.9–3.3)
Lymphocytes Relative: 29 %
MCH: 30.7 pg (ref 25.1–34.0)
MCHC: 33.2 g/dL (ref 31.5–36.0)
MCV: 92.5 fL (ref 79.5–101.0)
MONO ABS: 0.7 10*3/uL (ref 0.1–0.9)
MONOS PCT: 9 %
Neutro Abs: 4.4 10*3/uL (ref 1.5–6.5)
Neutrophils Relative %: 60 %
Platelet Count: 345 10*3/uL (ref 145–400)
RBC: 4.38 MIL/uL (ref 3.70–5.45)
RDW: 13.3 % (ref 11.2–14.5)
WBC Count: 7.3 10*3/uL (ref 3.9–10.3)

## 2018-02-15 NOTE — Telephone Encounter (Signed)
Pt called requesting a call back from nurse.  Spoke with pt and was informed that pt has been feeling sluggish, tired.   Denied fever, denied pain, eating and drinking fluids ok.   Wanted to know if she should come in for labs today.   Instructed pt to come in now for labs, and to wait for nurse in the lobby. Regan Rakers, NP reviewed lab results.   Spoke with pt in the lobby, and gave pt copy of lab results.  Pt in NO Distress. Stated she just feels the " Blah ".  Stated she is under stress since pt is moving back to [place] at end of month due to financial issues.  Wanted to be near family.    Informed pt that a scheduler will contact pt with appt with Dr. Burr Medico before moving.  Instructed pt to go to HIM to sign release of information form with the oncologist in [place] name, office address, phone and fax numbers so her records can be sent to [place].  Pt voiced understanding. Schedule message sent.

## 2018-02-18 ENCOUNTER — Telehealth: Payer: Self-pay | Admitting: Hematology

## 2018-02-18 ENCOUNTER — Ambulatory Visit: Payer: Self-pay

## 2018-02-18 NOTE — Telephone Encounter (Signed)
Spoke with patient regarding appointment per 3/8 sch msg

## 2018-02-25 MED FILL — EXEMESTANE 25 MG TABLET: 25 | 30 days supply | Qty: 30 | Fill #2

## 2018-02-25 MED FILL — ZOLPIDEM TART ER 12.5 MG TA: 12.5 | 30 days supply | Qty: 30 | Fill #1

## 2018-02-26 NOTE — Progress Notes (Signed)
[place]  [phone] [phone]  Clinic follow Up Note   Patient Care Team: Patient, No Pcp Per as PCP - General ([place]) Stark Klein, MD as Consulting Physician (General Surgery) Truitt Merle, MD as Consulting Physician (Hematology) Mauro Kaufmann, RN as Registered Nurse Rockwell Germany, RN as Registered Nurse Causey, Charlestine Massed, NP as Nurse Practitioner (Hematology and Oncology) Kyung Rudd, MD as Consulting Physician (Radiation Oncology)   Date of Service:  02/28/2018  CHIEF COMPLAINTS:  follow up of Right Breast cancer  Oncology History   Cancer Staging Breast cancer of upper-outer quadrant of right female breast [facility]) Staging form: Breast, AJCC 7th Edition - Clinical stage from 09/15/2015: Stage IA (T1b, N0, M0) - Signed by Truitt Merle, MD on 12/16/2015 - Pathologic stage from 11/25/2015: Stage IIA (T1c, N1a, cM0) - Signed by Truitt Merle, MD on 12/16/2015       Breast cancer of upper-outer quadrant of right female breast ([place])   09/07/2015 Mammogram    Diagnostic mammogram and the ultrasound of the right breast showed a 0.8 cm lobulated mass in the right breast 11 to 12:00 position 9 cm from the nipple. No other lesions or adenopathy.      09/08/2015 Initial Diagnosis    Breast cancer of upper-outer quadrant of right female breast ([place])      09/08/2015 Initial Biopsy    Right breast mass at the upper outer quadrant core needle biopsy showed invasive ductal carcinoma, grade 2-3, ductal carcinoma in situ.      09/08/2015 Receptors her2    ER 100% positive, PR 100% positive, Ki-67 60%, HER-2 positive with copy # 4.25 and ratio 2.58      09/28/2015 Genetic Testing    Genetic testing was normal, and did not reveal a deleterious mutation in these genes.  Additionally, no variants of uncertain significance (VUSes) were found.  Genes tested include: ATM, BARD1, BRCA1, BRCA2, BRIP1, CDH1, CHEK2,  FANCC, MLH1, MSH2, MSH6, NBN,  PALB2, PMS2, PTEN, RAD51C, RAD51D, TP53, and XRCC2.  This panel also includes deletion/duplication analysis (without sequencing) for one gene, EPCAM.      11/25/2015 Surgery    Right breast mastectomy and sentinel lymph node biopsy      11/25/2015 Pathology Results    Right breast invasive ductal carcinoma, grade 2, 2.0 cm, DCIS, intermediate grade, (+) LVI, margins were negative, 1 out of 3 lymph nodes were negative. Left breast ostectomy was negative for malignancy.      01/06/2016 - 04/20/2016 Chemotherapy    adjuvant chemotherapy TCH P, every 3 weeks, for a total of 6 cycles. Herceptin and Perjeta were held for last two cycles due to drop of her EF on echo       05/25/2016 - 07/11/2016 Radiation Therapy    Adjuvant breast radiation [facility]): 1. The Right chest wall (4-field) was treated to 50.4 Gy in 28 fractions at 1.8 Gy per fraction. 2. The Right chest wall was boosted to 10 Gy in 5 fractions at 2 Gy per fraction.  3. The Right supraclavicular region was treated to 50.4 Gy in 28 fractions at 1.8 Gy per fraction.      06/08/2016 - 03/08/2017 Chemotherapy    maintenance Herceptin every 3 weeks, after clearance from cardiology.      08/11/2016 -  Anti-estrogen oral therapy    Exemestane 25 mg daily      11/13/2016 Imaging    Bone Scan IMPRESSION: 1. No evidence for metastatic disease. 2. Lumbar  spine degenerative disc disease.      11/13/2016 Imaging    CT Abdomen W Contrast IMPRESSION: 1.8 cm hypervascular lesion in segment 6, unchanged. Given stability, a benign etiology such as FNH or flash filling hemangioma is favored. Metastasis is difficult to exclude but is considered less likely. 1.9 cm lesion in segment 3, compatible with a benign hemangioma. No findings specific for metastatic disease in the abdomen. Please note that the pelvis was not imaged.      11/13/2016 Imaging    CT Head W Contrast IMPRESSION: Negative CT head with contrast.      01/02/2017 Imaging    Echo  01/02/2017 Impressions: - Compared to a prior study in 07/2016, the LVEF is stable however,   LV strain is worse at -15% with inferolateral strain abnormality.      12/06/2017 Surgery    REMOVAL OF BILATERAL TISSUE EXPANDERS WITH PLACEMENT OF BILATERAL SILCONE BREAST IMPLANTS by Dr. Marla Roe       HISTORY OF PRESENTING ILLNESS:  Sandra James 52 y.o. female with past medical history of stage I left breast cancer, is here because of newly diagnosed right breast cancer. She presents to our multidisciplinary breast clinic by herself.  The right breast cancer was discovered by screening mammogram. She did not have any palpable mass, or any constitutional symptoms. She was diagnosed with stage I left breast cancer at age of 18, she had lumpectomy, radiation, and adjuvant chemotherapy and 5 years of tamoxifen. She was treated by Dr. Louann Sjogren in [place]. She moved to [facility] about year ago due to job change. She has been very compliant with annual screening mammogram.  She had hysterectomy and bilateral oophorectomy and sphincterectomy 5 years ago for heavy bleeding.. She has been having hot flashes since then, moderate, but manageable. She is single, lives alone, works for 2 to [place] has to go up children who live in [place].  CURRENT THERAPY: Exemestane 25 mg daily, started on 08/11/2016  INTERIM HISTORY  Sandra James is here for a follow up.  Of note since her visit she underwent breast reconstruction by Dr. Marla Roe on 12/06/17. She also presented to the ED on 12/08/17 for Premature arterial contraction.  She presents to the clinic today noting she is doing fine with some tired days but still able to get things done. She plans to move back to [place] next Saturday soon and is stressed about that. She plans to stay with her daughter and grandchildren. She plans to see prior physician for Breast cancer follow up.   On review of symptoms, pt notes her head will feel  as if it has dropped in position. This was happening before her move to [place] and occurs occasionally. She notes her headaches have improved. She has gained weight between visits. She has manageable hot flashes and joint pain. She is tolerating exemestane overall well. She notes a knot in her right breast.     MEDICAL HISTORY:  Past Medical History:  Diagnosis Date  . Allergy   . Anxiety   . Breast cancer ([place]) 09/07/15   right breast  . Breast cancer of upper-outer quadrant of right female breast ([place]) 09/09/2015  . Cancer [facility])    left brast lumpectomy   . Complication of anesthesia    slow to wake up- BP was low, fainted next day-  . Depression   . Fibroid   . GERD (gastroesophageal reflux disease)   . Infection    UTI  .  MVP (mitral valve prolapse)   . Neuromuscular disorder ([place])    sciatic nerve paion left side/leg  . UTI (lower urinary tract infection)     SURGICAL HISTORY: Past Surgical History:  Procedure Laterality Date  . ABDOMINAL HYSTERECTOMY    . BREAST RECONSTRUCTION WITH PLACEMENT OF TISSUE EXPANDER AND FLEX HD (ACELLULAR HYDRATED DERMIS) Bilateral 11/25/2015   Procedure: BILATERAL IMMEDIATE BREAST RECONSTRUCTION WITH PLACEMENT OF TISSUE EXPANDERS AND FLEX HD (ACELLULAR HYDRATED DERMIS);  Surgeon: Wallace Going, DO;  Location: [place];  Service: Plastics;  Laterality: Bilateral;  . BREAST SURGERY  2004   left lumpectomy  . MASTECTOMY W/ SENTINEL NODE BIOPSY Bilateral 11/25/2015   Procedure: BILATERAL SKIN SPARING MASTECTOMIES WITH RIGHT SENTINEL LYMPH NODE BIOPSY;  Surgeon: Stark Klein, MD;  Location: [place];  Service: General;  Laterality: Bilateral;  . OOPHORECTOMY Bilateral   . PORT-A-CATH REMOVAL Left 05/29/2017   Procedure: PORT REMOVAL;  Surgeon: Stark Klein, MD;  Location: [place];  Service: General;  Laterality: Left;  . PORTACATH PLACEMENT Left 12/20/2015   Procedure: INSERTION  PORT-A-CATH, left chest;  Surgeon: Stark Klein, MD;  Location: [place];  Service: General;  Laterality: Left;  . REMOVAL OF BILATERAL TISSUE EXPANDERS WITH PLACEMENT OF BILATERAL BREAST IMPLANTS Bilateral 12/06/2017   Procedure: REMOVAL OF BILATERAL TISSUE EXPANDERS WITH PLACEMENT OF BILATERAL SILCONE  BREAST IMPLANTS;  Surgeon: Wallace Going, DO;  Location: [place];  Service: Plastics;  Laterality: Bilateral;    SOCIAL HISTORY: Social History   Social History  . Marital Status: Single     Spouse Name: N/A  . Number of Children: 2 children, 80 daughter and 77 yo son    . Years of Education: N/A   Occupational History  . She is a Barista rep   Social History Main Topics  . Smoking status: Never Smoker   . Smokeless tobacco: Never Used  . Alcohol Use: Yes     Comment: 2-3/wk  . Drug Use: No  . Sexual Activity: Yes    Birth Control/ Protection: Surgical   Other Topics Concern  . Not on file   Social History Narrative    FAMILY HISTORY: Family History  Problem Relation Age of Onset  . Hypertension Mother   . Stroke Mother   . Alzheimer's disease Mother   . Heart disease Father   . Stroke Father   . Heart attack Father   . Breast cancer Sister 60       double mastectomy  . Other Sister        "stomach tumor that wrapped around reproductive organs"; dx. 54s; required partial hysterectomy  . Alzheimer's disease Maternal Grandmother   . Alzheimer's disease Paternal Grandmother   . Other Other        had to have lymph nodes removed and radiation; dx. 45s  . Cancer Maternal Aunt        unspecified type; dx. <50  . Colon cancer Maternal Uncle        dx. 14s  . Breast cancer Cousin        dx. 69s    ALLERGIES:  is allergic to lisinopril; oxycodone; and percocet [oxycodone-acetaminophen].  MEDICATIONS:  Current Outpatient Medications  Medication Sig Dispense Refill  . cholecalciferol (VITAMIN D) 1000 units tablet Take 1,000 Units by mouth  daily.    . citalopram (CELEXA) 20 MG tablet Take 1 tablet (20 mg total) by mouth daily. 30 tablet 2  . exemestane (  AROMASIN) 25 MG tablet TAKE 1 TABLET BY MOUTH DAILY AFTER BREAKFAST. 30 tablet 2  . HYDROcodone-acetaminophen (NORCO/VICODIN) 5-325 MG tablet Take 1 tablet by mouth every 6 (six) hours as needed for moderate pain.    Marland Kitchen ibuprofen (ADVIL,MOTRIN) 200 MG tablet Take 400 mg by mouth daily as needed for headache or moderate pain.     Marland Kitchen PRENATAL VIT-FE-FA-CA-DSS-DHA PO Take by mouth.    . zolpidem (AMBIEN CR) 12.5 MG CR tablet TAKE 1 TABLET BY MOUTH AT BEDTIME AS NEEDED FOR SLEEP 30 tablet 2   No current facility-administered medications for this visit.     REVIEW OF SYSTEMS:   Constitutional: Denies fevers, chills or abnormal night sweats (+) hot flashes, manageable  (+) weight gain (+) improved headaches Eyes: Denies blurriness of vision, double vision or watery eyes Ears, nose, mouth, throat, and face: Denies mucositis or sore throat Respiratory: Denies cough, dyspnea or wheezes Cardiovascular: Denies palpitation, chest discomfort or lower extremity swelling Gastrointestinal:  Denies nausea, heartburn  Skin: Denies abnormal skin rashes Lymphatics: Denies easy bruising  Neurological:Denies numbness, tingling or new weaknesses MSK: (+) joint stiffness in her hands and soreness in feet, manageable  Behavioral/Psych: (+) anxiety, improved  All other systems were reviewed with the patient and are negative.  PHYSICAL EXAMINATION:  ECOG PERFORMANCE STATUS: 1 BP 103/70 (BP Location: Left Arm, Patient Position: Sitting)   Pulse 92   Temp 98.3 F (36.8 C) (Oral)   Resp 17   Ht 5' 6" (1.676 m)   Wt 154 lb 3.2 oz (69.9 kg)   SpO2 99%   BMI 24.89 kg/m   GENERAL:alert, no distress and comfortable SKIN: skin color, texture, turgor are normal, no rashes or significant lesions EYES: normal, conjunctiva are pink and non-injected, sclera clear, eye lids appears normal OROPHARYNX:no  exudate, no erythema and lips, buccal mucosa, and tongue normal  NECK: supple, thyroid normal size, non-tender, without nodularity LYMPH:  no palpable lymphadenopathy in the cervical, axillary or inguinal LUNGS: clear to auscultation and percussion with normal breathing effort HEART: regular rate & rhythm and no murmurs and no lower extremity edema ABDOMEN:abdomen soft, non-tender and normal bowel sounds Musculoskeletal:no cyanosis of digits and no clubbing  PSYCH: alert & oriented x 3 with fluent speech NEURO: no focal motor/sensory deficits Breasts: s/p bilateral mastectomy and reconstruction with inplants. Surgical incision sites are clean and well healed. (+) a small nodule in the media of right breast, possible related to implant to reconstruction surgery. Left breast exam benign, no other palpable mass or adenopathy.    LABORATORY DATA:  I have reviewed the data as listed CBC Latest Ref Rng & Units 02/28/2018 02/15/2018 12/08/2017  WBC 3.9 - 10.3 K/uL 5.8 7.3 -  Hemoglobin 11.6 - 15.9 g/dL 12.8 - 12.6  Hematocrit 34.8 - 46.6 % 38.8 40.6 37.0  Platelets 145 - 400 K/uL 324 345 -    CMP Latest Ref Rng & Units 02/28/2018 02/15/2018 12/08/2017  Glucose 70 - 140 mg/dL 109 78 104(H)  BUN 7 - 26 mg/dL _0 Creatinine 0.60 - 1.10 mg/dL 0.80 0.80 0.60  Sodium 136 - 145 mmol/L 140 142 141  Potassium 3.5 - 5.1 mmol/L 3.9 3.7 3.5  Chloride 98 - 109 mmol/L 104 102 101  CO2 22 - 29 mmol/L 27 30(H) -  Calcium 8.4 - 10.4 mg/dL 10.1 10.1 -  Total Protein 6.4 - 8.3 g/dL 7.8 8.4(H) -  Total Bilirubin 0.2 - 1.2 mg/dL 1.0 0.8 -  Alkaline Phos  40 - 150 U/L 78 90 -  AST 5 - 34 U/L 17 16 -  ALT 0 - 55 U/L 16 15 -     Pathology report Diagnosis 11/25/2015 1. Breast, simple mastectomy, right - INVASIVE DUCTAL CARCINOMA, GRADE 2/3, SPANNING 2.0 CM. - DUCTAL CARCINOMA IN SITU, INTERMEDIATE GRADE. - LYMPHOVASCULAR INVASION IS IDENTIFIED. - LOBULAR NEOPLASIA (ATYPICAL LOBULAR HYPERPLASIA). - THE  SURGICAL RESECTION MARGINS ARE NEGATIVE FOR CARCINOMA. - SEE ONCOLOGY TADifficulty concentrationBLE DifficultyBELOW. 2. Lymph node, sentinel, biopsy, right axilloss,lary #1 - ME loss,TASTATIC CARCINOMA IN 1 OF 1 LYMPH NODE (1/1). 3. Lymph node, sentinel, biopsy, right axillary #2 - THERE IS NO EVIDENCE OF CARCINOMA IN 1 OF 1 LYMPH NODE (0/1). 4. Lymph node, sentinel, biopsy, right axillary #3 - THERE IS NO EVIDENCE OF CARCINOMA IN 1 OF 1 LYMPH NODE (0/1). 5. Breast, simple mastectomy, left - BENIGN BREAST PARENCHYMA WITH DENSE STROMAL FIBROSIS. - FIBROADENOMA. - THERE IS NO EVIDENCE OF MALIGNANCY. 6. Breast, excision, left, additional lateral margin - BENIGN FIBROADIPOSE TISSUE. - THERE IS NO EVIDENCE OF MALIGNANCY. - SEE COMMENT. Microscopic Comment 1. BREAST, INVASIVE TUMOR, WITH LYMPH NODES PRESENT Specimen, including laterality and lymph node sampling (sentinel, non-sentinel): Right breast and right axillary lymph nodes Procedure: Bilateral mastectomy and multiple right axillary lymph node resections Histologic type: Ductal Grade: 2 Tubule formation: 2 1 of 4 FINAL for Arenz, [place] ([id number]) Microscopic Comment(continued) Nuclear pleomorphism: 2 Mitotic: 2 Tumor size (gross measurement): 2.0 cm Margins: Negative for carcinoma Invasive, distance to closest margin: 1.8 cm to the posterior margin In-situ, distance to closest margin: 1.8 cm to the posterior margin Lymphovascular invasion: Present Ductal carcinoma in situ: Present Grade: Intermediate grade Extensive intraductal component: Not identified Lobular neoplasia: Present, atypical lobular hyperplasia Tumor focality: Unifocal Treatment effect: Not identified Extent of tumor: Confined to breast parenchyma Lymph nodes: Examined: 3 Sentinel 0 Non-sentinel 3 Total Lymph nodes with metastasis: 1 (macrometastasis) - no extracapsular extension is identified. Breast prognostic profile: [phone] Estrogen  receptor: 100%, strong staining intensity Progesterone receptor: 100%, strong staining intensity Her 2 neu: Amplification was detected. The ratio was 2.58 Ki-67: 60% TNM: pT1c, pN1a Non-neoplastic breast: No significant findings. 6. The surgical resection margin(s) of the specimen were inked and microscopically evaluated. Enid Cutter MD Pathologist, Electronic Signature (Case signed 11/29/2015)   RADIOGRAPHIC STUDIES: I have personally reviewed the radiological images as listed and agreed with the findings in the report.  Echo 01/02/2017 Impressions: - Compared to a prior study in 07/2016, the LVEF is stable however,   LV strain is worse at -15% with inferolateral strain abnormality.  Bone scan 11/13/2016 IMPRESSION: IMPRESSION: 1. No evidence for metastatic disease. 2. Lumbar spine degenerative disc disease.   CT abdomen and pelvis w contrast 11/13/2016 IMPRESSION: 1.8 cm hypervascular lesion in segment 6, unchanged. Given stability, a benign etiology such as FNH or flash filling hemangioma is favored. Metastasis is difficult to exclude but is considered less likely.  1.9 cm lesion in segment 3, compatible with a benign hemangioma.  No findings specific for metastatic disease in the abdomen. Please note that the pelvis was not imaged.   CT head w wo contrast 11/13/2016 IMPRESSION: Negative CT head with contrast.  ECHO 01/02/17  Impressions: - Compared to a prior study in 07/2016, the LVEF is stable however,   LV strain is worse at -15% with inferolateral strain abnormality.  ECHO 08/02/2016 Impressions: - Normal LV size with EF 55%. Strain as noted above. Normal RV size   and systolic  function.  ASSESSMENT & PLAN:  52 y.o. African-American female, surgical postmenopausal, history of stage I left breast cancer, with newly diagnosed right breast cancer.  1. Right breast invasive ductal carcinoma, pT1cN1aM0, stage IIB, grade 2, ER100%+/PR100%+/HER2+ -I previously  reviewed her surgical pathology results with patient in great details. She has 1 out of 3 sentinel lymph nodes positive, her pathological stage is more advanced than her initial clinical stage.  -We reviewed the natural history of triple positive breast cancer. HER-2 positive tumors are more progressive, with higher risk of recurrence -I reviewed her repeated CT and bone scan images from 11/13/2016 with pt in detail, which are negative for metastatic disease. She has 2 small liver lesions, likely benign, unchanged. Unfortunately she is not able to do liver MRI, due to her breast implants.  -She has completed adjuvant chemotherapy TCHP, Herceptin and perjeta were held for the last 2 cycle due to her decreased EF, she has completed maintenance Herceptin and perjeta in March 2018.  -Her repeated echo has showed normal EF. -She  has completed adjuvant breast and axilla radiation and tolerated well. -She previously could not tolerate letrozole and it was switched to exemestane, she tolerates it much better, still has severe hot flash, will continue for 5-7 years.  -she tried neratinib, a recently FDA approved oral her2 antibody, but could not tolerate due to side effects. She has stopped it.  -We reviewed to breast cancer surveillance. She is status post bilateral mastectomy, no need to routine mammogram. I previously encouraged her to do self exam. She'll follow-up with [place] every 3-6 months for labs and exam. -Port was removed 05/29/17 -She underwent breast reconstruction on 12/06/17 by Dr. Marla Roe.  -She had experienced right arm lymph edema. I previously suggested she follow up with her PT about changing her compression sleeve or review of certain exercises to decrease this.  -She is clinically doing well. Lab reviewed, her CBC and CMP are within normal limits. Her physical exam was unremarkable except a very small nodule in right media breast, likely fat necrosis. I suggest she see Dr. Marla Roe soon  about this. I will send him a message. Clinically there is no clinical concern for recurrence. -Given her bilateral mastectomy she will not need regular mammograms.  -She will be moving back to [place] this month. Clinic will send her medical records to next oncologist. She has signed release of medical records.   2. Anxiety and depression  -She reports anxiety attack, an overall depressed mood since she completed her major breast cancer treatment. -She also has some cognitive dysfunction when she is very anxious, she was seen for this a few weeks ago. -She did not feel better after counseling with our Education officer, museum and chaplain previously, but needs some medication for her anxiety attack. -I previously recommended her to consider SSRI for her depression and anxiety. She tried Effexor before, but tolerated poorly  -I previously prescribed a small prescription of Xanax, to use as needed for panic attack. 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 She agreed.  -I also encouraged her to exercise routinely, such as yoga, and meditation. -While on Celexa her anxiety and depression has much improved, continue Celexa  -Given her worsening hot flashes I increased her Celexa to 53m daily on 11/29/17 -Well managed  3. Genetics -Due to her young age and recurrent breast cancer, she was referred to see a genetic counselor in our cancer center. -Her genetic test was  negative  4. DM -she will continue follow-up with her primary care physician -She is on metformin -BG controlled  5. Bone health  -She is postmenopausal by surgery - her bone density scan from December 2017 was normal, - I previously reviewed the CT scan and bone density scan with her. I previously recommenced her to see a  Orthopedic surgeon.  -I encouraged her to take vitamin D and calcium, along with drinking more water. She is compliant -Next bone density scan due 11/2018  6. Insomnia  and low appetite  - improved some, continue mirtazapine -previously has not been able to wean off Ambien, will continue for now -Insomnia is managed with Ambien -she has been gaining weight between visit. I encouraged her to be active and maintain a healthy balanced diet.   7. Hot flashes -Secondary to menopause and exemestane, improved, manageable- -she previously tried Effexor, didn't feel helpful.   -Controlled and manageable with Celexa 77m daily.  8. Mood swing   - I have previously advised the patient that her exemestane could be causing her anxious and agitated moods. Patient says she can handle it for now. If it gets out of hand, she will call me. -She previously tried Effexor, did not tolerate well.  - I have previously advised the patient to exercise, she plans to try yoga or meditation -she is on Celexa now, tolerating well and mood much improved.    Plan for today  -Continue Exemestane -[place] and we will send her medical records over. -she will see Dr. DMarla Roenext week    All questions were answered. The patient knows to call the clinic with any problems, questions or concerns.  I spent 20 minutes counseling the patient face to face. The total time spent in the appointment was 25 minutes and more than 50% was on counseling.  This document serves as a record of services personally performed by YTruitt Merle MD. It was created on her behalf by AJoslyn Devon a trained medical scribe. The creation of this record is based on the scribe's personal observations and the provider's statements to them.   I have reviewed the above documentation for accuracy and completeness, and I agree with the above.     YTruitt Merle 02/28/2018 2:22 PM

## 2018-02-28 ENCOUNTER — Other Ambulatory Visit: Payer: Self-pay | Admitting: Hematology

## 2018-02-28 ENCOUNTER — Inpatient Hospital Stay: Payer: BLUE CROSS/BLUE SHIELD

## 2018-02-28 ENCOUNTER — Encounter: Payer: Self-pay | Admitting: Hematology

## 2018-02-28 ENCOUNTER — Inpatient Hospital Stay (HOSPITAL_BASED_OUTPATIENT_CLINIC_OR_DEPARTMENT_OTHER): Payer: BLUE CROSS/BLUE SHIELD | Admitting: Hematology

## 2018-02-28 VITALS — BP 103/70 | HR 92 | Temp 98.3°F | Resp 17 | Ht 66.0 in | Wt 154.2 lb

## 2018-02-28 DIAGNOSIS — Z853 Personal history of malignant neoplasm of breast: Secondary | ICD-10-CM | POA: Diagnosis not present

## 2018-02-28 DIAGNOSIS — R630 Anorexia: Secondary | ICD-10-CM

## 2018-02-28 DIAGNOSIS — C50411 Malignant neoplasm of upper-outer quadrant of right female breast: Secondary | ICD-10-CM

## 2018-02-28 DIAGNOSIS — M5136 Other intervertebral disc degeneration, lumbar region: Secondary | ICD-10-CM

## 2018-02-28 DIAGNOSIS — Z923 Personal history of irradiation: Secondary | ICD-10-CM

## 2018-02-28 DIAGNOSIS — Z9013 Acquired absence of bilateral breasts and nipples: Secondary | ICD-10-CM

## 2018-02-28 DIAGNOSIS — E119 Type 2 diabetes mellitus without complications: Secondary | ICD-10-CM

## 2018-02-28 DIAGNOSIS — F32A Depression, unspecified: Secondary | ICD-10-CM

## 2018-02-28 DIAGNOSIS — R232 Flushing: Secondary | ICD-10-CM | POA: Diagnosis not present

## 2018-02-28 DIAGNOSIS — Z79811 Long term (current) use of aromatase inhibitors: Secondary | ICD-10-CM | POA: Diagnosis not present

## 2018-02-28 DIAGNOSIS — Z170 Estrogen receptor positive status [ER+]: Secondary | ICD-10-CM

## 2018-02-28 DIAGNOSIS — Z803 Family history of malignant neoplasm of breast: Secondary | ICD-10-CM

## 2018-02-28 DIAGNOSIS — F39 Unspecified mood [affective] disorder: Secondary | ICD-10-CM

## 2018-02-28 DIAGNOSIS — K769 Liver disease, unspecified: Secondary | ICD-10-CM

## 2018-02-28 DIAGNOSIS — Z9221 Personal history of antineoplastic chemotherapy: Secondary | ICD-10-CM | POA: Diagnosis not present

## 2018-02-28 DIAGNOSIS — Z800 Family history of malignant neoplasm of digestive organs: Secondary | ICD-10-CM

## 2018-02-28 DIAGNOSIS — G4700 Insomnia, unspecified: Secondary | ICD-10-CM | POA: Diagnosis not present

## 2018-02-28 DIAGNOSIS — F418 Other specified anxiety disorders: Secondary | ICD-10-CM

## 2018-02-28 DIAGNOSIS — K219 Gastro-esophageal reflux disease without esophagitis: Secondary | ICD-10-CM

## 2018-02-28 DIAGNOSIS — Z79899 Other long term (current) drug therapy: Secondary | ICD-10-CM

## 2018-02-28 DIAGNOSIS — Z9223 Personal history of estrogen therapy: Secondary | ICD-10-CM

## 2018-02-28 DIAGNOSIS — I341 Nonrheumatic mitral (valve) prolapse: Secondary | ICD-10-CM

## 2018-02-28 DIAGNOSIS — F419 Anxiety disorder, unspecified: Secondary | ICD-10-CM

## 2018-02-28 DIAGNOSIS — F329 Major depressive disorder, single episode, unspecified: Secondary | ICD-10-CM

## 2018-02-28 LAB — CBC WITH DIFFERENTIAL/PLATELET
BASOS ABS: 0 10*3/uL (ref 0.0–0.1)
Basophils Relative: 1 %
Eosinophils Absolute: 0.1 10*3/uL (ref 0.0–0.5)
Eosinophils Relative: 1 %
HEMATOCRIT: 38.8 % (ref 34.8–46.6)
Hemoglobin: 12.8 g/dL (ref 11.6–15.9)
LYMPHS PCT: 24 %
Lymphs Abs: 1.4 10*3/uL (ref 0.9–3.3)
MCH: 30.4 pg (ref 25.1–34.0)
MCHC: 33 g/dL (ref 31.5–36.0)
MCV: 92.3 fL (ref 79.5–101.0)
MONO ABS: 0.5 10*3/uL (ref 0.1–0.9)
Monocytes Relative: 8 %
NEUTROS ABS: 3.8 10*3/uL (ref 1.5–6.5)
Neutrophils Relative %: 66 %
PLATELETS: 324 10*3/uL (ref 145–400)
RBC: 4.2 MIL/uL (ref 3.70–5.45)
RDW: 13.6 % (ref 11.2–14.5)
WBC: 5.8 10*3/uL (ref 3.9–10.3)

## 2018-02-28 LAB — COMPREHENSIVE METABOLIC PANEL
ALBUMIN: 4.2 g/dL (ref 3.5–5.0)
ALT: 16 U/L (ref 0–55)
AST: 17 U/L (ref 5–34)
Alkaline Phosphatase: 78 U/L (ref 40–150)
Anion gap: 9 (ref 3–11)
BILIRUBIN TOTAL: 1 mg/dL (ref 0.2–1.2)
BUN: 7 mg/dL (ref 7–26)
CHLORIDE: 104 mmol/L (ref 98–109)
CO2: 27 mmol/L (ref 22–29)
CREATININE: 0.8 mg/dL (ref 0.60–1.10)
Calcium: 10.1 mg/dL (ref 8.4–10.4)
GFR calc Af Amer: >60 mL/min (ref >60)
GFR calc non Af Amer: >60 mL/min (ref >60)
GLUCOSE: 109 mg/dL (ref 70–140)
POTASSIUM: 3.9 mmol/L (ref 3.5–5.1)
Sodium: 140 mmol/L (ref 136–145)
Total Protein: 7.8 g/dL (ref 6.4–8.3)

## 2018-02-28 MED FILL — CITALOPRAM HBR 20 MG TABLET: 20 | 30 days supply | Qty: 30 | Fill #0

## 2018-04-01 ENCOUNTER — Other Ambulatory Visit: Payer: Self-pay

## 2018-04-01 ENCOUNTER — Ambulatory Visit: Payer: Self-pay | Admitting: Hematology

## 2018-04-02 ENCOUNTER — Inpatient Hospital Stay: Payer: Self-pay | Attending: Hematology

## 2018-04-02 ENCOUNTER — Inpatient Hospital Stay: Payer: Self-pay | Admitting: Hematology

## 2019-05-19 ENCOUNTER — Telehealth: Payer: Self-pay | Admitting: *Deleted

## 2019-05-19 NOTE — Telephone Encounter (Signed)
MR faxed to [facility] - Release:  [id number]

## 2023-08-21 ENCOUNTER — Other Ambulatory Visit: Payer: Self-pay | Admitting: Pharmacist
